# Patient Record
Sex: Female | Born: 1950
Health system: Southern US, Community
[De-identification: ages and names within clinical notes are randomized; demographics above are authoritative.]

## PROBLEM LIST (undated history)

## (undated) DIAGNOSIS — Z9221 Personal history of antineoplastic chemotherapy: Secondary | ICD-10-CM

## (undated) DIAGNOSIS — M1990 Unspecified osteoarthritis, unspecified site: Secondary | ICD-10-CM

## (undated) DIAGNOSIS — Z8619 Personal history of other infectious and parasitic diseases: Secondary | ICD-10-CM

## (undated) DIAGNOSIS — T7840XA Allergy, unspecified, initial encounter: Secondary | ICD-10-CM

## (undated) DIAGNOSIS — C50919 Malignant neoplasm of unspecified site of unspecified female breast: Secondary | ICD-10-CM

## (undated) DIAGNOSIS — Z923 Personal history of irradiation: Secondary | ICD-10-CM

## (undated) HISTORY — PX: EYE SURGERY: SHX253

## (undated) HISTORY — DX: Personal history of other infectious and parasitic diseases: Z86.19

## (undated) HISTORY — DX: Allergy, unspecified, initial encounter: T78.40XA

## (undated) HISTORY — PX: DILATION AND CURETTAGE OF UTERUS: SHX78

## (undated) HISTORY — DX: Unspecified osteoarthritis, unspecified site: M19.90

## (undated) HISTORY — PX: WISDOM TOOTH EXTRACTION: SHX21

---

## 1998-03-10 ENCOUNTER — Encounter: Admission: RE | Admit: 1998-03-10 | Discharge: 1998-03-10 | Payer: Self-pay | Admitting: Family Medicine

## 2001-02-27 ENCOUNTER — Other Ambulatory Visit: Admission: RE | Admit: 2001-02-27 | Discharge: 2001-02-27 | Payer: Self-pay | Admitting: *Deleted

## 2002-04-23 ENCOUNTER — Other Ambulatory Visit: Admission: RE | Admit: 2002-04-23 | Discharge: 2002-04-23 | Payer: Self-pay | Admitting: *Deleted

## 2003-06-03 ENCOUNTER — Other Ambulatory Visit: Admission: RE | Admit: 2003-06-03 | Discharge: 2003-06-03 | Payer: Self-pay | Admitting: *Deleted

## 2004-12-28 ENCOUNTER — Ambulatory Visit (HOSPITAL_COMMUNITY): Admission: RE | Admit: 2004-12-28 | Discharge: 2004-12-28 | Payer: Self-pay | Admitting: Gastroenterology

## 2011-09-03 ENCOUNTER — Encounter: Payer: Self-pay | Admitting: Internal Medicine

## 2011-09-03 ENCOUNTER — Ambulatory Visit (INDEPENDENT_AMBULATORY_CARE_PROVIDER_SITE_OTHER): Payer: BC Managed Care – PPO | Admitting: Internal Medicine

## 2011-09-03 VITALS — BP 118/78 | HR 107 | Temp 101.8°F | Ht 66.25 in | Wt 255.0 lb

## 2011-09-03 DIAGNOSIS — B9789 Other viral agents as the cause of diseases classified elsewhere: Secondary | ICD-10-CM

## 2011-09-03 DIAGNOSIS — B349 Viral infection, unspecified: Secondary | ICD-10-CM | POA: Insufficient documentation

## 2011-09-03 MED ORDER — AMOXICILLIN 500 MG PO CAPS: 1000.0000 mg | ORAL_CAPSULE | Freq: Two times a day (BID) | ORAL | Status: AC

## 2011-09-03 NOTE — Patient Instructions (Signed)
Rest, fluids , tylenol or motrin for fever or pain For cough, take Mucinex DM twice a day as needed  Take the antibiotic as prescribed...Marland KitchenMarland KitchenAmoxicillin Call if no better in few days Call anytime if the symptoms are severe

## 2011-09-03 NOTE — Assessment & Plan Note (Addendum)
Symptoms consistent with a viral syndrome, flu test negative. Recommend conservative care however patient strongly requests an antibiotic, states that historically she won't get better without them. See instructions

## 2011-09-03 NOTE — Progress Notes (Signed)
  Subjective:    Patient ID: Terri Mayer, female    DOB: [birth year], 60 y.o.   MRN: [id number]  HPI New patient Started to feel unwell 3 days ago, last night she got a low worse: Fever, chills, aches all over, global headache. Taking over-the-counter medicines with modest help, no appetite.  Past Medical History  Diagnosis Date  . Osteoarthritis   . History of chicken pox   . Allergy    Past Surgical History  Procedure Date  . Eye surgery at age 36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   Family History  Problem Relation Age of Onset  . Heart disease Father   . Diabetes      GM  . Colon cancer Neg Hx   . Breast cancer Neg Hx   . Dementia      M  . Transient ischemic attack      F   History   Social History  . Marital Status: Single    Spouse Name: N/A    Number of Children: 1  . Years of Education: N/A   Occupational History  . dentist    Social History Main Topics  . Smoking status: Never Smoker   . Smokeless tobacco: Never Used  . Alcohol Use: Yes     rarely   . Drug Use: Not on file  . Sexually Active: Not on file   Other Topics Concern  . Not on file   Social History Narrative   Single, adopted 1 chil     Review of Systems cough weight yellow sputum production. No chest pain or shortness of breath except for a burning feeling with cough located in the anterior chest (tracheal area) No vomiting or diarrhea No rash      Objective:   Physical Exam  Constitutional: She is oriented to person, place, and time. She appears well-developed.       Febrile, nontoxic  HENT:  Head: Normocephalic and atraumatic.  Right Ear: External ear normal.  Left Ear: External ear normal.  Mouth/Throat: Oropharyngeal exudate: no redness, no discharge, uvula midline, left tonsil slightly larger than right.       Face symmetric, no was quite congested, mild symmetric tenderness on all the sinus cavities  Cardiovascular: Normal rate, regular rhythm and normal heart sounds.     No murmur heard. Pulmonary/Chest: Effort normal and breath sounds normal. No respiratory distress. She has no wheezes. She has no rales.  Musculoskeletal: She exhibits no edema.  Neurological: She is alert and oriented to person, place, and time.          Assessment & Plan:

## 2015-10-25 ENCOUNTER — Other Ambulatory Visit (HOSPITAL_BASED_OUTPATIENT_CLINIC_OR_DEPARTMENT_OTHER): Payer: Self-pay | Admitting: Family Medicine

## 2015-10-25 DIAGNOSIS — Z1231 Encounter for screening mammogram for malignant neoplasm of breast: Secondary | ICD-10-CM

## 2015-11-02 ENCOUNTER — Ambulatory Visit (HOSPITAL_BASED_OUTPATIENT_CLINIC_OR_DEPARTMENT_OTHER): Payer: Self-pay

## 2015-11-02 ENCOUNTER — Ambulatory Visit (HOSPITAL_BASED_OUTPATIENT_CLINIC_OR_DEPARTMENT_OTHER)
Admission: RE | Admit: 2015-11-02 | Discharge: 2015-11-02 | Disposition: A | Discharge status: Home / Self Care | Payer: PPO | Source: Ambulatory Visit | Attending: Family Medicine | Admitting: Family Medicine

## 2015-11-02 DIAGNOSIS — R928 Other abnormal and inconclusive findings on diagnostic imaging of breast: Secondary | ICD-10-CM | POA: Diagnosis not present

## 2015-11-02 DIAGNOSIS — Z1231 Encounter for screening mammogram for malignant neoplasm of breast: Secondary | ICD-10-CM | POA: Insufficient documentation

## 2015-11-06 ENCOUNTER — Other Ambulatory Visit: Payer: Self-pay | Admitting: Family Medicine

## 2015-11-06 DIAGNOSIS — R928 Other abnormal and inconclusive findings on diagnostic imaging of breast: Secondary | ICD-10-CM

## 2015-11-14 ENCOUNTER — Other Ambulatory Visit: Payer: Self-pay | Admitting: Family Medicine

## 2015-11-14 ENCOUNTER — Ambulatory Visit
Admission: RE | Admit: 2015-11-14 | Discharge: 2015-11-14 | Disposition: A | Discharge status: Home / Self Care | Payer: PPO | Source: Ambulatory Visit | Attending: Family Medicine | Admitting: Family Medicine

## 2015-11-14 DIAGNOSIS — R928 Other abnormal and inconclusive findings on diagnostic imaging of breast: Secondary | ICD-10-CM

## 2015-11-14 DIAGNOSIS — N6310 Unspecified lump in the right breast, unspecified quadrant: Secondary | ICD-10-CM

## 2015-11-16 ENCOUNTER — Other Ambulatory Visit (HOSPITAL_COMMUNITY): Payer: Self-pay | Admitting: Radiology

## 2015-11-16 ENCOUNTER — Ambulatory Visit
Admission: RE | Admit: 2015-11-16 | Discharge: 2015-11-16 | Disposition: A | Discharge status: Home / Self Care | Payer: PPO | Source: Ambulatory Visit | Attending: Family Medicine | Admitting: Family Medicine

## 2015-11-16 DIAGNOSIS — N6310 Unspecified lump in the right breast, unspecified quadrant: Secondary | ICD-10-CM

## 2015-11-19 HISTORY — PX: BREAST LUMPECTOMY: SHX2

## 2015-11-20 ENCOUNTER — Other Ambulatory Visit: Payer: Self-pay | Admitting: Oncology

## 2015-11-21 ENCOUNTER — Telehealth: Payer: Self-pay | Admitting: Oncology

## 2015-11-21 NOTE — Telephone Encounter (Signed)
new patient appt-s/w patient and gave np appt for 01/12 @ 4 w/Dr. Doris Cheadle. Per md

## 2015-11-22 ENCOUNTER — Other Ambulatory Visit: Payer: Self-pay | Admitting: Oncology

## 2015-11-23 ENCOUNTER — Other Ambulatory Visit: Payer: Self-pay | Admitting: Oncology

## 2015-11-30 ENCOUNTER — Ambulatory Visit (HOSPITAL_BASED_OUTPATIENT_CLINIC_OR_DEPARTMENT_OTHER): Payer: PPO | Admitting: Oncology

## 2015-11-30 ENCOUNTER — Other Ambulatory Visit (HOSPITAL_BASED_OUTPATIENT_CLINIC_OR_DEPARTMENT_OTHER): Payer: PPO

## 2015-11-30 ENCOUNTER — Other Ambulatory Visit: Payer: Self-pay | Admitting: *Deleted

## 2015-11-30 VITALS — BP 144/66 | HR 76 | Temp 99.1°F | Resp 19 | Ht 66.0 in | Wt 253.1 lb

## 2015-11-30 DIAGNOSIS — C50919 Malignant neoplasm of unspecified site of unspecified female breast: Secondary | ICD-10-CM

## 2015-11-30 DIAGNOSIS — Z803 Family history of malignant neoplasm of breast: Secondary | ICD-10-CM

## 2015-11-30 DIAGNOSIS — C50211 Malignant neoplasm of upper-inner quadrant of right female breast: Secondary | ICD-10-CM | POA: Insufficient documentation

## 2015-11-30 DIAGNOSIS — E669 Obesity, unspecified: Secondary | ICD-10-CM

## 2015-11-30 DIAGNOSIS — Z171 Estrogen receptor negative status [ER-]: Secondary | ICD-10-CM

## 2015-11-30 DIAGNOSIS — R002 Palpitations: Secondary | ICD-10-CM

## 2015-11-30 DIAGNOSIS — Z801 Family history of malignant neoplasm of trachea, bronchus and lung: Secondary | ICD-10-CM | POA: Diagnosis not present

## 2015-11-30 DIAGNOSIS — C50511 Malignant neoplasm of lower-outer quadrant of right female breast: Secondary | ICD-10-CM | POA: Diagnosis not present

## 2015-11-30 DIAGNOSIS — C773 Secondary and unspecified malignant neoplasm of axilla and upper limb lymph nodes: Secondary | ICD-10-CM | POA: Diagnosis not present

## 2015-11-30 LAB — COMPREHENSIVE METABOLIC PANEL
ALBUMIN: 3.8 g/dL (ref 3.5–5.0)
ALT: 17 U/L (ref 0–55)
ANION GAP: 6 meq/L (ref 3–11)
AST: 19 U/L (ref 5–34)
Alkaline Phosphatase: 63 U/L (ref 40–150)
BILIRUBIN TOTAL: 0.96 mg/dL (ref 0.20–1.20)
BUN: 17.7 mg/dL (ref 7.0–26.0)
CALCIUM: 9.5 mg/dL (ref 8.4–10.4)
CHLORIDE: 107 meq/L (ref 98–109)
CO2: 29 mEq/L (ref 22–29)
CREATININE: 0.8 mg/dL (ref 0.6–1.1)
EGFR: 77 mL/min/{1.73_m2} — ABNORMAL LOW (ref >90)
Glucose: 84 mg/dl (ref 70–140)
Potassium: 4.7 mEq/L (ref 3.5–5.1)
Sodium: 142 mEq/L (ref 136–145)
TOTAL PROTEIN: 6.9 g/dL (ref 6.4–8.3)

## 2015-11-30 LAB — CBC WITH DIFFERENTIAL/PLATELET
BASO%: 0.9 % (ref 0.0–2.0)
Basophils Absolute: 0.1 10*3/uL (ref 0.0–0.1)
EOS%: 2.5 % (ref 0.0–7.0)
Eosinophils Absolute: 0.3 10*3/uL (ref 0.0–0.5)
HEMATOCRIT: 42.7 % (ref 34.8–46.6)
HEMOGLOBIN: 13.9 g/dL (ref 11.6–15.9)
LYMPH#: 2 10*3/uL (ref 0.9–3.3)
LYMPH%: 20.1 % (ref 14.0–49.7)
MCH: 26.2 pg (ref 25.1–34.0)
MCHC: 32.7 g/dL (ref 31.5–36.0)
MCV: 80.3 fL (ref 79.5–101.0)
MONO#: 1.2 10*3/uL — AB (ref 0.1–0.9)
MONO%: 11.5 % (ref 0.0–14.0)
NEUT#: 6.6 10*3/uL — ABNORMAL HIGH (ref 1.5–6.5)
NEUT%: 65 % (ref 38.4–76.8)
PLATELETS: 338 10*3/uL (ref 145–400)
RBC: 5.31 10*6/uL (ref 3.70–5.45)
RDW: 13.9 % (ref 11.2–14.5)
WBC: 10.2 10*3/uL (ref 3.9–10.3)

## 2015-11-30 NOTE — Progress Notes (Signed)
[place]  Telephone:([phone] Fax:([phone]) [phone]     ID: KRISTAIN FILO DOB: 03-18-1951  MR#: [id number]  GHW#:[id number]  Patient Care Team: Bernerd Limbo, MD as PCP - General (Family Medicine) Chauncey Cruel, MD as Consulting Physician (Oncology) Rolm Bookbinder, MD as Consulting Physician (General Surgery) Thea Silversmith, MD as Consulting Physician (Radiation Oncology) Princess Bruins, MD as Consulting Physician (Obstetrics and Gynecology) PCP: Phineas Inches, MD GYN: SU:  OTHER MD:  CHIEF COMPLAINT:   CURRENT TREATMENT:    BREAST CANCER HISTORY: "Terri Mayer" underwent bilateral screening mammography with tomosynthesis at the breast center 11/02/2015 showing a possible mass in the right breast and low right axillary area. She was called back for right diagnostic mammography with tomosynthesis and right ultrasonography 11/14/2015. The breast density was category B. There was a lobulated mass in the lateral right breast, with a smaller oval mass immediately adjacent to it. Also in the upper outer quadrant there was a third mass. This has been stable back to 2014 and is felt to be a fibroadenoma.  Ultrasound of the right breast confirmed a hypoechoic mass at the 3:00 position 6 cm from the nipple, measuring 1.4 cm. Adjacent to this there was a 0.5 cm internal mammary lymph node, with normal morphology and normal hilar fat. The mass at the 10:00 position measured 0.8 cm and again was consistent with a fibroadenoma. In the right axilla there was an abnormal lymph node with no internal fat measuring 1.8 cm. The rest of the exam was unremarkable.  Biopsy of the right breast 8:30 o'clock lesion and the right axillary lymph node on 11/16/2015 showed ([id number]) both to be positive for invasive ductal carcinoma, grade 3, the breast mass being estrogen receptor 10% positive with moderate staining intensity, but progesterone receptor negative, but the lymph node being  estrogen and progesterone both negative. Both the breast and lymph nodes had MIB-1 of 30%. Both were positive for HER-2 amplification, the signals ratio is being 6.06 and 7.61, and the number per cell 10.00 and 11.80.  The patient's subsequent history is as detailed below   INTERVAL HISTORY: K was evaluated in the breast clinic 11/30/2015 accompanied by her cousin "Smac" and her son [name]. Her case was also presented in the multidisciplinary breast cancer conference 11/29/2015. At that time a preliminary plan was proposed: Neoadjuvant chemotherapy/immunotherapy, to be followed by breast conserving surgery and sentinel lymph node sampling. It was felt that if the patient wanted breast conservation the internal mammary lymph node should be biopsied. An MRI also was suggested pretreatment  REVIEW OF SYSTEMS: There were no specific symptoms leading to the original mammogram, which was routinely scheduled though several months late because the patient had been busy with the process of retirement. Terri Mayer denies unusual headaches, visual changes, nausea, vomiting, stiff neck, dizziness, or gait imbalance. There has been no cough, phlegm production, or pleurisy, no chest pain or pressure, and no change in bowel or bladder habits. The patient denies fever, rash, bleeding, unexplained fatigue or unexplained weight loss. she admits to rare ankle swelling. She has a large toenail that will need repair at some point and she complains of problems with insomnia. Occasionally she will feel her heart races a little, but she admits to being a "coca-holic". A detailed review of systems was otherwise entirely negative.   PAST MEDICAL HISTORY: Past Medical History  Diagnosis Date  . Osteoarthritis   . History of chicken pox   . Allergy     PAST  SURGICAL HISTORY: Past Surgical History  Procedure Laterality Date  . Eye surgery  at age 65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    FAMILY HISTORY Family History  Problem  Relation Age of Onset  . Heart disease Father   . Diabetes      GM  . Colon cancer Neg Hx   . Breast cancer Neg Hx   . Dementia      M  . Transient ischemic attack      F   the patient's father died after multiple strokes at the age of 34. The patient's mother died at the age of 31 with Alzheimer's disease. The patient had one brother, one sister. The patient's paternal grandfather died from lung cancer in his 64s. On the mother's side there are 2 (second) cousins with breast cancer diagnosed in their early 31s, and a more remote cousin diagnosed with breast cancer at age 18. There is no history of ovarian cancer.   GYNECOLOGIC HISTORY:  No LMP recorded. Patient is postmenopausal.  menarche age 65, the patient is GX P0 and she understands that not carrying a child to term before the age of 37 essentially doubled the risk of breast cancer. She stopped having periods between age 15 and 58 and did not take hormone replacement. 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 She lives by herself, with her pet dog. Her son, adopted at birth, Yaiza Palazzola, currently 20, attends [employer] in the psychology Department.     ADVANCED DIRECTIVES: Not in place. At the 11/30/2015 clinic visit the patient was given the appropriate forms to complete and notarize at her discretion.    HEALTH MAINTENANCE: Social History  Substance Use Topics  . Smoking status: Never Smoker   . Smokeless tobacco: Never Used  . Alcohol Use: Yes     Comment: rarely      Colonoscopy:   PAP:  Bone density:  Lipid panel:  Allergies  Allergen Reactions  . Codeine Nausea And Vomiting    Current Outpatient Prescriptions  Medication Sig Dispense Refill  . ascorbic acid (VITAMIN C) 500 MG tablet Take 500 mg by mouth daily.    . cholecalciferol (VITAMIN D) 1000 units tablet Take 1,000 Units  by mouth daily.    . cyclobenzaprine (FLEXERIL) 5 MG tablet Take 5 mg by mouth 3 (three) times daily as needed for muscle spasms. Reported on 11/30/2015    . IBUPROFEN PO Take by mouth daily.      . Multiple Vitamin (MULTIVITAMIN) tablet Take 1 tablet by mouth daily.       No current facility-administered medications for this visit.    OBJECTIVE: Middle-aged white woman in no acute distress  Filed Vitals:   11/30/15 1613  BP: 144/66  Pulse: 76  Temp: 99.1 F (37.3 C)  Resp: 19     Body mass index is 40.87 kg/(m^2).    ECOG FS:0 - Asymptomatic  Ocular: Sclerae unicteric, pupils equal, round and reactive to light Ear-nose-throat: Oropharynx clear and moist Lymphatic: No cervical or supraclavicular adenopathy Lungs no rales or rhonchi, good excursion bilaterally Heart regular rate and rhythm, no murmur appreciated Abd soft, obese, nontender, positive bowel sounds MSK no focal spinal tenderness, no joint edema Neuro: non-focal, well-oriented, appropriate affect Breasts: The right breast is status post recent biopsy. I do not palpate a  mass. There are no skin or nipple changes of concern. The right axilla is benign. Left breast is unremarkable.   LAB RESULTS:  CMP     Component Value Date/Time   NA 142 11/30/2015 1557   K 4.7 11/30/2015 1557   CO2 29 11/30/2015 1557   GLUCOSE 84 11/30/2015 1557   BUN 17.7 11/30/2015 1557   CREATININE 0.8 11/30/2015 1557   CALCIUM 9.5 11/30/2015 1557   PROT 6.9 11/30/2015 1557   ALBUMIN 3.8 11/30/2015 1557   AST 19 11/30/2015 1557   ALT 17 11/30/2015 1557   ALKPHOS 63 11/30/2015 1557   BILITOT 0.96 11/30/2015 1557    INo results found for: SPEP, UPEP  Lab Results  Component Value Date   WBC 10.2 11/30/2015   NEUTROABS 6.6* 11/30/2015   HGB 13.9 11/30/2015   HCT 42.7 11/30/2015   MCV 80.3 11/30/2015   PLT 338 11/30/2015      Chemistry      Component Value Date/Time   NA 142 11/30/2015 1557   K 4.7 11/30/2015 1557   CO2 29  11/30/2015 1557   BUN 17.7 11/30/2015 1557   CREATININE 0.8 11/30/2015 1557      Component Value Date/Time   CALCIUM 9.5 11/30/2015 1557   ALKPHOS 63 11/30/2015 1557   AST 19 11/30/2015 1557   ALT 17 11/30/2015 1557   BILITOT 0.96 11/30/2015 1557       No results found for: LABCA2  No components found for: LABCA125  No results for input(s): INR in the last 168 hours.  Urinalysis No results found for: COLORURINE, APPEARANCEUR, LABSPEC, Bivalve, GLUCOSEU, HGBUR, BILIRUBINUR, KETONESUR, PROTEINUR, UROBILINOGEN, NITRITE, LEUKOCYTESUR  STUDIES: Mm Digital Diagnostic Unilat R  11/16/2015  CLINICAL DATA:  Post right breast and right axillary lymph node ultrasound-guided core biopsies. EXAM: DIAGNOSTIC RIGHT MAMMOGRAM POST ULTRASOUND BIOPSY COMPARISON:  Previous exam(s). FINDINGS: Mammographic images were obtained following ultrasound guided biopsy of the mass located within the right breast at the 8:30 o'clock position as well as the enlarged right axillary lymph node. The ribbon shaped clip is in appropriate position associated with the mass located within the right breast at the 8:30 o'clock position. The spiral shaped clip is in appropriate position associated with the right axillary lymph node as visualized in the MLO projection. IMPRESSION: Appropriate positioning of clips following right breast and right axillary lymph node ultrasound guided core biopsies. Final Assessment: Post Procedure Mammograms for Marker Placement Electronically Signed   By: Altamese Cabal M.D.   On: 11/16/2015 10:21   US Breast Ltd Uni Right Inc Axilla  11/14/2015  CLINICAL DATA:  Screening recall for a new right breast mass as well as an abnormal axillary lymph node. EXAM: DIGITAL DIAGNOSTIC RIGHT MAMMOGRAM WITH 3D TOMOSYNTHESIS WITH CAD ULTRASOUND RIGHT BREAST COMPARISON:  Previous exam(s). ACR Breast Density Category b: There are scattered areas of fibroglandular density. FINDINGS: On spot compression  imaging, the mass in the lateral right breast persists. It has lobulated partly indistinct margins. There is a smaller oval mass adjacent to this. Also, in the upper outer quadrant of the right breast, more anteriorly, there is an oval mass. This mass has been stable dating back to the oldest available study, which is 05/14/2013. The right axilla, there is an abnormal enlarged rounded lymph node. Mammographic images were processed with CAD. On physical exam, there is small mobile lobular mass in the lateral right breast in the 8:30 o'clock position. Targeted ultrasound is performed, showing a hypoechoic mass with lobulated  partly ill-defined margins in the 3 o'clock position, 6 cm the nipple, measuring 1.4 x 1.2 x 1.4 cm. Mass is vascular. Adjacent to this mass is in lymph node measuring 5 mm in short axis with a cortical thickness of between 2 and 3 mm. It has normal morphology normal hilar fat. In the 10 o'clock position of the right breast, 4 cm the nipple, there is a hypoechoic circumscribed mass, oriented parallel to the skin, measuring 7 x 4 x 8 mm, consistent with the stable mass seen mammographically. This is most likely a fibroadenoma. In the right axilla there is an abnormal lymph node, enlarged, with no internal fat. It measures 1.8 x 1.5 x 1.8 cm. No other enlarged or abnormal lymph nodes are seen in the right axilla. IMPRESSION: 1. Right breast mass measuring 1.4 cm in size in 8:30 o'clock position is highly suspicious for a primary breast malignancy. Directly adjacent mass is a lymph node, although normal in size and morphology, has increased in size mammographically and is therefore concerning for metastatic adenopathy. The lymph node is within 1 cm of primary mass. 2. Abnormal, enlarged right axillary lymph node likely a metastatic node. 3. 8 mm benign-appearing mass in the 10 o'clock position of the right breast, stable mammographically for over 2 years. This is felt to be a benign lesion most  likely a fibroadenoma. RECOMMENDATION: 1. Ultrasound-guided core needle biopsy of 830 o'clock right breast mass and of the right axillary abnormal lymph node. I have discussed the findings and recommendations with the patient. Results were also provided in writing at the conclusion of the visit. If applicable, a reminder letter will be sent to the patient regarding the next appointment. BI-RADS CATEGORY  5: Highly suggestive of malignancy. Electronically Signed   By: Lajean Manes M.D.   On: 11/14/2015 12:35   Mm Diag Breast Tomo Uni Right  11/14/2015  CLINICAL DATA:  Screening recall for a new right breast mass as well as an abnormal axillary lymph node. EXAM: DIGITAL DIAGNOSTIC RIGHT MAMMOGRAM WITH 3D TOMOSYNTHESIS WITH CAD ULTRASOUND RIGHT BREAST COMPARISON:  Previous exam(s). ACR Breast Density Category b: There are scattered areas of fibroglandular density. FINDINGS: On spot compression imaging, the mass in the lateral right breast persists. It has lobulated partly indistinct margins. There is a smaller oval mass adjacent to this. Also, in the upper outer quadrant of the right breast, more anteriorly, there is an oval mass. This mass has been stable dating back to the oldest available study, which is 05/14/2013. The right axilla, there is an abnormal enlarged rounded lymph node. Mammographic images were processed with CAD. On physical exam, there is small mobile lobular mass in the lateral right breast in the 8:30 o'clock position. Targeted ultrasound is performed, showing a hypoechoic mass with lobulated partly ill-defined margins in the 3 o'clock position, 6 cm the nipple, measuring 1.4 x 1.2 x 1.4 cm. Mass is vascular. Adjacent to this mass is in lymph node measuring 5 mm in short axis with a cortical thickness of between 2 and 3 mm. It has normal morphology normal hilar fat. In the 10 o'clock position of the right breast, 4 cm the nipple, there is a hypoechoic circumscribed mass, oriented parallel to  the skin, measuring 7 x 4 x 8 mm, consistent with the stable mass seen mammographically. This is most likely a fibroadenoma. In the right axilla there is an abnormal lymph node, enlarged, with no internal fat. It measures 1.8 x 1.5 x 1.8 cm. No  other enlarged or abnormal lymph nodes are seen in the right axilla. IMPRESSION: 1. Right breast mass measuring 1.4 cm in size in 8:30 o'clock position is highly suspicious for a primary breast malignancy. Directly adjacent mass is a lymph node, although normal in size and morphology, has increased in size mammographically and is therefore concerning for metastatic adenopathy. The lymph node is within 1 cm of primary mass. 2. Abnormal, enlarged right axillary lymph node likely a metastatic node. 3. 8 mm benign-appearing mass in the 10 o'clock position of the right breast, stable mammographically for over 2 years. This is felt to be a benign lesion most likely a fibroadenoma. RECOMMENDATION: 1. Ultrasound-guided core needle biopsy of 830 o'clock right breast mass and of the right axillary abnormal lymph node. I have discussed the findings and recommendations with the patient. Results were also provided in writing at the conclusion of the visit. If applicable, a reminder letter will be sent to the patient regarding the next appointment. BI-RADS CATEGORY  5: Highly suggestive of malignancy. Electronically Signed   By: Lajean Manes M.D.   On: 11/14/2015 12:35   Mm Screening Breast Tomo Bilateral  11/03/2015  CLINICAL DATA:  Screening. EXAM: DIGITAL SCREENING BILATERAL MAMMOGRAM WITH 3D TOMO WITH CAD COMPARISON:  Previous exam(s). ACR Breast Density Category b: There are scattered areas of fibroglandular density. FINDINGS: In the right breast, possible masses and low right axillary lymph node warrant further evaluation. In the left breast, no findings suspicious for malignancy. Images were processed with CAD. IMPRESSION: Further evaluation is suggested for possible masses and  right axillary lymph node in the right breast. RECOMMENDATION: Diagnostic mammogram and possibly ultrasound of the right breast. (Code:[id number]) The patient will be contacted regarding the findings, and additional imaging will be scheduled. BI-RADS CATEGORY  0: Incomplete. Need additional imaging evaluation and/or prior mammograms for comparison. Electronically Signed   By: Fidela Salisbury M.D.   On: 11/03/2015 10:18   [place] Rt Breast Bx W Loc Dev 1st Lesion Img Bx Spec US Guide  11/17/2015  ADDENDUM REPORT: 11/17/2015 12:10 ADDENDUM: Pathology reveals Grade III INVASIVE DUCTAL CARCINOMA of the Right breast at the 8:00 o'clock location. METASTATIC DUCTAL CARCINOMA of the Right Axillary Lymph node. This was found to be concordant by Dr. Luberta Robertson. Pathology results were discussed with the patient via telephone. She reported no problems with the biopsy site and is doing well. Post biopsy care and instructions were reviewed and her questions were answered. She was encouraged to contact [facility] with any additional questions and or concerns. A surgical referral was arranged with Dr. Rolm Bookbinder, (per patient request), of [facility] on December 08, 2015. A medical oncology referral is being arranged by Bary Castilla RN, Nurse [place], with Dr. Lurline Del of [facility] Pathology results reported by Terie Purser RN on November 17, 2015. Electronically Signed   By: Altamese Cabal M.D.   On: 11/17/2015 12:10  11/17/2015  CLINICAL DATA:  Right breast mass. EXAM: ULTRASOUND GUIDED RIGHT BREAST CORE NEEDLE BIOPSY COMPARISON:  Previous exam(s). FINDINGS: I met with the patient and we discussed the procedure of ultrasound-guided biopsy, including benefits and alternatives. We discussed the high likelihood of a successful procedure. We discussed the risks of the procedure, including infection, bleeding, tissue  injury, clip migration, and inadequate sampling. Informed written consent was given. The usual time-out protocol was performed immediately prior to the procedure. Using sterile technique and  2% Lidocaine as local anesthetic, under direct ultrasound visualization, a 12 gauge spring-loaded device was used to perform biopsy of the mass located within the right breast at the 8:30 o'clock position using a lateral/inferior approach. At the conclusion of the procedure a ribbon shaped tissue marker clip was deployed into the biopsy cavity. Follow up 2 view mammogram was performed and dictated separately. IMPRESSION: Ultrasound guided biopsy of the right breast mass located at the 8:30 o'clock position as discussed above. No apparent complications. Electronically Signed: By: Altamese Cabal M.D. On: 11/16/2015 10:16   [place] Rt Breast Bx W Loc Dev Ea Add Lesion Img Bx Spec US Guide  11/17/2015  ADDENDUM REPORT: 11/17/2015 12:11 ADDENDUM: Pathology reveals Grade III INVASIVE DUCTAL CARCINOMA of the Right breast at the 8:00 o'clock location. METASTATIC DUCTAL CARCINOMA of the Right Axillary Lymph node. This was found to be concordant by Dr. Luberta Robertson. Pathology results were discussed with the patient via telephone. She reported no problems with the biopsy site and is doing well. Post biopsy care and instructions were reviewed and her questions were answered. She was encouraged to contact [facility] with any additional questions and or concerns. A surgical referral was arranged with Dr. Rolm Bookbinder, (per patient request), of [facility] on December 08, 2015. A medical oncology referral is being arranged by Bary Castilla RN, Nurse [place], with Dr. Lurline Del of [facility] Pathology results reported by Terie Purser RN on November 17, 2015. Electronically Signed   By: Altamese Cabal M.D.   On: 11/17/2015  12:11  11/17/2015  CLINICAL DATA:  Right breast mass with enlarged right axillary lymph node. EXAM: ULTRASOUND GUIDED RIGHT AXILLARY LYMPH NODE CORE NEEDLE BIOPSY COMPARISON:  Previous exam(s). FINDINGS: I met with the patient and we discussed the procedure of ultrasound-guided biopsy, including benefits and alternatives. We discussed the high likelihood of a successful procedure. We discussed the risks of the procedure, including infection, bleeding, tissue injury, clip migration, and inadequate sampling. Informed written consent was given. The usual time-out protocol was performed immediately prior to the procedure. Using sterile technique and 2% Lidocaine as local anesthetic, under direct ultrasound visualization, a 14 gauge spring-loaded device was used to perform biopsy of the enlarged right axillary lymph node using a lateral approach. At the conclusion of the procedure a spiral shaped HydroMARK tissue marker clip was deployed into the biopsy cavity. Follow up 2 view mammogram was performed and dictated separately. IMPRESSION: Ultrasound guided biopsy of the enlarged right axillary lymph node as discussed above. No apparent complications. Electronically Signed: By: Altamese Cabal M.D. On: 11/16/2015 10:19    ASSESSMENT: 65 y.o.  woman status post right breast lower outer quadrant and right axillary lymph node biopsy 11/16/2015, also positive for a clinical  T1c pN1, stage IIA invasive ductal carcinoma grade 3, essentially estrogen and progesterone receptor negative but HER-2 amplified, with an MIB-1 of 30%  (a) the breast lesion had moderate positivity for the estrogen receptor at 10%; the axillary lymph node lesion was estrogen receptor negative. Both were progesterone receptor negative  (1) neoadjuvant chemotherapy with carboplatin, docetaxel, trastuzumab and pertuzumab for 6 cycles, target start date 12/18/2015   (2) trastuzumab to be continued to total one year  (3) breast  conserving surgery with consideration of the Alliance trial to follow  (4) adjuvant radiation as appropriate   PLAN: We spent the better part of today's hour-long appointment discussing the biology  of breast cancer in general, and the specifics of the patient's tumor in particular. Terri Mayer understands the difference between local and systemic treatments for breast cancer. In terms of local treatment, she understands there is no survival advantage to mastectomy as compared to lumpectomy plus radiation and also no survival advantage to doing chemotherapy before surgery or surgery before chemotherapy.  In her case we feel starting with chemotherapy will be helpful because it will make the surgery easier and it will also give her prognostic information: About 40% of patients like her will obtain a complete pathologic response and those patients tend to do very well long term.  As far as systemic therapy is concerned she understands that while the breast tumor prognostic panel showed moderate estrogen receptor positivity, the axillary lymph node was negative. Both tumors were negative for the progesterone receptor. Accordingly while she may drive some benefit from antiestrogen's at some point the main risk reduction in her case will come from anti-HER-2 immunotherapy and chemotherapy.  We use a combination of carboplatin and docetaxel given every 3 weeks apart for 6 cycles as the chemotherapy for patients in her situation. She will also receive trastuzumab and pertuzumab every 3 weeks, starting at the same time, and the trastuzumab will be continued to complete 1 year  We discussed the possible toxicities, side effects and complications of these agents at length today.  Terri Mayer will need an echocardiogram as well as an initial breast MRI. She will see Dr. Donne Hazel 12/01/2015 to discuss port placement and whether he feels proceeding to biopsy of the intramammary lymph node immediately adjacent to the breast tumor  mass is warranted. Terri Mayer will also see Dr. Haroldine Laws who will interpret her echocardiograms and Dr. Pablo Ledger who will guide her adjuvant radiation treatment. All these appointments are being operationalized and we are hoping to be able to start chemotherapy January 30.  Terri Mayer has a good understanding of the overall plan. She agrees with it. She knows the goal of treatment in her case is cure. She will call with any problems that may develop before her next visit here.  Chauncey Cruel, MD   11/30/2015 5:57 PM Medical Oncology and Hematology [facility] [address] [place], [place] [address] Tel. [phone]    Fax. [phone]

## 2015-12-01 ENCOUNTER — Other Ambulatory Visit: Payer: Self-pay | Admitting: Oncology

## 2015-12-01 DIAGNOSIS — Z139 Encounter for screening, unspecified: Secondary | ICD-10-CM

## 2015-12-03 ENCOUNTER — Telehealth: Payer: Self-pay | Admitting: Oncology

## 2015-12-03 NOTE — Telephone Encounter (Signed)
Spoke with patient and she is aware of her appointments and will get a new schedule as well 1/19

## 2015-12-04 ENCOUNTER — Ambulatory Visit
Admission: RE | Admit: 2015-12-04 | Discharge: 2015-12-04 | Disposition: A | Discharge status: Home / Self Care | Payer: PPO | Source: Ambulatory Visit | Attending: Oncology | Admitting: Oncology

## 2015-12-04 DIAGNOSIS — Z139 Encounter for screening, unspecified: Secondary | ICD-10-CM

## 2015-12-04 DIAGNOSIS — C50911 Malignant neoplasm of unspecified site of right female breast: Secondary | ICD-10-CM | POA: Diagnosis not present

## 2015-12-04 DIAGNOSIS — C50211 Malignant neoplasm of upper-inner quadrant of right female breast: Secondary | ICD-10-CM

## 2015-12-04 DIAGNOSIS — Z0389 Encounter for observation for other suspected diseases and conditions ruled out: Secondary | ICD-10-CM | POA: Diagnosis not present

## 2015-12-04 DIAGNOSIS — R002 Palpitations: Secondary | ICD-10-CM

## 2015-12-04 DIAGNOSIS — C50511 Malignant neoplasm of lower-outer quadrant of right female breast: Secondary | ICD-10-CM | POA: Diagnosis not present

## 2015-12-04 MED ORDER — GADOBENATE DIMEGLUMINE 529 MG/ML IV SOLN
20.0000 mL | Freq: Once | INTRAVENOUS | Status: AC | PRN
  Administered 2015-12-04: 20 mL via INTRAVENOUS

## 2015-12-04 NOTE — Progress Notes (Signed)
Location of Breast Cancer: Invasive Ductal Carcinoma of Right Breast  Histology per Pathology Report:   Receptor Status: ER(10%+), PR (0%-), Her2-neu (+)  Did patient present with symptoms (if so, please note symptoms) or was this found on screening mammography?: mammogram screening  Past/Anticipated interventions by surgeon, if any:  Past/Anticipated interventions by medical oncology, if any: Dr. Jana Hakim: 11/30/15: neoadjuvant chemotherapy with carboplatin, docetaxel, trastuzumab and pertuzumab for 6 cycles, target start date 12/18/2015    Lymphedema issues, if any:No  Pain issues, if any:  None to right breast,some to neck,back and shoulders level 2 taking Ibuprofen with relief.  SAFETY ISSUES:  Prior radiation? no  Pacemaker/ICD? no  Possible current pregnancy?no  Is the patient on methotrexate? 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 Her son, adopted at birth. On the mother's side there are 2 (second) cousins with breast cancer diagnosed in their early 34s, and a more remote cousin diagnosed with breast cancer at age 67  GYN/ObGYN:  No LMP recorded. Patient is postmenopausal. Menarche age 1, the patient is GX P0 and she understands that not carrying a child to term before the age of 47 essentially doubled the risk of breast cancer. She stopped having periods between age 54 and 25 and did not take hormone replacement. She tried oral contraceptives remotely but "felt strange" in those and took them at most for a few months   Had a MRI of breast 12-04-15    Terri Slicker, RN 12/04/2015,4:19 PM

## 2015-12-06 ENCOUNTER — Encounter: Payer: Self-pay | Admitting: Radiation Oncology

## 2015-12-06 DIAGNOSIS — Z01419 Encounter for gynecological examination (general) (routine) without abnormal findings: Secondary | ICD-10-CM | POA: Diagnosis not present

## 2015-12-07 ENCOUNTER — Ambulatory Visit
Admission: RE | Admit: 2015-12-07 | Discharge: 2015-12-07 | Disposition: A | Discharge status: Home / Self Care | Payer: PPO | Source: Ambulatory Visit | Attending: Radiation Oncology | Admitting: Radiation Oncology

## 2015-12-07 ENCOUNTER — Other Ambulatory Visit: Payer: Self-pay | Admitting: General Surgery

## 2015-12-07 ENCOUNTER — Encounter: Payer: Self-pay | Admitting: Radiation Oncology

## 2015-12-07 ENCOUNTER — Encounter: Payer: Self-pay | Admitting: *Deleted

## 2015-12-07 VITALS — BP 109/53 | HR 17 | Temp 99.4°F | Ht 66.0 in | Wt 259.2 lb

## 2015-12-07 DIAGNOSIS — Z170 Estrogen receptor positive status [ER+]: Secondary | ICD-10-CM | POA: Insufficient documentation

## 2015-12-07 DIAGNOSIS — C50211 Malignant neoplasm of upper-inner quadrant of right female breast: Secondary | ICD-10-CM | POA: Diagnosis not present

## 2015-12-07 HISTORY — DX: Malignant neoplasm of unspecified site of unspecified female breast: C50.919

## 2015-12-07 NOTE — Progress Notes (Signed)
Radiation Oncology         [phone]) [phone] ________________________________  Initial Outpatient Consultation - Date: 12/07/2015   Name: Terri Mayer MRN: [id number]   DOB: 1951-04-11  REFERRING PHYSICIAN: Magrinat, Virgie Dad, MD  DIAGNOSIS AND STAGE: Breast cancer of upper-inner quadrant of right female breast [facility])   Staging form: Breast, AJCC 7th Edition     Clinical: Stage IIA (T1c, N1, M0) - Signed by Chauncey Cruel, MD on 11/30/2015  HISTORY OF PRESENT ILLNESS::Terri Mayer is a 64 y.o. female that presents to the clinic with an abnormal mammogram on 11/02/2015. This showed a possible mass in the right breast and low right axillary area. She had an ultrasound on 11/14/2015 which showed the breast density was category B. There was a lobulated mass in the lateral right breast, with a small oval mass immediately adjacent to it. In the upper-outer quadrant there was a third mass. This mass has been stable back to 2014 and is felt to be a fibroadenoma. Ultrasound confirmed a mass at the 3:00 position 6 cm from the nipple, measuring 1.4 cm. Adjacent to this was a 0.5 cm intramammary lymph node. That mass at the 10:00 position measured 0.8 cm and was consistent with a fibroadenoma. In the right axilla there was an abnormal lymph node measuring 1.8 cm that was positive. The biopsy of the right breast at the 8:30 lesion and the right axillary lymph node on 11/16/2015 showed both to be positive for invasive ductal carcinoma, grade 3, ER positive 10%, PR negative, and Her2-neu positive. The lymph node was both ER and PR negative and Her2-neu positive.  PREVIOUS RADIATION THERAPY: No  Past medical, social and family history were reviewed in the electronic chart. Review of symptoms was reviewed in the electronic chart. Medications were reviewed in the electronic chart.   PHYSICAL EXAM:  Filed Vitals:   12/07/15 1332  BP: 109/53  Pulse: 17  Temp: 99.4 F (37.4 C)  .259 lb 3.2 oz (117.572  kg). Palpable area of thickening in upper outer quadrant of right breast. Palpable mobile right axillary lymph node. No palpable abnormalities of left breast or left axilla. Alert and oriented x3.  IMPRESSION: Terri Mayer is a 65 yo female with Stage IIA breast cancer of the upper-inner quadrant of the right breast.  PLAN: She will proceed on with neoadjuvant therapy.  We discussed the role of radation to the breast and the possibility of avoiding axillary surgery on a clinical trial. We discussed the retrospective data showing an increase in failure rates in patients who have a pathologic complete response and did not undergo radiation. For this reason I have recommended radiation to the whole breast followed by boost to the tumor bed. We discussed the process of simulation the placement tattoos. We discussed possible side effects during treatment including but not limited to skin irritation darkness and fatigue. We discussed long-term effects of treatment which are extremely unlikely but possible including damage to the lungs and ribs. We discussed the low likelihood of secondary malignancies.   She will complete chemotherapy, undergo surgery and then radiation as appropriate.  She has an appointment with Dr. Donne Hazel on 12/08/2015. The breast cancer navigators were able to meet with her today. She was given information on CT simulation.  I spent 40 minutes  face to face with the patient and more than 50% of that time was spent in counseling and/or coordination of care.   ------------------------------------------------  Terri Silversmith, MD  This document serves as a record of services personally performed by Terri Silversmith, MD. It was created on her behalf by  Lendon Collar, a trained medical scribe. The creation of this record is based on the scribe's personal observations and the provider's statements to them. This document has been checked and approved by the attending provider.

## 2015-12-08 ENCOUNTER — Encounter (HOSPITAL_BASED_OUTPATIENT_CLINIC_OR_DEPARTMENT_OTHER): Payer: Self-pay | Admitting: *Deleted

## 2015-12-08 DIAGNOSIS — C50911 Malignant neoplasm of unspecified site of right female breast: Secondary | ICD-10-CM | POA: Diagnosis not present

## 2015-12-11 ENCOUNTER — Encounter (HOSPITAL_BASED_OUTPATIENT_CLINIC_OR_DEPARTMENT_OTHER): Payer: Self-pay | Admitting: Certified Registered"

## 2015-12-11 ENCOUNTER — Other Ambulatory Visit: Payer: PPO

## 2015-12-11 ENCOUNTER — Encounter (HOSPITAL_BASED_OUTPATIENT_CLINIC_OR_DEPARTMENT_OTHER)
Admission: RE | Disposition: A | Discharge status: Home / Self Care | Payer: Self-pay | Source: Ambulatory Visit | Attending: General Surgery

## 2015-12-11 ENCOUNTER — Ambulatory Visit (HOSPITAL_COMMUNITY): Payer: PPO

## 2015-12-11 ENCOUNTER — Ambulatory Visit (HOSPITAL_BASED_OUTPATIENT_CLINIC_OR_DEPARTMENT_OTHER): Payer: PPO | Admitting: Certified Registered"

## 2015-12-11 ENCOUNTER — Ambulatory Visit (HOSPITAL_BASED_OUTPATIENT_CLINIC_OR_DEPARTMENT_OTHER)
Admission: RE | Admit: 2015-12-11 | Discharge: 2015-12-11 | Disposition: A | Discharge status: Home / Self Care | Payer: PPO | Source: Ambulatory Visit | Attending: General Surgery | Admitting: General Surgery

## 2015-12-11 DIAGNOSIS — Z803 Family history of malignant neoplasm of breast: Secondary | ICD-10-CM | POA: Insufficient documentation

## 2015-12-11 DIAGNOSIS — Z170 Estrogen receptor positive status [ER+]: Secondary | ICD-10-CM | POA: Insufficient documentation

## 2015-12-11 DIAGNOSIS — C50919 Malignant neoplasm of unspecified site of unspecified female breast: Secondary | ICD-10-CM | POA: Diagnosis not present

## 2015-12-11 DIAGNOSIS — Z6841 Body Mass Index (BMI) 40.0 and over, adult: Secondary | ICD-10-CM | POA: Diagnosis not present

## 2015-12-11 DIAGNOSIS — Z95828 Presence of other vascular implants and grafts: Secondary | ICD-10-CM

## 2015-12-11 DIAGNOSIS — M1990 Unspecified osteoarthritis, unspecified site: Secondary | ICD-10-CM | POA: Insufficient documentation

## 2015-12-11 DIAGNOSIS — C50211 Malignant neoplasm of upper-inner quadrant of right female breast: Secondary | ICD-10-CM

## 2015-12-11 DIAGNOSIS — Z9889 Other specified postprocedural states: Secondary | ICD-10-CM | POA: Diagnosis not present

## 2015-12-11 DIAGNOSIS — C50911 Malignant neoplasm of unspecified site of right female breast: Secondary | ICD-10-CM | POA: Insufficient documentation

## 2015-12-11 DIAGNOSIS — C773 Secondary and unspecified malignant neoplasm of axilla and upper limb lymph nodes: Secondary | ICD-10-CM | POA: Diagnosis not present

## 2015-12-11 HISTORY — PX: PORTACATH PLACEMENT: SHX2246

## 2015-12-11 SURGERY — INSERTION, TUNNELED CENTRAL VENOUS DEVICE, WITH PORT
Anesthesia: General | Site: Chest | Laterality: Right

## 2015-12-11 MED ORDER — BUPIVACAINE HCL (PF) 0.25 % IJ SOLN
INTRAMUSCULAR | Status: AC
  Filled 2015-12-11: qty 30

## 2015-12-11 MED ORDER — FENTANYL CITRATE (PF) 100 MCG/2ML IJ SOLN
INTRAMUSCULAR | Status: AC
  Filled 2015-12-11: qty 2

## 2015-12-11 MED ORDER — HEPARIN SOD (PORK) LOCK FLUSH 100 UNIT/ML IV SOLN
INTRAVENOUS | Status: AC
  Filled 2015-12-11: qty 5

## 2015-12-11 MED ORDER — PROPOFOL 500 MG/50ML IV EMUL
INTRAVENOUS | Status: AC
  Filled 2015-12-11: qty 50

## 2015-12-11 MED ORDER — HYDROMORPHONE HCL 1 MG/ML IJ SOLN: 0.2500 mg | INTRAMUSCULAR | Status: DC | PRN

## 2015-12-11 MED ORDER — PROPOFOL 10 MG/ML IV BOLUS
INTRAVENOUS | Status: DC | PRN
  Administered 2015-12-11: 150 mg via INTRAVENOUS

## 2015-12-11 MED ORDER — HEPARIN (PORCINE) IN NACL 2-0.9 UNIT/ML-% IJ SOLN
INTRAMUSCULAR | Status: DC | PRN
  Administered 2015-12-11: 1 via INTRAVENOUS

## 2015-12-11 MED ORDER — GLYCOPYRROLATE 0.2 MG/ML IJ SOLN: 0.2000 mg | Freq: Once | INTRAMUSCULAR | Status: DC | PRN

## 2015-12-11 MED ORDER — OXYCODONE HCL 5 MG PO TABS: 5.0000 mg | ORAL_TABLET | Freq: Once | ORAL | Status: DC | PRN

## 2015-12-11 MED ORDER — SCOPOLAMINE 1 MG/3DAYS TD PT72: 1.0000 | MEDICATED_PATCH | Freq: Once | TRANSDERMAL | Status: DC

## 2015-12-11 MED ORDER — HEPARIN (PORCINE) IN NACL 2-0.9 UNIT/ML-% IJ SOLN
INTRAMUSCULAR | Status: AC
  Filled 2015-12-11: qty 500

## 2015-12-11 MED ORDER — LACTATED RINGERS IV SOLN
INTRAVENOUS | Status: DC
  Administered 2015-12-11: 10:00:00 via INTRAVENOUS

## 2015-12-11 MED ORDER — LIDOCAINE HCL (CARDIAC) 20 MG/ML IV SOLN
INTRAVENOUS | Status: AC
  Filled 2015-12-11: qty 5

## 2015-12-11 MED ORDER — ONDANSETRON HCL 4 MG/2ML IJ SOLN
INTRAMUSCULAR | Status: DC | PRN
  Administered 2015-12-11: 4 mg via INTRAVENOUS

## 2015-12-11 MED ORDER — PHENYLEPHRINE HCL 10 MG/ML IJ SOLN
INTRAMUSCULAR | Status: DC | PRN
  Administered 2015-12-11 (×2): 40 ug via INTRAVENOUS

## 2015-12-11 MED ORDER — LIDOCAINE HCL (CARDIAC) 20 MG/ML IV SOLN
INTRAVENOUS | Status: DC | PRN
  Administered 2015-12-11: 30 mg via INTRAVENOUS

## 2015-12-11 MED ORDER — HEPARIN SOD (PORK) LOCK FLUSH 100 UNIT/ML IV SOLN
INTRAVENOUS | Status: DC | PRN
  Administered 2015-12-11: 400 [IU] via INTRAVENOUS

## 2015-12-11 MED ORDER — OXYCODONE HCL 5 MG/5ML PO SOLN: 5.0000 mg | Freq: Once | ORAL | Status: DC | PRN

## 2015-12-11 MED ORDER — ONDANSETRON HCL 4 MG/2ML IJ SOLN
INTRAMUSCULAR | Status: AC
  Filled 2015-12-11: qty 2

## 2015-12-11 MED ORDER — MEPERIDINE HCL 25 MG/ML IJ SOLN: 6.2500 mg | INTRAMUSCULAR | Status: DC | PRN

## 2015-12-11 MED ORDER — MIDAZOLAM HCL 2 MG/2ML IJ SOLN: 1.0000 mg | INTRAMUSCULAR | Status: DC | PRN

## 2015-12-11 MED ORDER — CEFAZOLIN SODIUM-DEXTROSE 2-3 GM-% IV SOLR
2.0000 g | INTRAVENOUS | Status: AC
  Administered 2015-12-11: 2 g via INTRAVENOUS

## 2015-12-11 MED ORDER — FENTANYL CITRATE (PF) 100 MCG/2ML IJ SOLN
50.0000 ug | INTRAMUSCULAR | Status: DC | PRN
  Administered 2015-12-11 (×2): 50 ug via INTRAVENOUS

## 2015-12-11 MED ORDER — CEFAZOLIN SODIUM-DEXTROSE 2-3 GM-% IV SOLR
INTRAVENOUS | Status: AC
  Filled 2015-12-11: qty 50

## 2015-12-11 MED ORDER — HYDROCODONE-ACETAMINOPHEN 10-325 MG PO TABS: 1.0000 | ORAL_TABLET | Freq: Four times a day (QID) | ORAL | Status: DC | PRN

## 2015-12-11 MED ORDER — DEXAMETHASONE SODIUM PHOSPHATE 4 MG/ML IJ SOLN
INTRAMUSCULAR | Status: DC | PRN
  Administered 2015-12-11: 10 mg via INTRAVENOUS

## 2015-12-11 MED ORDER — BUPIVACAINE HCL (PF) 0.25 % IJ SOLN
INTRAMUSCULAR | Status: DC | PRN
  Administered 2015-12-11: 10 mL

## 2015-12-11 MED ORDER — DEXAMETHASONE SODIUM PHOSPHATE 10 MG/ML IJ SOLN
INTRAMUSCULAR | Status: AC
  Filled 2015-12-11: qty 1

## 2015-12-11 SURGICAL SUPPLY — 50 items
APL SKNCLS STERI-STRIP NONHPOA (GAUZE/BANDAGES/DRESSINGS) ×1
BAG DECANTER FOR FLEXI CONT (MISCELLANEOUS) ×3 IMPLANT
BENZOIN TINCTURE PRP APPL 2/3 (GAUZE/BANDAGES/DRESSINGS) ×3 IMPLANT
BLADE SURG 11 STRL SS (BLADE) ×3 IMPLANT
BLADE SURG 15 STRL LF DISP TIS (BLADE) ×1 IMPLANT
BLADE SURG 15 STRL SS (BLADE) ×3
CANISTER SUCT 1200ML W/VALVE (MISCELLANEOUS) IMPLANT
CHLORAPREP W/TINT 26ML (MISCELLANEOUS) ×3 IMPLANT
CLOSURE WOUND 1/2 X4 (GAUZE/BANDAGES/DRESSINGS) ×1
COVER BACK TABLE 60X90IN (DRAPES) ×3 IMPLANT
COVER MAYO STAND STRL (DRAPES) ×3 IMPLANT
DECANTER SPIKE VIAL GLASS SM (MISCELLANEOUS) IMPLANT
DRAPE C-ARM 42X72 X-RAY (DRAPES) ×3 IMPLANT
DRAPE LAPAROSCOPIC ABDOMINAL (DRAPES) ×3 IMPLANT
DRSG TEGADERM 4X4.75 (GAUZE/BANDAGES/DRESSINGS) IMPLANT
ELECT COATED BLADE 2.86 ST (ELECTRODE) ×3 IMPLANT
ELECT REM PT RETURN 9FT ADLT (ELECTROSURGICAL) ×3
ELECTRODE REM PT RTRN 9FT ADLT (ELECTROSURGICAL) ×1 IMPLANT
GLOVE BIO SURGEON STRL SZ7 (GLOVE) ×5 IMPLANT
GLOVE BIOGEL PI IND STRL 7.5 (GLOVE) ×1 IMPLANT
GLOVE BIOGEL PI INDICATOR 7.5 (GLOVE) ×6
GOWN STRL REUS W/ TWL LRG LVL3 (GOWN DISPOSABLE) ×4 IMPLANT
GOWN STRL REUS W/TWL LRG LVL3 (GOWN DISPOSABLE) ×12
IV KIT MINILOC 20X1 SAFETY (NEEDLE) IMPLANT
KIT PORT POWER 8FR ISP CVUE (Catheter) ×2 IMPLANT
LIQUID BAND (GAUZE/BANDAGES/DRESSINGS) ×3 IMPLANT
MARKER SKIN DUAL TIP RULER LAB (MISCELLANEOUS) ×3 IMPLANT
NDL HYPO 25X1 1.5 SAFETY (NEEDLE) ×1 IMPLANT
NDL SAFETY ECLIPSE 18X1.5 (NEEDLE) IMPLANT
NEEDLE HYPO 18GX1.5 SHARP (NEEDLE)
NEEDLE HYPO 25X1 1.5 SAFETY (NEEDLE) ×3 IMPLANT
PACK BASIN DAY SURGERY FS (CUSTOM PROCEDURE TRAY) ×3 IMPLANT
PENCIL BUTTON HOLSTER BLD 10FT (ELECTRODE) ×3 IMPLANT
SLEEVE SCD COMPRESS KNEE MED (MISCELLANEOUS) ×3 IMPLANT
SPONGE GAUZE 4X4 12PLY STER LF (GAUZE/BANDAGES/DRESSINGS) ×3 IMPLANT
STAPLER VISISTAT 35W (STAPLE) ×3 IMPLANT
STRIP CLOSURE SKIN 1/2X4 (GAUZE/BANDAGES/DRESSINGS) ×2 IMPLANT
SUT MON AB 4-0 PC3 18 (SUTURE) ×3 IMPLANT
SUT PROLENE 2 0 SH DA (SUTURE) ×3 IMPLANT
SUT SILK 2 0 TIES 17X18 (SUTURE)
SUT SILK 2-0 18XBRD TIE BLK (SUTURE) IMPLANT
SUT VIC AB 3-0 SH 27 (SUTURE) ×3
SUT VIC AB 3-0 SH 27X BRD (SUTURE) ×1 IMPLANT
SYR 5ML LUER SLIP (SYRINGE) ×3 IMPLANT
SYR CONTROL 10ML LL (SYRINGE) ×3 IMPLANT
TOWEL OR 17X24 6PK STRL BLUE (TOWEL DISPOSABLE) ×3 IMPLANT
TOWEL OR NON WOVEN STRL DISP B (DISPOSABLE) ×3 IMPLANT
TUBE CONNECTING 20'X1/4 (TUBING)
TUBE CONNECTING 20X1/4 (TUBING) IMPLANT
YANKAUER SUCT BULB TIP NO VENT (SUCTIONS) IMPLANT

## 2015-12-11 NOTE — Op Note (Signed)
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 We discussed port placement prior to her beginning chemotherapy after meeting with medical oncology.   Procedure: After informed consent was obtained the patient was taken to the operating room. She was given antibiotics. Sequential compression devices were on her legs. She was then placed under general anesthesia with an LMA. Then she was then prepped and draped in the standard sterile surgical fashion. Surgical timeout was then performed.  I used the ultrasound to identify the right internal jugular vein. I then accessed the vein using the ultrasound. This aspirated blood. I then placed the wire.this was confirmed by fluoroscopy to be in the correct position. I then made a pocket below her clavicle on the right side. I tunneled the line between the 2 sites. I then dilated the tract and placed the dilator assembly with the sheath. This was done under fluoroscopy. I then removed the sheath and dilator. The wire was also removed. The line was then pulled back to be in the distal cava. I hooked this up to the port. I sutured this into place with 2-0 Prolene in 2 places. This aspirated blood and flushed easily.This was confirmed with a final fluoroscopy. I then closed this with 2-0 Vicryl and 4-0 Monocryl. Dermabond was placed on both the incisions. I then accessed the port for chemotherapy tomorrow. This aspirated blood and I flushed with heparin. A sterile dressing was placed. She tolerated this well and was transferred to the recovery room in stable condition.

## 2015-12-11 NOTE — Interval H&P Note (Signed)
History and Physical Interval Note:  12/11/2015 10:00 AM  Terri Mayer  has presented today for surgery, with the diagnosis of breast cancer  The various methods of treatment have been discussed with the patient and family. After consideration of risks, benefits and other options for treatment, the patient has consented to  Procedure(s): INSERTION PORT-A-CATH WITH ULTRASOUND (N/A) as a surgical intervention .  The patient's history has been reviewed, patient examined, no change in status, stable for surgery.  I have reviewed the patient's chart and labs.  Questions were answered to the patient's satisfaction.     Terri Mayer

## 2015-12-11 NOTE — Anesthesia Postprocedure Evaluation (Signed)
Anesthesia Post Note  Patient: Terri Mayer  Procedure(s) Performed: Procedure(s) (LRB): INSERTION PORT-A-CATH WITH ULTRASOUND (Right)  Patient location during evaluation: PACU Anesthesia Type: General Level of consciousness: awake and alert Pain management: pain level controlled Vital Signs Assessment: post-procedure vital signs reviewed and stable Respiratory status: spontaneous breathing, nonlabored ventilation and respiratory function stable Cardiovascular status: blood pressure returned to baseline and stable Postop Assessment: no signs of nausea or vomiting Anesthetic complications: no    Last Vitals:  Filed Vitals:   12/11/15 1145 12/11/15 1230  BP: 144/96 155/94  Pulse:  77  Temp:  36.6 C  Resp:  16    Last Pain:  Filed Vitals:   12/11/15 1234  PainSc: 1                  Dao Mearns A

## 2015-12-11 NOTE — Anesthesia Procedure Notes (Signed)
Procedure Name: LMA Insertion Date/Time: 12/11/2015 10:21 AM Performed by: Shylo Zamor D Pre-anesthesia Checklist: Patient identified, Emergency Drugs available, Suction available and Patient being monitored Patient Re-evaluated:Patient Re-evaluated prior to inductionOxygen Delivery Method: Circle System Utilized Preoxygenation: Pre-oxygenation with 100% oxygen Intubation Type: IV induction Ventilation: Mask ventilation without difficulty LMA: LMA inserted LMA Size: 4.0 Number of attempts: 1 Airway Equipment and Method: Bite block Placement Confirmation: positive ETCO2 Tube secured with: Tape Dental Injury: Teeth and Oropharynx as per pre-operative assessment

## 2015-12-11 NOTE — Anesthesia Preprocedure Evaluation (Addendum)
Anesthesia Evaluation  Patient identified by MRN, date of birth, ID band Patient awake    Reviewed: Allergy & Precautions, NPO status , Patient's Chart, lab work & pertinent test results  Airway Mallampati: I  TM Distance: >3 FB Neck ROM: Full    Dental  (+) Teeth Intact, Dental Advisory Given   Pulmonary    breath sounds clear to auscultation       Cardiovascular  Rhythm:Regular Rate:Normal     Neuro/Psych    GI/Hepatic   Endo/Other  Morbid obesity  Renal/GU      Musculoskeletal  (+) Arthritis ,   Abdominal   Peds  Hematology   Anesthesia Other Findings   Reproductive/Obstetrics                            Anesthesia Physical Anesthesia Plan  ASA: II  Anesthesia Plan: General   Post-op Pain Management:    Induction: Intravenous  Airway Management Planned: LMA  Additional Equipment:   Intra-op Plan:   Post-operative Plan: Extubation in OR  Informed Consent: I have reviewed the patients History and Physical, chart, labs and discussed the procedure including the risks, benefits and alternatives for the proposed anesthesia with the patient or authorized representative who has indicated his/her understanding and acceptance.   Dental advisory given  Plan Discussed with: CRNA, Anesthesiologist and Surgeon  Anesthesia Plan Comments:         Anesthesia Quick Evaluation

## 2015-12-11 NOTE — Transfer of Care (Signed)
Immediate Anesthesia Transfer of Care Note  Patient: Terri Mayer  Procedure(s) Performed: Procedure(s): INSERTION PORT-A-CATH WITH ULTRASOUND (Right)  Patient Location: PACU  Anesthesia Type:General  Level of Consciousness: awake and patient cooperative  Airway & Oxygen Therapy: Patient Spontanous Breathing and Patient connected to face mask oxygen  Post-op Assessment: Report given to RN and Post -op Vital signs reviewed and stable  Post vital signs: Reviewed and stable  Last Vitals:  Filed Vitals:   12/11/15 0934  BP: 133/79  Pulse: 96  Temp: 36.9 C  Resp: 20    Complications: No apparent anesthesia complications

## 2015-12-11 NOTE — H&P (Signed)
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 Right breast shows possible masses and low right axillary node. left breast is normal. she has no prior breast history. has two cousins with breast cancer. she underwent US that shows at 3 oclock 6 cm from the nipple that measures 1.4x1.2x1.4 cm. adjacent to this mass is a 5 mm node with 2-3 mm of cortical thickness. has normal morphology and normal hilar fat. in 10 oclock position of right breast there is a hypoechoic mass measuring 7x4x8 mm tht is a mm stable mass most likely FA. in right axilla there is an enlarged noee measuring 1.8x1.5x1.8 cm node. this is single enlarged node. biopsy was performed of the breast lesion and the node. there was a ribbon clip in breast and a spiral clip in the node. MRI shows right breast with loq mass measuring 1.5x1.4x1.5 cm. 5 mm directly anterior and lateral to this is a 10x9x9 mm intramammary node. In uoq there is a 6x3x7 mm mass corresponds to the mm stable nodule and is likely benign node or FA. the left breast is normal and there is single enlarged axillary node. biopsy of the node shows metastatic carcinoma. the breast tumor is an idc, grade III the is 10T er pos, 0% pr, her2 positive, Ki is 30%.   Other Problems Elbert Ewings, CMA; 12/08/2015 10:18 AM) Arthritis Back Pain Breast Cancer Hemorrhoids Migraine Headache  Past Surgical History Elbert Ewings, CMA; 12/08/2015 10:18 AM) Breast Biopsy Right. Oral Surgery  Diagnostic Studies History Elbert Ewings, [place]; 12/08/2015 10:18 AM) Colonoscopy >10 years ago  Allergies Elbert Ewings, CMA; 12/08/2015 10:19 AM) Codeine Sulfate *ANALGESICS - OPIOID* Vomiting, Nausea.  Medication History Elbert Ewings, CMA; 12/08/2015 10:20 AM) Vitamin C (500MG Tablet, Oral) Active. Vitamin D (Cholecalciferol) (1000UNIT Tablet, Oral) Active. Flexeril (5MG Tablet, Oral) Active. Ibuprofen  Active. Multiple Vitamin (Oral) Active. Medications Reconciled  Social History Elbert Ewings, [place]; 12/08/2015 10:18 AM) Alcohol use Occasional alcohol use. Caffeine use Carbonated beverages, Tea. No drug use Tobacco use Never smoker.  Family History Elbert Ewings, [place]; 12/08/2015 10:18 AM) Arthritis Mother. Breast Cancer Family Members In General. Cancer Family Members In General. Diabetes Mellitus Family Members In General. Heart Disease Father. Hypertension Brother. Migraine Headache Father.    Review of Systems Elbert Ewings CMA; 12/08/2015 10:18 AM) General Not Present- Appetite Loss, Chills, Fatigue, Fever, Night Sweats, Weight Gain and Weight Loss. Skin Not Present- Change in Wart/Mole, Dryness, Hives, Jaundice, New Lesions, Non-Healing Wounds, Rash and Ulcer. HEENT Present- Seasonal Allergies. Not Present- Earache, Hearing Loss, Hoarseness, Nose Bleed, Oral Ulcers, Ringing in the Ears, Sinus Pain, Sore Throat, Visual Disturbances, Wears glasses/contact lenses and Yellow Eyes. Respiratory Not Present- Bloody sputum, Chronic Cough, Difficulty Breathing, Snoring and Wheezing. Breast Present- Breast Mass. Not Present- Breast Pain, Nipple Discharge and Skin Changes. Cardiovascular Present- Leg Cramps, Rapid Heart Rate and Swelling of Extremities. Not Present- Chest Pain, Difficulty Breathing Lying Down, Palpitations and Shortness of Breath. Gastrointestinal Present- Rectal Pain. Not Present- Abdominal Pain, Bloating, Bloody Stool, Change in Bowel Habits, Chronic diarrhea, Constipation, Difficulty Swallowing, Excessive gas, Gets full quickly at meals, Hemorrhoids, Indigestion, Nausea and Vomiting. Musculoskeletal Present- Back Pain and Joint Pain. Not Present- Joint Stiffness, Muscle Pain, Muscle Weakness and Swelling of Extremities. Neurological Not Present- Decreased Memory, Fainting, Headaches, Numbness, Seizures, Tingling, Tremor, Trouble walking and Weakness. Psychiatric  Not Present- Anxiety, Bipolar, Change in Sleep Pattern, Depression, Fearful and Frequent crying. Endocrine Not Present- Cold Intolerance,  Excessive Hunger, Hair Changes, Heat Intolerance, Hot flashes and New Diabetes. Hematology Not Present- Easy Bruising, Excessive bleeding, Gland problems, HIV and Persistent Infections.  Vitals Elbert Ewings CMA; 12/08/2015 10:20 AM) 12/08/2015 10:20 AM Weight: 249.8 lb Height: 66in Body Surface Area: 2.2 m Body Mass Index: 40.32 kg/m  Temp.: 98.29F(Temporal)  Pulse: 85 (Regular)  BP: 136/72 (Sitting, Left Arm, Standard)       Physical Exam Rolm Bookbinder MD; 12/08/2015 2:28 PM) General Mental Status-Alert. Orientation-Oriented X3.  Chest and Lung Exam Chest and lung exam reveals -on auscultation, normal breath sounds, no adventitious sounds and normal vocal resonance.  Breast Nipples-No Discharge. Breast Lump-No Palpable Breast Mass.  Cardiovascular Cardiovascular examination reveals -normal heart sounds, regular rate and rhythm with no murmurs.  Lymphatic Head & Neck  General Head & Neck Lymphatics: Bilateral - Description - Normal. Axillary  General Axillary Region: Bilateral - Description - Normal. Note: no [place] adenopathy     Assessment & Plan Rolm Bookbinder MD; 12/08/2015 2:32 PM) STAGE II BREAST CANCER, RIGHT (C50.911) Story: Port placement for pimary chemotherapy we discussed staging and pathophysiology of breast cancer as well as all available treatments. We discussed her2 positive disease. she has stage II node positive disease and I think that primary chemotherapy with antiher2 therapy is best option. breast surgery now could be lumpectomy (I think this would encompass both the tumor and the im node that is adjacent and dont need to biopsy that). we discussed lumpectomy with negative margins and radiotherapy as being equivalent to mastectomy. advantage of primary chemo is potentially doing less  axilllary surgery down the road. I will plan on surgery after chemo. I discussed port placement and risks/benefits. will plan on doing this next week so she can start chemo soon.

## 2015-12-11 NOTE — Discharge Instructions (Signed)

## 2015-12-12 ENCOUNTER — Encounter (HOSPITAL_BASED_OUTPATIENT_CLINIC_OR_DEPARTMENT_OTHER): Payer: Self-pay | Admitting: General Surgery

## 2015-12-12 ENCOUNTER — Ambulatory Visit (HOSPITAL_COMMUNITY)
Admission: RE | Admit: 2015-12-12 | Discharge: 2015-12-12 | Disposition: A | Discharge status: Home / Self Care | Payer: PPO | Source: Ambulatory Visit | Attending: Internal Medicine | Admitting: Internal Medicine

## 2015-12-12 ENCOUNTER — Other Ambulatory Visit: Payer: Self-pay | Admitting: Oncology

## 2015-12-12 ENCOUNTER — Ambulatory Visit (HOSPITAL_BASED_OUTPATIENT_CLINIC_OR_DEPARTMENT_OTHER)
Admission: RE | Admit: 2015-12-12 | Discharge: 2015-12-12 | Disposition: A | Discharge status: Home / Self Care | Payer: PPO | Source: Ambulatory Visit | Attending: Internal Medicine | Admitting: Internal Medicine

## 2015-12-12 VITALS — BP 122/70 | HR 65 | Wt 252.0 lb

## 2015-12-12 DIAGNOSIS — R002 Palpitations: Secondary | ICD-10-CM | POA: Diagnosis not present

## 2015-12-12 DIAGNOSIS — I429 Cardiomyopathy, unspecified: Secondary | ICD-10-CM | POA: Diagnosis not present

## 2015-12-12 DIAGNOSIS — C50211 Malignant neoplasm of upper-inner quadrant of right female breast: Secondary | ICD-10-CM

## 2015-12-12 DIAGNOSIS — I071 Rheumatic tricuspid insufficiency: Secondary | ICD-10-CM | POA: Diagnosis not present

## 2015-12-12 DIAGNOSIS — I517 Cardiomegaly: Secondary | ICD-10-CM | POA: Diagnosis not present

## 2015-12-12 DIAGNOSIS — R600 Localized edema: Secondary | ICD-10-CM

## 2015-12-12 NOTE — Progress Notes (Signed)
*  PRELIMINARY RESULTS* Echocardiogram 2D Echocardiogram has been performed.  Terri Mayer 10/21/2016, 1:58 PM 

## 2015-12-12 NOTE — Progress Notes (Unsigned)
Note from Dr Donne Hazel re port tip  location  She is home from day surgery now. Her line on portable xray report reads tip is in upper right atrium. dont think she is taking a deep breath at all. On fluoro looked right at junction with a deep breath. She had ectopy when line was in to far and stopped where I had it there. I think line is in good position and doesn't need to be moved. Just wanted to tell you.  Matt

## 2015-12-12 NOTE — Addendum Note (Signed)
Encounter addended by: Harvie Junior, CMA on: 12/12/2015  1:13 PM<BR>     Documentation filed: Dx Association, Patient Instructions Section, Orders

## 2015-12-12 NOTE — Progress Notes (Signed)
Patient ID: Terri Mayer, female   DOB: 06-28-1951, 65 y.o.   MRN: [id number]  Referring Physician: Dr Jana Hakim Primary Care: Dr. Bernerd Limbo Primary Cardiologist: New (Dr. 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 ER/PR- and HER-2-neu positive. Referred by Dr. Jana Hakim for enrollment into cardio-oncology clinic.   Breast CA profile: Biopsy of the right breast 8:30 o'clock lesion and the right axillary lymph node on 11/16/2015 showed ([id number]) both to be positive for invasive ductal carcinoma, grade 3, the breast mass being estrogen receptor 10% positive with moderate staining intensity, but progesterone receptor negative, but the lymph node being estrogen and progesterone both negative. Both the breast and lymph nodes had MIB-1 of 30%. Both were positive for HER-2 amplification, the signals ratio is being 6.06 and 7.61, and the number per cell 10.00 and 11.80.   Had prot placed yesterday. Will start chemo 12/18/15. Will also see Dr. Donne Hazel for surgery and Dr. Pablo Ledger in XRT.    Echo 12/12/15 EF 65%, LS' 10.1, GLS -21.9%, Grade 2 DD  Review of Systems: [y] = yes, '[ ]'  = no   General: Weight gain '[ ]' ; Weight loss '[ ]' ; Anorexia '[ ]' ; Fatigue '[ ]' ; Fever '[ ]' ; Chills '[ ]' ; Weakness '[ ]'   Cardiac: Chest pain/pressure '[ ]' ; Resting SOB '[ ]' ; Exertional SOB '[ ]' ; Orthopnea '[ ]' ; Pedal Edema [name] ]; Palpitations '[ ]' ; Syncope '[ ]' ; Presyncope '[ ]' ; Paroxysmal nocturnal dyspnea'[ ]'   Pulmonary: Cough '[ ]' ; Wheezing'[ ]' ; Hemoptysis'[ ]' ; Sputum '[ ]' ; Snoring '[ ]'   GI: Vomiting'[ ]' ; Dysphagia'[ ]' ; Melena'[ ]' ; Hematochezia '[ ]' ; Heartburn'[ ]' ; Abdominal pain '[ ]' ; Constipation '[ ]' ; Diarrhea '[ ]' ; BRBPR '[ ]'   GU: Hematuria'[ ]' ; Dysuria '[ ]' ; Nocturia'[ ]'   Vascular: Pain in legs with walking '[ ]' ; Pain in feet with lying flat '[ ]' ; Non-healing sores '[ ]' ; Stroke '[ ]' ; TIA '[ ]' ; Slurred speech [  ];  Neuro: Headaches'[ ]' ; Vertigo'[ ]' ; Seizures'[ ]' ; Paresthesias'[ ]' ;Blurred vision '[ ]' ; Diplopia '[ ]' ; Vision changes '[ ]'   Ortho/Skin: Arthritis [name] ]; Joint pain '[ ]' ; Muscle pain '[ ]' ; Joint swelling '[ ]' ; Back Pain '[ ]' ; Rash '[ ]'   Psych: Depression'[ ]' ; Anxiety'[ ]'   Heme: Bleeding problems '[ ]' ; Clotting disorders '[ ]' ; Anemia '[ ]'   Endocrine: Diabetes '[ ]' ; Thyroid dysfunction'[ ]'    Past Medical History  Diagnosis Date  . History of chicken pox   . Allergy   . Breast cancer ([place])   . Osteoarthritis     Neck,Back,Knees,Hips,Ankles    Current Outpatient Prescriptions  Medication Sig Dispense Refill  . IBUPROFEN PO Take by mouth daily.       No current facility-administered medications for this encounter.    Allergies  Allergen Reactions  . Codeine Nausea And Vomiting      Social History   Social History  . Marital Status: Single    Spouse Name: N/A  . Number of Children: 1  . Years of Education: N/A   Occupational History  . dentist    Social History Main Topics  . Smoking status: Never Smoker   . Smokeless tobacco: Never Used  . Alcohol Use: Yes     Comment: rarely   . Drug Use: Not on file  . Sexual Activity: Not on file   Other Topics Concern  . Not on file  Social History Narrative   Single, adopted 1 chil      Family History  Problem Relation Age of Onset  . Heart disease Father   . Diabetes      GM  . Colon cancer Neg Hx   . Breast cancer Neg Hx   . Dementia      M  . Transient ischemic attack      F    Filed Vitals:   12/12/15 1208  BP: 122/70  Pulse: 65  Weight: 252 lb (114.306 kg)  SpO2: 99%    PHYSICAL EXAM: General:  Well appearing. No respiratory difficulty HEENT: normal Neck: supple. no JVD. Carotids 2+ bilat; no bruits. No thyromegaly or nodule noted. Cor: PMI nondisplaced. RRR, No M/G/R. Port R chest. Healing well. Lungs: CTAB, normal effort Abdomen: Obese, soft, NT, ND, no HSM. No bruits or masses. +BS  Extremities: no cyanosis,  clubbing, rash. Trace - 1+ ankle edema.  Neuro: alert & oriented x 3, cranial nerves grossly intact. moves all 4 extremities w/o difficulty. Affect pleasant.   ASSESSMENT & PLAN:   1. Female breast cancer of the right upper-inner quadrant - To get Carboplatin + Docetaxel x 3 weeks for 6 cycles + trastuzumab and pertuzamab over same time period, and then finish year course of Herceptin. - Breast conserving surgery to follow. (Consideration for [facility] trial per Dr Jana Hakim note) - Echo today with EF 65%, LS' 10.1, GLS -21.9%, Grade 2 DD - Follow up 3 months with Echo.  2. Peripheral edema - Encouraged to limit sodium and fluid intake - If worsens, will consider using 20 mg lasix as needed for worsening edema.   Terri Friar, PA-C 12/12/2015   Patient seen and examined with Terri Kilts, PA-C. We discussed all aspects of the encounter. I agree with the assessment and plan as stated above.   Explained incidence of Herceptin cardiotoxicity and role of Cardio-oncology clinic at length. Echo images reviewed personally. All parameters stable. Reviewed signs and symptoms of HF to look for. Continue Herceptin. Follow-up with echo in 3 months. Can use prn lasix.  Terri Laubscher,MD 12:54 PM

## 2015-12-12 NOTE — Patient Instructions (Signed)
Follow up and Echocardiogram in 3 months with Dr.Bensimhon

## 2015-12-13 ENCOUNTER — Other Ambulatory Visit: Payer: PPO

## 2015-12-14 ENCOUNTER — Other Ambulatory Visit: Payer: PPO

## 2015-12-14 ENCOUNTER — Encounter: Payer: Self-pay | Admitting: *Deleted

## 2015-12-14 NOTE — Progress Notes (Signed)
Spent time with patient and her girlfriends in chemo class today reviewing side effects of Taxotere, Carboplatin, Perjeta, Herceptin.  See education flowsheet

## 2015-12-15 ENCOUNTER — Ambulatory Visit (HOSPITAL_BASED_OUTPATIENT_CLINIC_OR_DEPARTMENT_OTHER): Payer: PPO | Admitting: Nurse Practitioner

## 2015-12-15 ENCOUNTER — Encounter: Payer: Self-pay | Admitting: Oncology

## 2015-12-15 ENCOUNTER — Telehealth: Payer: Self-pay | Admitting: *Deleted

## 2015-12-15 ENCOUNTER — Encounter: Payer: Self-pay | Admitting: Nurse Practitioner

## 2015-12-15 VITALS — BP 134/57 | HR 77 | Temp 98.1°F | Resp 17 | Ht 66.0 in | Wt 247.5 lb

## 2015-12-15 DIAGNOSIS — Z23 Encounter for immunization: Secondary | ICD-10-CM | POA: Diagnosis not present

## 2015-12-15 DIAGNOSIS — C773 Secondary and unspecified malignant neoplasm of axilla and upper limb lymph nodes: Secondary | ICD-10-CM

## 2015-12-15 DIAGNOSIS — C50211 Malignant neoplasm of upper-inner quadrant of right female breast: Secondary | ICD-10-CM

## 2015-12-15 DIAGNOSIS — G4700 Insomnia, unspecified: Secondary | ICD-10-CM

## 2015-12-15 DIAGNOSIS — C50511 Malignant neoplasm of lower-outer quadrant of right female breast: Secondary | ICD-10-CM

## 2015-12-15 MED ORDER — INFLUENZA VAC SPLIT QUAD 0.5 ML IM SUSY
0.5000 mL | PREFILLED_SYRINGE | Freq: Once | INTRAMUSCULAR | Status: AC
  Administered 2015-12-15: 0.5 mL via INTRAMUSCULAR
  Filled 2015-12-15: qty 0.5

## 2015-12-15 MED ORDER — LIDOCAINE-PRILOCAINE 2.5-2.5 % EX CREA: TOPICAL_CREAM | CUTANEOUS | Status: DC

## 2015-12-15 MED ORDER — DEXAMETHASONE 4 MG PO TABS: 8.0000 mg | ORAL_TABLET | Freq: Two times a day (BID) | ORAL | Status: DC

## 2015-12-15 MED ORDER — LORAZEPAM 0.5 MG PO TABS: 0.5000 mg | ORAL_TABLET | Freq: Every evening | ORAL | Status: DC | PRN

## 2015-12-15 MED ORDER — ONDANSETRON HCL 8 MG PO TABS: 8.0000 mg | ORAL_TABLET | Freq: Two times a day (BID) | ORAL | Status: DC

## 2015-12-15 MED ORDER — PROCHLORPERAZINE MALEATE 10 MG PO TABS: 10.0000 mg | ORAL_TABLET | Freq: Four times a day (QID) | ORAL | Status: DC | PRN

## 2015-12-15 MED FILL — LORazepam 0.5 MG TABS: 0.5 | 30 days supply | Qty: 30 | Fill #0

## 2015-12-15 MED FILL — DEXAMETHASONE 4 MG TABLET: 4 | 7 days supply | Qty: 30 | Fill #0

## 2015-12-15 MED FILL — PROCHLORPERAZINE 10 MG TAB: 10 | 7 days supply | Qty: 30 | Fill #0

## 2015-12-15 MED FILL — LIDOCAINE-PRILOCAINE CREAM: 2.5-2.5 | 10 days supply | Qty: 30 | Fill #0

## 2015-12-15 MED FILL — ONDANSETRON HCL 8 MG TABLET: 8 | 15 days supply | Qty: 30 | Fill #0

## 2015-12-15 NOTE — Progress Notes (Signed)
[place]  [phone] [phone]) [phone]   ID: YAIRA BERNARDI DOB: [birth year]  MR#: [id number]  JSE#:[id number]  Patient Care Team: No Pcp Per Patient as PCP - General (General Practice) Chauncey Cruel, MD as Consulting Physician (Oncology) Rolm Bookbinder, MD as Consulting Physician (General Surgery) Thea Silversmith, MD as Consulting Physician (Radiation Oncology) Princess Bruins, MD as Consulting Physician (Obstetrics and Gynecology) Jolaine Artist, MD as Consulting Physician (Cardiology) PCP: No PCP Per Patient GYN: SU:  OTHER MD:  CHIEF COMPLAINT: right breast cancer  CURRENT TREATMENT: neoadjuvant chemotherapy  BREAST CANCER HISTORY: "Terri Mayer" underwent bilateral screening mammography with tomosynthesis at the breast center 11/02/2015 showing a possible mass in the right breast and low right axillary area. She was called back for right diagnostic mammography with tomosynthesis and right ultrasonography 11/14/2015. The breast density was category B. There was a lobulated mass in the lateral right breast, with a smaller oval mass immediately adjacent to it. Also in the upper outer quadrant there was a third mass. This has been stable back to 2014 and is felt to be a fibroadenoma.  Ultrasound of the right breast confirmed a hypoechoic mass at the 3:00 position 6 cm from the nipple, measuring 1.4 cm. Adjacent to this there was a 0.5 cm internal mammary lymph node, with normal morphology and normal hilar fat. The mass at the 10:00 position measured 0.8 cm and again was consistent with a fibroadenoma. In the right axilla there was an abnormal lymph node with no internal fat measuring 1.8 cm. The rest of the exam was unremarkable.  Biopsy of the right breast 8:30 o'clock lesion and the right axillary lymph node on 11/16/2015 showed ([id number]) both to be positive for invasive ductal carcinoma, grade 3, the breast mass being estrogen receptor 10%  positive with moderate staining intensity, but progesterone receptor negative, but the lymph node being estrogen and progesterone both negative. Both the breast and lymph nodes had MIB-1 of 30%. Both were positive for HER-2 amplification, the signals ratio is being 6.06 and 7.61, and the number per cell 10.00 and 11.80.  The patient's subsequent history is as detailed below   INTERVAL HISTORY: Terri Mayer returns for follow up of her breast cancer. She went to chemo school yesterday and had a port placed last week. This healing well. She never had to use narcotics for pain. She is ready to discuss her antiemetic schedule for her neoadjuvant treatment. Cycle 1 of carboplatin, docetaxel, trastuzumab, and pertuzumab is set to begin on Monday.   REVIEW OF SYSTEMS: Terri Mayer denies unusual headaches, visual changes, nausea, vomiting, stiff neck, dizziness, or gait imbalance. There has been no cough, phlegm production, or pleurisy, no chest pain or pressure, and no change in bowel or bladder habits. The patient denies fever, rash, bleeding, unexplained fatigue or unexplained weight loss. she admits to rare ankle swelling. She has a large toenail that will need repair at some point and she complains of problems with insomnia. Occasionally she will feel her heart races a little, but she admits to being a "coca-holic". A detailed review of systems was otherwise entirely negative.   PAST MEDICAL HISTORY: Past Medical History  Diagnosis Date  . History of chicken pox   . Allergy   . Breast cancer ([facility])   . Osteoarthritis     Neck,Back,Knees,Hips,Ankles    PAST SURGICAL HISTORY: Past Surgical History  Procedure Laterality Date  . Eye surgery  at age 65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  .  Wisdom tooth extraction    . Dilation and curettage of uterus    . Portacath placement Right 12/11/2015    Procedure: INSERTION PORT-A-CATH WITH ULTRASOUND;  Surgeon: Rolm Bookbinder, MD;  Location: [facility];  Service:  General;  Laterality: Right;    FAMILY HISTORY Family History  Problem Relation Age of Onset  . Heart disease Father   . Diabetes      GM  . Colon cancer Neg Hx   . Breast cancer Neg Hx   . Dementia      M  . Transient ischemic attack      F   the patient's father died after multiple strokes at the age of 84. The patient's mother died at the age of 41 with Alzheimer's disease. The patient had one brother, one sister. The patient's paternal grandfather died from lung cancer in his 38s. On the mother's side there are 2 (second) cousins with breast cancer diagnosed in their early 87s, and a more remote cousin diagnosed with breast cancer at age 19. There is no history of ovarian cancer.   GYNECOLOGIC HISTORY:  No LMP recorded. Patient is postmenopausal.  menarche age 19, the patient is GX P0 and she understands that not carrying a child to term before the age of 41 essentially doubled the risk of breast cancer. She stopped having periods between age 84 and 28 and did not take hormone replacement. 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 She lives by herself, with her pet dog. Her son, adopted at birth, Joselyn Edling, currently 20, attends [employer] in the psychology Department.     ADVANCED DIRECTIVES: Not in place. At the 12/15/2015 clinic visit the patient was given the appropriate forms to complete and notarize at her discretion.    HEALTH MAINTENANCE: Social History  Substance Use Topics  . Smoking status: Never Smoker   . Smokeless tobacco: Never Used  . Alcohol Use: Yes     Comment: rarely      Colonoscopy: 12/28/2004  PAP:  Bone density:  Lipid panel:  Allergies  Allergen Reactions  . Codeine Nausea And Vomiting    Current Outpatient Prescriptions  Medication Sig Dispense Refill  . IBUPROFEN PO Take by mouth daily.      Marland Kitchen LORazepam  (ATIVAN) 0.5 MG tablet Take 1 tablet (0.5 mg total) by mouth at bedtime as needed (Nausea or vomiting). 30 tablet 0   No current facility-administered medications for this visit.    OBJECTIVE: Middle-aged white woman in no acute distress  Filed Vitals:   12/15/15 1104  BP: 134/57  Pulse: 77  Temp: 98.1 F (36.7 C)  Resp: 17     Body mass index is 39.97 kg/(m^2).    ECOG FS:0 - Asymptomatic  Skin: warm, dry  HEENT: sclerae anicteric, conjunctivae pink, oropharynx clear. No thrush or mucositis.  Lymph Nodes: No cervical or supraclavicular lymphadenopathy  Lungs: clear to auscultation bilaterally, no rales, wheezes, or rhonci  Heart: regular rate and rhythm  Abdomen: round, soft, non tender, positive bowel sounds  Musculoskeletal: No focal spinal tenderness, no peripheral edema  Neuro: non focal, well oriented, positive affect  Breasts: deferred. Newly placed right chest portacath healing well with no signs of infection  LAB RESULTS:  CMP     Component Value Date/Time   NA 142 11/30/2015 1557  K 4.7 11/30/2015 1557   CO2 29 11/30/2015 1557   GLUCOSE 84 11/30/2015 1557   BUN 17.7 11/30/2015 1557   CREATININE 0.8 11/30/2015 1557   CALCIUM 9.5 11/30/2015 1557   PROT 6.9 11/30/2015 1557   ALBUMIN 3.8 11/30/2015 1557   AST 19 11/30/2015 1557   ALT 17 11/30/2015 1557   ALKPHOS 63 11/30/2015 1557   BILITOT 0.96 11/30/2015 1557    INo results found for: SPEP, UPEP  Lab Results  Component Value Date   WBC 10.2 11/30/2015   NEUTROABS 6.6* 11/30/2015   HGB 13.9 11/30/2015   HCT 42.7 11/30/2015   MCV 80.3 11/30/2015   PLT 338 11/30/2015      Chemistry      Component Value Date/Time   NA 142 11/30/2015 1557   K 4.7 11/30/2015 1557   CO2 29 11/30/2015 1557   BUN 17.7 11/30/2015 1557   CREATININE 0.8 11/30/2015 1557      Component Value Date/Time   CALCIUM 9.5 11/30/2015 1557   ALKPHOS 63 11/30/2015 1557   AST 19 11/30/2015 1557   ALT 17 11/30/2015 1557   BILITOT  0.96 11/30/2015 1557       No results found for: LABCA2  No components found for: LABCA125  No results for input(s): INR in the last 168 hours.  Urinalysis No results found for: COLORURINE, APPEARANCEUR, LABSPEC, PHURINE, GLUCOSEU, HGBUR, BILIRUBINUR, KETONESUR, PROTEINUR, UROBILINOGEN, NITRITE, LEUKOCYTESUR  STUDIES: Dg Eye Foreign Body  12/04/2015  CLINICAL DATA:  Metal working/exposure; clearance prior to MRI EXAM: ORBITS FOR FOREIGN BODY - 2 VIEW COMPARISON:  None. FINDINGS: There is no evidence of metallic foreign body within the orbits. No significant bone abnormality identified. IMPRESSION: No evidence of metallic foreign body within the orbits. Electronically Signed   By: Lorriane Shire M.D.   On: 12/04/2015 07:59   Mr Breast Bilateral W Wo Contrast  12/04/2015  CLINICAL DATA:  65 year old patient with recent diagnosis of right breast cancer at 8:30 position (1.4 cm) and metastasis to a right axillary lymph node following ultrasound-guided biopsies. Additionally, an intramammary lymph node measuring 5 mm was noted immediately adjacent to the biopsy-proven carcinoma. A mammographically stable circumscribed mass in the 10 o'clock position of the right breast measures 8 mm greatest diameter and was felt to be a fibroadenoma given its appearances and stability. The patient presents for breast MRI prior to initiating neoadjuvant chemotherapy. LABS:  None obtained EXAM: BILATERAL BREAST MRI WITH AND WITHOUT CONTRAST TECHNIQUE: Multiplanar, multisequence MR images of both breasts were obtained prior to and following the intravenous administration of 22 ml of MultiHance. THREE-DIMENSIONAL MR IMAGE RENDERING ON INDEPENDENT WORKSTATION: Three-dimensional MR images were rendered by post-processing of the original MR data on an independent workstation. The three-dimensional MR images were interpreted, and findings are reported in the following complete MRI report for this study. Three dimensional  images were evaluated at the independent DynaCad workstation COMPARISON:  Previous exam(s). FINDINGS: Breast composition: b. Scattered fibroglandular tissue. Background parenchymal enhancement: Mild Right breast: In the lower outer quadrant of the right breast, middle third, is an irregular heterogeneously enhancing mass with central biopsy clip artifact. The mass measures 1.5 cm AP x 1.4 cm transverse x 1.5 cm craniocaudal and corresponds to the biopsy-proven malignancy. This mass demonstrates washout kinetics. 5 mm directly anterior and lateral to the biopsy-proven carcinoma is a 10 x 9 x 9 mm intramammary lymph node. The fatty hilum appears relatively small compared to the cortical thickness. A metastasis to the intramammary  lymph node cannot be excluded. In the upper-outer quadrant of the right breast, anterior third, is a circumscribed 6 x 3 x 7 mm enhancing oval mass. This corresponds to mammographically stable circumscribed nodule and may be a benign intramammary lymph node or fibroadenoma. Left breast: No mass or abnormal enhancement. Lymph nodes: In the inferior right axilla is an enlarged level 1 axillary lymph node with marked abnormal cortical thickening and central biopsy clip artifact. This lymph node measures 2.5 x 2.0 x 2.1 cm. No additional enlarged axillary lymph nodes are seen on the right. Left axillary lymph nodes are within normal limits. No internal mammary chain lymphadenopathy. Ancillary findings:  None. IMPRESSION: 1. Biopsy proven invasive mammary carcinoma in the lower outer quadrant of the right breast measures 1.5 cm greatest diameter. Contains central biopsy clip artifact. 2. Biopsy proven metastatic level 1 right axillary lymph node with central biopsy clip artifact measures 2.5 cm greatest diameter. 3. 10 x 9 x 9 mm intramammary lymph node in the right breast 5 mm directly anterior and lateral to the biopsy carcinoma. Involvement of this lymph node with carcinoma cannot be excluded.  4. No MRI evidence of malignancy in the contralateral left breast. RECOMMENDATION: Continue treatment planning. BI-RADS CATEGORY  6: Known biopsy-proven malignancy. Electronically Signed   By: Curlene Dolphin M.D.   On: 12/04/2015 10:34   Dg Chest Port 1 View  12/11/2015  CLINICAL DATA:  Port-A-Cath placement, postop.  Breast cancer. EXAM: PORTABLE CHEST 1 VIEW COMPARISON:  None. FINDINGS: Right internal jugular Port-A-Cath has been placed with the tip in the upper right atrium. No pneumothorax. There is cardiomegaly. Patchy airspace disease bilaterally, most pronounced in the left upper lobe/ lingula and in the right perihilar/ infrahilar region. Suspect small left pleural effusion. No right effusion. No acute bony abnormality. IMPRESSION: Right Port-A-Cath tip in the upper right atrium.  No pneumothorax. Cardiomegaly. Patchy bilateral airspace opacities, left greater than right. This could represent asymmetric edema or infection. Suspect small left effusion. Electronically Signed   By: Rolm Baptise M.D.   On: 12/11/2015 11:23   Mm Digital Diagnostic Unilat R  11/16/2015  CLINICAL DATA:  Post right breast and right axillary lymph node ultrasound-guided core biopsies. EXAM: DIAGNOSTIC RIGHT MAMMOGRAM POST ULTRASOUND BIOPSY COMPARISON:  Previous exam(s). FINDINGS: Mammographic images were obtained following ultrasound guided biopsy of the mass located within the right breast at the 8:30 o'clock position as well as the enlarged right axillary lymph node. The ribbon shaped clip is in appropriate position associated with the mass located within the right breast at the 8:30 o'clock position. The spiral shaped clip is in appropriate position associated with the right axillary lymph node as visualized in the MLO projection. IMPRESSION: Appropriate positioning of clips following right breast and right axillary lymph node ultrasound guided core biopsies. Final Assessment: Post Procedure Mammograms for Marker Placement  Electronically Signed   By: Altamese Cabal M.D.   On: 11/16/2015 10:21   Dg Fluoro Guide Cv Line-no Report  12/11/2015  CLINICAL DATA:  FLOURO GUIDE CV LINE Fluoroscopy was utilized by the requesting physician.  No radiographic interpretation.   [place] Rt Breast Bx W Loc Dev 1st Lesion Img Bx Spec US Guide  11/17/2015  ADDENDUM REPORT: 11/17/2015 12:10 ADDENDUM: Pathology reveals Grade III INVASIVE DUCTAL CARCINOMA of the Right breast at the 8:00 o'clock location. METASTATIC DUCTAL CARCINOMA of the Right Axillary Lymph node. This was found to be concordant by Dr. Luberta Robertson. Pathology results were discussed with the patient  via telephone. She reported no problems with the biopsy site and is doing well. Post biopsy care and instructions were reviewed and her questions were answered. She was encouraged to contact [facility] with any additional questions and or concerns. A surgical referral was arranged with Dr. Rolm Bookbinder, (per patient request), of [facility] on December 08, 2015. A medical oncology referral is being arranged by Bary Castilla RN, Nurse [place], with Dr. Lurline Del of [facility] Pathology results reported by Terie Purser RN on November 17, 2015. Electronically Signed   By: Altamese Cabal M.D.   On: 11/17/2015 12:10  11/17/2015  CLINICAL DATA:  Right breast mass. EXAM: ULTRASOUND GUIDED RIGHT BREAST CORE NEEDLE BIOPSY COMPARISON:  Previous exam(s). FINDINGS: I met with the patient and we discussed the procedure of ultrasound-guided biopsy, including benefits and alternatives. We discussed the high likelihood of a successful procedure. We discussed the risks of the procedure, including infection, bleeding, tissue injury, clip migration, and inadequate sampling. Informed written consent was given. The usual time-out protocol was performed immediately prior to the procedure. Using sterile  technique and 2% Lidocaine as local anesthetic, under direct ultrasound visualization, a 12 gauge spring-loaded device was used to perform biopsy of the mass located within the right breast at the 8:30 o'clock position using a lateral/inferior approach. At the conclusion of the procedure a ribbon shaped tissue marker clip was deployed into the biopsy cavity. Follow up 2 view mammogram was performed and dictated separately. IMPRESSION: Ultrasound guided biopsy of the right breast mass located at the 8:30 o'clock position as discussed above. No apparent complications. Electronically Signed: By: Altamese Cabal M.D. On: 11/16/2015 10:16   [place] Rt Breast Bx W Loc Dev Ea Add Lesion Img Bx Spec US Guide  11/17/2015  ADDENDUM REPORT: 11/17/2015 12:11 ADDENDUM: Pathology reveals Grade III INVASIVE DUCTAL CARCINOMA of the Right breast at the 8:00 o'clock location. METASTATIC DUCTAL CARCINOMA of the Right Axillary Lymph node. This was found to be concordant by Dr. Luberta Robertson. Pathology results were discussed with the patient via telephone. She reported no problems with the biopsy site and is doing well. Post biopsy care and instructions were reviewed and her questions were answered. She was encouraged to contact [facility] with any additional questions and or concerns. A surgical referral was arranged with Dr. Rolm Bookbinder, (per patient request), of [facility] on December 08, 2015. A medical oncology referral is being arranged by Bary Castilla RN, Nurse [place], with Dr. Lurline Del of [facility] Pathology results reported by Terie Purser RN on November 17, 2015. Electronically Signed   By: Altamese Cabal M.D.   On: 11/17/2015 12:11  11/17/2015  CLINICAL DATA:  Right breast mass with enlarged right axillary lymph node. EXAM: ULTRASOUND GUIDED RIGHT AXILLARY LYMPH NODE CORE NEEDLE BIOPSY COMPARISON:  Previous  exam(s). FINDINGS: I met with the patient and we discussed the procedure of ultrasound-guided biopsy, including benefits and alternatives. We discussed the high likelihood of a successful procedure. We discussed the risks of the procedure, including infection, bleeding, tissue injury, clip migration, and inadequate sampling. Informed written consent was given. The usual time-out protocol was performed immediately prior to the procedure. Using sterile technique and 2% Lidocaine as local anesthetic, under direct ultrasound visualization, a 14 gauge spring-loaded device was used to perform biopsy of the enlarged right axillary lymph node  using a lateral approach. At the conclusion of the procedure a spiral shaped HydroMARK tissue marker clip was deployed into the biopsy cavity. Follow up 2 view mammogram was performed and dictated separately. IMPRESSION: Ultrasound guided biopsy of the enlarged right axillary lymph node as discussed above. No apparent complications. Electronically Signed: By: Altamese Cabal M.D. On: 11/16/2015 10:19    ASSESSMENT: 65 y.o. [place] woman status post right breast lower outer quadrant and right axillary lymph node biopsy 11/16/2015, also positive for a clinical  T1c pN1, stage IIA invasive ductal carcinoma grade 3, essentially estrogen and progesterone receptor negative but HER-2 amplified, with an MIB-1 of 30%  (a) the breast lesion had moderate positivity for the estrogen receptor at 10%; the axillary lymph node lesion was estrogen receptor negative. Both were progesterone receptor negative  (1) neoadjuvant chemotherapy with carboplatin, docetaxel, trastuzumab and pertuzumab for 6 cycles, target start date 12/18/2015  (2) trastuzumab to be continued to total one year  (a) baseline echocardiogram on 12/12/15 showed an EF of 60-65%  (3) breast conserving surgery with consideration of the Alliance trial to follow  (4) adjuvant radiation as appropriate   PLAN: Terri Mayer and I  spent about 40 minutes discussing her upcoming chemotherapy plans. She was made aware of potential side effects and toxicities of carboplatin, docetaxel, trastuzumab and pertuzumab. She attended chemotherapy school yesterk so a lot of this information was a review for her. She was made aware of her neulasta injections, including the fact that these will be administered with an onbody injector that she will need to wear until the device is no longer active.   Her baseline echocardiogram was normal. We will repeat these every 3 months.  Finally, we reviewed her antiemetic schedule, and she feels comfortable with every medication listed as well as the indication and potential side effects. These prescriptions were sent to her pharmacy today.   Terri Mayer will return on Monday for her first cycle of treatment. She understands and agrees with this plan. She knows the goal of treatment in her case is cure. She has been encouraged to call with any issues that might arise before her next visit here.  Laurie Panda, NP   12/15/2015 11:09 AM

## 2015-12-15 NOTE — Progress Notes (Signed)
Pt is in need of new PCP. I have arranged for her to be seen in the upcoming months by [facility] with Dr. Pricilla Holm. Pt will p/u up new patient packet at their office today.

## 2015-12-15 NOTE — Telephone Encounter (Signed)
Per staff message and POF I have scheduled appts. Advised scheduler of appts. JMW  

## 2015-12-15 NOTE — Progress Notes (Signed)
Introduced myself as her FA.  Enrolled pt in the [employer] [place].  Pt is approved for drugs associated w/ her Dx for 12 months from 12/14/15 for $5,000.  $1,650 is her guaranteed award, the $3,350 is accessible on a [employer] basis as long as funding remains available.  I will send copy of approval letter and POE to [facility] in billing.  Pt is also approved for the $1000 [employer].  I went over what the grant will and will not cover and gave her the list.  She has my card for any questions or concerns she may have in the future.

## 2015-12-18 ENCOUNTER — Other Ambulatory Visit: Payer: Self-pay | Admitting: *Deleted

## 2015-12-18 ENCOUNTER — Other Ambulatory Visit (HOSPITAL_BASED_OUTPATIENT_CLINIC_OR_DEPARTMENT_OTHER): Payer: PPO

## 2015-12-18 ENCOUNTER — Encounter: Payer: Self-pay | Admitting: *Deleted

## 2015-12-18 ENCOUNTER — Ambulatory Visit (HOSPITAL_BASED_OUTPATIENT_CLINIC_OR_DEPARTMENT_OTHER): Payer: PPO

## 2015-12-18 ENCOUNTER — Other Ambulatory Visit: Payer: Self-pay | Admitting: Oncology

## 2015-12-18 VITALS — BP 131/62 | HR 73 | Temp 97.8°F | Resp 18

## 2015-12-18 DIAGNOSIS — C50211 Malignant neoplasm of upper-inner quadrant of right female breast: Secondary | ICD-10-CM

## 2015-12-18 DIAGNOSIS — Z5112 Encounter for antineoplastic immunotherapy: Secondary | ICD-10-CM

## 2015-12-18 DIAGNOSIS — Z5111 Encounter for antineoplastic chemotherapy: Secondary | ICD-10-CM

## 2015-12-18 LAB — CBC WITH DIFFERENTIAL/PLATELET
BASO%: 0 % (ref 0.0–2.0)
Basophils Absolute: 0 10*3/uL (ref 0.0–0.1)
EOS%: 0 % (ref 0.0–7.0)
Eosinophils Absolute: 0 10*3/uL (ref 0.0–0.5)
HCT: 44 % (ref 34.8–46.6)
HGB: 14.8 g/dL (ref 11.6–15.9)
LYMPH%: 9.3 % — AB (ref 14.0–49.7)
MCH: 27 pg (ref 25.1–34.0)
MCHC: 33.6 g/dL (ref 31.5–36.0)
MCV: 80.1 fL (ref 79.5–101.0)
MONO#: 0.2 10*3/uL (ref 0.1–0.9)
MONO%: 1.9 % (ref 0.0–14.0)
NEUT%: 88.8 % — ABNORMAL HIGH (ref 38.4–76.8)
NEUTROS ABS: 10 10*3/uL — AB (ref 1.5–6.5)
Platelets: 364 10*3/uL (ref 145–400)
RBC: 5.49 10*6/uL — AB (ref 3.70–5.45)
RDW: 13.5 % (ref 11.2–14.5)
WBC: 11.3 10*3/uL — AB (ref 3.9–10.3)
lymph#: 1.1 10*3/uL (ref 0.9–3.3)
nRBC: 0 % (ref 0–0)

## 2015-12-18 LAB — COMPREHENSIVE METABOLIC PANEL
ALT: 18 U/L (ref 0–55)
AST: 17 U/L (ref 5–34)
Albumin: 3.9 g/dL (ref 3.5–5.0)
Alkaline Phosphatase: 58 U/L (ref 40–150)
Anion Gap: 12 mEq/L — ABNORMAL HIGH (ref 3–11)
BUN: 30.3 mg/dL — AB (ref 7.0–26.0)
CHLORIDE: 107 meq/L (ref 98–109)
CO2: 20 meq/L — AB (ref 22–29)
CREATININE: 0.9 mg/dL (ref 0.6–1.1)
Calcium: 9.8 mg/dL (ref 8.4–10.4)
EGFR: 71 mL/min/{1.73_m2} — ABNORMAL LOW (ref >90)
Glucose: 161 mg/dl — ABNORMAL HIGH (ref 70–140)
Potassium: 4.2 mEq/L (ref 3.5–5.1)
Sodium: 139 mEq/L (ref 136–145)
Total Bilirubin: 1.05 mg/dL (ref 0.20–1.20)
Total Protein: 7.3 g/dL (ref 6.4–8.3)

## 2015-12-18 MED ORDER — ACETAMINOPHEN 325 MG PO TABS
ORAL_TABLET | ORAL | Status: AC
  Filled 2015-12-18: qty 2

## 2015-12-18 MED ORDER — ACETAMINOPHEN 325 MG PO TABS
650.0000 mg | ORAL_TABLET | Freq: Once | ORAL | Status: AC
  Administered 2015-12-18: 650 mg via ORAL

## 2015-12-18 MED ORDER — DIPHENHYDRAMINE HCL 25 MG PO CAPS
ORAL_CAPSULE | ORAL | Status: AC
Start: 2015-12-18 — End: 2015-12-18
  Filled 2015-12-18: qty 1

## 2015-12-18 MED ORDER — SODIUM CHLORIDE 0.9 % IV SOLN
8.0000 mg/kg | Freq: Once | INTRAVENOUS | Status: AC
  Administered 2015-12-18: 924 mg via INTRAVENOUS
  Filled 2015-12-18: qty 44

## 2015-12-18 MED ORDER — SODIUM CHLORIDE 0.9 % IV SOLN
Freq: Once | INTRAVENOUS | Status: AC
  Administered 2015-12-18: 10:00:00 via INTRAVENOUS

## 2015-12-18 MED ORDER — PEGFILGRASTIM 6 MG/0.6ML ~~LOC~~ PSKT
6.0000 mg | PREFILLED_SYRINGE | Freq: Once | SUBCUTANEOUS | Status: AC
  Administered 2015-12-18: 6 mg via SUBCUTANEOUS
  Filled 2015-12-18: qty 0.6

## 2015-12-18 MED ORDER — SODIUM CHLORIDE 0.9 % IV SOLN
840.0000 mg | Freq: Once | INTRAVENOUS | Status: AC
  Administered 2015-12-18: 840 mg via INTRAVENOUS
  Filled 2015-12-18: qty 28

## 2015-12-18 MED ORDER — SODIUM CHLORIDE 0.9 % IV SOLN
633.0000 mg | Freq: Once | INTRAVENOUS | Status: AC
  Administered 2015-12-18: 630 mg via INTRAVENOUS
  Filled 2015-12-18: qty 63

## 2015-12-18 MED ORDER — DIPHENHYDRAMINE HCL 25 MG PO CAPS
25.0000 mg | ORAL_CAPSULE | Freq: Once | ORAL | Status: AC
  Administered 2015-12-18: 25 mg via ORAL

## 2015-12-18 MED ORDER — DOCETAXEL CHEMO INJECTION 160 MG/16ML
75.0000 mg/m2 | Freq: Once | INTRAVENOUS | Status: AC
  Administered 2015-12-18: 170 mg via INTRAVENOUS
  Filled 2015-12-18: qty 17

## 2015-12-18 MED ORDER — SODIUM CHLORIDE 0.9 % IV SOLN
Freq: Once | INTRAVENOUS | Status: AC
  Administered 2015-12-18: 15:00:00 via INTRAVENOUS
  Filled 2015-12-18: qty 8

## 2015-12-18 MED ORDER — HEPARIN SOD (PORK) LOCK FLUSH 100 UNIT/ML IV SOLN
500.0000 [IU] | Freq: Once | INTRAVENOUS | Status: AC | PRN
  Administered 2015-12-18: 500 [IU]
  Filled 2015-12-18: qty 5

## 2015-12-18 MED ORDER — SODIUM CHLORIDE 0.9 % IJ SOLN
10.0000 mL | INTRAMUSCULAR | Status: DC | PRN
  Administered 2015-12-18: 10 mL
  Filled 2015-12-18: qty 10

## 2015-12-18 NOTE — Patient Instructions (Signed)
[facility] Discharge Instructions for Patients Receiving Chemotherapy  Today you received the following chemotherapy agents:  Carboplatin, Taxotere, Herceptin, Perjeta  To help prevent nausea and vomiting after your treatment, we encourage you to take your nausea medication as prescribed.   If you develop nausea and vomiting that is not controlled by your nausea medication, call the clinic.   BELOW ARE SYMPTOMS THAT SHOULD BE REPORTED IMMEDIATELY:  *FEVER GREATER THAN 100.5 F  *CHILLS WITH OR WITHOUT FEVER  NAUSEA AND VOMITING THAT IS NOT CONTROLLED WITH YOUR NAUSEA MEDICATION  *UNUSUAL SHORTNESS OF BREATH  *UNUSUAL BRUISING OR BLEEDING  TENDERNESS IN MOUTH AND THROAT WITH OR WITHOUT PRESENCE OF ULCERS  *URINARY PROBLEMS  *BOWEL PROBLEMS  UNUSUAL RASH Items with * indicate a potential emergency and should be followed up as soon as possible.  Feel free to call the clinic you have any questions or concerns. The clinic phone number is [phone].  Please show the [facility] at check-in to the Emergency Department and triage nurse.

## 2015-12-20 ENCOUNTER — Ambulatory Visit: Payer: PPO

## 2015-12-22 ENCOUNTER — Other Ambulatory Visit: Payer: Self-pay | Admitting: *Deleted

## 2015-12-22 DIAGNOSIS — C50211 Malignant neoplasm of upper-inner quadrant of right female breast: Secondary | ICD-10-CM

## 2015-12-25 ENCOUNTER — Ambulatory Visit (HOSPITAL_BASED_OUTPATIENT_CLINIC_OR_DEPARTMENT_OTHER): Payer: PPO | Admitting: Oncology

## 2015-12-25 ENCOUNTER — Other Ambulatory Visit (HOSPITAL_BASED_OUTPATIENT_CLINIC_OR_DEPARTMENT_OTHER): Payer: PPO

## 2015-12-25 VITALS — BP 135/74 | HR 102 | Temp 99.8°F | Resp 18 | Ht 66.0 in | Wt 236.7 lb

## 2015-12-25 DIAGNOSIS — C50511 Malignant neoplasm of lower-outer quadrant of right female breast: Secondary | ICD-10-CM

## 2015-12-25 DIAGNOSIS — R197 Diarrhea, unspecified: Secondary | ICD-10-CM

## 2015-12-25 DIAGNOSIS — R110 Nausea: Secondary | ICD-10-CM

## 2015-12-25 DIAGNOSIS — C773 Secondary and unspecified malignant neoplasm of axilla and upper limb lymph nodes: Secondary | ICD-10-CM

## 2015-12-25 DIAGNOSIS — C50211 Malignant neoplasm of upper-inner quadrant of right female breast: Secondary | ICD-10-CM

## 2015-12-25 DIAGNOSIS — R5383 Other fatigue: Secondary | ICD-10-CM

## 2015-12-25 LAB — COMPREHENSIVE METABOLIC PANEL
ALBUMIN: 3.7 g/dL (ref 3.5–5.0)
ALK PHOS: 90 U/L (ref 40–150)
ALT: 31 U/L (ref 0–55)
AST: 20 U/L (ref 5–34)
Anion Gap: 9 mEq/L (ref 3–11)
BILIRUBIN TOTAL: 1.26 mg/dL — AB (ref 0.20–1.20)
BUN: 15.8 mg/dL (ref 7.0–26.0)
CALCIUM: 9.4 mg/dL (ref 8.4–10.4)
CO2: 28 mEq/L (ref 22–29)
Chloride: 103 mEq/L (ref 98–109)
Creatinine: 1 mg/dL (ref 0.6–1.1)
EGFR: 62 mL/min/{1.73_m2} — AB (ref >90)
GLUCOSE: 120 mg/dL (ref 70–140)
Potassium: 4.6 mEq/L (ref 3.5–5.1)
SODIUM: 139 meq/L (ref 136–145)
TOTAL PROTEIN: 6.6 g/dL (ref 6.4–8.3)

## 2015-12-25 LAB — CBC WITH DIFFERENTIAL/PLATELET
BASO%: 0.5 % (ref 0.0–2.0)
BASOS ABS: 0.1 10*3/uL (ref 0.0–0.1)
EOS ABS: 0.1 10*3/uL (ref 0.0–0.5)
EOS%: 0.6 % (ref 0.0–7.0)
HEMATOCRIT: 46.7 % — AB (ref 34.8–46.6)
HEMOGLOBIN: 15.3 g/dL (ref 11.6–15.9)
LYMPH#: 2.6 10*3/uL (ref 0.9–3.3)
LYMPH%: 16.1 % (ref 14.0–49.7)
MCH: 26.3 pg (ref 25.1–34.0)
MCHC: 32.8 g/dL (ref 31.5–36.0)
MCV: 80 fL (ref 79.5–101.0)
MONO#: 2.2 10*3/uL — AB (ref 0.1–0.9)
MONO%: 13.5 % (ref 0.0–14.0)
NEUT%: 69.3 % (ref 38.4–76.8)
NEUTROS ABS: 11.2 10*3/uL — AB (ref 1.5–6.5)
PLATELETS: 230 10*3/uL (ref 145–400)
RBC: 5.83 10*6/uL — ABNORMAL HIGH (ref 3.70–5.45)
RDW: 13.7 % (ref 11.2–14.5)
WBC: 16.2 10*3/uL — AB (ref 3.9–10.3)

## 2015-12-25 MED ORDER — CHOLESTYRAMINE 4 G PO PACK: PACK | ORAL | Status: DC

## 2015-12-25 NOTE — Progress Notes (Signed)
[facility] [address] [facility] [address]  [phone] [phone]   ID: ASHEY TRAMONTANA DOB: 03/03/1951  MR#: [id number]  GFQ#:[id number]  Patient Care Team: No Pcp Per Patient as PCP - General (General Practice) Chauncey Cruel, MD as Consulting Physician (Oncology) Rolm Bookbinder, MD as Consulting Physician (General Surgery) Thea Silversmith, MD as Consulting Physician (Radiation Oncology) Princess Bruins, MD as Consulting Physician (Obstetrics and Gynecology) Jolaine Artist, MD as Consulting Physician (Cardiology) PCP: No PCP Per Patient GYN: SU:  OTHER MD:  CHIEF COMPLAINT: right breast cancer  CURRENT TREATMENT: neoadjuvant chemotherapy  BREAST CANCER HISTORY: From the original intake note:  "Terri Mayer" underwent bilateral screening mammography with tomosynthesis at the breast center 11/02/2015 showing a possible mass in the right breast and low right axillary area. She was called back for right diagnostic mammography with tomosynthesis and right ultrasonography 11/14/2015. The breast density was category B. There was a lobulated mass in the lateral right breast, with a smaller oval mass immediately adjacent to it. Also in the upper outer quadrant there was a third mass. This has been stable back to 2014 and is felt to be a fibroadenoma.  Ultrasound of the right breast confirmed a hypoechoic mass at the 3:00 position 6 cm from the nipple, measuring 1.4 cm. Adjacent to this there was a 0.5 cm internal mammary lymph node, with normal morphology and normal hilar fat. The mass at the 10:00 position measured 0.8 cm and again was consistent with a fibroadenoma. In the right axilla there was an abnormal lymph node with no internal fat measuring 1.8 cm. The rest of the exam was unremarkable.  Biopsy of the right breast 8:30 o'clock lesion and the right axillary lymph node on 11/16/2015 showed ([id number]) both to be positive for invasive ductal carcinoma, grade 3, the breast mass  being estrogen receptor 10% positive with moderate staining intensity, but progesterone receptor negative, but the lymph node being estrogen and progesterone both negative. Both the breast and lymph nodes had MIB-1 of 30%. Both were positive for HER-2 amplification, the signals ratio is being 6.06 and 7.61, and the number per cell 10.00 and 11.80.  The patient's subsequent history is as detailed below   INTERVAL HISTORY: Terri Mayer returns for follow up of her HER-2 positive cancer accompanied by a friend. Today is day 8 cycle 1 of 6 planned cycles of carboplatin, docetaxel, trastuzumab and pertuzumab. She took the dexamethasone on day 0 and slept well with no lorazepam. The next day, chemotherapy day, she did fine but then could not sleep that night. She felt very tired on Tuesday and did take the Ativan with some success. She had no significant problems until day 4. That is when she started having nausea fatigue and diarrhea. She did vomit once or twice. Things don't taste good. She was not using the prochlorperazine or density on at that point. She still a bit tired, but she is sleeping a little bit more continuously these last few days. Diarrhea started on Saturday, which is day 6, and she is using Imodium to control it. It is grade 1. It does "irritate her bottom". She is having a little trouble concentrating at times, doubtless from the medications. She feels achy. She has a little bit of a dry cough. She has stress urinary incontinence which is not a new problem. Otherwise a detailed review of systems today was stable  REVIEW OF SYSTEMS: Aside from the problems noted above, a detailed review of systems today was noncontributory  PAST MEDICAL HISTORY: Past Medical History  Diagnosis Date  . History of chicken pox   . Allergy   . Breast cancer ([facility])   . Osteoarthritis     Neck,Back,Knees,Hips,Ankles    PAST SURGICAL HISTORY: Past Surgical History  Procedure Laterality Date  . Eye surgery  at  age 58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  . Wisdom tooth extraction    . Dilation and curettage of uterus    . Portacath placement Right 12/11/2015    Procedure: INSERTION PORT-A-CATH WITH ULTRASOUND;  Surgeon: Rolm Bookbinder, MD;  Location: [facility];  Service: General;  Laterality: Right;    FAMILY HISTORY Family History  Problem Relation Age of Onset  . Heart disease Father   . Diabetes      GM  . Colon cancer Neg Hx   . Breast cancer Neg Hx   . Dementia      M  . Transient ischemic attack      F   the patient's father died after multiple strokes at the age of 32. The patient's mother died at the age of 29 with Alzheimer's disease. The patient had one brother, one sister. The patient's paternal grandfather died from lung cancer in his 70s. On the mother's side there are 2 (second) cousins with breast cancer diagnosed in their early 62s, and a more remote cousin diagnosed with breast cancer at age 88. There is no history of ovarian cancer.   GYNECOLOGIC HISTORY:  No LMP recorded. Patient is postmenopausal.  menarche age 72, the patient is GX P0 and she understands that not carrying a child to term before the age of 80 essentially doubled the risk of breast cancer. She stopped having periods between age 46 and 46 and did not take hormone replacement. 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 She lives by herself, with her pet dog. Her son, adopted at birth, Terri Mayer, currently 20, attends [employer] in the psychology Department.     ADVANCED DIRECTIVES: Not in place. At the 12/25/2015 clinic visit the patient was given the appropriate forms to complete and notarize at her discretion.    HEALTH MAINTENANCE: Social History  Substance Use Topics  . Smoking status: Never Smoker   . Smokeless tobacco: Never Used  . Alcohol Use: Yes      Comment: rarely      Colonoscopy: 12/28/2004  PAP:  Bone density:  Lipid panel:  Allergies  Allergen Reactions  . Codeine Nausea And Vomiting    Current Outpatient Prescriptions  Medication Sig Dispense Refill  . clobetasol cream (TEMOVATE) 1.76 % Apply 1 application topically 2 (two) times daily.   4  . dexamethasone (DECADRON) 4 MG tablet Take 2 tablets (8 mg total) by mouth 2 (two) times daily. Start the day before Taxotere. Then again the day after chemo for 3 days. 30 tablet 1  . IBUPROFEN PO Take by mouth as needed.     . lidocaine-prilocaine (EMLA) cream Apply to affected area once 30 g 3  . LORazepam (ATIVAN) 0.5 MG tablet Take 1 tablet (0.5 mg total) by mouth at bedtime as needed (Nausea or vomiting). 30 tablet 0  . ondansetron (ZOFRAN) 8 MG tablet Take 1 tablet (8 mg total) by mouth 2 (two) times daily. Start the day after chemo for 3 days. Then take  as needed for nausea or vomiting. 30 tablet 1  . prochlorperazine (COMPAZINE) 10 MG tablet Take 1 tablet (10 mg total) by mouth every 6 (six) hours as needed (Nausea or vomiting). 30 tablet 1   No current facility-administered medications for this visit.    OBJECTIVE: Middle-aged white woman who appears stated age  29 Vitals:   12/25/15 0955  BP: 135/74  Pulse: 102  Temp: 99.8 F (37.7 C)  Resp: 18     Body mass index is 38.22 kg/(m^2).    ECOG FS:1 - Symptomatic but completely ambulatory  Sclerae unicteric, pupils round and equal Oropharynx clear and moist-- no thrush or other lesions No cervical or supraclavicular adenopathy Lungs no rales or rhonchi Heart regular rate and rhythm Abd soft, nontender, positive bowel sounds MSK no focal spinal tenderness, no upper extremity lymphedema Neuro: nonfocal, well oriented, appropriate affect Breasts: Deferred   LAB RESULTS:  CMP     Component Value Date/Time   NA 139 12/25/2015 0932   K 4.6 12/25/2015 0932   CO2 28 12/25/2015 0932   GLUCOSE 120 12/25/2015 0932     BUN 15.8 12/25/2015 0932   CREATININE 1.0 12/25/2015 0932   CALCIUM 9.4 12/25/2015 0932   PROT 6.6 12/25/2015 0932   ALBUMIN 3.7 12/25/2015 0932   AST 20 12/25/2015 0932   ALT 31 12/25/2015 0932   ALKPHOS 90 12/25/2015 0932   BILITOT 1.26* 12/25/2015 0932    INo results found for: SPEP, UPEP  Lab Results  Component Value Date   WBC 16.2* 12/25/2015   NEUTROABS 11.2* 12/25/2015   HGB 15.3 12/25/2015   HCT 46.7* 12/25/2015   MCV 80.0 12/25/2015   PLT 230 12/25/2015      Chemistry      Component Value Date/Time   NA 139 12/25/2015 0932   K 4.6 12/25/2015 0932   CO2 28 12/25/2015 0932   BUN 15.8 12/25/2015 0932   CREATININE 1.0 12/25/2015 0932      Component Value Date/Time   CALCIUM 9.4 12/25/2015 0932   ALKPHOS 90 12/25/2015 0932   AST 20 12/25/2015 0932   ALT 31 12/25/2015 0932   BILITOT 1.26* 12/25/2015 0932       No results found for: LABCA2  No components found for: LABCA125  No results for input(s): INR in the last 168 hours.  Urinalysis No results found for: COLORURINE, APPEARANCEUR, LABSPEC, PHURINE, GLUCOSEU, HGBUR, BILIRUBINUR, KETONESUR, PROTEINUR, UROBILINOGEN, NITRITE, LEUKOCYTESUR  STUDIES: Dg Eye Foreign Body  12/04/2015  CLINICAL DATA:  Metal working/exposure; clearance prior to MRI EXAM: ORBITS FOR FOREIGN BODY - 2 VIEW COMPARISON:  None. FINDINGS: There is no evidence of metallic foreign body within the orbits. No significant bone abnormality identified. IMPRESSION: No evidence of metallic foreign body within the orbits. Electronically Signed   By: Lorriane Shire M.D.   On: 12/04/2015 07:59   Mr Breast Bilateral W Wo Contrast  12/04/2015  CLINICAL DATA:  65 year old patient with recent diagnosis of right breast cancer at 8:30 position (1.4 cm) and metastasis to a right axillary lymph node following ultrasound-guided biopsies. Additionally, an intramammary lymph node measuring 5 mm was noted immediately adjacent to the biopsy-proven carcinoma.  A mammographically stable circumscribed mass in the 10 o'clock position of the right breast measures 8 mm greatest diameter and was felt to be a fibroadenoma given its appearances and stability. The patient presents for breast MRI prior to initiating neoadjuvant chemotherapy. LABS:  None obtained EXAM: BILATERAL BREAST MRI WITH AND WITHOUT CONTRAST TECHNIQUE:  Multiplanar, multisequence MR images of both breasts were obtained prior to and following the intravenous administration of 22 ml of MultiHance. THREE-DIMENSIONAL MR IMAGE RENDERING ON INDEPENDENT WORKSTATION: Three-dimensional MR images were rendered by post-processing of the original MR data on an independent workstation. The three-dimensional MR images were interpreted, and findings are reported in the following complete MRI report for this study. Three dimensional images were evaluated at the independent Dyn[employer] workstation COMPARISON:  Previous exam(s). FINDINGS: Breast composition: b. Scattered fibroglandular tissue. Background parenchymal enhancement: Mild Right breast: In the lower outer quadrant of the right breast, middle third, is an irregular heterogeneously enhancing mass with central biopsy clip artifact. The mass measures 1.5 cm AP x 1.4 cm transverse x 1.5 cm craniocaudal and corresponds to the biopsy-proven malignancy. This mass demonstrates washout kinetics. 5 mm directly anterior and lateral to the biopsy-proven carcinoma is a 10 x 9 x 9 mm intramammary lymph node. The fatty hilum appears relatively small compared to the cortical thickness. A metastasis to the intramammary lymph node cannot be excluded. In the upper-outer quadrant of the right breast, anterior third, is a circumscribed 6 x 3 x 7 mm enhancing oval mass. This corresponds to mammographically stable circumscribed nodule and may be a benign intramammary lymph node or fibroadenoma. Left breast: No mass or abnormal enhancement. Lymph nodes: In the inferior right axilla is an  enlarged level 1 axillary lymph node with marked abnormal cortical thickening and central biopsy clip artifact. This lymph node measures 2.5 x 2.0 x 2.1 cm. No additional enlarged axillary lymph nodes are seen on the right. Left axillary lymph nodes are within normal limits. No internal mammary chain lymphadenopathy. Ancillary findings:  None. IMPRESSION: 1. Biopsy proven invasive mammary carcinoma in the lower outer quadrant of the right breast measures 1.5 cm greatest diameter. Contains central biopsy clip artifact. 2. Biopsy proven metastatic level 1 right axillary lymph node with central biopsy clip artifact measures 2.5 cm greatest diameter. 3. 10 x 9 x 9 mm intramammary lymph node in the right breast 5 mm directly anterior and lateral to the biopsy carcinoma. Involvement of this lymph node with carcinoma cannot be excluded. 4. No MRI evidence of malignancy in the contralateral left breast. RECOMMENDATION: Continue treatment planning. BI-RADS CATEGORY  6: Known biopsy-proven malignancy. Electronically Signed   By: Curlene Dolphin M.D.   On: 12/04/2015 10:34   Dg Chest Port 1 View  12/11/2015  CLINICAL DATA:  Port-A-Cath placement, postop.  Breast cancer. EXAM: PORTABLE CHEST 1 VIEW COMPARISON:  None. FINDINGS: Right internal jugular Port-A-Cath has been placed with the tip in the upper right atrium. No pneumothorax. There is cardiomegaly. Patchy airspace disease bilaterally, most pronounced in the left upper lobe/ lingula and in the right perihilar/ infrahilar region. Suspect small left pleural effusion. No right effusion. No acute bony abnormality. IMPRESSION: Right Port-A-Cath tip in the upper right atrium.  No pneumothorax. Cardiomegaly. Patchy bilateral airspace opacities, left greater than right. This could represent asymmetric edema or infection. Suspect small left effusion. Electronically Signed   By: Rolm Baptise M.D.   On: 12/11/2015 11:23   Dg Fluoro Guide Cv Line-no Report  12/11/2015  CLINICAL  DATA:  FLOURO GUIDE CV LINE Fluoroscopy was utilized by the requesting physician.  No radiographic interpretation.    ASSESSMENT: 65 y.o. [place] woman status post right breast lower outer quadrant and right axillary lymph node biopsy 11/16/2015, also positive for a clinical  T1c pN1, stage IIA invasive ductal carcinoma grade 3, essentially estrogen and  progesterone receptor negative but HER-2 amplified, with an MIB-1 of 30%  (a) the breast lesion had moderate positivity for the estrogen receptor at 10%; the axillary lymph node lesion was estrogen receptor negative. Both were progesterone receptor negative  (1) neoadjuvant chemotherapy with carboplatin, docetaxel, trastuzumab and pertuzumab for 6 cycles, started 12/18/2015  (2) trastuzumab to be continued to total one year  (a) baseline echocardiogram on 12/12/15 showed an EF of 60-65%  (3) breast conserving surgery with consideration of the Alliance trial to follow  (4) adjuvant radiation as appropriate   PLAN: Terri Mayer did pretty well with her first cycle of chemotherapy. She can do a little bit better though, and that is why we made the following changes today.  She is going to be taking her lorazepam on days 0 through 3, or whenever she takes the full dose of dexamethasone.  I reassured her that her diarrhea was not her fault, but due to the pertuzumab. She is appropriately using Imodium. I added Questran to her regimen and she may take this twice a day if she has any diarrhea. I find this is the most effective way to deal with this problem although patients disliked the medication. She will let me know how it works for her.  She will take the dexamethasone and ondansetron as prescribed the first 3 days but after 3 days of ondansetron she will switch to prochlorperazine. If that makes her too groggy she will take half a dose. She will continue thus until she has absolutely no nausea problems.  The other change or making is on days 45 and 6  she will take dexamethasone 2 mg with breakfast. I think this will help with the fatigue and nausea that she had those days. Obviously it will not help with the diarrhea problem  There is nothing much we can do about the taste perversion, which is going to last little bit beyond her completion of chemotherapy. From our discussion today and it sounds like she likes our better than salt or suite so I suggested she use a little bit of lemon juice on her food and see if that helps.   Otherwise she is already scheduled for cycle 22 weeks from now. She knows to call for any problems that may develop before that visit. Chauncey Cruel, MD   12/25/2015 10:30 AM

## 2015-12-29 ENCOUNTER — Encounter: Payer: Self-pay | Admitting: Internal Medicine

## 2015-12-29 ENCOUNTER — Ambulatory Visit (INDEPENDENT_AMBULATORY_CARE_PROVIDER_SITE_OTHER): Payer: PPO | Admitting: Internal Medicine

## 2015-12-29 VITALS — BP 126/84 | HR 90 | Temp 98.9°F | Resp 12 | Ht 66.0 in | Wt 234.0 lb

## 2015-12-29 DIAGNOSIS — R300 Dysuria: Secondary | ICD-10-CM

## 2015-12-29 DIAGNOSIS — E669 Obesity, unspecified: Secondary | ICD-10-CM | POA: Diagnosis not present

## 2015-12-29 DIAGNOSIS — L989 Disorder of the skin and subcutaneous tissue, unspecified: Secondary | ICD-10-CM

## 2015-12-29 DIAGNOSIS — R238 Other skin changes: Secondary | ICD-10-CM | POA: Insufficient documentation

## 2015-12-29 DIAGNOSIS — R197 Diarrhea, unspecified: Secondary | ICD-10-CM

## 2015-12-29 LAB — POCT URINALYSIS DIPSTICK
Bilirubin, UA: NEGATIVE
Blood, UA: POSITIVE
Glucose, UA: NEGATIVE
Ketones, UA: NEGATIVE
Leukocytes, UA: NEGATIVE
Nitrite, UA: NEGATIVE
PH UA: 6
PROTEIN UA: POSITIVE
UROBILINOGEN UA: NEGATIVE

## 2015-12-29 MED ORDER — FLUCONAZOLE 150 MG PO TABS: 150.0000 mg | ORAL_TABLET | ORAL | Status: DC

## 2015-12-29 MED ORDER — NYSTATIN-TRIAMCINOLONE 100000-0.1 UNIT/GM-% EX OINT: 1.0000 "application " | TOPICAL_OINTMENT | Freq: Two times a day (BID) | CUTANEOUS | Status: DC

## 2015-12-29 MED FILL — FLUCONAZOLE 150 MG TABLET: 150 | 15 days supply | Qty: 5 | Fill #0

## 2015-12-29 MED FILL — NYSTATIN-TRIAMCINOLONE OINT: 100000-0.1 | 10 days supply | Qty: 30 | Fill #0

## 2015-12-29 NOTE — Assessment & Plan Note (Signed)
She has changed diet and eating better now and undergoing chemo but her 20 pound weight loss in less than 1 month is still concerning. Talked to her about the need for proper nutrition during chemotherapy and will continue to monitor.

## 2015-12-29 NOTE — Progress Notes (Signed)
   Subjective:    Patient ID: Terri Mayer, female    DOB: Apr 08, 1951, 65 y.o.   MRN: [id number]  HPI The patient is a 65 YO female coming in new for several concerns. She is undergoing chemotherapy for HER-2 positive breast cancer. She just finished cycle 1 of 6. She is having diarrhea with eating. She denies any blood in the diarrhea. She has some hemorrhoids which are mildly elevated with the diarrhea and some raw skin. She is also having worsening yeast skin infection in her pelvic area and a lot of pain on urination. She has kept a mild yeast infection in that area for some time. She had not sought treatment for it. She has some triamcinolone cream that is 66 years old which she has been using for the last 1-2 days. Denies fevers or chills. Some mild abdomen discomfort with the diarrhea. She has tried imodium per her oncologist but cannot remember to take it and is worried that it will make her stomach symptoms worse. She has given up sugar and improved her diet. She has lost 20 pounds in the last month (partially intentionally).   PMH, [facility], social history reviewed and updated.   Review of Systems  Constitutional: Positive for activity change, appetite change and unexpected weight change. Negative for fever, chills and fatigue.  HENT: Negative.   Eyes: Negative.   Respiratory: Negative for cough, chest tightness, shortness of breath and wheezing.   Cardiovascular: Negative for chest pain, palpitations and leg swelling.  Gastrointestinal: Positive for nausea, abdominal pain and diarrhea. Negative for vomiting, constipation, blood in stool, abdominal distention and rectal pain.  Genitourinary: Positive for dysuria.  Musculoskeletal: Negative for myalgias, back pain, arthralgias and neck pain.  Skin: Negative.   Neurological: Negative.   Psychiatric/Behavioral: Positive for dysphoric mood. Negative for behavioral problems, confusion, sleep disturbance and decreased concentration. The patient  is not nervous/anxious.       Objective:   Physical Exam  Constitutional: She is oriented to person, place, and time. She appears well-developed and well-nourished.  Overweight  HENT:  Head: Normocephalic and atraumatic.  Eyes: EOM are normal.  Neck: Normal range of motion.  Cardiovascular: Normal rate and regular rhythm.   Pulmonary/Chest: Effort normal and breath sounds normal. No respiratory distress. She has no wheezes. She has no rales.  Abdominal: Soft. Bowel sounds are normal. She exhibits no distension. There is no tenderness. There is no rebound.  Musculoskeletal: She exhibits no edema.  Neurological: She is alert and oriented to person, place, and time. Coordination normal.  Skin: Skin is warm and dry.  Psychiatric: She has a normal mood and affect.   Filed Vitals:   12/29/15 0838  BP: 126/84  Pulse: 90  Temp: 98.9 F (37.2 C)  TempSrc: Oral  Resp: 12  Height: '5\' 6"'$  (1.676 m)  Weight: 234 lb (106.142 kg)  SpO2: 96%      Assessment & Plan:

## 2015-12-29 NOTE — Assessment & Plan Note (Signed)
Checking U/A today which is negative for signs of infection. Will treat the skin infection/irritation and check for resolution.

## 2015-12-29 NOTE — Assessment & Plan Note (Signed)
She has been advised on imodium and cholestyramine for her diarrhea by her oncologist. She is not taking either right now. Advised her that she can go back to taking the imodium then add cholestyramine if needed. Advised her that it is okay to not have the diarrhea and this will not impact her stomach.

## 2015-12-29 NOTE — Patient Instructions (Signed)
We will check the urine today for infection and call you back about the results.   We have sent in an ointment called mycolog (triamcinolone/nystatin) ointment that you can apply twice a day to help the raw area heal.   We have also sent in diflucan pills for the yeast. Take 1 pill today, then 1 pill Monday, then 1 pill Thursday, then 1 pill Sunday, then 1 pill Wednesday. This will help to accelerate the healing.

## 2015-12-29 NOTE — Assessment & Plan Note (Signed)
Rx for mycolog and diflucan to help with healing. It is unclear if this represents the sole cause of the dysuria and will work up for underlying infection (see separate problem).

## 2015-12-29 NOTE — Progress Notes (Signed)
Pre visit review using our clinic review tool, if applicable. No additional management support is needed unless otherwise documented below in the visit note. 

## 2016-01-01 ENCOUNTER — Telehealth: Payer: Self-pay | Admitting: Internal Medicine

## 2016-01-01 NOTE — Telephone Encounter (Signed)
Left message for patient to call back. She did not have any infection in her urine. Need to notify.

## 2016-01-01 NOTE — Telephone Encounter (Signed)
Patient is requesting call back ASAP as soon as possible with urine culture results.  States she is on cemo and needs to know as soon as possible.

## 2016-01-02 NOTE — Telephone Encounter (Signed)
Patient called back.  Told her she did not have infection. Patient does not need call back.

## 2016-01-05 ENCOUNTER — Other Ambulatory Visit: Payer: Self-pay

## 2016-01-05 DIAGNOSIS — C50211 Malignant neoplasm of upper-inner quadrant of right female breast: Secondary | ICD-10-CM

## 2016-01-08 ENCOUNTER — Ambulatory Visit (HOSPITAL_BASED_OUTPATIENT_CLINIC_OR_DEPARTMENT_OTHER): Payer: PPO | Admitting: Nurse Practitioner

## 2016-01-08 ENCOUNTER — Encounter: Payer: Self-pay | Admitting: Nurse Practitioner

## 2016-01-08 ENCOUNTER — Other Ambulatory Visit (HOSPITAL_BASED_OUTPATIENT_CLINIC_OR_DEPARTMENT_OTHER): Payer: PPO

## 2016-01-08 ENCOUNTER — Encounter: Payer: Self-pay | Admitting: *Deleted

## 2016-01-08 ENCOUNTER — Other Ambulatory Visit: Payer: Self-pay | Admitting: Oncology

## 2016-01-08 ENCOUNTER — Ambulatory Visit (HOSPITAL_BASED_OUTPATIENT_CLINIC_OR_DEPARTMENT_OTHER): Payer: PPO

## 2016-01-08 ENCOUNTER — Other Ambulatory Visit: Payer: Self-pay | Admitting: *Deleted

## 2016-01-08 VITALS — BP 122/60 | HR 90 | Temp 98.3°F | Resp 18 | Wt 241.9 lb

## 2016-01-08 DIAGNOSIS — C773 Secondary and unspecified malignant neoplasm of axilla and upper limb lymph nodes: Secondary | ICD-10-CM

## 2016-01-08 DIAGNOSIS — B373 Candidiasis of vulva and vagina: Secondary | ICD-10-CM | POA: Diagnosis not present

## 2016-01-08 DIAGNOSIS — C50211 Malignant neoplasm of upper-inner quadrant of right female breast: Secondary | ICD-10-CM

## 2016-01-08 DIAGNOSIS — Z5111 Encounter for antineoplastic chemotherapy: Secondary | ICD-10-CM | POA: Diagnosis not present

## 2016-01-08 DIAGNOSIS — B3731 Acute candidiasis of vulva and vagina: Secondary | ICD-10-CM

## 2016-01-08 DIAGNOSIS — Z5112 Encounter for antineoplastic immunotherapy: Secondary | ICD-10-CM

## 2016-01-08 LAB — CBC WITH DIFFERENTIAL/PLATELET
BASO%: 0.2 % (ref 0.0–2.0)
BASOS ABS: 0 10*3/uL (ref 0.0–0.1)
EOS ABS: 0 10*3/uL (ref 0.0–0.5)
EOS%: 0 % (ref 0.0–7.0)
HEMATOCRIT: 43.1 % (ref 34.8–46.6)
HEMOGLOBIN: 14 g/dL (ref 11.6–15.9)
LYMPH#: 1.1 10*3/uL (ref 0.9–3.3)
LYMPH%: 6.4 % — ABNORMAL LOW (ref 14.0–49.7)
MCH: 26.1 pg (ref 25.1–34.0)
MCHC: 32.4 g/dL (ref 31.5–36.0)
MCV: 80.4 fL (ref 79.5–101.0)
MONO#: 0.2 10*3/uL (ref 0.1–0.9)
MONO%: 1.3 % (ref 0.0–14.0)
NEUT%: 92.1 % — ABNORMAL HIGH (ref 38.4–76.8)
NEUTROS ABS: 16.3 10*3/uL — AB (ref 1.5–6.5)
Platelets: 316 10*3/uL (ref 145–400)
RBC: 5.36 10*6/uL (ref 3.70–5.45)
RDW: 14.1 % (ref 11.2–14.5)
WBC: 17.7 10*3/uL — AB (ref 3.9–10.3)

## 2016-01-08 LAB — COMPREHENSIVE METABOLIC PANEL
ALBUMIN: 3.6 g/dL (ref 3.5–5.0)
ALK PHOS: 80 U/L (ref 40–150)
ALT: 24 U/L (ref 0–55)
AST: 18 U/L (ref 5–34)
Anion Gap: 9 mEq/L (ref 3–11)
BILIRUBIN TOTAL: 0.67 mg/dL (ref 0.20–1.20)
BUN: 19.3 mg/dL (ref 7.0–26.0)
CALCIUM: 10 mg/dL (ref 8.4–10.4)
CO2: 24 mEq/L (ref 22–29)
Chloride: 108 mEq/L (ref 98–109)
Creatinine: 0.7 mg/dL (ref 0.6–1.1)
EGFR: 85 mL/min/{1.73_m2} — ABNORMAL LOW (ref >90)
Glucose: 129 mg/dl (ref 70–140)
POTASSIUM: 4.3 meq/L (ref 3.5–5.1)
SODIUM: 142 meq/L (ref 136–145)
TOTAL PROTEIN: 6.9 g/dL (ref 6.4–8.3)

## 2016-01-08 MED ORDER — SODIUM CHLORIDE 0.9 % IJ SOLN
10.0000 mL | INTRAMUSCULAR | Status: DC | PRN
  Administered 2016-01-08: 10 mL
  Filled 2016-01-08: qty 10

## 2016-01-08 MED ORDER — SODIUM CHLORIDE 0.9 % IV SOLN
Freq: Once | INTRAVENOUS | Status: AC
  Administered 2016-01-08: 10:00:00 via INTRAVENOUS

## 2016-01-08 MED ORDER — DIPHENHYDRAMINE HCL 25 MG PO CAPS
50.0000 mg | ORAL_CAPSULE | Freq: Once | ORAL | Status: AC
  Administered 2016-01-08: 50 mg via ORAL

## 2016-01-08 MED ORDER — NYSTATIN-TRIAMCINOLONE 100000-0.1 UNIT/GM-% EX OINT: 1.0000 "application " | TOPICAL_OINTMENT | Freq: Two times a day (BID) | CUTANEOUS | Status: DC

## 2016-01-08 MED ORDER — ACETAMINOPHEN 325 MG PO TABS
ORAL_TABLET | ORAL | Status: AC
  Filled 2016-01-08: qty 2

## 2016-01-08 MED ORDER — SODIUM CHLORIDE 0.9 % IV SOLN
Freq: Once | INTRAVENOUS | Status: AC
Start: 2016-01-08 — End: 2016-01-08
  Administered 2016-01-08: 10:00:00 via INTRAVENOUS
  Filled 2016-01-08: qty 8

## 2016-01-08 MED ORDER — FLUCONAZOLE 150 MG PO TABS
150.0000 mg | ORAL_TABLET | ORAL | Status: DC
Start: 2016-01-08 — End: 2016-01-29

## 2016-01-08 MED ORDER — TRASTUZUMAB CHEMO INJECTION 440 MG
6.0000 mg/kg | Freq: Once | INTRAVENOUS | Status: AC
  Administered 2016-01-08: 693 mg via INTRAVENOUS
  Filled 2016-01-08: qty 33

## 2016-01-08 MED ORDER — SODIUM CHLORIDE 0.9 % IV SOLN
633.0000 mg | Freq: Once | INTRAVENOUS | Status: AC
  Administered 2016-01-08: 630 mg via INTRAVENOUS
  Filled 2016-01-08: qty 63

## 2016-01-08 MED ORDER — ACETAMINOPHEN 325 MG PO TABS
650.0000 mg | ORAL_TABLET | Freq: Once | ORAL | Status: AC
  Administered 2016-01-08: 650 mg via ORAL

## 2016-01-08 MED ORDER — SODIUM CHLORIDE 0.9 % IV SOLN
420.0000 mg | Freq: Once | INTRAVENOUS | Status: AC
  Administered 2016-01-08: 420 mg via INTRAVENOUS
  Filled 2016-01-08: qty 14

## 2016-01-08 MED ORDER — HEPARIN SOD (PORK) LOCK FLUSH 100 UNIT/ML IV SOLN
500.0000 [IU] | Freq: Once | INTRAVENOUS | Status: AC | PRN
  Administered 2016-01-08: 500 [IU]
  Filled 2016-01-08: qty 5

## 2016-01-08 MED ORDER — DOCETAXEL CHEMO INJECTION 160 MG/16ML
75.0000 mg/m2 | Freq: Once | INTRAVENOUS | Status: AC
  Administered 2016-01-08: 170 mg via INTRAVENOUS
  Filled 2016-01-08: qty 17

## 2016-01-08 MED ORDER — DIPHENHYDRAMINE HCL 25 MG PO CAPS
ORAL_CAPSULE | ORAL | Status: AC
  Filled 2016-01-08: qty 2

## 2016-01-08 MED ORDER — PEGFILGRASTIM 6 MG/0.6ML ~~LOC~~ PSKT
6.0000 mg | PREFILLED_SYRINGE | Freq: Once | SUBCUTANEOUS | Status: AC
  Administered 2016-01-08: 6 mg via SUBCUTANEOUS
  Filled 2016-01-08: qty 0.6

## 2016-01-08 MED FILL — NYSTATIN-TRIAMCINOLONE OINT: 100000-0.1 | 6 days supply | Qty: 30 | Fill #0

## 2016-01-08 NOTE — Progress Notes (Signed)
Terri Mayer  [phone] [phone]   ID: Terri Mayer DOB: 09-15-1951  MR#: [id number]  YCX#:[id number]  Patient Care Team: [name], MD as PCP - General (Internal Medicine) Chauncey Cruel, MD as Consulting Physician (Oncology) Rolm Bookbinder, MD as Consulting Physician (General Surgery) Thea Silversmith, MD as Consulting Physician (Radiation Oncology) Princess Bruins, MD as Consulting Physician (Obstetrics and Gynecology) Jolaine Artist, MD as Consulting Physician (Cardiology) PCP: [name], MD GYN: SU:  OTHER MD:  CHIEF COMPLAINT: right breast cancer  CURRENT TREATMENT: neoadjuvant chemotherapy  BREAST CANCER HISTORY: From the original intake note:  "Terri Mayer" underwent bilateral screening mammography with tomosynthesis at the breast center 11/02/2015 showing a possible mass in the right breast and low right axillary area. She was called back for right diagnostic mammography with tomosynthesis and right ultrasonography 11/14/2015. The breast density was category B. There was a lobulated mass in the lateral right breast, with a smaller oval mass immediately adjacent to it. Also in the upper outer quadrant there was a third mass. This has been stable back to 2014 and is felt to be a fibroadenoma.  Ultrasound of the right breast confirmed a hypoechoic mass at the 3:00 position 6 cm from the nipple, measuring 1.4 cm. Adjacent to this there was a 0.5 cm internal mammary lymph node, with normal morphology and normal hilar fat. The mass at the 10:00 position measured 0.8 cm and again was consistent with a fibroadenoma. In the right axilla there was an abnormal lymph node with no internal fat measuring 1.8 cm. The rest of the exam was unremarkable.  Biopsy of the right breast 8:30 o'clock lesion and the right axillary lymph node on 11/16/2015 showed ([id number]) both to be positive for invasive ductal carcinoma, grade 3, the  breast mass being estrogen receptor 10% positive with moderate staining intensity, but progesterone receptor negative, but the lymph node being estrogen and progesterone both negative. Both the breast and lymph nodes had MIB-1 of 30%. Both were positive for HER-2 amplification, the signals ratio is being 6.06 and 7.61, and the number per cell 10.00 and 11.80.  The patient's subsequent history is as detailed below   INTERVAL HISTORY: Terri Mayer returns for follow up of her HER-2 positive cancer, alone. Today is day 1, cycle 2 of 6 planned cycles of carboplatin, docetaxel, trastuzumab and pertuzumab.  REVIEW OF SYSTEMS: She is having cold symptoms, managed with advil sinus and congestion tablets. She is afebrile with clear drainage from her nose. She denies nausea or vomiting. Her bowels are loose but not watery. She has questran powder but is not using it. She denies mouth sores or rashes. She is "more aware" of the baseline numbness to her great toes, but it is no worse. Her PCP prescribed her fluconazole and nystatin cream for a vaginal yeast infection that left her outer labia sore and irritated. Her appetite is only fair secondary to taste changes. Her hair is thinning. A detailed review of systems is otherwise stable.   PAST MEDICAL HISTORY: Past Medical History  Diagnosis Date  . History of chicken pox   . Allergy   . Breast cancer ([place])   . Osteoarthritis     Neck,Back,Knees,Hips,Ankles    PAST SURGICAL HISTORY: Past Surgical History  Procedure Laterality Date  . Eye surgery  at age 81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  . Wisdom tooth extraction    . Dilation and curettage of uterus    . Portacath  placement Right 12/11/2015    Procedure: INSERTION PORT-A-CATH WITH ULTRASOUND;  Surgeon: Rolm Bookbinder, MD;  Location: [facility];  Service: General;  Laterality: Right;    FAMILY HISTORY Family History  Problem Relation Age of Onset  . Heart disease Father   . Diabetes      GM   . Colon cancer Neg Hx   . Breast cancer Neg Hx   . Dementia      M  . Transient ischemic attack      F   the patient's father died after multiple strokes at the age of 51. The patient's mother died at the age of 76 with Alzheimer's disease. The patient had one brother, one sister. The patient's paternal grandfather died from lung cancer in his 9s. On the mother's side there are 2 (second) cousins with breast cancer diagnosed in their early 20s, and a more remote cousin diagnosed with breast cancer at age 48. There is no history of ovarian cancer.   GYNECOLOGIC HISTORY:  No LMP recorded. Patient is postmenopausal.  menarche age 68, the patient is GX P0 and she understands that not carrying a child to term before the age of 61 essentially doubled the risk of breast cancer. She stopped having periods between age 10 and 51 and did not take hormone replacement. 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 She lives by herself, with her pet dog. Her son, adopted at birth, Terri Mayer, currently 20, attends [employer] in the psychology Department.     ADVANCED DIRECTIVES: Not in place. At the 01/08/2016 clinic visit the patient was given the appropriate forms to complete and notarize at her discretion.    HEALTH MAINTENANCE: Social History  Substance Use Topics  . Smoking status: Never Smoker   . Smokeless tobacco: Never Used  . Alcohol Use: Yes     Comment: rarely      Colonoscopy: 12/28/2004  PAP:  Bone density:  Lipid panel:  Allergies  Allergen Reactions  . Codeine Nausea And Vomiting    Current Outpatient Prescriptions  Medication Sig Dispense Refill  . dexamethasone (DECADRON) 4 MG tablet Take 2 tablets (8 mg total) by mouth 2 (two) times daily. Start the day before Taxotere. Then again the day after chemo for 3 days. 30 tablet 1  . fluconazole  (DIFLUCAN) 150 MG tablet Take 1 tablet (150 mg total) by mouth every 3 (three) days. 5 tablet 0  . lidocaine-prilocaine (EMLA) cream Apply to affected area once 30 g 3  . LORazepam (ATIVAN) 0.5 MG tablet Take 1 tablet (0.5 mg total) by mouth at bedtime as needed (Nausea or vomiting). 30 tablet 0  . nystatin-triamcinolone ointment (MYCOLOG) Apply 1 application topically 2 (two) times daily. 90 g 6  . cholestyramine (QUESTRAN) 4 g packet Take 4 g in slurry twice daily as needed for diarrhea (Patient not taking: Reported on 01/08/2016) 60 each 12  . clobetasol cream (TEMOVATE) 8.92 % Apply 1 application topically 2 (two) times daily. Reported on 01/08/2016  4  . IBUPROFEN PO Take by mouth as needed. Reported on 01/08/2016    . ondansetron (ZOFRAN) 8 MG tablet Take 1 tablet (8 mg total) by mouth 2 (two) times daily. Start the day after chemo for 3 days. Then take as needed for nausea or vomiting. (Patient not taking: Reported on  01/08/2016) 30 tablet 1  . prochlorperazine (COMPAZINE) 10 MG tablet Take 1 tablet (10 mg total) by mouth every 6 (six) hours as needed (Nausea or vomiting). (Patient not taking: Reported on 01/08/2016) 30 tablet 1   No current facility-administered medications for this visit.    OBJECTIVE: Middle-aged white woman who appears stated age  65 Vitals:   01/08/16 0859  BP: 122/60  Pulse: 90  Temp: 98.3 F (36.8 C)  Resp: 18     Body mass index is 39.06 kg/(m^2).    ECOG FS:1 - Symptomatic but completely ambulatory  Skin: warm, dry  HEENT: sclerae anicteric, conjunctivae pink, oropharynx clear. No thrush or mucositis.  Lymph Nodes: No cervical or supraclavicular lymphadenopathy  Lungs: clear to auscultation bilaterally, no rales, wheezes, or rhonci  Heart: regular rate and rhythm  Abdomen: round, soft, non tender, positive bowel sounds  Musculoskeletal: No focal spinal tenderness, no peripheral edema  Neuro: non focal, well oriented, positive affect  Breasts:  deferred  LAB RESULTS:  CMP     Component Value Date/Time   NA 142 01/08/2016 0827   K 4.3 01/08/2016 0827   CO2 24 01/08/2016 0827   GLUCOSE 129 01/08/2016 0827   BUN 19.3 01/08/2016 0827   CREATININE 0.7 01/08/2016 0827   CALCIUM 10.0 01/08/2016 0827   PROT 6.9 01/08/2016 0827   ALBUMIN 3.6 01/08/2016 0827   AST 18 01/08/2016 0827   ALT 24 01/08/2016 0827   ALKPHOS 80 01/08/2016 0827   BILITOT 0.67 01/08/2016 0827    INo results found for: SPEP, UPEP  Lab Results  Component Value Date   WBC 17.7* 01/08/2016   NEUTROABS 16.3* 01/08/2016   HGB 14.0 01/08/2016   HCT 43.1 01/08/2016   MCV 80.4 01/08/2016   PLT 316 01/08/2016      Chemistry      Component Value Date/Time   NA 142 01/08/2016 0827   K 4.3 01/08/2016 0827   CO2 24 01/08/2016 0827   BUN 19.3 01/08/2016 0827   CREATININE 0.7 01/08/2016 0827      Component Value Date/Time   CALCIUM 10.0 01/08/2016 0827   ALKPHOS 80 01/08/2016 0827   AST 18 01/08/2016 0827   ALT 24 01/08/2016 0827   BILITOT 0.67 01/08/2016 0827       No results found for: LABCA2  No components found for: LABCA125  No results for input(s): INR in the last 168 hours.  Urinalysis    Component Value Date/Time   BILIRUBINUR neg 12/29/2015 1014   PROTEINUR pos 12/29/2015 1014   UROBILINOGEN negative 12/29/2015 1014   NITRITE neg 12/29/2015 1014   LEUKOCYTESUR Negative 12/29/2015 1014    STUDIES: Dg Chest Port 1 View  12/11/2015  CLINICAL DATA:  Port-A-Cath placement, postop.  Breast cancer. EXAM: PORTABLE CHEST 1 VIEW COMPARISON:  None. FINDINGS: Right internal jugular Port-A-Cath has been placed with the tip in the upper right atrium. No pneumothorax. There is cardiomegaly. Patchy airspace disease bilaterally, most pronounced in the left upper lobe/ lingula and in the right perihilar/ infrahilar region. Suspect small left pleural effusion. No right effusion. No acute bony abnormality. IMPRESSION: Right Port-A-Cath tip in the  upper right atrium.  No pneumothorax. Cardiomegaly. Patchy bilateral airspace opacities, left greater than right. This could represent asymmetric edema or infection. Suspect small left effusion. Electronically Signed   By: Rolm Baptise M.D.   On: 12/11/2015 11:23   Dg Fluoro Guide Cv Line-no Report  12/11/2015  CLINICAL DATA:  FLOURO GUIDE CV LINE  Fluoroscopy was utilized by the requesting physician.  No radiographic interpretation.   ASSESSMENT: 65 y.o. [place] woman status post right breast lower outer quadrant and right axillary lymph node biopsy 11/16/2015, also positive for a clinical  T1c pN1, stage IIA invasive ductal carcinoma grade 3, essentially estrogen and progesterone receptor negative but HER-2 amplified, with an MIB-1 of 30%  (a) the breast lesion had moderate positivity for the estrogen receptor at 10%; the axillary lymph node lesion was estrogen receptor negative. Both were progesterone receptor negative  (1) neoadjuvant chemotherapy with carboplatin, docetaxel, trastuzumab and pertuzumab for 6 cycles, started 12/18/2015  (2) trastuzumab to be continued to total one year  (a) baseline echocardiogram on 12/12/15 showed an EF of 60-65%  (3) breast conserving surgery with consideration of the Alliance trial to follow  (4) adjuvant radiation as appropriate  PLAN: Terri Mayer is doing well today. With the adjustments made from cycle 1 in the last progress note, she should have a better experience this week. The labs were reviewed in detail and were stable. She will proceed with cycle 2 of carboplatin, docetaxel, trastuzumab, and pertuzumab as planned today.   I have refilled her fluconazole and nystatin cream.   Terri Mayer will return in 1 week for labs and a nadir visit. She understands and agrees with this plan. She knows the goal of treatment in her case is cure. She has been encouraged to call with any issues that might arise before her next visit here.   Laurie Panda, NP   01/08/2016  9:19 AM

## 2016-01-08 NOTE — Patient Instructions (Signed)
[facility] Discharge Instructions for Patients Receiving Chemotherapy  Today you received the following chemotherapy agents: herceptin, Perjeta, Taxotere, Carboplatin   To help prevent nausea and vomiting after your treatment, we encourage you to take your nausea medication as directed.    If you develop nausea and vomiting that is not controlled by your nausea medication, call the clinic.   BELOW ARE SYMPTOMS THAT SHOULD BE REPORTED IMMEDIATELY:  *FEVER GREATER THAN 100.5 F  *CHILLS WITH OR WITHOUT FEVER  NAUSEA AND VOMITING THAT IS NOT CONTROLLED WITH YOUR NAUSEA MEDICATION  *UNUSUAL SHORTNESS OF BREATH  *UNUSUAL BRUISING OR BLEEDING  TENDERNESS IN MOUTH AND THROAT WITH OR WITHOUT PRESENCE OF ULCERS  *URINARY PROBLEMS  *BOWEL PROBLEMS  UNUSUAL RASH Items with * indicate a potential emergency and should be followed up as soon as possible.  Feel free to call the clinic you have any questions or concerns. The clinic phone number is [phone].  Please show the CHEMO ALERT CARD at check-in to the Emergency Department and triage nurse.   

## 2016-01-10 ENCOUNTER — Ambulatory Visit: Payer: PPO

## 2016-01-12 ENCOUNTER — Other Ambulatory Visit: Payer: Self-pay | Admitting: *Deleted

## 2016-01-12 DIAGNOSIS — C50211 Malignant neoplasm of upper-inner quadrant of right female breast: Secondary | ICD-10-CM

## 2016-01-15 ENCOUNTER — Other Ambulatory Visit (HOSPITAL_BASED_OUTPATIENT_CLINIC_OR_DEPARTMENT_OTHER): Payer: PPO

## 2016-01-15 ENCOUNTER — Encounter: Payer: Self-pay | Admitting: Nurse Practitioner

## 2016-01-15 ENCOUNTER — Telehealth: Payer: Self-pay | Admitting: Oncology

## 2016-01-15 ENCOUNTER — Ambulatory Visit (HOSPITAL_BASED_OUTPATIENT_CLINIC_OR_DEPARTMENT_OTHER): Payer: PPO | Admitting: Nurse Practitioner

## 2016-01-15 ENCOUNTER — Other Ambulatory Visit: Payer: Self-pay | Admitting: *Deleted

## 2016-01-15 VITALS — BP 122/67 | HR 105 | Temp 98.7°F | Resp 18 | Ht 66.0 in | Wt 230.4 lb

## 2016-01-15 DIAGNOSIS — G629 Polyneuropathy, unspecified: Secondary | ICD-10-CM

## 2016-01-15 DIAGNOSIS — C50211 Malignant neoplasm of upper-inner quadrant of right female breast: Secondary | ICD-10-CM | POA: Diagnosis not present

## 2016-01-15 LAB — COMPREHENSIVE METABOLIC PANEL
ALT: 72 U/L — ABNORMAL HIGH (ref 0–55)
ANION GAP: 10 meq/L (ref 3–11)
AST: 27 U/L (ref 5–34)
Albumin: 3.6 g/dL (ref 3.5–5.0)
Alkaline Phosphatase: 93 U/L (ref 40–150)
BUN: 14.1 mg/dL (ref 7.0–26.0)
CHLORIDE: 101 meq/L (ref 98–109)
CO2: 28 meq/L (ref 22–29)
Calcium: 9.3 mg/dL (ref 8.4–10.4)
Creatinine: 1 mg/dL (ref 0.6–1.1)
EGFR: 60 mL/min/{1.73_m2} — AB (ref >90)
GLUCOSE: 107 mg/dL (ref 70–140)
POTASSIUM: 4.3 meq/L (ref 3.5–5.1)
SODIUM: 139 meq/L (ref 136–145)
Total Bilirubin: 1.45 mg/dL — ABNORMAL HIGH (ref 0.20–1.20)
Total Protein: 6.6 g/dL (ref 6.4–8.3)

## 2016-01-15 LAB — CBC WITH DIFFERENTIAL/PLATELET
BASO%: 0.4 % (ref 0.0–2.0)
BASOS ABS: 0 10*3/uL (ref 0.0–0.1)
EOS ABS: 0.1 10*3/uL (ref 0.0–0.5)
EOS%: 0.5 % (ref 0.0–7.0)
HCT: 44.2 % (ref 34.8–46.6)
HGB: 14.3 g/dL (ref 11.6–15.9)
LYMPH%: 15.5 % (ref 14.0–49.7)
MCH: 26 pg (ref 25.1–34.0)
MCHC: 32.4 g/dL (ref 31.5–36.0)
MCV: 80.2 fL (ref 79.5–101.0)
MONO#: 2 10*3/uL — ABNORMAL HIGH (ref 0.1–0.9)
MONO%: 16.8 % — AB (ref 0.0–14.0)
NEUT%: 66.8 % (ref 38.4–76.8)
NEUTROS ABS: 8 10*3/uL — AB (ref 1.5–6.5)
Platelets: 203 10*3/uL (ref 145–400)
RBC: 5.51 10*6/uL — AB (ref 3.70–5.45)
RDW: 14.3 % (ref 11.2–14.5)
WBC: 12 10*3/uL — AB (ref 3.9–10.3)
lymph#: 1.9 10*3/uL (ref 0.9–3.3)

## 2016-01-15 MED ORDER — DEXAMETHASONE 4 MG PO TABS: 8.0000 mg | ORAL_TABLET | Freq: Two times a day (BID) | ORAL | Status: DC

## 2016-01-15 MED ORDER — LORAZEPAM 0.5 MG PO TABS: 0.5000 mg | ORAL_TABLET | Freq: Every evening | ORAL | Status: DC | PRN

## 2016-01-15 MED FILL — FLUCONAZOLE 150 MG TABLET: 150 | 15 days supply | Qty: 5 | Fill #0

## 2016-01-15 MED FILL — DEXAMETHASONE 4 MG TABLET: 4 | 15 days supply | Qty: 60 | Fill #0

## 2016-01-15 MED FILL — LORazepam 0.5 MG TABS: 0.5 | 30 days supply | Qty: 30 | Fill #0

## 2016-01-15 NOTE — Telephone Encounter (Signed)
Gave patient avs report and appointments for March thru May. °

## 2016-01-15 NOTE — Progress Notes (Signed)
[place]  Telephone:(336) [phone] Fax:(336) [phone]   ID: AIME CARRERAS DOB: [birth year]  MR#: [id number]  SNK#:[id number]  Patient Care Team: [name], MD as PCP - General (Internal Medicine) Chauncey Cruel, MD as Consulting Physician (Oncology) Rolm Bookbinder, MD as Consulting Physician (General Surgery) Thea Silversmith, MD as Consulting Physician (Radiation Oncology) Princess Bruins, MD as Consulting Physician (Obstetrics and Gynecology) Jolaine Artist, MD as Consulting Physician (Cardiology) PCP: [name], MD GYN: SU:  OTHER MD:  CHIEF COMPLAINT: right breast cancer  CURRENT TREATMENT: neoadjuvant chemotherapy  BREAST CANCER HISTORY: From the original intake note:  "Terri Mayer" underwent bilateral screening mammography with tomosynthesis at the breast center 11/02/2015 showing a possible mass in the right breast and low right axillary area. She was called back for right diagnostic mammography with tomosynthesis and right ultrasonography 11/14/2015. The breast density was category B. There was a lobulated mass in the lateral right breast, with a smaller oval mass immediately adjacent to it. Also in the upper outer quadrant there was a third mass. This has been stable back to 2014 and is felt to be a fibroadenoma.  Ultrasound of the right breast confirmed a hypoechoic mass at the 3:00 position 6 cm from the nipple, measuring 1.4 cm. Adjacent to this there was a 0.5 cm internal mammary lymph node, with normal morphology and normal hilar fat. The mass at the 10:00 position measured 0.8 cm and again was consistent with a fibroadenoma. In the right axilla there was an abnormal lymph node with no internal fat measuring 1.8 cm. The rest of the exam was unremarkable.  Biopsy of the right breast 8:30 o'clock lesion and the right axillary lymph node on 11/16/2015 showed ([id number]) both to be positive for invasive ductal carcinoma, grade 3, the  breast mass being estrogen receptor 10% positive with moderate staining intensity, but progesterone receptor negative, but the lymph node being estrogen and progesterone both negative. Both the breast and lymph nodes had MIB-1 of 30%. Both were positive for HER-2 amplification, the signals ratio is being 6.06 and 7.61, and the number per cell 10.00 and 11.80.  The patient's subsequent history is as detailed below   INTERVAL HISTORY: Terri Mayer returns for follow up of her HER-2 positive cancer, alone. Today is day 8, cycle 2 of 6 planned cycles of carboplatin, docetaxel, trastuzumab and pertuzumab.  REVIEW OF SYSTEMS: Terri Mayer denies fevers or chills. Her sinus symptoms have resolved Her nausea is managed well with PRN antiemetics. She developed constipation this round, and was forced to take colace PRN. She is not having any more vaginal yeast symptoms or irritation. Her appetite is fair secondary to taste changes. She denies mouth sores. She believes the baseline numbness to her great toes is worsening. They were painful but now are just numb. She has buzzer her hair off. She has greater fatigue this week. She is using ativan PRN for sleep. A detailed review of systems is otherwise stable.  PAST MEDICAL HISTORY: Past Medical History  Diagnosis Date  . History of chicken pox   . Allergy   . Breast cancer ([place])   . Osteoarthritis     Neck,Back,Knees,Hips,Ankles    PAST SURGICAL HISTORY: Past Surgical History  Procedure Laterality Date  . Eye surgery  at age 24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  . Wisdom tooth extraction    . Dilation and curettage of uterus    . Portacath placement Right 12/11/2015    Procedure: INSERTION PORT-A-CATH  WITH ULTRASOUND;  Surgeon: Rolm Bookbinder, MD;  Location: [place];  Service: General;  Laterality: Right;    FAMILY HISTORY Family History  Problem Relation Age of Onset  . Heart disease Father   . Diabetes      GM  . Colon cancer Neg Hx   . Breast  cancer Neg Hx   . Dementia      M  . Transient ischemic attack      F   the patient's father died after multiple strokes at the age of 44. The patient's mother died at the age of 76 with Alzheimer's disease. The patient had one brother, one sister. The patient's paternal grandfather died from lung cancer in his 65s. On the mother's side there are 2 (second) cousins with breast cancer diagnosed in their early 73s, and a more remote cousin diagnosed with breast cancer at age 37. There is no history of ovarian cancer.   GYNECOLOGIC HISTORY:  No LMP recorded. Patient is postmenopausal.  menarche age 65, the patient is GX P0 and she understands that not carrying a child to term before the age of 24 essentially doubled the risk of breast cancer. She stopped having periods between age 89 and 24 and did not take hormone replacement. 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 She lives by herself, with her pet dog. Her son, adopted at birth, Anuhea Gassner, currently 20, attends [employer] in the psychology Department.     ADVANCED DIRECTIVES: Not in place. At the 01/15/2016 clinic visit the patient was given the appropriate forms to complete and notarize at her discretion.    HEALTH MAINTENANCE: Social History  Substance Use Topics  . Smoking status: Never Smoker   . Smokeless tobacco: Never Used  . Alcohol Use: Yes     Comment: rarely      Colonoscopy: 12/28/2004  PAP:  Bone density:  Lipid panel:  Allergies  Allergen Reactions  . Codeine Nausea And Vomiting    Current Outpatient Prescriptions  Medication Sig Dispense Refill  . fluconazole (DIFLUCAN) 150 MG tablet Take 1 tablet (150 mg total) by mouth every 3 (three) days. 5 tablet 1  . lidocaine-prilocaine (EMLA) cream Apply to affected area once 30 g 3  . cholestyramine (QUESTRAN) 4 g packet Take 4 g in  slurry twice daily as needed for diarrhea (Patient not taking: Reported on 01/08/2016) 60 each 12  . clobetasol cream (TEMOVATE) 7.02 % Apply 1 application topically 2 (two) times daily. Reported on 01/15/2016  4  . dexamethasone (DECADRON) 4 MG tablet Take 2 tablets (8 mg total) by mouth 2 (two) times daily. Start the day before Taxotere. Then again the day after chemo for 3 days. 60 tablet 0  . IBUPROFEN PO Take by mouth as needed. Reported on 01/15/2016    . LORazepam (ATIVAN) 0.5 MG tablet Take 1 tablet (0.5 mg total) by mouth at bedtime as needed (Nausea or vomiting). 30 tablet 0  . nystatin-triamcinolone ointment (MYCOLOG) Apply 1 application topically 2 (two) times daily. (Patient not taking: Reported on 01/15/2016) 90 g 6  . ondansetron (ZOFRAN) 8 MG tablet Take 1 tablet (8 mg total) by mouth 2 (two) times daily. Start the day after chemo for 3 days. Then take as needed for nausea or vomiting. (Patient not taking: Reported on 01/08/2016) 30 tablet  1  . prochlorperazine (COMPAZINE) 10 MG tablet Take 1 tablet (10 mg total) by mouth every 6 (six) hours as needed (Nausea or vomiting). (Patient not taking: Reported on 01/08/2016) 30 tablet 1   No current facility-administered medications for this visit.    OBJECTIVE: Middle-aged white woman who appears stated age  65 Vitals:   01/15/16 1454  BP: 122/67  Pulse: 105  Temp: 98.7 F (37.1 C)  Resp: 18     Body mass index is 37.21 kg/(m^2).    ECOG FS:1 - Symptomatic but completely ambulatory  Sclerae unicteric, pupils round and equal Oropharynx clear and moist-- no thrush or other lesions No cervical or supraclavicular adenopathy Lungs no rales or rhonchi Heart regular rate and rhythm Abd soft, nontender, positive bowel sounds MSK no focal spinal tenderness, no upper extremity lymphedema Neuro: nonfocal, well oriented, appropriate affect Breasts: deferred  LAB RESULTS:  CMP     Component Value Date/Time   NA 139 01/15/2016 1415   K  4.3 01/15/2016 1415   CO2 28 01/15/2016 1415   GLUCOSE 107 01/15/2016 1415   BUN 14.1 01/15/2016 1415   CREATININE 1.0 01/15/2016 1415   CALCIUM 9.3 01/15/2016 1415   PROT 6.6 01/15/2016 1415   ALBUMIN 3.6 01/15/2016 1415   AST 27 01/15/2016 1415   ALT 72* 01/15/2016 1415   ALKPHOS 93 01/15/2016 1415   BILITOT 1.45* 01/15/2016 1415    INo results found for: SPEP, UPEP  Lab Results  Component Value Date   WBC 12.0* 01/15/2016   NEUTROABS 8.0* 01/15/2016   HGB 14.3 01/15/2016   HCT 44.2 01/15/2016   MCV 80.2 01/15/2016   PLT 203 01/15/2016      Chemistry      Component Value Date/Time   NA 139 01/15/2016 1415   K 4.3 01/15/2016 1415   CO2 28 01/15/2016 1415   BUN 14.1 01/15/2016 1415   CREATININE 1.0 01/15/2016 1415      Component Value Date/Time   CALCIUM 9.3 01/15/2016 1415   ALKPHOS 93 01/15/2016 1415   AST 27 01/15/2016 1415   ALT 72* 01/15/2016 1415   BILITOT 1.45* 01/15/2016 1415       No results found for: LABCA2  No components found for: LABCA125  No results for input(s): INR in the last 168 hours.  Urinalysis    Component Value Date/Time   BILIRUBINUR neg 12/29/2015 1014   PROTEINUR pos 12/29/2015 1014   UROBILINOGEN negative 12/29/2015 1014   NITRITE neg 12/29/2015 1014   LEUKOCYTESUR Negative 12/29/2015 1014   STUDIES: No results found.   ASSESSMENT: 65 y.o. [place] woman status post right breast lower outer quadrant and right axillary lymph node biopsy 11/16/2015, also positive for a clinical  T1c pN1, stage IIA invasive ductal carcinoma grade 3, essentially estrogen and progesterone receptor negative but HER-2 amplified, with an MIB-1 of 30%  (a) the breast lesion had moderate positivity for the estrogen receptor at 10%; the axillary lymph node lesion was estrogen receptor negative. Both were progesterone receptor negative  (1) neoadjuvant chemotherapy with carboplatin, docetaxel, trastuzumab and pertuzumab for 6 cycles, started  12/18/2015  (2) trastuzumab to be continued to total one year  (a) baseline echocardiogram on 12/12/15 showed an EF of 60-65%  (3) breast conserving surgery with consideration of the Alliance trial to follow  (4) adjuvant radiation as appropriate  PLAN: Terri Mayer and I discussed her neuropathy in detail. She has stated in different ways how her neuropathy is to some extent worse. In  this case we would generally have the docetaxel switched out for gemcitabine where this is not a side effect. She is adamant that we try another cycle of treatment as is written with no changes, despite my explanations that gemcitabine is not a "lessor" drug, simply the second option for people who are sensitive to taxanes. She understands that continuing to treat under persistent neuropathy can lead to permanent damage. This is hard to assess in her case with baseline symptoms.   Terri Mayer will return in 2 week for cycle 3 of treatment. She understands and agrees with this plan. She knows the goal of treatment in her case is cure. She has been encouraged to call with any issues that might arise before her next visit here.   Laurie Panda, NP   01/15/2016 4:05 PM

## 2016-01-16 NOTE — Addendum Note (Signed)
Addended by: Marcelino Duster on: 01/16/2016 09:04 AM   Modules accepted: Orders

## 2016-01-17 ENCOUNTER — Other Ambulatory Visit: Payer: Self-pay | Admitting: Nurse Practitioner

## 2016-01-17 ENCOUNTER — Telehealth: Payer: Self-pay

## 2016-01-17 NOTE — Telephone Encounter (Signed)
Patient called questioning what she read from [facility] clinic note from 01/15/16.  Writer called to clarify and LVM with answer.  Patient encouraged to call back if she continues to have questions.

## 2016-01-29 ENCOUNTER — Encounter: Payer: Self-pay | Admitting: *Deleted

## 2016-01-29 ENCOUNTER — Other Ambulatory Visit (HOSPITAL_BASED_OUTPATIENT_CLINIC_OR_DEPARTMENT_OTHER): Payer: PPO

## 2016-01-29 ENCOUNTER — Ambulatory Visit (HOSPITAL_BASED_OUTPATIENT_CLINIC_OR_DEPARTMENT_OTHER): Payer: PPO

## 2016-01-29 ENCOUNTER — Encounter: Payer: Self-pay | Admitting: Nurse Practitioner

## 2016-01-29 ENCOUNTER — Ambulatory Visit (HOSPITAL_BASED_OUTPATIENT_CLINIC_OR_DEPARTMENT_OTHER): Payer: PPO | Admitting: Nurse Practitioner

## 2016-01-29 VITALS — BP 101/84 | HR 73 | Temp 98.1°F | Resp 18 | Ht 66.0 in | Wt 235.7 lb

## 2016-01-29 DIAGNOSIS — C50211 Malignant neoplasm of upper-inner quadrant of right female breast: Secondary | ICD-10-CM

## 2016-01-29 DIAGNOSIS — G622 Polyneuropathy due to other toxic agents: Secondary | ICD-10-CM | POA: Diagnosis not present

## 2016-01-29 DIAGNOSIS — Z5111 Encounter for antineoplastic chemotherapy: Secondary | ICD-10-CM

## 2016-01-29 DIAGNOSIS — G620 Drug-induced polyneuropathy: Secondary | ICD-10-CM

## 2016-01-29 DIAGNOSIS — Z5112 Encounter for antineoplastic immunotherapy: Secondary | ICD-10-CM | POA: Diagnosis not present

## 2016-01-29 DIAGNOSIS — T451X5A Adverse effect of antineoplastic and immunosuppressive drugs, initial encounter: Secondary | ICD-10-CM

## 2016-01-29 LAB — CBC WITH DIFFERENTIAL/PLATELET
BASO%: 0.3 % (ref 0.0–2.0)
BASOS ABS: 0 10*3/uL (ref 0.0–0.1)
EOS ABS: 0 10*3/uL (ref 0.0–0.5)
EOS%: 0 % (ref 0.0–7.0)
HEMATOCRIT: 40.3 % (ref 34.8–46.6)
HEMOGLOBIN: 13.2 g/dL (ref 11.6–15.9)
LYMPH#: 0.7 10*3/uL — AB (ref 0.9–3.3)
LYMPH%: 7.6 % — ABNORMAL LOW (ref 14.0–49.7)
MCH: 26.8 pg (ref 25.1–34.0)
MCHC: 32.7 g/dL (ref 31.5–36.0)
MCV: 82.1 fL (ref 79.5–101.0)
MONO#: 0 10*3/uL — AB (ref 0.1–0.9)
MONO%: 0.5 % (ref 0.0–14.0)
NEUT#: 8.3 10*3/uL — ABNORMAL HIGH (ref 1.5–6.5)
NEUT%: 91.6 % — AB (ref 38.4–76.8)
PLATELETS: 298 10*3/uL (ref 145–400)
RBC: 4.91 10*6/uL (ref 3.70–5.45)
RDW: 15.7 % — AB (ref 11.2–14.5)
WBC: 9 10*3/uL (ref 3.9–10.3)

## 2016-01-29 LAB — COMPREHENSIVE METABOLIC PANEL
ALT: 29 U/L (ref 0–55)
ANION GAP: 8 meq/L (ref 3–11)
AST: 19 U/L (ref 5–34)
Albumin: 3.5 g/dL (ref 3.5–5.0)
Alkaline Phosphatase: 76 U/L (ref 40–150)
BUN: 20.3 mg/dL (ref 7.0–26.0)
CHLORIDE: 105 meq/L (ref 98–109)
CO2: 25 meq/L (ref 22–29)
Calcium: 9.4 mg/dL (ref 8.4–10.4)
Creatinine: 0.8 mg/dL (ref 0.6–1.1)
EGFR: 77 mL/min/{1.73_m2} — AB (ref >90)
GLUCOSE: 153 mg/dL — AB (ref 70–140)
Potassium: 4.2 mEq/L (ref 3.5–5.1)
SODIUM: 139 meq/L (ref 136–145)
TOTAL PROTEIN: 6.7 g/dL (ref 6.4–8.3)
Total Bilirubin: 0.79 mg/dL (ref 0.20–1.20)

## 2016-01-29 MED ORDER — HEPARIN SOD (PORK) LOCK FLUSH 100 UNIT/ML IV SOLN
500.0000 [IU] | Freq: Once | INTRAVENOUS | Status: AC | PRN
  Administered 2016-01-29: 500 [IU]
  Filled 2016-01-29: qty 5

## 2016-01-29 MED ORDER — SODIUM CHLORIDE 0.9 % IV SOLN
420.0000 mg | Freq: Once | INTRAVENOUS | Status: AC
  Administered 2016-01-29: 420 mg via INTRAVENOUS
  Filled 2016-01-29: qty 14

## 2016-01-29 MED ORDER — PEGFILGRASTIM 6 MG/0.6ML ~~LOC~~ PSKT
6.0000 mg | PREFILLED_SYRINGE | Freq: Once | SUBCUTANEOUS | Status: AC
  Administered 2016-01-29: 6 mg via SUBCUTANEOUS
  Filled 2016-01-29: qty 0.6

## 2016-01-29 MED ORDER — SODIUM CHLORIDE 0.9 % IV SOLN
Freq: Once | INTRAVENOUS | Status: AC
  Administered 2016-01-29: 10:00:00 via INTRAVENOUS

## 2016-01-29 MED ORDER — TRASTUZUMAB CHEMO INJECTION 440 MG
6.0000 mg/kg | Freq: Once | INTRAVENOUS | Status: AC
  Administered 2016-01-29: 693 mg via INTRAVENOUS
  Filled 2016-01-29: qty 33

## 2016-01-29 MED ORDER — ACETAMINOPHEN 325 MG PO TABS
ORAL_TABLET | ORAL | Status: AC
  Filled 2016-01-29: qty 2

## 2016-01-29 MED ORDER — ACETAMINOPHEN 325 MG PO TABS
650.0000 mg | ORAL_TABLET | Freq: Once | ORAL | Status: AC
  Administered 2016-01-29: 650 mg via ORAL

## 2016-01-29 MED ORDER — DIPHENHYDRAMINE HCL 25 MG PO CAPS
50.0000 mg | ORAL_CAPSULE | Freq: Once | ORAL | Status: AC
  Administered 2016-01-29: 50 mg via ORAL

## 2016-01-29 MED ORDER — ONDANSETRON HCL 8 MG PO TABS: 8.0000 mg | ORAL_TABLET | Freq: Two times a day (BID) | ORAL | Status: DC

## 2016-01-29 MED ORDER — PROCHLORPERAZINE MALEATE 10 MG PO TABS: 10.0000 mg | ORAL_TABLET | Freq: Four times a day (QID) | ORAL | Status: DC | PRN

## 2016-01-29 MED ORDER — SODIUM CHLORIDE 0.9 % IJ SOLN
10.0000 mL | INTRAMUSCULAR | Status: DC | PRN
Start: 2016-01-29 — End: 2016-01-29
  Administered 2016-01-29: 10 mL
  Filled 2016-01-29: qty 10

## 2016-01-29 MED ORDER — SODIUM CHLORIDE 0.9 % IV SOLN
Freq: Once | INTRAVENOUS | Status: AC
  Administered 2016-01-29: 10:00:00 via INTRAVENOUS
  Filled 2016-01-29: qty 8

## 2016-01-29 MED ORDER — SODIUM CHLORIDE 0.9 % IV SOLN
633.0000 mg | Freq: Once | INTRAVENOUS | Status: AC
  Administered 2016-01-29: 630 mg via INTRAVENOUS
  Filled 2016-01-29: qty 63

## 2016-01-29 MED ORDER — DOCETAXEL CHEMO INJECTION 160 MG/16ML
75.0000 mg/m2 | Freq: Once | INTRAVENOUS | Status: AC
  Administered 2016-01-29: 170 mg via INTRAVENOUS
  Filled 2016-01-29: qty 17

## 2016-01-29 MED ORDER — DIPHENHYDRAMINE HCL 25 MG PO CAPS
ORAL_CAPSULE | ORAL | Status: AC
  Filled 2016-01-29: qty 2

## 2016-01-29 MED FILL — ONDANSETRON HCL 8 MG TABLET: 8 | 15 days supply | Qty: 30 | Fill #0

## 2016-01-29 MED FILL — PROCHLORPERAZINE 10 MG TAB: 10 | 7 days supply | Qty: 30 | Fill #0

## 2016-01-29 NOTE — Patient Instructions (Signed)
[facility] Discharge Instructions for Patients Receiving Chemotherapy  Today you received the following chemotherapy agents :  Herceptin, Perjeta, Taxotere, Carboplatin.  To help prevent nausea and vomiting after your treatment, we encourage you to take your nausea medication as prescribed.   If you develop nausea and vomiting that is not controlled by your nausea medication, call the clinic.   BELOW ARE SYMPTOMS THAT SHOULD BE REPORTED IMMEDIATELY:  *FEVER GREATER THAN 100.5 F  *CHILLS WITH OR WITHOUT FEVER  NAUSEA AND VOMITING THAT IS NOT CONTROLLED WITH YOUR NAUSEA MEDICATION  *UNUSUAL SHORTNESS OF BREATH  *UNUSUAL BRUISING OR BLEEDING  TENDERNESS IN MOUTH AND THROAT WITH OR WITHOUT PRESENCE OF ULCERS  *URINARY PROBLEMS  *BOWEL PROBLEMS  UNUSUAL RASH Items with * indicate a potential emergency and should be followed up as soon as possible.  Feel free to call the clinic you have any questions or concerns. The clinic phone number is [phone].  Please show the CHEMO ALERT CARD at check-in to the Emergency Department and triage nurse.   

## 2016-01-29 NOTE — Progress Notes (Signed)
[place]  [phone] [phone]) [phone]   ID: Terri Mayer DOB: 05/17/1951  MR#: [id number]  SEG#:[id number]  Patient Care Team: [name], MD as PCP - General (Internal Medicine) Chauncey Cruel, MD as Consulting Physician (Oncology) Rolm Bookbinder, MD as Consulting Physician (General Surgery) Thea Silversmith, MD as Consulting Physician (Radiation Oncology) Princess Bruins, MD as Consulting Physician (Obstetrics and Gynecology) Jolaine Artist, MD as Consulting Physician (Cardiology) PCP: [name], MD GYN: SU:  OTHER MD:  CHIEF COMPLAINT: right breast cancer  CURRENT TREATMENT: neoadjuvant chemotherapy  BREAST CANCER HISTORY: From the original intake note:  "Terri Mayer" underwent bilateral screening mammography with tomosynthesis at the breast center 11/02/2015 showing a possible mass in the right breast and low right axillary area. She was called back for right diagnostic mammography with tomosynthesis and right ultrasonography 11/14/2015. The breast density was category B. There was a lobulated mass in the lateral right breast, with a smaller oval mass immediately adjacent to it. Also in the upper outer quadrant there was a third mass. This has been stable back to 2014 and is felt to be a fibroadenoma.  Ultrasound of the right breast confirmed a hypoechoic mass at the 3:00 position 6 cm from the nipple, measuring 1.4 cm. Adjacent to this there was a 0.5 cm internal mammary lymph node, with normal morphology and normal hilar fat. The mass at the 10:00 position measured 0.8 cm and again was consistent with a fibroadenoma. In the right axilla there was an abnormal lymph node with no internal fat measuring 1.8 cm. The rest of the exam was unremarkable.  Biopsy of the right breast 8:30 o'clock lesion and the right axillary lymph node on 11/16/2015 showed ([id number]) both to be positive for invasive ductal carcinoma, grade 3, the  breast mass being estrogen receptor 10% positive with moderate staining intensity, but progesterone receptor negative, but the lymph node being estrogen and progesterone both negative. Both the breast and lymph nodes had MIB-1 of 30%. Both were positive for HER-2 amplification, the signals ratio is being 6.06 and 7.61, and the number per cell 10.00 and 11.80.  The patient's subsequent history is as detailed below   INTERVAL HISTORY: Terri Mayer returns for follow up of her HER-2 positive cancer, alone. Today is day 1, cycle 3 of 6 planned cycles of carboplatin, docetaxel, trastuzumab and pertuzumab.  REVIEW OF SYSTEMS: Terri Mayer has had excellent energy for the past 10 days. She has been able to work out at [employer] and has good endurance. She denies fevers or chills. Her nausea is managed well with PRN antiemetics. She is having loose stools but they are not very frequent. She has been avoiding use of antidiarrhea agents for fear of constipation. Her appetite is fair. She denies mouth sores. She has an asymptomatic rash to her right cheek that appeared 2 days ago. She continue to have numbness and tingling to her fingertips and toes, but she states this is no worse than last week. She continues to use ativan PRN for sleep. A detailed review of systems is otherwise stable.  PAST MEDICAL HISTORY: Past Medical History  Diagnosis Date  . History of chicken pox   . Allergy   . Breast cancer ([place])   . Osteoarthritis     Neck,Back,Knees,Hips,Ankles    PAST SURGICAL HISTORY: Past Surgical History  Procedure Laterality Date  . Eye surgery  at age 69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  . Wisdom tooth extraction    .  Dilation and curettage of uterus    . Portacath placement Right 12/11/2015    Procedure: INSERTION PORT-A-CATH WITH ULTRASOUND;  Surgeon: Rolm Bookbinder, MD;  Location: [facility];  Service: General;  Laterality: Right;    FAMILY HISTORY Family History  Problem Relation Age of Onset  .  Heart disease Father   . Diabetes      GM  . Colon cancer Neg Hx   . Breast cancer Neg Hx   . Dementia      M  . Transient ischemic attack      F   the patient's father died after multiple strokes at the age of 5. The patient's mother died at the age of 89 with Alzheimer's disease. The patient had one brother, one sister. The patient's paternal grandfather died from lung cancer in his 42s. On the mother's side there are 2 (second) cousins with breast cancer diagnosed in their early 81s, and a more remote cousin diagnosed with breast cancer at age 14. There is no history of ovarian cancer.   GYNECOLOGIC HISTORY:  No LMP recorded. Patient is postmenopausal.  menarche age 28, the patient is GX P0 and she understands that not carrying a child to term before the age of 6 essentially doubled the risk of breast cancer. She stopped having periods between age 21 and 22 and did not take hormone replacement. 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 She lives by herself, with her pet dog. Her son, adopted at birth, Terri Mayer, currently 20, attends [employer] in the psychology Department.     ADVANCED DIRECTIVES: Not in place. At the 01/29/2016 clinic visit the patient was given the appropriate forms to complete and notarize at her discretion.    HEALTH MAINTENANCE: Social History  Substance Use Topics  . Smoking status: Never Smoker   . Smokeless tobacco: Never Used  . Alcohol Use: Yes     Comment: rarely      Colonoscopy: 12/28/2004  PAP:  Bone density:  Lipid panel:  Allergies  Allergen Reactions  . Codeine Nausea And Vomiting    Current Outpatient Prescriptions  Medication Sig Dispense Refill  . dexamethasone (DECADRON) 4 MG tablet Take 2 tablets (8 mg total) by mouth 2 (two) times daily. Start the day before Taxotere. Then again the day after  chemo for 3 days. 60 tablet 0  . lidocaine-prilocaine (EMLA) cream Apply to affected area once 30 g 3  . LORazepam (ATIVAN) 0.5 MG tablet Take 1 tablet (0.5 mg total) by mouth at bedtime as needed (Nausea or vomiting). 30 tablet 0  . cholestyramine (QUESTRAN) 4 g packet Take 4 g in slurry twice daily as needed for diarrhea (Patient not taking: Reported on 01/08/2016) 60 each 12  . clobetasol cream (TEMOVATE) 6.33 % Apply 1 application topically 2 (two) times daily. Reported on 01/29/2016  4  . IBUPROFEN PO Take by mouth as needed. Reported on 01/29/2016    . nystatin-triamcinolone ointment (MYCOLOG) Apply 1 application topically 2 (two) times daily. (Patient not taking: Reported on 01/15/2016) 90 g 6  . ondansetron (ZOFRAN) 8 MG tablet Take 1 tablet (8 mg total) by mouth 2 (two) times daily. Start the day after chemo for 3 days. Then take as needed for nausea or vomiting. (Patient not taking: Reported on 01/08/2016) 30 tablet 1  .  prochlorperazine (COMPAZINE) 10 MG tablet Take 1 tablet (10 mg total) by mouth every 6 (six) hours as needed (Nausea or vomiting). (Patient not taking: Reported on 01/08/2016) 30 tablet 1   No current facility-administered medications for this visit.    OBJECTIVE: Middle-aged white woman who appears stated age  65 Vitals:   01/29/16 0837  BP: 101/84  Pulse: 73  Temp: 98.1 F (36.7 C)  Resp: 18     Body mass index is 38.06 kg/(m^2).    ECOG FS:1 - Symptomatic but completely ambulatory  Skin: warm, dry, macular erythematous rash to right cheek HEENT: sclerae anicteric, conjunctivae pink, oropharynx clear. No thrush or mucositis.  Lymph Nodes: No cervical or supraclavicular lymphadenopathy  Lungs: clear to auscultation bilaterally, no rales, wheezes, or rhonci  Heart: regular rate and rhythm  Abdomen: round, soft, non tender, positive bowel sounds  Musculoskeletal: No focal spinal tenderness, no peripheral edema  Neuro: non focal, well oriented, positive affect    Breasts: deferred  LAB RESULTS:  CMP     Component Value Date/Time   NA 139 01/29/2016 0746   K 4.2 01/29/2016 0746   CO2 25 01/29/2016 0746   GLUCOSE 153* 01/29/2016 0746   BUN 20.3 01/29/2016 0746   CREATININE 0.8 01/29/2016 0746   CALCIUM 9.4 01/29/2016 0746   PROT 6.7 01/29/2016 0746   ALBUMIN 3.5 01/29/2016 0746   AST 19 01/29/2016 0746   ALT 29 01/29/2016 0746   ALKPHOS 76 01/29/2016 0746   BILITOT 0.79 01/29/2016 0746    INo results found for: SPEP, UPEP  Lab Results  Component Value Date   WBC 9.0 01/29/2016   NEUTROABS 8.3* 01/29/2016   HGB 13.2 01/29/2016   HCT 40.3 01/29/2016   MCV 82.1 01/29/2016   PLT 298 01/29/2016      Chemistry      Component Value Date/Time   NA 139 01/29/2016 0746   K 4.2 01/29/2016 0746   CO2 25 01/29/2016 0746   BUN 20.3 01/29/2016 0746   CREATININE 0.8 01/29/2016 0746      Component Value Date/Time   CALCIUM 9.4 01/29/2016 0746   ALKPHOS 76 01/29/2016 0746   AST 19 01/29/2016 0746   ALT 29 01/29/2016 0746   BILITOT 0.79 01/29/2016 0746       No results found for: LABCA2  No components found for: LABCA125  No results for input(s): INR in the last 168 hours.  Urinalysis    Component Value Date/Time   BILIRUBINUR neg 12/29/2015 1014   PROTEINUR pos 12/29/2015 1014   UROBILINOGEN negative 12/29/2015 1014   NITRITE neg 12/29/2015 1014   LEUKOCYTESUR Negative 12/29/2015 1014   STUDIES: No results found.   ASSESSMENT: 65 y.o. [place] woman status post right breast lower outer quadrant and right axillary lymph node biopsy 11/16/2015, also positive for a clinical  T1c pN1, stage IIA invasive ductal carcinoma grade 3, essentially estrogen and progesterone receptor negative but HER-2 amplified, with an MIB-1 of 30%  (a) the breast lesion had moderate positivity for the estrogen receptor at 10%; the axillary lymph node lesion was estrogen receptor negative. Both were progesterone receptor negative  (1)  neoadjuvant chemotherapy with carboplatin, docetaxel, trastuzumab and pertuzumab for 6 cycles, started 12/18/2015  (2) trastuzumab to be continued to total one year  (a) baseline echocardiogram on 12/12/15 showed an EF of 60-65%  (3) breast conserving surgery with consideration of the Alliance trial to follow  (4) adjuvant radiation as appropriate  PLAN: Terri Mayer is stable today.  Her neuropathy symptoms are no worse. As described in the last progress note, it is her decision to move forward with cycle 3 of treatment today as written, leaving the docetaxel as is today. We will continue to monitor her peripheral sensation changes and switch to gemcitabine in the future as needed.   Terri Mayer will return in 1 week for labs and a nadir visit. She understands and agrees with this plan. She knows the goal of treatment in her case is cure. She has been encouraged to call with any issues that might arise before her next visit here.   Laurie Panda, NP   01/29/2016 9:05 AM

## 2016-02-05 ENCOUNTER — Other Ambulatory Visit (HOSPITAL_BASED_OUTPATIENT_CLINIC_OR_DEPARTMENT_OTHER): Payer: PPO

## 2016-02-05 ENCOUNTER — Encounter: Payer: Self-pay | Admitting: Nurse Practitioner

## 2016-02-05 ENCOUNTER — Ambulatory Visit (HOSPITAL_COMMUNITY)
Admission: RE | Admit: 2016-02-05 | Discharge: 2016-02-05 | Disposition: A | Discharge status: Home / Self Care | Payer: PPO | Source: Ambulatory Visit | Attending: Internal Medicine | Admitting: Internal Medicine

## 2016-02-05 ENCOUNTER — Ambulatory Visit (HOSPITAL_BASED_OUTPATIENT_CLINIC_OR_DEPARTMENT_OTHER): Payer: PPO | Admitting: Nurse Practitioner

## 2016-02-05 ENCOUNTER — Other Ambulatory Visit: Payer: Self-pay | Admitting: *Deleted

## 2016-02-05 ENCOUNTER — Telehealth: Payer: Self-pay | Admitting: Oncology

## 2016-02-05 ENCOUNTER — Telehealth: Payer: Self-pay | Admitting: *Deleted

## 2016-02-05 VITALS — BP 108/60 | HR 116 | Temp 98.7°F | Resp 18 | Ht 66.0 in | Wt 220.4 lb

## 2016-02-05 DIAGNOSIS — R197 Diarrhea, unspecified: Secondary | ICD-10-CM

## 2016-02-05 DIAGNOSIS — C50211 Malignant neoplasm of upper-inner quadrant of right female breast: Secondary | ICD-10-CM

## 2016-02-05 DIAGNOSIS — R110 Nausea: Secondary | ICD-10-CM

## 2016-02-05 DIAGNOSIS — R112 Nausea with vomiting, unspecified: Secondary | ICD-10-CM

## 2016-02-05 DIAGNOSIS — G620 Drug-induced polyneuropathy: Secondary | ICD-10-CM

## 2016-02-05 DIAGNOSIS — T451X5A Adverse effect of antineoplastic and immunosuppressive drugs, initial encounter: Secondary | ICD-10-CM

## 2016-02-05 DIAGNOSIS — E860 Dehydration: Secondary | ICD-10-CM | POA: Diagnosis not present

## 2016-02-05 DIAGNOSIS — R634 Abnormal weight loss: Secondary | ICD-10-CM

## 2016-02-05 DIAGNOSIS — R630 Anorexia: Secondary | ICD-10-CM | POA: Diagnosis not present

## 2016-02-05 LAB — COMPREHENSIVE METABOLIC PANEL
ALT: 103 U/L — AB (ref 0–55)
ANION GAP: 10 meq/L (ref 3–11)
AST: 41 U/L — ABNORMAL HIGH (ref 5–34)
Albumin: 3.9 g/dL (ref 3.5–5.0)
Alkaline Phosphatase: 78 U/L (ref 40–150)
BILIRUBIN TOTAL: 1.77 mg/dL — AB (ref 0.20–1.20)
BUN: 23.2 mg/dL (ref 7.0–26.0)
CALCIUM: 9.8 mg/dL (ref 8.4–10.4)
CO2: 28 mEq/L (ref 22–29)
CREATININE: 1 mg/dL (ref 0.6–1.1)
Chloride: 105 mEq/L (ref 98–109)
EGFR: 57 mL/min/{1.73_m2} — ABNORMAL LOW (ref >90)
Glucose: 106 mg/dl (ref 70–140)
Potassium: 4.1 mEq/L (ref 3.5–5.1)
Sodium: 143 mEq/L (ref 136–145)
TOTAL PROTEIN: 6.8 g/dL (ref 6.4–8.3)

## 2016-02-05 LAB — CBC WITH DIFFERENTIAL/PLATELET
BASO%: 0.3 % (ref 0.0–2.0)
Basophils Absolute: 0 10*3/uL (ref 0.0–0.1)
EOS%: 0.5 % (ref 0.0–7.0)
Eosinophils Absolute: 0 10*3/uL (ref 0.0–0.5)
HCT: 43.6 % (ref 34.8–46.6)
HGB: 14.3 g/dL (ref 11.6–15.9)
LYMPH#: 1.6 10*3/uL (ref 0.9–3.3)
LYMPH%: 24.8 % (ref 14.0–49.7)
MCH: 27.2 pg (ref 25.1–34.0)
MCHC: 32.8 g/dL (ref 31.5–36.0)
MCV: 83 fL (ref 79.5–101.0)
MONO#: 1.7 10*3/uL — AB (ref 0.1–0.9)
MONO%: 26.5 % — ABNORMAL HIGH (ref 0.0–14.0)
NEUT#: 3 10*3/uL (ref 1.5–6.5)
NEUT%: 47.9 % (ref 38.4–76.8)
PLATELETS: 287 10*3/uL (ref 145–400)
RBC: 5.25 10*6/uL (ref 3.70–5.45)
RDW: 15.6 % — ABNORMAL HIGH (ref 11.2–14.5)
WBC: 6.3 10*3/uL (ref 3.9–10.3)

## 2016-02-05 MED ORDER — SODIUM CHLORIDE 0.9 % IV SOLN
Freq: Once | INTRAVENOUS | Status: AC
  Administered 2016-02-05: 13:00:00 via INTRAVENOUS
  Filled 2016-02-05: qty 4

## 2016-02-05 MED ORDER — SODIUM CHLORIDE 0.9% FLUSH
10.0000 mL | INTRAVENOUS | Status: DC | PRN
  Administered 2016-02-05: 10 mL via INTRAVENOUS
  Filled 2016-02-05: qty 10

## 2016-02-05 MED ORDER — SODIUM CHLORIDE 0.9 % IV SOLN
Freq: Once | INTRAVENOUS | Status: AC
  Administered 2016-02-05: 12:00:00 via INTRAVENOUS

## 2016-02-05 MED ORDER — PROMETHAZINE HCL 25 MG RE SUPP: 25.0000 mg | Freq: Four times a day (QID) | RECTAL | Status: DC | PRN

## 2016-02-05 MED ORDER — HEPARIN SOD (PORK) LOCK FLUSH 100 UNIT/ML IV SOLN
500.0000 [IU] | Freq: Once | INTRAVENOUS | Status: AC
  Administered 2016-02-05: 500 [IU] via INTRAVENOUS
  Filled 2016-02-05: qty 5

## 2016-02-05 NOTE — Progress Notes (Signed)
This nurse called Terri Mayer concerning a meeting to be scheduled with pt. Pt denies depression or harming herself, but did say she's been feeling a little "blue" lately because of the burden of receiving chemo. I suggested that she talk to our Chaplain and she was open and interested. I have called and left a message for [name] and will follow- up.

## 2016-02-05 NOTE — Telephone Encounter (Signed)
Sent in basket to [facility] regarding adding 2 infusions to her schedule

## 2016-02-05 NOTE — Telephone Encounter (Signed)
Called pt to inform her of the appt times in case she needs hydration on 3/24 and 4/7. Pt told me she felt so much better after receiving ivf today. She said although she has a little nausea she's able to drink and eat, so far she's had a protein drink, 16 ozs water and a banana. She thanked Water quality scientist for taking good care of her today. Sent [place] suppository script to the [facility] per [employer]. Message to be fwd to H. Boelter,NP.

## 2016-02-05 NOTE — Addendum Note (Signed)
Addended by: [employer] D on: 02/05/2016 05:04 PM   Modules accepted: Orders

## 2016-02-05 NOTE — Progress Notes (Signed)
[place]  [phone] [phone]   ID: Terri Mayer DOB: 10-26-1951  MR#: [id number]  YFV#:[id number]  Patient Care Team: [name], MD as PCP - General (Internal Medicine) Chauncey Cruel, MD as Consulting Physician (Oncology) Rolm Bookbinder, MD as Consulting Physician (General Surgery) Thea Silversmith, MD as Consulting Physician (Radiation Oncology) Princess Bruins, MD as Consulting Physician (Obstetrics and Gynecology) Jolaine Artist, MD as Consulting Physician (Cardiology) PCP: [name], MD GYN: SU:  OTHER MD:  CHIEF COMPLAINT: right breast cancer  CURRENT TREATMENT: neoadjuvant chemotherapy  BREAST CANCER HISTORY: From the original intake note:  "Terri Mayer" underwent bilateral screening mammography with tomosynthesis at the breast center 11/02/2015 showing a possible mass in the right breast and low right axillary area. She was called back for right diagnostic mammography with tomosynthesis and right ultrasonography 11/14/2015. The breast density was category B. There was a lobulated mass in the lateral right breast, with a smaller oval mass immediately adjacent to it. Also in the upper outer quadrant there was a third mass. This has been stable back to 2014 and is felt to be a fibroadenoma.  Ultrasound of the right breast confirmed a hypoechoic mass at the 3:00 position 6 cm from the nipple, measuring 1.4 cm. Adjacent to this there was a 0.5 cm internal mammary lymph node, with normal morphology and normal hilar fat. The mass at the 10:00 position measured 0.8 cm and again was consistent with a fibroadenoma. In the right axilla there was an abnormal lymph node with no internal fat measuring 1.8 cm. The rest of the exam was unremarkable.  Biopsy of the right breast 8:30 o'clock lesion and the right axillary lymph node on 11/16/2015 showed ([id number]) both to be positive for invasive ductal carcinoma, grade 3, the  breast mass being estrogen receptor 10% positive with moderate staining intensity, but progesterone receptor negative, but the lymph node being estrogen and progesterone both negative. Both the breast and lymph nodes had MIB-1 of 30%. Both were positive for HER-2 amplification, the signals ratio is being 6.06 and 7.61, and the number per cell 10.00 and 11.80.  The patient's subsequent history is as detailed below   INTERVAL HISTORY: Terri Mayer returns for follow up of her HER-2 positive cancer, accompanied by a friend. Today is day 8, cycle 3 of 6 planned cycles of carboplatin, docetaxel, trastuzumab and pertuzumab.  REVIEW OF SYSTEMS: Terri Mayer admits that she has not been entirely forthcoming about the severity of her symptoms. She has been trying to be tough. This came to a head with cycle 3 of treatment. She has been vomiting for 3 days starting Friday. She was unable to keep any of her meds down, and was not able to eat or drink. She has lost 15lb in the past week. She now just has dry heaves because there is nothing left to get up. Her diarrhea is out of control, with multiple loose stools daily. This is slowing down She feels weak. She is tearful and doesn't know how she will complete 3 more cycles of treatment. The numbness to her bilateral heels has progressed. She denies mouth sores or rashes. She continues to use ativan PRN for sleep. A detailed review of systems is otherwise stable.  PAST MEDICAL HISTORY: Past Medical History  Diagnosis Date  . History of chicken pox   . Allergy   . Breast cancer ([place])   . Osteoarthritis     Neck,Back,Knees,Hips,Ankles    PAST SURGICAL HISTORY: Past  Surgical History  Procedure Laterality Date  . Eye surgery  at age 80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  . Wisdom tooth extraction    . Dilation and curettage of uterus    . Portacath placement Right 12/11/2015    Procedure: INSERTION PORT-A-CATH WITH ULTRASOUND;  Surgeon: Rolm Bookbinder, MD;  Location: [facility];  Service: General;  Laterality: Right;    FAMILY HISTORY Family History  Problem Relation Age of Onset  . Heart disease Father   . Diabetes      GM  . Colon cancer Neg Hx   . Breast cancer Neg Hx   . Dementia      M  . Transient ischemic attack      F   the patient's father died after multiple strokes at the age of 73. The patient's mother died at the age of 51 with Alzheimer's disease. The patient had one brother, one sister. The patient's paternal grandfather died from lung cancer in his 12s. On the mother's side there are 2 (second) cousins with breast cancer diagnosed in their early 58s, and a more remote cousin diagnosed with breast cancer at age 15. There is no history of ovarian cancer.   GYNECOLOGIC HISTORY:  No LMP recorded. Patient is postmenopausal.  menarche age 87, the patient is GX P0 and she understands that not carrying a child to term before the age of 33 essentially doubled the risk of breast cancer. She stopped having periods between age 70 and 71 and did not take hormone replacement. 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 She lives by herself, with her pet dog. Her son, adopted at birth, Terri Mayer, currently 20, attends [employer] in the psychology Department.     ADVANCED DIRECTIVES: Not in place. At the 02/05/2016 clinic visit the patient was given the appropriate forms to complete and notarize at her discretion.    HEALTH MAINTENANCE: Social History  Substance Use Topics  . Smoking status: Never Smoker   . Smokeless tobacco: Never Used  . Alcohol Use: Yes     Comment: rarely      Colonoscopy: 12/28/2004  PAP:  Bone density:  Lipid panel:  Allergies  Allergen Reactions  . Codeine Nausea And Vomiting    Current Outpatient Prescriptions  Medication Sig Dispense Refill  . dexamethasone (DECADRON) 4  MG tablet Take 2 tablets (8 mg total) by mouth 2 (two) times daily. Start the day before Taxotere. Then again the day after chemo for 3 days. 60 tablet 0  . lidocaine-prilocaine (EMLA) cream Apply to affected area once 30 g 3  . ondansetron (ZOFRAN) 8 MG tablet Take 1 tablet (8 mg total) by mouth 2 (two) times daily. Start the day after chemo for 3 days. Then take as needed for nausea or vomiting. 30 tablet 1  . prochlorperazine (COMPAZINE) 10 MG tablet Take 1 tablet (10 mg total) by mouth every 6 (six) hours as needed (Nausea or vomiting). 30 tablet 1  . cholestyramine (QUESTRAN) 4 g packet Take 4 g in slurry twice daily as needed for diarrhea (Patient not taking: Reported on 01/08/2016) 60 each 12  . clobetasol cream (TEMOVATE) 4.09 % Apply 1 application topically 2 (two) times daily. Reported on 02/05/2016  4  . IBUPROFEN PO Take by mouth as needed. Reported on 02/05/2016    .  LORazepam (ATIVAN) 0.5 MG tablet Take 1 tablet (0.5 mg total) by mouth at bedtime as needed (Nausea or vomiting). (Patient not taking: Reported on 02/05/2016) 30 tablet 0  . nystatin-triamcinolone ointment (MYCOLOG) Apply 1 application topically 2 (two) times daily. (Patient not taking: Reported on 01/15/2016) 90 g 6   No current facility-administered medications for this visit.   Facility-Administered Medications Ordered in Other Visits  Medication Dose Route Frequency Provider Last Rate Last Dose  . sodium chloride flush (NS) 0.9 % injection 10 mL  10 mL Intravenous PRN Sheffield Slider, NP   10 mL at 02/05/16 1447    OBJECTIVE: Middle-aged white woman who appears stated age  65 Vitals:   02/05/16 1127  BP: 108/60  Pulse: 116  Temp: 98.7 F (37.1 C)  Resp: 18     Body mass index is 35.59 kg/(m^2).    ECOG FS:1 - Symptomatic but completely ambulatory  Sclerae unicteric, pupils round and equal Oropharynx clear and moist-- no thrush or other lesions No cervical or supraclavicular adenopathy Lungs no rales or  rhonchi Heart regular rate and rhythm Abd soft, nontender, positive bowel sounds MSK no focal spinal tenderness, no upper extremity lymphedema Neuro: nonfocal, well oriented, distressed affect Breasts: deferred  LAB RESULTS:  CMP     Component Value Date/Time   NA 143 02/05/2016 1058   K 4.1 02/05/2016 1058   CO2 28 02/05/2016 1058   GLUCOSE 106 02/05/2016 1058   BUN 23.2 02/05/2016 1058   CREATININE 1.0 02/05/2016 1058   CALCIUM 9.8 02/05/2016 1058   PROT 6.8 02/05/2016 1058   ALBUMIN 3.9 02/05/2016 1058   AST 41* 02/05/2016 1058   ALT 103* 02/05/2016 1058   ALKPHOS 78 02/05/2016 1058   BILITOT 1.77* 02/05/2016 1058    INo results found for: SPEP, UPEP  Lab Results  Component Value Date   WBC 6.3 02/05/2016   NEUTROABS 3.0 02/05/2016   HGB 14.3 02/05/2016   HCT 43.6 02/05/2016   MCV 83.0 02/05/2016   PLT 287 02/05/2016      Chemistry      Component Value Date/Time   NA 143 02/05/2016 1058   K 4.1 02/05/2016 1058   CO2 28 02/05/2016 1058   BUN 23.2 02/05/2016 1058   CREATININE 1.0 02/05/2016 1058      Component Value Date/Time   CALCIUM 9.8 02/05/2016 1058   ALKPHOS 78 02/05/2016 1058   AST 41* 02/05/2016 1058   ALT 103* 02/05/2016 1058   BILITOT 1.77* 02/05/2016 1058       No results found for: LABCA2  No components found for: LABCA125  No results for input(s): INR in the last 168 hours.  Urinalysis    Component Value Date/Time   BILIRUBINUR neg 12/29/2015 1014   PROTEINUR pos 12/29/2015 1014   UROBILINOGEN negative 12/29/2015 1014   NITRITE neg 12/29/2015 1014   LEUKOCYTESUR Negative 12/29/2015 1014   STUDIES: No results found.   ASSESSMENT: 65 y.o. [place] woman status post right breast lower outer quadrant and right axillary lymph node biopsy 11/16/2015, also positive for a clinical  T1c pN1, stage IIA invasive ductal carcinoma grade 3, essentially estrogen and progesterone receptor negative but HER-2 amplified, with an MIB-1 of  30%  (a) the breast lesion had moderate positivity for the estrogen receptor at 10%; the axillary lymph node lesion was estrogen receptor negative. Both were progesterone receptor negative  (1) neoadjuvant chemotherapy with carboplatin, docetaxel, trastuzumab and pertuzumab for 6 cycles, started 12/18/2015  (a) docetaxel  switched for gemcitabine for cycles 4-6 because of progressive neuropathy  (b) pertuzumab discontinued after 3 cycles due to excessive diarrhea  (2) trastuzumab to be continued to total one year  (a) baseline echocardiogram on 12/12/15 showed an EF of 60-65%  (3) breast conserving surgery with consideration of the Alliance trial to follow  (4) adjuvant radiation as appropriate  PLAN: Terri Mayer feels very poorly today. We are sending her IV fluids and zofran IV shortly. While she recovers over the next week, here are several changes that we are going to make with cycle 4 of treatment and beyond. First I am adding aloxi to her premed list. She understands that this is closely related to zofran, so she will not start this med at home until day 3 of treatment. Due to the advancement of per peripheral neuropathy we will switch docetaxel out for gemcitabine, which was discussed in the previous progress note. Her diarrhea was so severe this cycle, that I think it would be prudent to remove the pertuzumab from the lineup. She is very hesitant to do this, but was agreeable to moving without this drug for cycle 4 if we left the option open to reintroduce it with cycle 5 or 6 if she performs well.  When her nausea is under better control she will be able to take the imodium every 6 hours as directed and questran powder BID. I offered her a prescription for phenergan suppositories to use just in case she was unable to keep medicines down again, and she declined. I printed a new antiemetic "road map" and we reviewed her symptom management options. I revealed to her that the ativan can be used  sublingually for nausea as well.   She will use a protein shake to supplement her lack of oral intake. We are working on a nutritionist visit with her next treatment. We have also forwarded her information to our chaplain, [name], to reach out to her for support.   I have scheduled Terri Mayer for IV fluids again this Friday, as well as on day 5 of the next cycle which she may find helpful. If she wakes up feeling well on these days she is welcome to cancel the appointment. She understands and agrees with this plan. She knows the goal of treatment in her case is cure. She has been encouraged to call with any issues that might arise before her next visit here.   Laurie Panda, NP   02/05/2016 3:30 PM

## 2016-02-05 NOTE — Procedures (Addendum)
[facility]  Procedure Note  Terri Mayer [id number] DOB: September 14, 1951 DOA: 02/05/2016   PCP: [name], MD   Associated Diagnosis: Dehydration (276.51)  Procedure Note: IV infusion of Zofran and 1 liter NS   Condition During Procedure:  Pt tolerated well   Condition at Discharge:  Pt alert, oriented, no complications noted   Nigel Sloop, [facility]

## 2016-02-05 NOTE — Telephone Encounter (Signed)
Per staff message and POF I have scheduled appts. Advised scheduler of appts. JMW  

## 2016-02-07 ENCOUNTER — Encounter: Payer: Self-pay | Admitting: General Practice

## 2016-02-07 NOTE — Progress Notes (Signed)
Spiritual Care Note  Reached Ms Weeks by phone per referral for emotional support from [place], LPN.  Pt was cheerful and welcoming of call, speaking frankly about her struggles with nausea and morale related to chemo.  She is particularly concerned with how discontinuing the most difficult (nausea-inducing) chemo drug may affect her tx/disease progression.  She reports emotional support from friends and church [facility]), also identifying challenges of living alone and thus lacking immediate logistical support, esp related to vomiting/diarrhea and dehydration.  She uses prayer, humor, and perspective to cope.  Built rapport, normalized feelings, introduced Spiritual Care/chaplain availability as part of [facility] Support Team.  [employer]; pt declined, noting that she has attended Breast Cancer Support Group and found that people's individual personal experiences have varied widely from her own.  She has not found it helpful to connect with people whose physical reactions have been less dramatic (no nausea, hair loss, etc).  Plan to f/u by phone/in person for further support/encouragement, but please also page as needs arise.  Thank you.  [place], [place], [facility] Pager [phone] Voicemail  [phone]

## 2016-02-08 ENCOUNTER — Telehealth: Payer: Self-pay | Admitting: Oncology

## 2016-02-08 NOTE — Telephone Encounter (Signed)
Patient called today to cxl ivf appt 3/24

## 2016-02-09 ENCOUNTER — Ambulatory Visit: Payer: PPO

## 2016-02-12 ENCOUNTER — Other Ambulatory Visit: Payer: Self-pay | Admitting: Nurse Practitioner

## 2016-02-19 ENCOUNTER — Other Ambulatory Visit: Payer: Self-pay | Admitting: Oncology

## 2016-02-19 ENCOUNTER — Encounter: Payer: Self-pay | Admitting: Nurse Practitioner

## 2016-02-19 ENCOUNTER — Ambulatory Visit: Payer: PPO | Admitting: Nutrition

## 2016-02-19 ENCOUNTER — Encounter: Payer: Self-pay | Admitting: General Practice

## 2016-02-19 ENCOUNTER — Other Ambulatory Visit (HOSPITAL_BASED_OUTPATIENT_CLINIC_OR_DEPARTMENT_OTHER): Payer: PPO

## 2016-02-19 ENCOUNTER — Telehealth: Payer: Self-pay | Admitting: Nurse Practitioner

## 2016-02-19 ENCOUNTER — Ambulatory Visit (HOSPITAL_BASED_OUTPATIENT_CLINIC_OR_DEPARTMENT_OTHER): Payer: PPO

## 2016-02-19 ENCOUNTER — Ambulatory Visit (HOSPITAL_BASED_OUTPATIENT_CLINIC_OR_DEPARTMENT_OTHER): Payer: PPO | Admitting: Nurse Practitioner

## 2016-02-19 ENCOUNTER — Encounter: Payer: Self-pay | Admitting: *Deleted

## 2016-02-19 VITALS — BP 104/51 | HR 92 | Temp 98.6°F | Resp 18 | Ht 66.0 in | Wt 225.0 lb

## 2016-02-19 DIAGNOSIS — C50211 Malignant neoplasm of upper-inner quadrant of right female breast: Secondary | ICD-10-CM | POA: Diagnosis not present

## 2016-02-19 DIAGNOSIS — C773 Secondary and unspecified malignant neoplasm of axilla and upper limb lymph nodes: Secondary | ICD-10-CM | POA: Diagnosis not present

## 2016-02-19 DIAGNOSIS — Z5111 Encounter for antineoplastic chemotherapy: Secondary | ICD-10-CM

## 2016-02-19 DIAGNOSIS — Z5112 Encounter for antineoplastic immunotherapy: Secondary | ICD-10-CM

## 2016-02-19 DIAGNOSIS — C50511 Malignant neoplasm of lower-outer quadrant of right female breast: Secondary | ICD-10-CM | POA: Diagnosis not present

## 2016-02-19 DIAGNOSIS — E860 Dehydration: Secondary | ICD-10-CM | POA: Diagnosis not present

## 2016-02-19 LAB — COMPREHENSIVE METABOLIC PANEL
ALT: 27 U/L (ref 0–55)
ANION GAP: 9 meq/L (ref 3–11)
AST: 20 U/L (ref 5–34)
Albumin: 3.4 g/dL — ABNORMAL LOW (ref 3.5–5.0)
Alkaline Phosphatase: 54 U/L (ref 40–150)
BILIRUBIN TOTAL: 1.08 mg/dL (ref 0.20–1.20)
BUN: 18.8 mg/dL (ref 7.0–26.0)
CALCIUM: 9.9 mg/dL (ref 8.4–10.4)
CO2: 28 meq/L (ref 22–29)
CREATININE: 0.9 mg/dL (ref 0.6–1.1)
Chloride: 106 mEq/L (ref 98–109)
EGFR: 71 mL/min/{1.73_m2} — ABNORMAL LOW (ref >90)
Glucose: 127 mg/dl (ref 70–140)
Potassium: 4.9 mEq/L (ref 3.5–5.1)
Sodium: 143 mEq/L (ref 136–145)
TOTAL PROTEIN: 6.5 g/dL (ref 6.4–8.3)

## 2016-02-19 LAB — CBC WITH DIFFERENTIAL/PLATELET
BASO%: 0.2 % (ref 0.0–2.0)
Basophils Absolute: 0 10*3/uL (ref 0.0–0.1)
EOS%: 0 % (ref 0.0–7.0)
Eosinophils Absolute: 0 10*3/uL (ref 0.0–0.5)
HEMATOCRIT: 36.7 % (ref 34.8–46.6)
HGB: 12.2 g/dL (ref 11.6–15.9)
LYMPH#: 0.9 10*3/uL (ref 0.9–3.3)
LYMPH%: 11.3 % — ABNORMAL LOW (ref 14.0–49.7)
MCH: 27.3 pg (ref 25.1–34.0)
MCHC: 33.3 g/dL (ref 31.5–36.0)
MCV: 82 fL (ref 79.5–101.0)
MONO#: 0.4 10*3/uL (ref 0.1–0.9)
MONO%: 4.6 % (ref 0.0–14.0)
NEUT%: 83.9 % — ABNORMAL HIGH (ref 38.4–76.8)
NEUTROS ABS: 6.5 10*3/uL (ref 1.5–6.5)
PLATELETS: 223 10*3/uL (ref 145–400)
RBC: 4.47 10*6/uL (ref 3.70–5.45)
RDW: 16.9 % — AB (ref 11.2–14.5)
WBC: 7.7 10*3/uL (ref 3.9–10.3)

## 2016-02-19 MED ORDER — PALONOSETRON HCL INJECTION 0.25 MG/5ML
INTRAVENOUS | Status: AC
  Filled 2016-02-19: qty 5

## 2016-02-19 MED ORDER — SODIUM CHLORIDE 0.9 % IV SOLN
210.0000 mg | Freq: Once | INTRAVENOUS | Status: AC
  Administered 2016-02-19: 210 mg via INTRAVENOUS
  Filled 2016-02-19: qty 7

## 2016-02-19 MED ORDER — DIPHENHYDRAMINE HCL 25 MG PO CAPS
50.0000 mg | ORAL_CAPSULE | Freq: Once | ORAL | Status: AC
  Administered 2016-02-19: 50 mg via ORAL

## 2016-02-19 MED ORDER — ACETAMINOPHEN 325 MG PO TABS
ORAL_TABLET | ORAL | Status: AC
  Filled 2016-02-19: qty 2

## 2016-02-19 MED ORDER — ACETAMINOPHEN 325 MG PO TABS
650.0000 mg | ORAL_TABLET | Freq: Once | ORAL | Status: AC
  Administered 2016-02-19: 650 mg via ORAL

## 2016-02-19 MED ORDER — TRASTUZUMAB CHEMO INJECTION 440 MG
6.0000 mg/kg | Freq: Once | INTRAVENOUS | Status: AC
  Administered 2016-02-19: 609 mg via INTRAVENOUS
  Filled 2016-02-19: qty 29

## 2016-02-19 MED ORDER — SODIUM CHLORIDE 0.9 % IV SOLN
Freq: Once | INTRAVENOUS | Status: AC
  Administered 2016-02-19: 11:00:00 via INTRAVENOUS
  Filled 2016-02-19: qty 2

## 2016-02-19 MED ORDER — PEGFILGRASTIM 6 MG/0.6ML ~~LOC~~ PSKT
6.0000 mg | PREFILLED_SYRINGE | Freq: Once | SUBCUTANEOUS | Status: AC
  Administered 2016-02-19: 6 mg via SUBCUTANEOUS
  Filled 2016-02-19: qty 0.6

## 2016-02-19 MED ORDER — HEPARIN SOD (PORK) LOCK FLUSH 100 UNIT/ML IV SOLN
500.0000 [IU] | Freq: Once | INTRAVENOUS | Status: AC | PRN
  Administered 2016-02-19: 500 [IU]
  Filled 2016-02-19: qty 5

## 2016-02-19 MED ORDER — SODIUM CHLORIDE 0.9 % IV SOLN
633.0000 mg | Freq: Once | INTRAVENOUS | Status: AC
  Administered 2016-02-19: 630 mg via INTRAVENOUS
  Filled 2016-02-19: qty 63

## 2016-02-19 MED ORDER — SODIUM CHLORIDE 0.9 % IJ SOLN
10.0000 mL | INTRAMUSCULAR | Status: DC | PRN
  Administered 2016-02-19: 10 mL
  Filled 2016-02-19: qty 10

## 2016-02-19 MED ORDER — SODIUM CHLORIDE 0.9 % IV SOLN: Freq: Once | INTRAVENOUS | Status: DC

## 2016-02-19 MED ORDER — GEMCITABINE HCL CHEMO INJECTION 1 GM/26.3ML
800.0000 mg/m2 | Freq: Once | INTRAVENOUS | Status: AC
  Administered 2016-02-19: 1862 mg via INTRAVENOUS
  Filled 2016-02-19: qty 48.97

## 2016-02-19 MED ORDER — PALONOSETRON HCL INJECTION 0.25 MG/5ML
0.2500 mg | Freq: Once | INTRAVENOUS | Status: AC
  Administered 2016-02-19: 0.25 mg via INTRAVENOUS

## 2016-02-19 MED ORDER — DIPHENHYDRAMINE HCL 25 MG PO CAPS
ORAL_CAPSULE | ORAL | Status: AC
  Filled 2016-02-19: qty 2

## 2016-02-19 MED ORDER — SODIUM CHLORIDE 0.9 % IV SOLN
Freq: Once | INTRAVENOUS | Status: AC
  Administered 2016-02-19: 11:00:00 via INTRAVENOUS

## 2016-02-19 MED FILL — PROMETHAZINE 25 MG SUPP: 25 | 3 days supply | Qty: 12 | Fill #0

## 2016-02-19 NOTE — Progress Notes (Signed)
Pt tolerated infusion well. Pt stable at time of discharge.  

## 2016-02-19 NOTE — Telephone Encounter (Signed)
appt made and avs printed °

## 2016-02-19 NOTE — Patient Instructions (Signed)
[facility] Discharge Instructions for Patients Receiving Chemotherapy  Today you received the following chemotherapy agents: Herceptin, Perjeta, Gemzar, Carboplatin   To help prevent nausea and vomiting after your treatment, we encourage you to take your nausea medication as directed.    If you develop nausea and vomiting that is not controlled by your nausea medication, call the clinic.   BELOW ARE SYMPTOMS THAT SHOULD BE REPORTED IMMEDIATELY:  *FEVER GREATER THAN 100.5 F  *CHILLS WITH OR WITHOUT FEVER  NAUSEA AND VOMITING THAT IS NOT CONTROLLED WITH YOUR NAUSEA MEDICATION  *UNUSUAL SHORTNESS OF BREATH  *UNUSUAL BRUISING OR BLEEDING  TENDERNESS IN MOUTH AND THROAT WITH OR WITHOUT PRESENCE OF ULCERS  *URINARY PROBLEMS  *BOWEL PROBLEMS  UNUSUAL RASH Items with * indicate a potential emergency and should be followed up as soon as possible.  Feel free to call the clinic you have any questions or concerns. The clinic phone number is [phone].  Please show the [facility] at check-in to the Emergency Department and triage nurse.   Gemcitabine injection What is this medicine? GEMCITABINE (jem SIT a been) is a chemotherapy drug. This medicine is used to treat many types of cancer like breast cancer, lung cancer, pancreatic cancer, and ovarian cancer. This medicine may be used for other purposes; ask your health care provider or pharmacist if you have questions. What should I tell my health care provider before I take this medicine? They need to know if you have any of these conditions: -blood disorders -infection -kidney disease -liver disease -recent or ongoing radiation therapy -an unusual or allergic reaction to gemcitabine, other chemotherapy, other medicines, foods, dyes, or preservatives -pregnant or trying to get pregnant -breast-feeding How should I use this medicine? This drug is given as an infusion into a vein. It is administered in a  hospital or clinic by a specially trained health care professional. Talk to your pediatrician regarding the use of this medicine in children. Special care may be needed. Overdosage: If you think you have taken too much of this medicine contact a poison control center or emergency room at once. NOTE: This medicine is only for you. Do not share this medicine with others. What if I miss a dose? It is important not to miss your dose. Call your doctor or health care professional if you are unable to keep an appointment. What may interact with this medicine? -medicines to increase blood counts like filgrastim, pegfilgrastim, sargramostim -some other chemotherapy drugs like cisplatin -vaccines Talk to your doctor or health care professional before taking any of these medicines: -acetaminophen -aspirin -ibuprofen -ketoprofen -naproxen This list may not describe all possible interactions. Give your health care provider a list of all the medicines, herbs, non-prescription drugs, or dietary supplements you use. Also tell them if you smoke, drink alcohol, or use illegal drugs. Some items may interact with your medicine. What should I watch for while using this medicine? Visit your doctor for checks on your progress. This drug may make you feel generally unwell. This is not uncommon, as chemotherapy can affect healthy cells as well as cancer cells. Report any side effects. Continue your course of treatment even though you feel ill unless your doctor tells you to stop. In some cases, you may be given additional medicines to help with side effects. Follow all directions for their use. Call your doctor or health care professional for advice if you get a fever, chills or sore throat, or other symptoms of a cold  or flu. Do not treat yourself. This drug decreases your body's ability to fight infections. Try to avoid being around people who are sick. This medicine may increase your risk to bruise or bleed. Call  your doctor or health care professional if you notice any unusual bleeding. Be careful brushing and flossing your teeth or using a toothpick because you may get an infection or bleed more easily. If you have any dental work done, tell your dentist you are receiving this medicine. Avoid taking products that contain aspirin, acetaminophen, ibuprofen, naproxen, or ketoprofen unless instructed by your doctor. These medicines may hide a fever. Women should inform their doctor if they wish to become pregnant or think they might be pregnant. There is a potential for serious side effects to an unborn child. Talk to your health care professional or pharmacist for more information. Do not breast-feed an infant while taking this medicine. What side effects may I notice from receiving this medicine? Side effects that you should report to your doctor or health care professional as soon as possible: -allergic reactions like skin rash, itching or hives, swelling of the face, lips, or tongue -low blood counts - this medicine may decrease the number of white blood cells, red blood cells and platelets. You may be at increased risk for infections and bleeding. -signs of infection - fever or chills, cough, sore throat, pain or difficulty passing urine -signs of decreased platelets or bleeding - bruising, pinpoint red spots on the skin, black, tarry stools, blood in the urine -signs of decreased red blood cells - unusually weak or tired, fainting spells, lightheadedness -breathing problems -chest pain -mouth sores -nausea and vomiting -pain, swelling, redness at site where injected -pain, tingling, numbness in the hands or feet -stomach pain -swelling of ankles, feet, hands -unusual bleeding Side effects that usually do not require medical attention (report to your doctor or health care professional if they continue or are bothersome): -constipation -diarrhea -hair loss -loss of appetite -stomach upset This  list may not describe all possible side effects. Call your doctor for medical advice about side effects. You may report side effects to FDA at 1-800-FDA-1088. Where should I keep my medicine? This drug is given in a hospital or clinic and will not be stored at home. NOTE: This sheet is a summary. It may not cover all possible information. If you have questions about this medicine, talk to your doctor, pharmacist, or health care provider.    2016, Elsevier/Gold Standard. (2008-03-15 18:45:54)

## 2016-02-19 NOTE — Progress Notes (Signed)
65 year old female diagnosed with breast cancer.  She is a patient of Dr. Jana Hakim  Past medical history includes chickenpox and osteoarthritis.  Medications include Questran, Decadron, Ativan, Zofran, Compazine, and Phenergan.  Labs include albumin 3.9 on March 20.  Height: 66 inches. Weight: 225 pounds. Usual body weight: 259 pounds January 19. BMI: 36.33.  Patient reports she has had nausea and vomiting after chemotherapy for approximately 3 days. She continues to have dry heaves occasionally. She also has diarrhea after chemotherapy. Patient concerned with diet intolerances Patient reports she consumes a lot of grapefruit juice and other juices. She has consumed over 4 L of grapefruit juice since beginning of treatment.  Nutrition diagnosis:  Unintended weight loss related to breast cancer and associated treatments as evidenced by no prior need for nutrition related information.  Intervention: Educated patient to consume small frequent meals and snacks consisting of adequate calories and protein to promote maintenance of lean body mass. Provided fact sheet on increasing protein and calories. Reviewed strategies for eating with nausea and vomiting and diarrhea and provided fact sheets on each. Encouraged patient to take nausea medications as prescribed by physician. Contacted pharmacist to review contraindications with medications with patient's increase consumption of grapefruit juice. Questions were answered.  Teach back method used.  Contact information was provided.  Monitoring, evaluation, goals: Patient will tolerate adequate calories and protein to promote maintenance of lean body mass.  Next visit: Monday, April 24, during infusion.  **Disclaimer: This note was dictated with voice recognition software. Similar sounding words can inadvertently be transcribed and this note may contain transcription errors which may not have been corrected upon publication of note.**

## 2016-02-19 NOTE — Progress Notes (Signed)
Spiritual Care Note  Followed up with Terri Mayer in infusion.  She describes herself as "doing much better than we talked by phone," noting that she now has more proactive tools in her chemo coping toolkit: understanding when to call to report sx/request fluids, has fluid appts already set up, making dietary changes per [facility]/RD.  Per pt, feeling better physically, being more familiar with her chemo side effects, and being able to get to church the last two Sundays have all helped her morale.  She reports good emotional support from friends/community.  Per pt, she has chaplain's number right beside her computer and plans to reach out whenever needed/desired.  Please also page if needs arise/circumstances change.  Thank you.  [place], [place], [facility] Pager [phone] Voicemail  [phone]

## 2016-02-19 NOTE — Progress Notes (Signed)
[place]  [phone] [phone]) [phone]   ID: MARIANGEL RINGLEY DOB: 03/18/1951  MR#: [id number]  RXV#:[id number]  Patient Care Team: [name], MD as PCP - General (Internal Medicine) Chauncey Cruel, MD as Consulting Physician (Oncology) Rolm Bookbinder, MD as Consulting Physician (General Surgery) Thea Silversmith, MD as Consulting Physician (Radiation Oncology) Princess Bruins, MD as Consulting Physician (Obstetrics and Gynecology) Jolaine Artist, MD as Consulting Physician (Cardiology) PCP: [name], MD GYN: SU:  OTHER MD:  CHIEF COMPLAINT: right breast cancer  CURRENT TREATMENT: neoadjuvant chemotherapy  BREAST CANCER HISTORY: From the original intake note:  "Terri Mayer" underwent bilateral screening mammography with tomosynthesis at the breast center 11/02/2015 showing a possible mass in the right breast and low right axillary area. She was called back for right diagnostic mammography with tomosynthesis and right ultrasonography 11/14/2015. The breast density was category B. There was a lobulated mass in the lateral right breast, with a smaller oval mass immediately adjacent to it. Also in the upper outer quadrant there was a third mass. This has been stable back to 2014 and is felt to be a fibroadenoma.  Ultrasound of the right breast confirmed a hypoechoic mass at the 3:00 position 6 cm from the nipple, measuring 1.4 cm. Adjacent to this there was a 0.5 cm internal mammary lymph node, with normal morphology and normal hilar fat. The mass at the 10:00 position measured 0.8 cm and again was consistent with a fibroadenoma. In the right axilla there was an abnormal lymph node with no internal fat measuring 1.8 cm. The rest of the exam was unremarkable.  Biopsy of the right breast 8:30 o'clock lesion and the right axillary lymph node on 11/16/2015 showed ([id number]) both to be positive for invasive ductal carcinoma, grade 3, the  breast mass being estrogen receptor 10% positive with moderate staining intensity, but progesterone receptor negative, but the lymph node being estrogen and progesterone both negative. Both the breast and lymph nodes had MIB-1 of 30%. Both were positive for HER-2 amplification, the signals ratio is being 6.06 and 7.61, and the number per cell 10.00 and 11.80.  The patient's subsequent history is as detailed below   INTERVAL HISTORY: Terri Mayer returns for follow up of her HER-2 positive cancer, accompanied by a friend. Today is day 1, cycle 4 of 6 planned cycles of carboplatin, gemcitabine, trastuzumab and pertuzumab.  REVIEW OF SYSTEMS: Terri Mayer is much improved today. She denies fevers, chills, nausea, or vomiting. She is no longer having diarrhea. Her appetite has improved. She has an appointment with our nutritionist this afternoon. She has regained 5lbs of the 15 that she previously lost. She denies mouth sores. She still has the rash to her cheeks, but it is no worse. Her energy level is decent. She is sleeping well with ativan PRN. She has continued numbness to her bilateral heels. It does not impair her ability to walk. A detailed review of systems is otherwise stable.   PAST MEDICAL HISTORY: Past Medical History  Diagnosis Date  . History of chicken pox   . Allergy   . Breast cancer ([place])   . Osteoarthritis     Neck,Back,Knees,Hips,Ankles    PAST SURGICAL HISTORY: Past Surgical History  Procedure Laterality Date  . Eye surgery  at age 65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  . Wisdom tooth extraction    . Dilation and curettage of uterus    . Portacath placement Right 12/11/2015    Procedure: INSERTION PORT-A-CATH  WITH ULTRASOUND;  Surgeon: Rolm Bookbinder, MD;  Location: [facility];  Service: General;  Laterality: Right;    FAMILY HISTORY Family History  Problem Relation Age of Onset  . Heart disease Father   . Diabetes      GM  . Colon cancer Neg Hx   . Breast cancer Neg Hx     . Dementia      M  . Transient ischemic attack      F   the patient's father died after multiple strokes at the age of 2. The patient's mother died at the age of 25 with Alzheimer's disease. The patient had one brother, one sister. The patient's paternal grandfather died from lung cancer in his 79s. On the mother's side there are 2 (second) cousins with breast cancer diagnosed in their early 63s, and a more remote cousin diagnosed with breast cancer at age 11. There is no history of ovarian cancer.   GYNECOLOGIC HISTORY:  No LMP recorded. Patient is postmenopausal.  menarche age 10, the patient is GX P0 and she understands that not carrying a child to term before the age of 72 essentially doubled the risk of breast cancer. She stopped having periods between age 65 and 58 and did not take hormone replacement. 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 She lives by herself, with her pet dog. Her son, adopted at birth, Ailey Wessling, currently 20, attends [employer] in the psychology Department.     ADVANCED DIRECTIVES: Not in place. At the 02/19/2016 clinic visit the patient was given the appropriate forms to complete and notarize at her discretion.    HEALTH MAINTENANCE: Social History  Substance Use Topics  . Smoking status: Never Smoker   . Smokeless tobacco: Never Used  . Alcohol Use: Yes     Comment: rarely      Colonoscopy: 12/28/2004  PAP:  Bone density:  Lipid panel:  Allergies  Allergen Reactions  . Codeine Nausea And Vomiting    Current Outpatient Prescriptions  Medication Sig Dispense Refill  . dexamethasone (DECADRON) 4 MG tablet Take 2 tablets (8 mg total) by mouth 2 (two) times daily. Start the day before Taxotere. Then again the day after chemo for 3 days. 60 tablet 0  . lidocaine-prilocaine (EMLA) cream Apply to affected area once 30  g 3  . ondansetron (ZOFRAN) 8 MG tablet Take 1 tablet (8 mg total) by mouth 2 (two) times daily. Start the day after chemo for 3 days. Then take as needed for nausea or vomiting. 30 tablet 1  . cholestyramine (QUESTRAN) 4 g packet Take 4 g in slurry twice daily as needed for diarrhea (Patient not taking: Reported on 01/08/2016) 60 each 12  . clobetasol cream (TEMOVATE) 9.62 % Apply 1 application topically 2 (two) times daily. Reported on 02/19/2016  4  . IBUPROFEN PO Take by mouth as needed. Reported on 02/19/2016    . LORazepam (ATIVAN) 0.5 MG tablet Take 1 tablet (0.5 mg total) by mouth at bedtime as needed (Nausea or vomiting). (Patient not taking: Reported on 02/05/2016) 30 tablet 0  . nystatin-triamcinolone ointment (MYCOLOG) Apply 1 application topically 2 (two) times daily. (Patient not taking: Reported on 01/15/2016) 90 g 6  . prochlorperazine (COMPAZINE) 10 MG tablet Take 1 tablet (10 mg total) by mouth every 6 (six) hours as  needed (Nausea or vomiting). (Patient not taking: Reported on 02/19/2016) 30 tablet 1  . promethazine (PHENERGAN) 25 MG suppository Place 1 suppository (25 mg total) rectally every 6 (six) hours as needed for nausea or vomiting. (Patient not taking: Reported on 02/19/2016) 12 each 0   No current facility-administered medications for this visit.    OBJECTIVE: Middle-aged white woman who appears stated age  39 Vitals:   02/19/16 0908  BP: 104/51  Pulse: 92  Temp: 98.6 F (37 C)  Resp: 18     Body mass index is 36.33 kg/(m^2).    ECOG FS:1 - Symptomatic but completely ambulatory  Sclerae unicteric, pupils round and equal Oropharynx clear and moist-- no thrush or other lesions No cervical or supraclavicular adenopathy Lungs no rales or rhonchi Heart regular rate and rhythm Abd soft, nontender, positive bowel sounds MSK no focal spinal tenderness, no upper extremity lymphedema Neuro: nonfocal, well oriented, distressed affect Breasts: deferred  LAB  RESULTS:  CMP     Component Value Date/Time   NA 143 02/19/2016 0850   K 4.9 02/19/2016 0850   CO2 28 02/19/2016 0850   GLUCOSE 127 02/19/2016 0850   BUN 18.8 02/19/2016 0850   CREATININE 0.9 02/19/2016 0850   CALCIUM 9.9 02/19/2016 0850   PROT 6.5 02/19/2016 0850   ALBUMIN 3.4* 02/19/2016 0850   AST 20 02/19/2016 0850   ALT 27 02/19/2016 0850   ALKPHOS 54 02/19/2016 0850   BILITOT 1.08 02/19/2016 0850    INo results found for: SPEP, UPEP  Lab Results  Component Value Date   WBC 7.7 02/19/2016   NEUTROABS 6.5 02/19/2016   HGB 12.2 02/19/2016   HCT 36.7 02/19/2016   MCV 82.0 02/19/2016   PLT 223 02/19/2016      Chemistry      Component Value Date/Time   NA 143 02/19/2016 0850   K 4.9 02/19/2016 0850   CO2 28 02/19/2016 0850   BUN 18.8 02/19/2016 0850   CREATININE 0.9 02/19/2016 0850      Component Value Date/Time   CALCIUM 9.9 02/19/2016 0850   ALKPHOS 54 02/19/2016 0850   AST 20 02/19/2016 0850   ALT 27 02/19/2016 0850   BILITOT 1.08 02/19/2016 0850       No results found for: LABCA2  No components found for: LABCA125  No results for input(s): INR in the last 168 hours.  Urinalysis    Component Value Date/Time   BILIRUBINUR neg 12/29/2015 1014   PROTEINUR pos 12/29/2015 1014   UROBILINOGEN negative 12/29/2015 1014   NITRITE neg 12/29/2015 1014   LEUKOCYTESUR Negative 12/29/2015 1014   STUDIES: No results found.   ASSESSMENT: 65 y.o. [place] woman status post right breast lower outer quadrant and right axillary lymph node biopsy 11/16/2015, also positive for a clinical  T1c pN1, stage IIA invasive ductal carcinoma grade 3, essentially estrogen and progesterone receptor negative but HER-2 amplified, with an MIB-1 of 30%  (a) the breast lesion had moderate positivity for the estrogen receptor at 10%; the axillary lymph node lesion was estrogen receptor negative. Both were progesterone receptor negative  (1) neoadjuvant chemotherapy with  carboplatin, docetaxel, trastuzumab and pertuzumab for 6 cycles, started 12/18/2015  (a) docetaxel switched for gemcitabine for cycles 4-6 because of progressive neuropathy  (b) pertuzumab discontinued after 3 cycles due to excessive diarrhea  (2) trastuzumab to be continued to total one year  (a) baseline echocardiogram on 12/12/15 showed an EF of 60-65%  (3) breast conserving surgery with consideration of  the [facility] trial to follow  (4) adjuvant radiation as appropriate  PLAN: The labs were reviewed in detail and were stable. Her LFTs have fortunately normalized. Terri Mayer is committed to receiving all 4 drugs today, despite the excessive dehydration, nausea, and diarrhea from the previous cycle. Recall that the plan was to switch docetaxel for gemcitabine, and remove pertuzumab from the regimen altogether. Dr. Jana Hakim was able to negotiate a half dose of the pertuzumab today. She will take her imodium and questran powder as directed. She is scheduled for IV fluids on both Friday and Saturday, with the option of canceling on Saturday if she is doing well.   Terri Mayer will return in 1 week for labs and a nadir visit with Dr. Jana Hakim. She understands and agrees with this plan. She knows the goal of treatment in her case is cure. She has been encouraged to call with any issues that might arise before her next visit here.   Laurie Panda, NP   02/19/2016 9:49 AM

## 2016-02-20 ENCOUNTER — Telehealth: Payer: Self-pay

## 2016-02-20 NOTE — Telephone Encounter (Signed)
-----   Message from Egbert Garibaldi, RN sent at 02/19/2016  4:35 PM EDT ----- Regarding: Dr. Jana Hakim, chemo f/u call Pt of Dr. Jana Hakim, first time Gemzar (treatment changed from Taxotere to Gemzar). Pt tolerated well.

## 2016-02-20 NOTE — Telephone Encounter (Signed)
Pt is doing "wonderful" after chemo yesterday. [facility] and is drinking, and has no nausea. She denies fever as well. Told her to call if any problems occur. She explained her post chemo antinausea regimen well.

## 2016-02-23 ENCOUNTER — Other Ambulatory Visit: Payer: Self-pay | Admitting: Medical Oncology

## 2016-02-23 ENCOUNTER — Ambulatory Visit (HOSPITAL_BASED_OUTPATIENT_CLINIC_OR_DEPARTMENT_OTHER): Payer: PPO

## 2016-02-23 VITALS — BP 136/61 | HR 88 | Temp 98.2°F | Resp 17

## 2016-02-23 DIAGNOSIS — R197 Diarrhea, unspecified: Secondary | ICD-10-CM | POA: Diagnosis not present

## 2016-02-23 DIAGNOSIS — C50211 Malignant neoplasm of upper-inner quadrant of right female breast: Secondary | ICD-10-CM | POA: Diagnosis not present

## 2016-02-23 MED ORDER — SODIUM CHLORIDE 0.9 % IV SOLN
Freq: Once | INTRAVENOUS | Status: AC
  Administered 2016-02-23: 14:00:00 via INTRAVENOUS

## 2016-02-23 MED ORDER — SODIUM CHLORIDE 0.9% FLUSH
10.0000 mL | INTRAVENOUS | Status: DC | PRN
  Administered 2016-02-23: 10 mL via INTRAVENOUS
  Filled 2016-02-23: qty 10

## 2016-02-23 MED ORDER — SODIUM CHLORIDE 0.9 % IV SOLN: INTRAVENOUS | Status: AC

## 2016-02-23 MED ORDER — HEPARIN SOD (PORK) LOCK FLUSH 100 UNIT/ML IV SOLN
500.0000 [IU] | Freq: Once | INTRAVENOUS | Status: AC
  Administered 2016-02-23: 500 [IU] via INTRAVENOUS
  Filled 2016-02-23: qty 5

## 2016-02-23 NOTE — Patient Instructions (Signed)
[facility] Discharge Instructions for Patients Receiving Chemotherapy  Today you received IV hydration.  To help prevent nausea and vomiting after your treatment, we encourage you to take your nausea medication as directed.   If you develop nausea and vomiting that is not controlled by your nausea medication, call the clinic.   BELOW ARE SYMPTOMS THAT SHOULD BE REPORTED IMMEDIATELY:  *FEVER GREATER THAN 100.5 F  *CHILLS WITH OR WITHOUT FEVER  NAUSEA AND VOMITING THAT IS NOT CONTROLLED WITH YOUR NAUSEA MEDICATION  *UNUSUAL SHORTNESS OF BREATH  *UNUSUAL BRUISING OR BLEEDING  TENDERNESS IN MOUTH AND THROAT WITH OR WITHOUT PRESENCE OF ULCERS  *URINARY PROBLEMS  *BOWEL PROBLEMS  UNUSUAL RASH Items with * indicate a potential emergency and should be followed up as soon as possible.  Feel free to call the clinic you have any questions or concerns. The clinic phone number is [phone].  Please show the Point at check-in to the Emergency Department and triage nurse.

## 2016-02-24 ENCOUNTER — Ambulatory Visit (HOSPITAL_BASED_OUTPATIENT_CLINIC_OR_DEPARTMENT_OTHER): Payer: PPO

## 2016-02-24 VITALS — BP 120/66 | HR 80 | Temp 98.7°F | Resp 18

## 2016-02-24 DIAGNOSIS — C50211 Malignant neoplasm of upper-inner quadrant of right female breast: Secondary | ICD-10-CM

## 2016-02-24 MED ORDER — SODIUM CHLORIDE 0.9 % IV SOLN
Freq: Once | INTRAVENOUS | Status: AC
  Administered 2016-02-24: 09:00:00 via INTRAVENOUS

## 2016-02-24 MED ORDER — SODIUM CHLORIDE 0.9 % IV SOLN
INTRAVENOUS | Status: AC
  Administered 2016-02-24: 09:00:00 via INTRAVENOUS

## 2016-02-26 ENCOUNTER — Ambulatory Visit (HOSPITAL_BASED_OUTPATIENT_CLINIC_OR_DEPARTMENT_OTHER): Payer: PPO | Admitting: Oncology

## 2016-02-26 ENCOUNTER — Telehealth: Payer: Self-pay | Admitting: Oncology

## 2016-02-26 ENCOUNTER — Other Ambulatory Visit (HOSPITAL_BASED_OUTPATIENT_CLINIC_OR_DEPARTMENT_OTHER): Payer: PPO

## 2016-02-26 VITALS — BP 101/48 | HR 104 | Temp 98.1°F | Resp 18 | Ht 66.0 in | Wt 219.3 lb

## 2016-02-26 DIAGNOSIS — C50211 Malignant neoplasm of upper-inner quadrant of right female breast: Secondary | ICD-10-CM | POA: Diagnosis not present

## 2016-02-26 DIAGNOSIS — R110 Nausea: Secondary | ICD-10-CM

## 2016-02-26 DIAGNOSIS — C50511 Malignant neoplasm of lower-outer quadrant of right female breast: Secondary | ICD-10-CM | POA: Diagnosis not present

## 2016-02-26 DIAGNOSIS — C773 Secondary and unspecified malignant neoplasm of axilla and upper limb lymph nodes: Secondary | ICD-10-CM

## 2016-02-26 LAB — CBC WITH DIFFERENTIAL/PLATELET
BASO%: 0.2 % (ref 0.0–2.0)
BASOS ABS: 0 10*3/uL (ref 0.0–0.1)
EOS%: 0.5 % (ref 0.0–7.0)
Eosinophils Absolute: 0.1 10*3/uL (ref 0.0–0.5)
HEMATOCRIT: 37.4 % (ref 34.8–46.6)
HGB: 12.3 g/dL (ref 11.6–15.9)
LYMPH#: 2.4 10*3/uL (ref 0.9–3.3)
LYMPH%: 24.3 % (ref 14.0–49.7)
MCH: 27.4 pg (ref 25.1–34.0)
MCHC: 33 g/dL (ref 31.5–36.0)
MCV: 83.1 fL (ref 79.5–101.0)
MONO#: 0.2 10*3/uL (ref 0.1–0.9)
MONO%: 2.3 % (ref 0.0–14.0)
NEUT#: 7.1 10*3/uL — ABNORMAL HIGH (ref 1.5–6.5)
NEUT%: 72.7 % (ref 38.4–76.8)
Platelets: 161 10*3/uL (ref 145–400)
RBC: 4.5 10*6/uL (ref 3.70–5.45)
RDW: 17.3 % — ABNORMAL HIGH (ref 11.2–14.5)
WBC: 9.8 10*3/uL (ref 3.9–10.3)

## 2016-02-26 LAB — COMPREHENSIVE METABOLIC PANEL
ALK PHOS: 106 U/L (ref 40–150)
ALT: 147 U/L — ABNORMAL HIGH (ref 0–55)
ANION GAP: 10 meq/L (ref 3–11)
AST: 61 U/L — AB (ref 5–34)
Albumin: 3.6 g/dL (ref 3.5–5.0)
BILIRUBIN TOTAL: 1.85 mg/dL — AB (ref 0.20–1.20)
BUN: 19.6 mg/dL (ref 7.0–26.0)
CALCIUM: 9.5 mg/dL (ref 8.4–10.4)
CO2: 28 mEq/L (ref 22–29)
CREATININE: 0.9 mg/dL (ref 0.6–1.1)
Chloride: 103 mEq/L (ref 98–109)
EGFR: 72 mL/min/{1.73_m2} — ABNORMAL LOW (ref >90)
GLUCOSE: 107 mg/dL (ref 70–140)
POTASSIUM: 3.8 meq/L (ref 3.5–5.1)
Sodium: 141 mEq/L (ref 136–145)
Total Protein: 6.4 g/dL (ref 6.4–8.3)

## 2016-02-26 NOTE — Progress Notes (Signed)
[facility]  [phone] [phone]   ID: KRYSTALLE PILKINGTON DOB: 1951/06/16  MR#: [id number]  UPJ#:[id number]  Patient Care Team: [name], MD as PCP - General (Internal Medicine) Chauncey Cruel, MD as Consulting Physician (Oncology) Rolm Bookbinder, MD as Consulting Physician (General Surgery) Thea Silversmith, MD as Consulting Physician (Radiation Oncology) Princess Bruins, MD as Consulting Physician (Obstetrics and Gynecology) Jolaine Artist, MD as Consulting Physician (Cardiology) PCP: [name], MD GYN: SU:  OTHER MD:  CHIEF COMPLAINT: right breast cancer  CURRENT TREATMENT: neoadjuvant chemotherapy  BREAST CANCER HISTORY: From the original intake note:  "Terri Mayer" underwent bilateral screening mammography with tomosynthesis at the breast center 11/02/2015 showing a possible mass in the right breast and low right axillary area. She was called back for right diagnostic mammography with tomosynthesis and right ultrasonography 11/14/2015. The breast density was category B. There was a lobulated mass in the lateral right breast, with a smaller oval mass immediately adjacent to it. Also in the upper outer quadrant there was a third mass. This has been stable back to 2014 and is felt to be a fibroadenoma.  Ultrasound of the right breast confirmed a hypoechoic mass at the 3:00 position 6 cm from the nipple, measuring 1.4 cm. Adjacent to this there was a 0.5 cm internal mammary lymph node, with normal morphology and normal hilar fat. The mass at the 10:00 position measured 0.8 cm and again was consistent with a fibroadenoma. In the right axilla there was an abnormal lymph node with no internal fat measuring 1.8 cm. The rest of the exam was unremarkable.  Biopsy of the right breast 8:30 o'clock lesion and the right axillary lymph node on 11/16/2015 showed ([id number]) both to be positive for invasive ductal carcinoma, grade 3, the  breast mass being estrogen receptor 10% positive with moderate staining intensity, but progesterone receptor negative, but the lymph node being estrogen and progesterone both negative. Both the breast and lymph nodes had MIB-1 of 30%. Both were positive for HER-2 amplification, the signals ratio is being 6.06 and 7.61, and the number per cell 10.00 and 11.80.  The patient's subsequent history is as detailed below   INTERVAL HISTORY: Terri Mayer returns for follow up of her HER-2 positive breast cancer, accompanied by a friend. Today is day 8, cycle 4 of 6 planned cycles of carboplatin, gemcitabine, trastuzumab and pertuzumab. We cut the pertuzumab dose in half with this fourth cycle because of significant problems with diarrhea before  REVIEW OF SYSTEMS: Terri Mayer did much better with the reduced dose of pertuzumab. Basically she had no diarrhea. She had her usual normal bowel movements which tend to be if anything on the constipated side. She did have nausea and vomiting 1 when she fourth herself to eat some protein when she was not hungry. She is working closely with our nutritionist on what to eat and 1. [facility], one "standard" class, and when walking and additional day. She walks inside to avoid sun sensitivity problems she still has numbness in her feet, but this does not cause her pain and does not involve balance or other issues. The peripheral neuropathy is no better but also no worse. A detailed review of systems today was otherwise stable  PAST MEDICAL HISTORY: Past Medical History  Diagnosis Date  . History of chicken pox   . Allergy   . Breast cancer ([place])   . Osteoarthritis  Neck,Back,Knees,Hips,Ankles    PAST SURGICAL HISTORY: Past Surgical History  Procedure Laterality Date  . Eye surgery  at age 18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  . Wisdom tooth extraction    . Dilation and curettage of uterus    . Portacath placement Right 12/11/2015     Procedure: INSERTION PORT-A-CATH WITH ULTRASOUND;  Surgeon: Rolm Bookbinder, MD;  Location: [facility];  Service: General;  Laterality: Right;    FAMILY HISTORY Family History  Problem Relation Age of Onset  . Heart disease Father   . Diabetes      GM  . Colon cancer Neg Hx   . Breast cancer Neg Hx   . Dementia      M  . Transient ischemic attack      F   the patient's father died after multiple strokes at the age of 8. The patient's mother died at the age of 52 with Alzheimer's disease. The patient had one brother, one sister. The patient's paternal grandfather died from lung cancer in his 93s. On the mother's side there are 2 (second) cousins with breast cancer diagnosed in their early 99s, and a more remote cousin diagnosed with breast cancer at age 26. There is no history of ovarian cancer.   GYNECOLOGIC HISTORY:  No LMP recorded. Patient is postmenopausal.  menarche age 23, the patient is GX P0 and she understands that not carrying a child to term before the age of 69 essentially doubled the risk of breast cancer. She stopped having periods between age 82 and 19 and did not take hormone replacement. 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 She lives by herself, with her pet dog. Her son, adopted at birth, Jadelyn Elks, currently 20, attends [employer] in the psychology Department.     ADVANCED DIRECTIVES: Not in place. At the 02/26/2016 clinic visit the patient was given the appropriate forms to complete and notarize at her discretion.    HEALTH MAINTENANCE: Social History  Substance Use Topics  . Smoking status: Never Smoker   . Smokeless tobacco: Never Used  . Alcohol Use: Yes     Comment: rarely      Colonoscopy: 12/28/2004  PAP:  Bone density:  Lipid panel:  Allergies  Allergen Reactions  . Codeine Nausea And  Vomiting    Current Outpatient Prescriptions  Medication Sig Dispense Refill  . cholestyramine (QUESTRAN) 4 g packet Take 4 g in slurry twice daily as needed for diarrhea (Patient not taking: Reported on 01/08/2016) 60 each 12  . clobetasol cream (TEMOVATE) 5.40 % Apply 1 application topically 2 (two) times daily. Reported on 02/19/2016  4  . dexamethasone (DECADRON) 4 MG tablet Take 2 tablets (8 mg total) by mouth 2 (two) times daily. Start the day before Taxotere. Then again the day after chemo for 3 days. 60 tablet 0  . IBUPROFEN PO Take by mouth as needed. Reported on 02/19/2016    . lidocaine-prilocaine (EMLA) cream Apply to affected area once 30 g 3  . LORazepam (ATIVAN) 0.5 MG tablet Take 1 tablet (0.5 mg total) by mouth at bedtime as needed (Nausea or vomiting). (Patient not taking: Reported on 02/05/2016) 30 tablet 0  . nystatin-triamcinolone ointment (MYCOLOG) Apply 1 application topically 2 (two) times daily. (Patient not taking: Reported on 01/15/2016) 90 g 6  . ondansetron (  ZOFRAN) 8 MG tablet Take 1 tablet (8 mg total) by mouth 2 (two) times daily. Start the day after chemo for 3 days. Then take as needed for nausea or vomiting. 30 tablet 1  . prochlorperazine (COMPAZINE) 10 MG tablet Take 1 tablet (10 mg total) by mouth every 6 (six) hours as needed (Nausea or vomiting). (Patient not taking: Reported on 02/19/2016) 30 tablet 1  . promethazine (PHENERGAN) 25 MG suppository Place 1 suppository (25 mg total) rectally every 6 (six) hours as needed for nausea or vomiting. (Patient not taking: Reported on 02/19/2016) 12 each 0   No current facility-administered medications for this visit.    OBJECTIVE: Middle-aged white woman In no acute distress Filed Vitals:   02/26/16 0814  BP: 101/48  Pulse: 104  Temp: 98.1 F (36.7 C)  Resp: 18     Body mass index is 35.41 kg/(m^2).    ECOG FS:1 - Symptomatic but completely ambulatory  Sclerae unicteric, EOMs intact Oropharynx clear, dentition in  good repair No cervical or supraclavicular adenopathy Lungs no rales or rhonchi Heart regular rate and rhythm Abd soft, nontender, positive bowel sounds MSK no focal spinal tenderness, no upper extremity lymphedema Neuro: nonfocal, well oriented, appropriate affect Breasts: I do not palpate a mass in the right breast (I was not able to palpate a mass at baseline either). The right axilla is benign.  LAB RESULTS:  CMP     Component Value Date/Time   NA 143 02/19/2016 0850   K 4.9 02/19/2016 0850   CO2 28 02/19/2016 0850   GLUCOSE 127 02/19/2016 0850   BUN 18.8 02/19/2016 0850   CREATININE 0.9 02/19/2016 0850   CALCIUM 9.9 02/19/2016 0850   PROT 6.5 02/19/2016 0850   ALBUMIN 3.4* 02/19/2016 0850   AST 20 02/19/2016 0850   ALT 27 02/19/2016 0850   ALKPHOS 54 02/19/2016 0850   BILITOT 1.08 02/19/2016 0850    INo results found for: SPEP, UPEP  Lab Results  Component Value Date   WBC 9.8 02/26/2016   NEUTROABS 7.1* 02/26/2016   HGB 12.3 02/26/2016   HCT 37.4 02/26/2016   MCV 83.1 02/26/2016   PLT 161 02/26/2016      Chemistry      Component Value Date/Time   NA 143 02/19/2016 0850   K 4.9 02/19/2016 0850   CO2 28 02/19/2016 0850   BUN 18.8 02/19/2016 0850   CREATININE 0.9 02/19/2016 0850      Component Value Date/Time   CALCIUM 9.9 02/19/2016 0850   ALKPHOS 54 02/19/2016 0850   AST 20 02/19/2016 0850   ALT 27 02/19/2016 0850   BILITOT 1.08 02/19/2016 0850       No results found for: LABCA2  No components found for: LABCA125  No results for input(s): INR in the last 168 hours.  Urinalysis    Component Value Date/Time   BILIRUBINUR neg 12/29/2015 1014   PROTEINUR pos 12/29/2015 1014   UROBILINOGEN negative 12/29/2015 1014   NITRITE neg 12/29/2015 1014   LEUKOCYTESUR Negative 12/29/2015 1014   STUDIES: No results found.   ASSESSMENT: 65 y.o. [place] woman status post right breast lower outer quadrant and right axillary lymph node biopsy  11/16/2015, also positive for a clinical  T1c pN1, stage IIA invasive ductal carcinoma grade 3, essentially estrogen and progesterone receptor negative but HER-2 amplified, with an MIB-1 of 30%  (a) the breast lesion had moderate positivity for the estrogen receptor at 10%; the axillary lymph node lesion was estrogen receptor  negative. Both were progesterone receptor negative  (1) neoadjuvant chemotherapy with carboplatin, docetaxel, trastuzumab and pertuzumab for 6 cycles, started 12/18/2015  (a) docetaxel switched to gemcitabine for cycles 4-6 because of progressive neuropathy  (b) pertuzumab dose cut in half with cycle 4 due to excessive diarrhea, resumed full dose cycle 5  (2) trastuzumab to be continued to total one year  (a) baseline echocardiogram on 12/12/15 showed an EF of 60-65%  (3) breast conserving surgery with consideration of the Alliance trial to follow  (4) adjuvant radiation as appropriate  PLAN: Terri Mayer did much better after cycle 4, with the pertuzumab dose reduced by half. However she is very concerned that she really wants to receive full dose pertuzumab. She tells me she has changed her diet and with that and the fluids she receives on days 5 and 6 she assures me she is going to do just fine with the full dose.  After much discussion and after twisting my arm we are going to go back to full dose pertuzumab with cycle 5. We are also going to do IV fluids on days 5 and 6 as before.  Today I reviewed how she should take her Imodium and Questran when diarrhea develops as it is likely to. With the second diarrheal bowel movements she is going to start Imodium. She will repeat Imodium after each diarrheal bowel movements up to 6 times a day. With the second diarrheal bowel movements she will also start Questran and she will take that twice daily for the next 3 days. She will also call us to tell us of things are going. I do think if she does all that it will be safe for her to receive  the full dose.  As far as her nausea is concerned, she is welcome to use Zofran as directed. She tolerates it well.  Her next dose will be on 03/11/2016. Before that treatment, on 02/28/2016, she has an appointment with cardio oncology and will have a second echocardiogram  She knows to call for any problems that may develop before her next visit. Chauncey Cruel, MD   02/26/2016 8:23 AM

## 2016-02-26 NOTE — Telephone Encounter (Signed)
appt made and avs printed °

## 2016-02-28 ENCOUNTER — Ambulatory Visit (HOSPITAL_COMMUNITY)
Admission: RE | Admit: 2016-02-28 | Discharge: 2016-02-28 | Disposition: A | Discharge status: Home / Self Care | Payer: PPO | Source: Ambulatory Visit | Attending: Internal Medicine | Admitting: Internal Medicine

## 2016-02-28 ENCOUNTER — Ambulatory Visit (HOSPITAL_BASED_OUTPATIENT_CLINIC_OR_DEPARTMENT_OTHER)
Admission: RE | Admit: 2016-02-28 | Discharge: 2016-02-28 | Disposition: A | Discharge status: Home / Self Care | Payer: PPO | Source: Ambulatory Visit | Attending: Internal Medicine | Admitting: Internal Medicine

## 2016-02-28 VITALS — BP 122/88 | HR 85 | Wt 219.2 lb

## 2016-02-28 DIAGNOSIS — C50211 Malignant neoplasm of upper-inner quadrant of right female breast: Secondary | ICD-10-CM | POA: Insufficient documentation

## 2016-02-28 DIAGNOSIS — I5189 Other ill-defined heart diseases: Secondary | ICD-10-CM | POA: Insufficient documentation

## 2016-02-28 DIAGNOSIS — I071 Rheumatic tricuspid insufficiency: Secondary | ICD-10-CM | POA: Insufficient documentation

## 2016-02-28 DIAGNOSIS — I517 Cardiomegaly: Secondary | ICD-10-CM | POA: Insufficient documentation

## 2016-02-28 DIAGNOSIS — I340 Nonrheumatic mitral (valve) insufficiency: Secondary | ICD-10-CM | POA: Diagnosis not present

## 2016-02-28 MED ORDER — CARVEDILOL 3.125 MG PO TABS: 3.1250 mg | ORAL_TABLET | Freq: Two times a day (BID) | ORAL | Status: DC

## 2016-02-28 MED FILL — CARVEDILOL 3.125 MG TABLET: 3.125 | 30 days supply | Qty: 60 | Fill #0

## 2016-02-28 NOTE — Progress Notes (Signed)
Advanced Heart Failure Medication Review by a Pharmacist  Does the patient  feel that his/her medications are working for him/her?  yes  Has the patient been experiencing any side effects to the medications prescribed?  no  Does the patient measure his/her own blood pressure or blood glucose at home?  no   Does the patient have any problems obtaining medications due to transportation or finances?   no  Understanding of regimen: excellent Understanding of indications: excellent Potential of compliance: excellent Patient understands to avoid NSAIDs. Patient understands to avoid decongestants.  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 She reports excellent compliance and did not have any specific medication-related questions or concerns for me at this time.   Ruta Hinds. Velva Harman, PharmD, BCPS, CPP Clinical Pharmacist Pager: [phone] Phone: [phone] 02/28/2016 11:59 AM      Time with patient: 8 minutes Preparation and documentation time: 2 minutes Total time: 10 minutes

## 2016-02-28 NOTE — Patient Instructions (Signed)
Start Carvedilol 3.125 mg Twice daily   Your physician recommends that you schedule a follow-up appointment in: 6 weeks with an echocardiogram

## 2016-02-28 NOTE — Progress Notes (Signed)
Patient ID: Terri Mayer, female   DOB: 1950/11/24, 65 y.o.   MRN: [id number]    Advanced Heart Failure/Cardio0oncology Note   Referring Physician: Dr Jana Hakim Primary Care: Dr. Bernerd Limbo Primary Cardiologist: New (Dr. 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 ER/PR- and HER-2-neu positive. Referred by Dr. Jana Hakim for enrollment into cardio-oncology clinic.   Breast CA profile: Biopsy of the right breast 8:30 o'clock lesion and the right axillary lymph node on 11/16/2015 showed ([id number]) both to be positive for invasive ductal carcinoma, grade 3, the breast mass being estrogen receptor 10% positive with moderate staining intensity, but progesterone receptor negative, but the lymph node being estrogen and progesterone both negative. Both the breast and lymph nodes had MIB-1 of 30%. Both were positive for HER-2 amplification, the signals ratio is being 6.06 and 7.61, and the number per cell 10.00 and 11.80.   She presents today for cardio-oncology follow up. Is in cycle 4 of 6. She has been doing well, despite some side effects from round 3 of chemo. Otherwise she has felt fine. Has been exercising less.   Echo 12/12/15 EF 65%, LS' 10.1, GLS -21.9%, Grade 2 DD Echo 02/28/16 EF 55%, LS' 9.2,   GLS -15.9%, Grade 1 DD, Mildly calcified AV   Past Medical History  Diagnosis Date  . History of chicken pox   . Allergy   . Breast cancer ([place])   . Osteoarthritis     Neck,Back,Knees,Hips,Ankles    Current Outpatient Prescriptions  Medication Sig Dispense Refill  . dexamethasone (DECADRON) 4 MG tablet Take 2 tablets (8 mg total) by mouth 2 (two) times daily. Start the day before Taxotere. Then again the day after chemo for 3 days. 60 tablet 0  . ibuprofen (ADVIL,MOTRIN) 200 MG tablet Take 200 mg by mouth every 6 (six) hours as needed for fever or  mild pain.    Marland Kitchen lidocaine-prilocaine (EMLA) cream Apply to affected area once 30 g 3  . LORazepam (ATIVAN) 0.5 MG tablet Take 0.5 mg by mouth daily.    . ondansetron (ZOFRAN) 8 MG tablet Take 1 tablet (8 mg total) by mouth 2 (two) times daily. Start the day after chemo for 3 days. Then take as needed for nausea or vomiting. 30 tablet 1  . promethazine (PHENERGAN) 25 MG suppository Place 25 mg rectally daily as needed. Nausea/vomiting  0  . cholestyramine (QUESTRAN) 4 g packet Take 4 g in slurry twice daily as needed for diarrhea (Patient not taking: Reported on 02/28/2016) 60 each 12  . clobetasol cream (TEMOVATE) 4.09 % Apply 1 application topically 2 (two) times daily. Reported on 02/28/2016  4   No current facility-administered medications for this encounter.    Allergies  Allergen Reactions  . Codeine Nausea And Vomiting      Social History   Social History  . Marital Status: Single    Spouse Name: N/A  . Number of Children: 1  . Years of Education: N/A   Occupational History  . dentist    Social History Main Topics  . Smoking status: Never Smoker   . Smokeless tobacco: Never Used  . Alcohol Use: Yes     Comment: rarely   . Drug Use: Not on file  . Sexual Activity: Not on file   Other Topics Concern  . Not on file   Social History Narrative  Single, adopted 1 chil      Family History  Problem Relation Age of Onset  . Heart disease Father   . Diabetes      GM  . Colon cancer Neg Hx   . Breast cancer Neg Hx   . Dementia      M  . Transient ischemic attack      F    Filed Vitals:   02/28/16 1141  BP: 122/88  Pulse: 85  Weight: 219 lb 4 oz (99.451 kg)  SpO2: 98%    PHYSICAL EXAM: General:  Well appearing. No respiratory difficulty HEENT: normal Neck: supple. JVD not elevated. Carotids 2+ bilat; no bruits. No thyromegaly or lymphadenopathy appreciated. Cor: PMI nondisplaced. RRR, No M/G/R appreciated. Port R chest.  Lungs: Clear Abdomen: Obese,  soft, NT, ND, no HSM. No bruits or masses. +BS  Extremities: no cyanosis, clubbing, rash. Trace ankle edema.  Neuro: alert & oriented x 3, cranial nerves grossly intact. moves all 4 extremities w/o difficulty. Affect pleasant.   ASSESSMENT & PLAN:   1. Female breast cancer of the right upper-inner quadrant - Has completed 4 of 6 cycles of Carboplatin + Docetaxel + trastuzumab and pertuzamab over same time period, Will then finish year course of Herceptin. - Breast conserving surgery to follow. (Consideration for Alliance trial per Dr Jana Hakim note) - Echo today EF 55%, LS' 9.2,   GLS -15.9%, Grade 1 DD, Mildly calcified AV. Parameters slightly worse.  - Will start coreg 3.125 mg BID.  2. Peripheral edema - Encouraged to limit sodium and fluid intake - Continue 20 mg lasix as needed for worsening edema.   EF slightly down but remains in normal range.  Will have her finish 6/6 rounds and re-Echo. (Apr 25, May 15)   Will follow up in 6 weeks. (Last week of May, first week of June) for re-echo.   Shirley Friar, PA-C 02/28/2016   Patient seen and examined with Oda Kilts, PA-C. We discussed all aspects of the encounter. I agree with the assessment and plan as stated above.   Echo reviewed personally I am suspicious for very mild cardiotoxicity with Herceptin/perjeta. That said, EF still in normal range. Start carvedilol. Will see back after next 2 rounds of Herceptin/perjeta and repeat echo. May need scheduled interruption.   Jordi Kamm,MD 6:14 PM

## 2016-02-28 NOTE — Progress Notes (Signed)
  Echocardiogram 2D Echocardiogram has been performed.  Jennette Dubin 02/28/2016, 11:56 AM

## 2016-03-08 ENCOUNTER — Other Ambulatory Visit: Payer: Self-pay | Admitting: Oncology

## 2016-03-11 ENCOUNTER — Ambulatory Visit: Payer: PPO | Admitting: Nutrition

## 2016-03-11 ENCOUNTER — Encounter: Payer: Self-pay | Admitting: Nurse Practitioner

## 2016-03-11 ENCOUNTER — Encounter: Payer: PPO | Admitting: Nutrition

## 2016-03-11 ENCOUNTER — Telehealth: Payer: Self-pay | Admitting: Nurse Practitioner

## 2016-03-11 ENCOUNTER — Encounter: Payer: Self-pay | Admitting: *Deleted

## 2016-03-11 ENCOUNTER — Ambulatory Visit (HOSPITAL_BASED_OUTPATIENT_CLINIC_OR_DEPARTMENT_OTHER): Payer: PPO | Admitting: Nurse Practitioner

## 2016-03-11 ENCOUNTER — Other Ambulatory Visit: Payer: Self-pay | Admitting: Oncology

## 2016-03-11 ENCOUNTER — Other Ambulatory Visit (HOSPITAL_BASED_OUTPATIENT_CLINIC_OR_DEPARTMENT_OTHER): Payer: PPO

## 2016-03-11 ENCOUNTER — Ambulatory Visit (HOSPITAL_BASED_OUTPATIENT_CLINIC_OR_DEPARTMENT_OTHER): Payer: PPO

## 2016-03-11 VITALS — BP 122/62 | HR 78 | Temp 98.7°F | Resp 18 | Ht 66.0 in | Wt 221.6 lb

## 2016-03-11 DIAGNOSIS — C50211 Malignant neoplasm of upper-inner quadrant of right female breast: Secondary | ICD-10-CM

## 2016-03-11 DIAGNOSIS — Z5112 Encounter for antineoplastic immunotherapy: Secondary | ICD-10-CM | POA: Diagnosis not present

## 2016-03-11 DIAGNOSIS — C50511 Malignant neoplasm of lower-outer quadrant of right female breast: Secondary | ICD-10-CM | POA: Diagnosis not present

## 2016-03-11 DIAGNOSIS — Z5111 Encounter for antineoplastic chemotherapy: Secondary | ICD-10-CM

## 2016-03-11 DIAGNOSIS — C773 Secondary and unspecified malignant neoplasm of axilla and upper limb lymph nodes: Secondary | ICD-10-CM

## 2016-03-11 LAB — CBC WITH DIFFERENTIAL/PLATELET
BASO%: 0.3 % (ref 0.0–2.0)
BASOS ABS: 0 10*3/uL (ref 0.0–0.1)
EOS ABS: 0 10*3/uL (ref 0.0–0.5)
EOS%: 0.7 % (ref 0.0–7.0)
HEMATOCRIT: 34.2 % — AB (ref 34.8–46.6)
HEMOGLOBIN: 11.2 g/dL — AB (ref 11.6–15.9)
LYMPH#: 1.7 10*3/uL (ref 0.9–3.3)
LYMPH%: 28.5 % (ref 14.0–49.7)
MCH: 28.6 pg (ref 25.1–34.0)
MCHC: 32.7 g/dL (ref 31.5–36.0)
MCV: 87.2 fL (ref 79.5–101.0)
MONO#: 0.7 10*3/uL (ref 0.1–0.9)
MONO%: 11.4 % (ref 0.0–14.0)
NEUT#: 3.6 10*3/uL (ref 1.5–6.5)
NEUT%: 59.1 % (ref 38.4–76.8)
Platelets: 405 10*3/uL — ABNORMAL HIGH (ref 145–400)
RBC: 3.92 10*6/uL (ref 3.70–5.45)
RDW: 18.5 % — AB (ref 11.2–14.5)
WBC: 6 10*3/uL (ref 3.9–10.3)

## 2016-03-11 LAB — COMPREHENSIVE METABOLIC PANEL
ALBUMIN: 3.5 g/dL (ref 3.5–5.0)
ALT: 30 U/L (ref 0–55)
AST: 27 U/L (ref 5–34)
Alkaline Phosphatase: 65 U/L (ref 40–150)
Anion Gap: 9 mEq/L (ref 3–11)
BILIRUBIN TOTAL: 0.88 mg/dL (ref 0.20–1.20)
BUN: 15.6 mg/dL (ref 7.0–26.0)
CALCIUM: 9.3 mg/dL (ref 8.4–10.4)
CO2: 27 mEq/L (ref 22–29)
Chloride: 107 mEq/L (ref 98–109)
Creatinine: 0.8 mg/dL (ref 0.6–1.1)
EGFR: 78 mL/min/{1.73_m2} — AB (ref >90)
GLUCOSE: 97 mg/dL (ref 70–140)
POTASSIUM: 4.4 meq/L (ref 3.5–5.1)
Sodium: 143 mEq/L (ref 136–145)
Total Protein: 6.2 g/dL — ABNORMAL LOW (ref 6.4–8.3)

## 2016-03-11 MED ORDER — TRASTUZUMAB CHEMO INJECTION 440 MG
6.0000 mg/kg | Freq: Once | INTRAVENOUS | Status: AC
  Administered 2016-03-11: 609 mg via INTRAVENOUS
  Filled 2016-03-11: qty 29

## 2016-03-11 MED ORDER — ACETAMINOPHEN 325 MG PO TABS
ORAL_TABLET | ORAL | Status: AC
  Filled 2016-03-11: qty 2

## 2016-03-11 MED ORDER — SODIUM CHLORIDE 0.9 % IV SOLN
570.0000 mg | Freq: Once | INTRAVENOUS | Status: AC
  Administered 2016-03-11: 570 mg via INTRAVENOUS
  Filled 2016-03-11: qty 57

## 2016-03-11 MED ORDER — SODIUM CHLORIDE 0.9 % IV SOLN
Freq: Once | INTRAVENOUS | Status: AC
  Administered 2016-03-11: 10:00:00 via INTRAVENOUS
  Filled 2016-03-11: qty 2

## 2016-03-11 MED ORDER — PEGFILGRASTIM 6 MG/0.6ML ~~LOC~~ PSKT
6.0000 mg | PREFILLED_SYRINGE | Freq: Once | SUBCUTANEOUS | Status: AC
  Administered 2016-03-11: 6 mg via SUBCUTANEOUS
  Filled 2016-03-11: qty 0.6

## 2016-03-11 MED ORDER — SODIUM CHLORIDE 0.9 % IV SOLN
800.0000 mg/m2 | Freq: Once | INTRAVENOUS | Status: AC
  Administered 2016-03-11: 1862 mg via INTRAVENOUS
  Filled 2016-03-11: qty 48.97

## 2016-03-11 MED ORDER — ACETAMINOPHEN 325 MG PO TABS
650.0000 mg | ORAL_TABLET | Freq: Once | ORAL | Status: AC
  Administered 2016-03-11: 650 mg via ORAL

## 2016-03-11 MED ORDER — PALONOSETRON HCL INJECTION 0.25 MG/5ML
INTRAVENOUS | Status: AC
  Filled 2016-03-11: qty 5

## 2016-03-11 MED ORDER — HEPARIN SOD (PORK) LOCK FLUSH 100 UNIT/ML IV SOLN
500.0000 [IU] | Freq: Once | INTRAVENOUS | Status: AC | PRN
  Administered 2016-03-11: 500 [IU]
  Filled 2016-03-11: qty 5

## 2016-03-11 MED ORDER — SODIUM CHLORIDE 0.9 % IJ SOLN
10.0000 mL | INTRAMUSCULAR | Status: DC | PRN
  Administered 2016-03-11: 10 mL
  Filled 2016-03-11: qty 10

## 2016-03-11 MED ORDER — SODIUM CHLORIDE 0.9 % IV SOLN
Freq: Once | INTRAVENOUS | Status: AC
  Administered 2016-03-11: 10:00:00 via INTRAVENOUS

## 2016-03-11 MED ORDER — DIPHENHYDRAMINE HCL 25 MG PO CAPS
50.0000 mg | ORAL_CAPSULE | Freq: Once | ORAL | Status: AC
  Administered 2016-03-11: 50 mg via ORAL

## 2016-03-11 MED ORDER — SODIUM CHLORIDE 0.9 % IV SOLN
420.0000 mg | Freq: Once | INTRAVENOUS | Status: AC
  Administered 2016-03-11: 420 mg via INTRAVENOUS
  Filled 2016-03-11: qty 14

## 2016-03-11 MED ORDER — PALONOSETRON HCL INJECTION 0.25 MG/5ML
0.2500 mg | Freq: Once | INTRAVENOUS | Status: AC
  Administered 2016-03-11: 0.25 mg via INTRAVENOUS

## 2016-03-11 MED ORDER — DIPHENHYDRAMINE HCL 25 MG PO CAPS
ORAL_CAPSULE | ORAL | Status: AC
  Filled 2016-03-11: qty 2

## 2016-03-11 NOTE — Progress Notes (Signed)
Nutrition follow-up completed with patient diagnosed with breast cancer. Patient reports she has been successful in changing her diet. She is keeping a journal on the foods that she eats She continues to drink ensure and Carnation breakfast essentials. Weight decreased and documented as 221.6 pounds on April 24 decreased from 225 pounds. Patient admits to some diarrhea and nausea as result of chemotherapy.  Nutrition diagnosis: Unintended weight loss continues.  Intervention:  Educated patient to consume plant-based diet with adequate protein to promote maintenance of lean body mass. Educated patient on strategies for eating if she develops diarrhea and nausea. Reviewed food journal and provided feedback to patient on food choices. Questions were answered.  Teach back method was used.  Monitoring, evaluation, goals:  Patient will tolerate healthy plant-based diet with adequate calories and protein to promote maintenance of lean body mass.  Next visit: Monday, May 15, during infusion.  **Disclaimer: This note was dictated with voice recognition software. Similar sounding words can inadvertently be transcribed and this note may contain transcription errors which may not have been corrected upon publication of note.**

## 2016-03-11 NOTE — Telephone Encounter (Signed)
lvm to inform pt of [facility] appt with Dr Donne Hazel 5/23 at 330 pm

## 2016-03-11 NOTE — Progress Notes (Signed)
[facility]  [phone] Fax:(336) [phone]   ID: SHANIE MAUZY DOB: [birth year]  MR#: [id number]  QIH#:[id number]  Patient Care Team: [name], MD as PCP - General (Internal Medicine) Chauncey Cruel, MD as Consulting Physician (Oncology) Rolm Bookbinder, MD as Consulting Physician (General Surgery) Thea Silversmith, MD as Consulting Physician (Radiation Oncology) Princess Bruins, MD as Consulting Physician (Obstetrics and Gynecology) Jolaine Artist, MD as Consulting Physician (Cardiology) PCP: [name], MD GYN: SU:  OTHER MD:  CHIEF COMPLAINT: right breast cancer  CURRENT TREATMENT: neoadjuvant chemotherapy  BREAST CANCER HISTORY: From the original intake note:  "Terri Mayer" underwent bilateral screening mammography with tomosynthesis at the breast center 11/02/2015 showing a possible mass in the right breast and low right axillary area. She was called back for right diagnostic mammography with tomosynthesis and right ultrasonography 11/14/2015. The breast density was category B. There was a lobulated mass in the lateral right breast, with a smaller oval mass immediately adjacent to it. Also in the upper outer quadrant there was a third mass. This has been stable back to 2014 and is felt to be a fibroadenoma.  Ultrasound of the right breast confirmed a hypoechoic mass at the 3:00 position 6 cm from the nipple, measuring 1.4 cm. Adjacent to this there was a 0.5 cm internal mammary lymph node, with normal morphology and normal hilar fat. The mass at the 10:00 position measured 0.8 cm and again was consistent with a fibroadenoma. In the right axilla there was an abnormal lymph node with no internal fat measuring 1.8 cm. The rest of the exam was unremarkable.  Biopsy of the right breast 8:30 o'clock lesion and the right axillary lymph node on 11/16/2015 showed ([id number]) both to be positive for invasive ductal carcinoma, grade 3, the  breast mass being estrogen receptor 10% positive with moderate staining intensity, but progesterone receptor negative, but the lymph node being estrogen and progesterone both negative. Both the breast and lymph nodes had MIB-1 of 30%. Both were positive for HER-2 amplification, the signals ratio is being 6.06 and 7.61, and the number per cell 10.00 and 11.80.  The patient's subsequent history is as detailed below   INTERVAL HISTORY: Terri Mayer returns for follow up of her HER-2 positive breast cancer, accompanied by a friend. Today is day 1, cycle 5 of 6 planned cycles of carboplatin, gemcitabine, trastuzumab and pertuzumab. We cut the pertuzumab dose in half with this fourth cycle because of significant problems with diarrhea before  REVIEW OF SYSTEMS: Terri Mayer denies fevers or chills. Her nausea is managed with zofran alone. She thinks the compazine was causing a rash so she stopped that. She is mildly constipated moving her bowels every other day. Her appetite is fair. She is supplementing with Carnation Breakfast and Ensure shakes. She denies mouth sores. She has some mild tingling to her fingertips and intermittent numbness to her toes. She was recently started on coreg by Dr. Haroldine Laws and this makes her dizzy. A detailed review of systems is otherwise stable.  PAST MEDICAL HISTORY: Past Medical History  Diagnosis Date  . History of chicken pox   . Allergy   . Breast cancer ([facility])   . Osteoarthritis     Neck,Back,Knees,Hips,Ankles    PAST SURGICAL HISTORY: Past Surgical History  Procedure Laterality Date  . Eye surgery  at age 22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  . Wisdom tooth extraction    . Dilation and curettage of uterus    .  Portacath placement Right 12/11/2015    Procedure: INSERTION PORT-A-CATH WITH ULTRASOUND;  Surgeon: Rolm Bookbinder, MD;  Location: [facility];  Service: General;  Laterality: Right;    FAMILY HISTORY Family History  Problem Relation Age of Onset  . Heart  disease Father   . Diabetes      GM  . Colon cancer Neg Hx   . Breast cancer Neg Hx   . Dementia      M  . Transient ischemic attack      F   the patient's father died after multiple strokes at the age of 29. The patient's mother died at the age of 74 with Alzheimer's disease. The patient had one brother, one sister. The patient's paternal grandfather died from lung cancer in his 44s. On the mother's side there are 2 (second) cousins with breast cancer diagnosed in their early 70s, and a more remote cousin diagnosed with breast cancer at age 66. There is no history of ovarian cancer.   GYNECOLOGIC HISTORY:  No LMP recorded. Patient is postmenopausal.  menarche age 35, the patient is GX P0 and she understands that not carrying a child to term before the age of 50 essentially doubled the risk of breast cancer. She stopped having periods between age 37 and 89 and did not take hormone replacement. 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 She lives by herself, with her pet dog. Her son, adopted at birth, Sophi Calligan, currently 20, attends [employer] in the psychology Department.     ADVANCED DIRECTIVES: Not in place. At the 03/11/2016 clinic visit the patient was given the appropriate forms to complete and notarize at her discretion.    HEALTH MAINTENANCE: Social History  Substance Use Topics  . Smoking status: Never Smoker   . Smokeless tobacco: Never Used  . Alcohol Use: Yes     Comment: rarely      Colonoscopy: 12/28/2004  PAP:  Bone density:  Lipid panel:  Allergies  Allergen Reactions  . Codeine Nausea And Vomiting    Current Outpatient Prescriptions  Medication Sig Dispense Refill  . clobetasol cream (TEMOVATE) 6.21 % Apply 1 application topically 2 (two) times daily. Reported on 02/28/2016  4  . dexamethasone (DECADRON) 4 MG tablet Take 2  tablets (8 mg total) by mouth 2 (two) times daily. Start the day before Taxotere. Then again the day after chemo for 3 days. 60 tablet 0  . ibuprofen (ADVIL,MOTRIN) 200 MG tablet Take 200 mg by mouth every 6 (six) hours as needed for fever or mild pain.    Marland Kitchen lidocaine-prilocaine (EMLA) cream Apply to affected area once 30 g 3  . LORazepam (ATIVAN) 0.5 MG tablet Take 0.5 mg by mouth daily.    . ondansetron (ZOFRAN) 8 MG tablet Take 1 tablet (8 mg total) by mouth 2 (two) times daily. Start the day after chemo for 3 days. Then take as needed for nausea or vomiting. 30 tablet 1  . carvedilol (COREG) 3.125 MG tablet Take 1 tablet (3.125 mg total) by mouth 2 (two) times daily. (Patient not taking: Reported on 03/11/2016) 60 tablet 3  . cholestyramine (QUESTRAN) 4 g packet Take 4 g in slurry twice daily as needed for diarrhea (Patient not taking: Reported on 02/28/2016) 60 each 12  . promethazine (PHENERGAN) 25 MG suppository Place 25 mg  rectally daily as needed. Reported on 03/11/2016  0   No current facility-administered medications for this visit.    OBJECTIVE: Middle-aged white woman In no acute distress Filed Vitals:   03/11/16 0835  BP: 122/62  Pulse: 78  Temp: 98.7 F (37.1 C)  Resp: 18     Body mass index is 35.78 kg/(m^2).    ECOG FS:1 - Symptomatic but completely ambulatory   Skin: warm, dry  HEENT: sclerae anicteric, conjunctivae pink, oropharynx clear. No thrush or mucositis.  Lymph Nodes: No cervical or supraclavicular lymphadenopathy  Lungs: clear to auscultation bilaterally, no rales, wheezes, or rhonci  Heart: regular rate and rhythm  Abdomen: round, soft, non tender, positive bowel sounds  Musculoskeletal: No focal spinal tenderness, no peripheral edema  Neuro: non focal, well oriented, positive affect  Breasts: deferred   LAB RESULTS:  CMP     Component Value Date/Time   NA 143 03/11/2016 0819   K 4.4 03/11/2016 0819   CO2 27 03/11/2016 0819   GLUCOSE 97 03/11/2016  0819   BUN 15.6 03/11/2016 0819   CREATININE 0.8 03/11/2016 0819   CALCIUM 9.3 03/11/2016 0819   PROT 6.2* 03/11/2016 0819   ALBUMIN 3.5 03/11/2016 0819   AST 27 03/11/2016 0819   ALT 30 03/11/2016 0819   ALKPHOS 65 03/11/2016 0819   BILITOT 0.88 03/11/2016 0819    INo results found for: SPEP, UPEP  Lab Results  Component Value Date   WBC 6.0 03/11/2016   NEUTROABS 3.6 03/11/2016   HGB 11.2* 03/11/2016   HCT 34.2* 03/11/2016   MCV 87.2 03/11/2016   PLT 405* 03/11/2016      Chemistry      Component Value Date/Time   NA 143 03/11/2016 0819   K 4.4 03/11/2016 0819   CO2 27 03/11/2016 0819   BUN 15.6 03/11/2016 0819   CREATININE 0.8 03/11/2016 0819      Component Value Date/Time   CALCIUM 9.3 03/11/2016 0819   ALKPHOS 65 03/11/2016 0819   AST 27 03/11/2016 0819   ALT 30 03/11/2016 0819   BILITOT 0.88 03/11/2016 0819       No results found for: LABCA2  No components found for: [id number]  No results for input(s): INR in the last 168 hours.  Urinalysis    Component Value Date/Time   BILIRUBINUR neg 12/29/2015 1014   PROTEINUR pos 12/29/2015 1014   UROBILINOGEN negative 12/29/2015 1014   NITRITE neg 12/29/2015 1014   LEUKOCYTESUR Negative 12/29/2015 1014   STUDIES: No results found.   ASSESSMENT: 65 y.o.  woman status post right breast lower outer quadrant and right axillary lymph node biopsy 11/16/2015, also positive for a clinical  T1c pN1, stage IIA invasive ductal carcinoma grade 3, essentially estrogen and progesterone receptor negative but HER-2 amplified, with an MIB-1 of 30%  (a) the breast lesion had moderate positivity for the estrogen receptor at 10%; the axillary lymph node lesion was estrogen receptor negative. Both were progesterone receptor negative  (1) neoadjuvant chemotherapy with carboplatin, docetaxel, trastuzumab and pertuzumab for 6 cycles, started 12/18/2015  (a) docetaxel switched to gemcitabine for cycles 4-6 because of  progressive neuropathy  (b) pertuzumab dose cut in half with cycle 4 due to excessive diarrhea, resumed full dose cycle 5  (2) trastuzumab to be continued to total one year  (a) baseline echocardiogram on 12/12/15 showed an EF of 60-65%  (3) breast conserving surgery with consideration of the Alliance trial to follow  (4) adjuvant radiation as appropriate  PLAN: Terri Mayer is doing well today. She has all of her side effects under control at present. The labs were reviewed in detail and were entirely stable. She will proceed with cycle 5 of treatment today, with full dose pertuzumab per her request. The carboplatin dose is going to be adjusted for her weight loss. She will return on Friday and Saturday for IV fluids.  I have placed orders for a final breast MRI and consultation visit with Dr. Donne Hazel to be performed after cycle 6.   Terri Mayer will return in 1 week for labs and a nadir visit. She understands and agrees with this plan. She knows the goal of treatment in her case is cure. She has been encouraged to call with any issues that might arise before her next visit here.   Laurie Panda, NP   03/11/2016 9:25 AM

## 2016-03-11 NOTE — Patient Instructions (Signed)
[facility] Discharge Instructions for Patients Receiving Chemotherapy  Today you received the following chemotherapy agents:  Herceptin, Perjeta, Gemzar, Carboplatin  To help prevent nausea and vomiting after your treatment, we encourage you to take your nausea medication as prescribed.   If you develop nausea and vomiting that is not controlled by your nausea medication, call the clinic.   BELOW ARE SYMPTOMS THAT SHOULD BE REPORTED IMMEDIATELY:  *FEVER GREATER THAN 100.5 F  *CHILLS WITH OR WITHOUT FEVER  NAUSEA AND VOMITING THAT IS NOT CONTROLLED WITH YOUR NAUSEA MEDICATION  *UNUSUAL SHORTNESS OF BREATH  *UNUSUAL BRUISING OR BLEEDING  TENDERNESS IN MOUTH AND THROAT WITH OR WITHOUT PRESENCE OF ULCERS  *URINARY PROBLEMS  *BOWEL PROBLEMS  UNUSUAL RASH Items with * indicate a potential emergency and should be followed up as soon as possible.  Feel free to call the clinic you have any questions or concerns. The clinic phone number is [phone].  Please show the [facility] at check-in to the Emergency Department and triage nurse.

## 2016-03-13 ENCOUNTER — Other Ambulatory Visit: Payer: Self-pay | Admitting: *Deleted

## 2016-03-13 ENCOUNTER — Other Ambulatory Visit: Payer: Self-pay | Admitting: Nurse Practitioner

## 2016-03-13 DIAGNOSIS — C50211 Malignant neoplasm of upper-inner quadrant of right female breast: Secondary | ICD-10-CM

## 2016-03-15 ENCOUNTER — Ambulatory Visit (HOSPITAL_BASED_OUTPATIENT_CLINIC_OR_DEPARTMENT_OTHER): Payer: PPO

## 2016-03-15 VITALS — BP 95/83 | HR 80 | Temp 99.0°F | Resp 18

## 2016-03-15 DIAGNOSIS — E860 Dehydration: Secondary | ICD-10-CM

## 2016-03-15 DIAGNOSIS — C50211 Malignant neoplasm of upper-inner quadrant of right female breast: Secondary | ICD-10-CM

## 2016-03-15 MED ORDER — SODIUM CHLORIDE 0.9 % IV SOLN
Freq: Once | INTRAVENOUS | Status: AC
  Administered 2016-03-15: 10:00:00 via INTRAVENOUS

## 2016-03-15 MED ORDER — ONDANSETRON HCL 40 MG/20ML IJ SOLN
8.0000 mg | Freq: Once | INTRAMUSCULAR | Status: AC
  Administered 2016-03-15: 8 mg via INTRAVENOUS
  Filled 2016-03-15: qty 4

## 2016-03-15 MED ORDER — SODIUM CHLORIDE 0.9% FLUSH
10.0000 mL | Freq: Once | INTRAVENOUS | Status: AC
Start: 2016-03-15 — End: 2016-03-15
  Administered 2016-03-15: 10 mL via INTRAVENOUS
  Filled 2016-03-15: qty 10

## 2016-03-15 MED ORDER — HEPARIN SOD (PORK) LOCK FLUSH 100 UNIT/ML IV SOLN
500.0000 [IU] | Freq: Once | INTRAVENOUS | Status: AC
  Administered 2016-03-15: 500 [IU] via INTRAVENOUS
  Filled 2016-03-15: qty 5

## 2016-03-15 NOTE — Patient Instructions (Signed)

## 2016-03-16 ENCOUNTER — Ambulatory Visit (HOSPITAL_BASED_OUTPATIENT_CLINIC_OR_DEPARTMENT_OTHER): Payer: PPO

## 2016-03-16 VITALS — BP 108/64 | HR 83 | Temp 99.3°F | Resp 16

## 2016-03-16 DIAGNOSIS — C50211 Malignant neoplasm of upper-inner quadrant of right female breast: Secondary | ICD-10-CM

## 2016-03-16 MED ORDER — SODIUM CHLORIDE 0.9 % IV SOLN
Freq: Once | INTRAVENOUS | Status: AC
  Administered 2016-03-16: 09:00:00 via INTRAVENOUS

## 2016-03-16 MED ORDER — SODIUM CHLORIDE 0.9% FLUSH
10.0000 mL | Freq: Once | INTRAVENOUS | Status: AC
  Administered 2016-03-16: 10 mL via INTRAVENOUS
  Filled 2016-03-16: qty 10

## 2016-03-16 MED ORDER — SODIUM CHLORIDE 0.9 % IV SOLN
8.0000 mg | Freq: Once | INTRAVENOUS | Status: AC
  Administered 2016-03-16: 8 mg via INTRAVENOUS

## 2016-03-16 MED ORDER — HEPARIN SOD (PORK) LOCK FLUSH 100 UNIT/ML IV SOLN
500.0000 [IU] | Freq: Once | INTRAVENOUS | Status: AC
Start: 2016-03-16 — End: 2016-03-16
  Administered 2016-03-16: 500 [IU] via INTRAVENOUS
  Filled 2016-03-16: qty 5

## 2016-03-16 MED ORDER — SODIUM CHLORIDE 0.9% FLUSH
10.0000 mL | INTRAVENOUS | Status: DC | PRN
  Administered 2016-03-16: 10 mL via INTRAVENOUS
  Filled 2016-03-16: qty 10

## 2016-03-16 NOTE — Patient Instructions (Signed)

## 2016-03-17 NOTE — Progress Notes (Signed)
[facility]  Telephone:(336) [phone] Fax:(336) [phone]   ID: OZELL FERRERA DOB: 1951/06/13  MR#: [id number]  YOV#:[id number]  Patient Care Team: [name], MD as PCP - General (Internal Medicine) Chauncey Cruel, MD as Consulting Physician (Oncology) Rolm Bookbinder, MD as Consulting Physician (General Surgery) Thea Silversmith, MD as Consulting Physician (Radiation Oncology) Princess Bruins, MD as Consulting Physician (Obstetrics and Gynecology) Jolaine Artist, MD as Consulting Physician (Cardiology) PCP: [name], MD GYN: SU:  OTHER MD:  CHIEF COMPLAINT: right breast cancer  CURRENT TREATMENT: neoadjuvant chemotherapy  BREAST CANCER HISTORY: From the original intake note:  "Terri Mayer" underwent bilateral screening mammography with tomosynthesis at the breast center 11/02/2015 showing a possible mass in the right breast and low right axillary area. She was called back for right diagnostic mammography with tomosynthesis and right ultrasonography 11/14/2015. The breast density was category B. There was a lobulated mass in the lateral right breast, with a smaller oval mass immediately adjacent to it. Also in the upper outer quadrant there was a third mass. This has been stable back to 2014 and is felt to be a fibroadenoma.  Ultrasound of the right breast confirmed a hypoechoic mass at the 3:00 position 6 cm from the nipple, measuring 1.4 cm. Adjacent to this there was a 0.5 cm internal mammary lymph node, with normal morphology and normal hilar fat. The mass at the 10:00 position measured 0.8 cm and again was consistent with a fibroadenoma. In the right axilla there was an abnormal lymph node with no internal fat measuring 1.8 cm. The rest of the exam was unremarkable.  Biopsy of the right breast 8:30 o'clock lesion and the right axillary lymph node on 11/16/2015 showed ([id number]) both to be positive for invasive ductal carcinoma, grade 3, the  breast mass being estrogen receptor 10% positive with moderate staining intensity, but progesterone receptor negative, but the lymph node being estrogen and progesterone both negative. Both the breast and lymph nodes had MIB-1 of 30%. Both were positive for HER-2 amplification, the signals ratio is being 6.06 and 7.61, and the number per cell 10.00 and 11.80.  The patient's subsequent history is as detailed below   INTERVAL HISTORY: Terri Mayer returns for follow up of her breast cancer accompanied by a friend. Today is day 8, cycle 5 of 6 planned cycles of carboplatin, gemcitabine, trastuzumab and pertuzumab. Because of neuropathy after 3 cycles including docetaxel she was switched from that drug to gemcitabine. Because of diarrhea and the dose of pertuzumab was decreased cycle 4, but she has been very assiduoud at prophylaxing  for that problem and accordingly received the full dose of pertuzumab with cycle 5.  REVIEW OF SYSTEMS: Terri Mayer was not aware that she had a fever until that was checked here. However she has been feeling bad for at least 2 days. Her friend tells me after the last cycle she had 4 days when she felt nauseated and didn't eat or drink very much at all. She became dehydrated and then needed IV fluids. The same thing she says has happened in the last 2 days. The patient denies unusual headaches, visual changes, nausea, vomiting, dizziness, gait imbalance, mouth sores, pain on swallowing or sore throat, cough, phlegm production, pleurisy, shortness of breath, or change in bowel or bladder habits.  She has a little bit of irritation around her right labium.There has been no rash that she is aware of. A detailed review of systems today was otherwise stable  PAST  MEDICAL HISTORY: Past Medical History  Diagnosis Date  . History of chicken pox   . Allergy   . Breast cancer ([facility])   . Osteoarthritis     Neck,Back,Knees,Hips,Ankles    PAST SURGICAL HISTORY: Past Surgical History  Procedure  Laterality Date  . Eye surgery  at age 36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  . Wisdom tooth extraction    . Dilation and curettage of uterus    . Portacath placement Right 12/11/2015    Procedure: INSERTION PORT-A-CATH WITH ULTRASOUND;  Surgeon: Rolm Bookbinder, MD;  Location: [facility];  Service: General;  Laterality: Right;    FAMILY HISTORY Family History  Problem Relation Age of Onset  . Heart disease Father   . Diabetes      GM  . Colon cancer Neg Hx   . Breast cancer Neg Hx   . Dementia      M  . Transient ischemic attack      F   the patient's father died after multiple strokes at the age of 64. The patient's mother died at the age of 72 with Alzheimer's disease. The patient had one brother, one sister. The patient's paternal grandfather died from lung cancer in his 51s. On the mother's side there are 2 (second) cousins with breast cancer diagnosed in their early 55s, and a more remote cousin diagnosed with breast cancer at age 65. There is no history of ovarian cancer.   GYNECOLOGIC HISTORY:  No LMP recorded. Patient is postmenopausal.  menarche age 1, the patient is GX P0 and she understands that not carrying a child to term before the age of 69 essentially doubled the risk of breast cancer. She stopped having periods between age 17 and 69 and did not take hormone replacement. 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 She lives by herself, with her pet dog. Her son, adopted at birth, Terri Mayer, currently 20, attends [employer] in the psychology Department.     ADVANCED DIRECTIVES: Not in place. At the initial clinic visit the patient was given the appropriate forms to complete and notarize at her discretion.    HEALTH MAINTENANCE: Social History  Substance Use Topics  . Smoking status: Never Smoker   . Smokeless tobacco: Never  Used  . Alcohol Use: Yes     Comment: rarely      Colonoscopy: 12/28/2004  PAP:  Bone density:  Lipid panel:  Allergies  Allergen Reactions  . Codeine Nausea And Vomiting    Current Outpatient Prescriptions  Medication Sig Dispense Refill  . carvedilol (COREG) 3.125 MG tablet Take 1 tablet (3.125 mg total) by mouth 2 (two) times daily. (Patient not taking: Reported on 03/11/2016) 60 tablet 3  . cholestyramine (QUESTRAN) 4 g packet Take 4 g in slurry twice daily as needed for diarrhea (Patient not taking: Reported on 02/28/2016) 60 each 12  . ciprofloxacin (CIPRO) 500 MG tablet Take 1 tablet (500 mg total) by mouth 2 (two) times daily. 14 tablet 0  . clobetasol cream (TEMOVATE) 0.80 % Apply 1 application topically 2 (two) times daily. Reported on 02/28/2016  4  . dexamethasone (DECADRON) 4 MG tablet Take 2 tablets (8 mg total) by mouth 2 (two) times daily. Start the day before Taxotere. Then again the day after chemo for 3 days. 60 tablet 0  .  ibuprofen (ADVIL,MOTRIN) 200 MG tablet Take 200 mg by mouth every 6 (six) hours as needed for fever or mild pain.    Marland Kitchen lidocaine-prilocaine (EMLA) cream Apply to affected area once 30 g 3  . LORazepam (ATIVAN) 0.5 MG tablet Take 0.5 mg by mouth daily.    . ondansetron (ZOFRAN) 8 MG tablet Take 1 tablet (8 mg total) by mouth 2 (two) times daily. Start the day after chemo for 3 days. Then take as needed for nausea or vomiting. 30 tablet 1  . promethazine (PHENERGAN) 25 MG suppository Place 25 mg rectally daily as needed. Reported on 03/11/2016  0   Current Facility-Administered Medications  Medication Dose Route Frequency Provider Last Rate Last Dose  . 0.9 %  sodium chloride infusion   Intravenous Once Chauncey Cruel, MD      . ondansetron [facility]) 8 mg in sodium chloride 0.9 % 50 mL IVPB   Intravenous Once Chauncey Cruel, MD        OBJECTIVE: Middle-aged white woman who appears stated age 65 Vitals:   03/18/16 1214  BP: 113/83  Pulse:  114  Temp: 101.1 F (38.4 C)  Resp: 18     Body mass index is 35.03 kg/(m^2).    ECOG FS:1 - Symptomatic but completely ambulatory   Sclerae unicteric, pupils round and equal Oropharynx clear and moist-- no thrush or other lesions No cervical or supraclavicular adenopathy Lungs no rales or rhonchi Heart regular rate and rhythm Abd soft, nontender, positive bowel sounds MSK no focal spinal tenderness, no upper extremity lymphedema Neuro: nonfocal, well oriented, appropriate affect Breasts: I do not palpate a mass in either breast. Both axillae are benign    LAB RESULTS:  CMP     Component Value Date/Time   NA 140 03/18/2016 1134   K 4.0 03/18/2016 1134   CO2 30* 03/18/2016 1134   GLUCOSE 108 03/18/2016 1134   BUN 15.3 03/18/2016 1134   CREATININE 1.0 03/18/2016 1134   CALCIUM 9.2 03/18/2016 1134   PROT 6.6 03/18/2016 1134   ALBUMIN 3.8 03/18/2016 1134   AST 25 03/18/2016 1134   ALT 59* 03/18/2016 1134   ALKPHOS 189* 03/18/2016 1134   BILITOT 1.38* 03/18/2016 1134    INo results found for: SPEP, UPEP  Lab Results  Component Value Date   WBC 29.2* 03/18/2016   NEUTROABS 25.7* 03/18/2016   HGB 11.8 03/18/2016   HCT 35.8 03/18/2016   MCV 85.1 03/18/2016   PLT 383 03/18/2016      Chemistry      Component Value Date/Time   NA 140 03/18/2016 1134   K 4.0 03/18/2016 1134   CO2 30* 03/18/2016 1134   BUN 15.3 03/18/2016 1134   CREATININE 1.0 03/18/2016 1134      Component Value Date/Time   CALCIUM 9.2 03/18/2016 1134   ALKPHOS 189* 03/18/2016 1134   AST 25 03/18/2016 1134   ALT 59* 03/18/2016 1134   BILITOT 1.38* 03/18/2016 1134       No results found for: LABCA2  No components found for: LABCA125  No results for input(s): INR in the last 168 hours.  Urinalysis    Component Value Date/Time   BILIRUBINUR neg 12/29/2015 1014   PROTEINUR pos 12/29/2015 1014   UROBILINOGEN negative 12/29/2015 1014   NITRITE neg 12/29/2015 1014   LEUKOCYTESUR  Negative 12/29/2015 1014   STUDIES: No results found.   ASSESSMENT: 65 y.o. [place] woman status post right breast lower outer quadrant and right axillary lymph  node biopsy 11/16/2015, also positive for a clinical  T1c pN1, stage IIA invasive ductal carcinoma grade 3, essentially estrogen and progesterone receptor negative but HER-2 amplified, with an MIB-1 of 30%  (a) the breast lesion had moderate positivity for the estrogen receptor at 10%; the axillary lymph node lesion was estrogen receptor negative. Both were progesterone receptor negative  (1) neoadjuvant chemotherapy with carboplatin, docetaxel, trastuzumab and pertuzumab for 6 cycles, started 12/18/2015  (a) docetaxel switched to gemcitabine for cycles 4-6 because of progressive neuropathy  (b) pertuzumab dose cut in half with cycle 4 due to excessive diarrhea, resumed full dose cycle 5  (2) trastuzumab to be continued to total one year  (a) baseline echocardiogram on 12/12/15 showed an EF of 60-65%  (3) breast conserving surgery with consideration of the Alliance trial to follow  (4) adjuvant radiation as appropriate  PLAN: Terri Mayer is doing well with her neoadjuvant chemotherapy and only has one cycle to go. She is febrile today but this is non focal and she is not neutropenic. She is very reliable and vitals are otherwise stable. I think it is safe to treat her as outpatient.  We willl go ahead and obtain urine and blood cultures and a chest X ray. She will then start cipro BID. I will set her up for IVF tomorrow as well, and then a return visit later this week to make sure she remains stable.  I expect the slight change in LFTs is transient and secondary to her chemotherapy. We will repeat that next treatment.  She is already set up for her final, sixth cycle of neoadjuvant chemotherapy, as well as for a follow-up repeat breast MRI and repeat echocardiogram. She is going to return to see me June 9 to review those results. She will  be ready to proceed to surgery at that time.  Chauncey Cruel, MD   03/18/2016 2:18 PM

## 2016-03-18 ENCOUNTER — Ambulatory Visit: Payer: PPO

## 2016-03-18 ENCOUNTER — Ambulatory Visit (HOSPITAL_BASED_OUTPATIENT_CLINIC_OR_DEPARTMENT_OTHER): Payer: PPO | Admitting: Oncology

## 2016-03-18 ENCOUNTER — Other Ambulatory Visit (HOSPITAL_BASED_OUTPATIENT_CLINIC_OR_DEPARTMENT_OTHER): Payer: PPO

## 2016-03-18 VITALS — BP 113/83 | HR 114 | Temp 101.1°F | Resp 18 | Ht 66.0 in | Wt 216.9 lb

## 2016-03-18 DIAGNOSIS — C50211 Malignant neoplasm of upper-inner quadrant of right female breast: Secondary | ICD-10-CM

## 2016-03-18 DIAGNOSIS — R509 Fever, unspecified: Secondary | ICD-10-CM

## 2016-03-18 LAB — COMPREHENSIVE METABOLIC PANEL
ALBUMIN: 3.8 g/dL (ref 3.5–5.0)
ALK PHOS: 189 U/L — AB (ref 40–150)
ALT: 59 U/L — ABNORMAL HIGH (ref 0–55)
AST: 25 U/L (ref 5–34)
Anion Gap: 9 mEq/L (ref 3–11)
BUN: 15.3 mg/dL (ref 7.0–26.0)
CALCIUM: 9.2 mg/dL (ref 8.4–10.4)
CO2: 30 mEq/L — ABNORMAL HIGH (ref 22–29)
Chloride: 101 mEq/L (ref 98–109)
Creatinine: 1 mg/dL (ref 0.6–1.1)
EGFR: 62 mL/min/{1.73_m2} — ABNORMAL LOW (ref >90)
Glucose: 108 mg/dl (ref 70–140)
POTASSIUM: 4 meq/L (ref 3.5–5.1)
Sodium: 140 mEq/L (ref 136–145)
Total Bilirubin: 1.38 mg/dL — ABNORMAL HIGH (ref 0.20–1.20)
Total Protein: 6.6 g/dL (ref 6.4–8.3)

## 2016-03-18 LAB — CBC WITH DIFFERENTIAL/PLATELET
BASO%: 0.4 % (ref 0.0–2.0)
BASOS ABS: 0.1 10*3/uL (ref 0.0–0.1)
EOS ABS: 0 10*3/uL (ref 0.0–0.5)
EOS%: 0.1 % (ref 0.0–7.0)
HEMATOCRIT: 35.8 % (ref 34.8–46.6)
HEMOGLOBIN: 11.8 g/dL (ref 11.6–15.9)
LYMPH#: 1.8 10*3/uL (ref 0.9–3.3)
LYMPH%: 6.2 % — ABNORMAL LOW (ref 14.0–49.7)
MCH: 28.1 pg (ref 25.1–34.0)
MCHC: 33 g/dL (ref 31.5–36.0)
MCV: 85.1 fL (ref 79.5–101.0)
MONO#: 1.5 10*3/uL — ABNORMAL HIGH (ref 0.1–0.9)
MONO%: 5.3 % (ref 0.0–14.0)
NEUT#: 25.7 10*3/uL — ABNORMAL HIGH (ref 1.5–6.5)
NEUT%: 88 % — ABNORMAL HIGH (ref 38.4–76.8)
PLATELETS: 383 10*3/uL (ref 145–400)
RBC: 4.2 10*6/uL (ref 3.70–5.45)
RDW: 19.3 % — AB (ref 11.2–14.5)
WBC: 29.2 10*3/uL — AB (ref 3.9–10.3)

## 2016-03-18 MED ORDER — SODIUM CHLORIDE 0.9 % IV SOLN
Freq: Once | INTRAVENOUS | Status: DC
  Administered 2016-03-18: 13:00:00 via INTRAVENOUS

## 2016-03-18 MED ORDER — CIPROFLOXACIN HCL 500 MG PO TABS: 500.0000 mg | ORAL_TABLET | Freq: Two times a day (BID) | ORAL | Status: DC

## 2016-03-18 MED ORDER — SODIUM CHLORIDE 0.9 % IV SOLN
Freq: Once | INTRAVENOUS | Status: DC
  Administered 2016-03-18: 13:00:00 via INTRAVENOUS
  Filled 2016-03-18: qty 4

## 2016-03-18 MED FILL — LIDOCAINE-PRILOCAINE CREAM: 2.5-2.5 | 10 days supply | Qty: 30 | Fill #1

## 2016-03-18 MED FILL — CIPROFLOXACIN HCL 500 MG TA: 500 | 7 days supply | Qty: 14 | Fill #0

## 2016-03-19 ENCOUNTER — Ambulatory Visit (HOSPITAL_COMMUNITY)
Admission: RE | Admit: 2016-03-19 | Discharge: 2016-03-19 | Disposition: A | Discharge status: Home / Self Care | Payer: PPO | Source: Ambulatory Visit | Attending: Oncology | Admitting: Oncology

## 2016-03-19 ENCOUNTER — Ambulatory Visit (HOSPITAL_BASED_OUTPATIENT_CLINIC_OR_DEPARTMENT_OTHER): Payer: PPO

## 2016-03-19 VITALS — BP 117/51 | HR 77 | Temp 98.9°F | Resp 20

## 2016-03-19 DIAGNOSIS — C50211 Malignant neoplasm of upper-inner quadrant of right female breast: Secondary | ICD-10-CM | POA: Insufficient documentation

## 2016-03-19 DIAGNOSIS — M8580 Other specified disorders of bone density and structure, unspecified site: Secondary | ICD-10-CM | POA: Diagnosis not present

## 2016-03-19 DIAGNOSIS — C50911 Malignant neoplasm of unspecified site of right female breast: Secondary | ICD-10-CM | POA: Diagnosis not present

## 2016-03-19 DIAGNOSIS — E860 Dehydration: Secondary | ICD-10-CM | POA: Diagnosis not present

## 2016-03-19 LAB — URINE CULTURE

## 2016-03-19 MED ORDER — ONDANSETRON HCL 40 MG/20ML IJ SOLN
Freq: Once | INTRAMUSCULAR | Status: AC
  Administered 2016-03-19: 09:00:00 via INTRAVENOUS
  Filled 2016-03-19: qty 4

## 2016-03-19 MED ORDER — SODIUM CHLORIDE 0.9 % IJ SOLN
10.0000 mL | INTRAMUSCULAR | Status: DC | PRN
  Administered 2016-03-19: 10 mL
  Filled 2016-03-19: qty 10

## 2016-03-19 MED ORDER — SODIUM CHLORIDE 0.45 % IV SOLN
Freq: Once | INTRAVENOUS | Status: AC
  Administered 2016-03-19: 09:00:00 via INTRAVENOUS
  Filled 2016-03-19: qty 1000

## 2016-03-19 MED ORDER — HEPARIN SOD (PORK) LOCK FLUSH 100 UNIT/ML IV SOLN
500.0000 [IU] | Freq: Once | INTRAVENOUS | Status: AC | PRN
  Administered 2016-03-19: 500 [IU]
  Filled 2016-03-19: qty 5

## 2016-03-19 NOTE — Patient Instructions (Signed)

## 2016-03-21 ENCOUNTER — Other Ambulatory Visit: Payer: Self-pay | Admitting: Nurse Practitioner

## 2016-03-22 ENCOUNTER — Ambulatory Visit (HOSPITAL_BASED_OUTPATIENT_CLINIC_OR_DEPARTMENT_OTHER): Payer: PPO | Admitting: Nurse Practitioner

## 2016-03-22 ENCOUNTER — Encounter: Payer: Self-pay | Admitting: Nurse Practitioner

## 2016-03-22 VITALS — BP 97/59 | HR 96 | Temp 98.9°F | Resp 20 | Ht 66.0 in | Wt 215.9 lb

## 2016-03-22 DIAGNOSIS — C50211 Malignant neoplasm of upper-inner quadrant of right female breast: Secondary | ICD-10-CM | POA: Diagnosis not present

## 2016-03-22 NOTE — Progress Notes (Signed)
[place]  [phone] [phone]   ID: [name] DOB: [birth year]  MR#: [id number]  PRF#:[id number]  Patient Care Team: [name], MD as PCP - General (Internal Medicine) Chauncey Cruel, MD as Consulting Physician (Oncology) Rolm Bookbinder, MD as Consulting Physician (General Surgery) Thea Silversmith, MD as Consulting Physician (Radiation Oncology) Princess Bruins, MD as Consulting Physician (Obstetrics and Gynecology) Jolaine Artist, MD as Consulting Physician (Cardiology) PCP: [name], MD GYN: SU:  OTHER MD:  CHIEF COMPLAINT: right breast cancer  CURRENT TREATMENT: neoadjuvant chemotherapy  BREAST CANCER HISTORY: From the original intake note:  "Terri Mayer" underwent bilateral screening mammography with tomosynthesis at the breast center 11/02/2015 showing a possible mass in the right breast and low right axillary area. She was called back for right diagnostic mammography with tomosynthesis and right ultrasonography 11/14/2015. The breast density was category B. There was a lobulated mass in the lateral right breast, with a smaller oval mass immediately adjacent to it. Also in the upper outer quadrant there was a third mass. This has been stable back to 2014 and is felt to be a fibroadenoma.  Ultrasound of the right breast confirmed a hypoechoic mass at the 3:00 position 6 cm from the nipple, measuring 1.4 cm. Adjacent to this there was a 0.5 cm internal mammary lymph node, with normal morphology and normal hilar fat. The mass at the 10:00 position measured 0.8 cm and again was consistent with a fibroadenoma. In the right axilla there was an abnormal lymph node with no internal fat measuring 1.8 cm. The rest of the exam was unremarkable.  Biopsy of the right breast 8:30 o'clock lesion and the right axillary lymph node on 11/16/2015 showed ([id number]) both to be positive for invasive ductal carcinoma, grade 3, the  breast mass being estrogen receptor 10% positive with moderate staining intensity, but progesterone receptor negative, but the lymph node being estrogen and progesterone both negative. Both the breast and lymph nodes had MIB-1 of 30%. Both were positive for HER-2 amplification, the signals ratio is being 6.06 and 7.61, and the number per cell 10.00 and 11.80.  The patient's subsequent history is as detailed below   INTERVAL HISTORY: Terri Mayer returns for follow up of her breast cancer accompanied by a friend. Today is day 12, cycle 5 of 6 planned cycles of carboplatin, gemcitabine, trastuzumab and pertuzumab. On Monday she was febrile and Dr. Jana Hakim started her on cipro BID x 7 days. She complains this is making her stomach upset and causing diarrhea. She stopped taking the coreg because she feels like she is on too many pills. The diarrhea is better managed with imodium. She has not been taking anything for her nausea.   REVIEW OF SYSTEMS: Terri Mayer denies fevers or chills. Her appetite is better and she is staying better hydrated. She denies pain. She has no mouth sores or rashes. Her neuropathy symptoms are slowly fading. A detailed review of systems is otherwise stable.   PAST MEDICAL HISTORY: Past Medical History  Diagnosis Date  . History of chicken pox   . Allergy   . Breast cancer ([place])   . Osteoarthritis     Neck,Back,Knees,Hips,Ankles    PAST SURGICAL HISTORY: Past Surgical History  Procedure Laterality Date  . Eye surgery  at age 3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  . Wisdom tooth extraction    . Dilation and curettage of uterus    . Portacath placement Right 12/11/2015    Procedure:  INSERTION PORT-A-CATH WITH ULTRASOUND;  Surgeon: Rolm Bookbinder, MD;  Location: [facility];  Service: General;  Laterality: Right;    FAMILY HISTORY Family History  Problem Relation Age of Onset  . Heart disease Father   . Diabetes      GM  . Colon cancer Neg Hx   . Breast cancer Neg Hx     . Dementia      M  . Transient ischemic attack      F   the patient's father died after multiple strokes at the age of 55. The patient's mother died at the age of 71 with Alzheimer's disease. The patient had one brother, one sister. The patient's paternal grandfather died from lung cancer in his 35s. On the mother's side there are 2 (second) cousins with breast cancer diagnosed in their early 11s, and a more remote cousin diagnosed with breast cancer at age 64. There is no history of ovarian cancer.   GYNECOLOGIC HISTORY:  No LMP recorded. Patient is postmenopausal.  menarche age 64, the patient is GX P0 and she understands that not carrying a child to term before the age of 65 essentially doubled the risk of breast cancer. She stopped having periods between age 38 and 68 and did not take hormone replacement. 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 She lives by herself, with her pet dog. Her son, adopted at birth, Terri Mayer, currently 20, attends [employer] in the psychology Department.     ADVANCED DIRECTIVES: Not in place. At the initial clinic visit the patient was given the appropriate forms to complete and notarize at her discretion.    HEALTH MAINTENANCE: Social History  Substance Use Topics  . Smoking status: Never Smoker   . Smokeless tobacco: Never Used  . Alcohol Use: Yes     Comment: rarely      Colonoscopy: 12/28/2004  PAP:  Bone density:  Lipid panel:  Allergies  Allergen Reactions  . Codeine Nausea And Vomiting    Current Outpatient Prescriptions  Medication Sig Dispense Refill  . ciprofloxacin (CIPRO) 500 MG tablet Take 1 tablet (500 mg total) by mouth 2 (two) times daily. 14 tablet 0  . dexamethasone (DECADRON) 4 MG tablet Take 2 tablets (8 mg total) by mouth 2 (two) times daily. Start the day before Taxotere. Then again the  day after chemo for 3 days. 60 tablet 0  . lidocaine-prilocaine (EMLA) cream Apply to affected area once 30 g 3  . carvedilol (COREG) 3.125 MG tablet Take 1 tablet (3.125 mg total) by mouth 2 (two) times daily. (Patient not taking: Reported on 03/11/2016) 60 tablet 3  . cholestyramine (QUESTRAN) 4 g packet Take 4 g in slurry twice daily as needed for diarrhea (Patient not taking: Reported on 02/28/2016) 60 each 12  . clobetasol cream (TEMOVATE) 8.14 % Apply 1 application topically 2 (two) times daily. Reported on 03/22/2016  4  . ibuprofen (ADVIL,MOTRIN) 200 MG tablet Take 200 mg by mouth every 6 (six) hours as needed for fever or mild pain. Reported on 03/22/2016    . LORazepam (ATIVAN) 0.5 MG tablet Take 0.5 mg by mouth daily. Reported on 03/22/2016    . ondansetron (ZOFRAN) 8 MG tablet Take 1 tablet (8 mg total) by mouth 2 (two) times daily. Start the day after chemo for 3 days.  Then take as needed for nausea or vomiting. (Patient not taking: Reported on 03/22/2016) 30 tablet 1  . promethazine (PHENERGAN) 25 MG suppository Place 25 mg rectally daily as needed. Reported on 03/22/2016  0   No current facility-administered medications for this visit.    OBJECTIVE: Middle-aged white woman who appears stated age 26 Vitals:   03/22/16 1045  BP: 97/59  Pulse: 96  Temp: 98.9 F (37.2 C)  Resp: 20     Body mass index is 34.86 kg/(m^2).    ECOG FS:1 - Symptomatic but completely ambulatory  Skin: warm, dry  HEENT: sclerae anicteric, conjunctivae pink, oropharynx clear. No thrush or mucositis.  Lymph Nodes: No cervical or supraclavicular lymphadenopathy  Lungs: clear to auscultation bilaterally, no rales, wheezes, or rhonci  Heart: regular rate and rhythm  Abdomen: round, soft, non tender, positive bowel sounds  Musculoskeletal: No focal spinal tenderness, no peripheral edema  Neuro: non focal, well oriented, positive affect  Breasts: deferred  LAB RESULTS:  CMP     Component Value Date/Time   NA  140 03/18/2016 1134   K 4.0 03/18/2016 1134   CO2 30* 03/18/2016 1134   GLUCOSE 108 03/18/2016 1134   BUN 15.3 03/18/2016 1134   CREATININE 1.0 03/18/2016 1134   CALCIUM 9.2 03/18/2016 1134   PROT 6.6 03/18/2016 1134   ALBUMIN 3.8 03/18/2016 1134   AST 25 03/18/2016 1134   ALT 59* 03/18/2016 1134   ALKPHOS 189* 03/18/2016 1134   BILITOT 1.38* 03/18/2016 1134    INo results found for: SPEP, UPEP  Lab Results  Component Value Date   WBC 29.2* 03/18/2016   NEUTROABS 25.7* 03/18/2016   HGB 11.8 03/18/2016   HCT 35.8 03/18/2016   MCV 85.1 03/18/2016   PLT 383 03/18/2016      Chemistry      Component Value Date/Time   NA 140 03/18/2016 1134   K 4.0 03/18/2016 1134   CO2 30* 03/18/2016 1134   BUN 15.3 03/18/2016 1134   CREATININE 1.0 03/18/2016 1134      Component Value Date/Time   CALCIUM 9.2 03/18/2016 1134   ALKPHOS 189* 03/18/2016 1134   AST 25 03/18/2016 1134   ALT 59* 03/18/2016 1134   BILITOT 1.38* 03/18/2016 1134       No results found for: LABCA2  No components found for: LABCA125  No results for input(s): INR in the last 168 hours.  Urinalysis    Component Value Date/Time   BILIRUBINUR neg 12/29/2015 1014   PROTEINUR pos 12/29/2015 1014   UROBILINOGEN negative 12/29/2015 1014   NITRITE neg 12/29/2015 1014   LEUKOCYTESUR Negative 12/29/2015 1014   STUDIES: Dg Chest 2 View  03/19/2016  CLINICAL DATA:  Right breast cancer, fever EXAM: CHEST  2 VIEW COMPARISON:  12/11/2015 FINDINGS: Cardiomediastinal silhouette is unremarkable. No acute infiltrate or pleural effusion. No pulmonary edema. There is right IJ Port-A-Cath with tip in distal SVC. Osteopenia and mild degenerative changes thoracic spine. IMPRESSION: Right IJ Port-A-Cath in place. No pneumothorax. No active disease. Osteopenia and mild degenerative changes thoracic spine. Electronically Signed   By: Lahoma Crocker M.D.   On: 03/19/2016 08:44     ASSESSMENT: 65 y.o. [place] woman status post right  breast lower outer quadrant and right axillary lymph node biopsy 11/16/2015, also positive for a clinical  T1c pN1, stage IIA invasive ductal carcinoma grade 3, essentially estrogen and progesterone receptor negative but HER-2 amplified, with an MIB-1 of 30%  (a) the breast lesion had moderate  positivity for the estrogen receptor at 10%; the axillary lymph node lesion was estrogen receptor negative. Both were progesterone receptor negative  (1) neoadjuvant chemotherapy with carboplatin, docetaxel, trastuzumab and pertuzumab for 6 cycles, started 12/18/2015  (a) docetaxel switched to gemcitabine for cycles 4-6 because of progressive neuropathy  (b) pertuzumab dose cut in half with cycle 4 due to excessive diarrhea, resumed full dose cycle 5  (2) trastuzumab to be continued to total one year  (a) baseline echocardiogram on 12/12/15 showed an EF of 60-65%  (3) breast conserving surgery with consideration of the Alliance trial to follow  (4) adjuvant radiation as appropriate  PLAN: I am having Terri Mayer stop the cipro after 5 days use because it is really just making her ill at this point. Her vitals are stable. She is afebrile and there is no evidence of a UTI or blood infection. I have asked her to restart her coreg as well.   Terri Mayer will return in 10 days for her 6th and final treatment. She understands and agree with this plan. She knows the goal of treatment in her case is cure. She has been encouraged to call with any issues that might arise before her next visit here.   Laurie Panda, NP   03/22/2016 11:20 AM

## 2016-03-24 LAB — CULTURE, BLOOD (SINGLE)

## 2016-03-27 ENCOUNTER — Other Ambulatory Visit: Payer: PPO

## 2016-03-27 ENCOUNTER — Ambulatory Visit: Payer: PPO | Admitting: Oncology

## 2016-04-01 ENCOUNTER — Ambulatory Visit (HOSPITAL_BASED_OUTPATIENT_CLINIC_OR_DEPARTMENT_OTHER): Payer: PPO

## 2016-04-01 ENCOUNTER — Other Ambulatory Visit (HOSPITAL_BASED_OUTPATIENT_CLINIC_OR_DEPARTMENT_OTHER): Payer: PPO

## 2016-04-01 ENCOUNTER — Encounter: Payer: PPO | Admitting: Nutrition

## 2016-04-01 ENCOUNTER — Other Ambulatory Visit: Payer: Self-pay | Admitting: Hematology and Oncology

## 2016-04-01 ENCOUNTER — Ambulatory Visit: Payer: PPO

## 2016-04-01 ENCOUNTER — Ambulatory Visit (HOSPITAL_BASED_OUTPATIENT_CLINIC_OR_DEPARTMENT_OTHER): Payer: PPO | Admitting: Nurse Practitioner

## 2016-04-01 ENCOUNTER — Encounter: Payer: Self-pay | Admitting: *Deleted

## 2016-04-01 ENCOUNTER — Encounter: Payer: Self-pay | Admitting: Nurse Practitioner

## 2016-04-01 ENCOUNTER — Other Ambulatory Visit: Payer: Self-pay | Admitting: Nurse Practitioner

## 2016-04-01 VITALS — BP 109/51 | HR 77 | Temp 99.0°F | Resp 18 | Ht 66.0 in | Wt 212.9 lb

## 2016-04-01 DIAGNOSIS — Z5111 Encounter for antineoplastic chemotherapy: Secondary | ICD-10-CM | POA: Diagnosis not present

## 2016-04-01 DIAGNOSIS — C50211 Malignant neoplasm of upper-inner quadrant of right female breast: Secondary | ICD-10-CM

## 2016-04-01 DIAGNOSIS — Z5112 Encounter for antineoplastic immunotherapy: Secondary | ICD-10-CM | POA: Diagnosis not present

## 2016-04-01 DIAGNOSIS — R931 Abnormal findings on diagnostic imaging of heart and coronary circulation: Secondary | ICD-10-CM

## 2016-04-01 LAB — CBC WITH DIFFERENTIAL/PLATELET
BASO%: 0.1 % (ref 0.0–2.0)
Basophils Absolute: 0 10*3/uL (ref 0.0–0.1)
EOS ABS: 0 10*3/uL (ref 0.0–0.5)
EOS%: 0 % (ref 0.0–7.0)
HCT: 33.7 % — ABNORMAL LOW (ref 34.8–46.6)
HGB: 11.3 g/dL — ABNORMAL LOW (ref 11.6–15.9)
LYMPH%: 7.2 % — ABNORMAL LOW (ref 14.0–49.7)
MCH: 29.2 pg (ref 25.1–34.0)
MCHC: 33.5 g/dL (ref 31.5–36.0)
MCV: 87.3 fL (ref 79.5–101.0)
MONO#: 0.1 10*3/uL (ref 0.1–0.9)
MONO%: 0.8 % (ref 0.0–14.0)
NEUT%: 91.9 % — AB (ref 38.4–76.8)
NEUTROS ABS: 8 10*3/uL — AB (ref 1.5–6.5)
Platelets: 357 10*3/uL (ref 145–400)
RBC: 3.85 10*6/uL (ref 3.70–5.45)
RDW: 20 % — ABNORMAL HIGH (ref 11.2–14.5)
WBC: 8.7 10*3/uL (ref 3.9–10.3)
lymph#: 0.6 10*3/uL — ABNORMAL LOW (ref 0.9–3.3)

## 2016-04-01 LAB — COMPREHENSIVE METABOLIC PANEL
ALT: 26 U/L (ref 0–55)
ANION GAP: 11 meq/L (ref 3–11)
AST: 20 U/L (ref 5–34)
Albumin: 3.9 g/dL (ref 3.5–5.0)
Alkaline Phosphatase: 63 U/L (ref 40–150)
BUN: 22.2 mg/dL (ref 7.0–26.0)
CHLORIDE: 104 meq/L (ref 98–109)
CO2: 26 meq/L (ref 22–29)
Calcium: 9.9 mg/dL (ref 8.4–10.4)
Creatinine: 1 mg/dL (ref 0.6–1.1)
EGFR: 63 mL/min/{1.73_m2} — AB (ref >90)
GLUCOSE: 155 mg/dL — AB (ref 70–140)
POTASSIUM: 3.6 meq/L (ref 3.5–5.1)
SODIUM: 142 meq/L (ref 136–145)
TOTAL PROTEIN: 7 g/dL (ref 6.4–8.3)
Total Bilirubin: 1.53 mg/dL — ABNORMAL HIGH (ref 0.20–1.20)

## 2016-04-01 MED ORDER — SODIUM CHLORIDE 0.9 % IV SOLN
Freq: Once | INTRAVENOUS | Status: AC
  Administered 2016-04-01: 11:00:00 via INTRAVENOUS
  Filled 2016-04-01: qty 2

## 2016-04-01 MED ORDER — ACETAMINOPHEN 325 MG PO TABS
650.0000 mg | ORAL_TABLET | Freq: Once | ORAL | Status: AC
  Administered 2016-04-01: 650 mg via ORAL

## 2016-04-01 MED ORDER — TRASTUZUMAB CHEMO INJECTION 440 MG
6.0000 mg/kg | Freq: Once | INTRAVENOUS | Status: AC
  Administered 2016-04-01: 609 mg via INTRAVENOUS
  Filled 2016-04-01: qty 29

## 2016-04-01 MED ORDER — PALONOSETRON HCL INJECTION 0.25 MG/5ML
INTRAVENOUS | Status: AC
  Filled 2016-04-01: qty 5

## 2016-04-01 MED ORDER — SODIUM CHLORIDE 0.9 % IJ SOLN
10.0000 mL | INTRAMUSCULAR | Status: DC | PRN
Start: 2016-04-01 — End: 2016-04-01
  Administered 2016-04-01: 10 mL
  Filled 2016-04-01: qty 10

## 2016-04-01 MED ORDER — SODIUM CHLORIDE 0.9 % IV SOLN
800.0000 mg/m2 | Freq: Once | INTRAVENOUS | Status: AC
  Administered 2016-04-01: 1862 mg via INTRAVENOUS
  Filled 2016-04-01: qty 48.97

## 2016-04-01 MED ORDER — ACETAMINOPHEN 325 MG PO TABS
ORAL_TABLET | ORAL | Status: AC
  Filled 2016-04-01: qty 2

## 2016-04-01 MED ORDER — DIPHENHYDRAMINE HCL 25 MG PO CAPS
ORAL_CAPSULE | ORAL | Status: AC
  Filled 2016-04-01: qty 2

## 2016-04-01 MED ORDER — SODIUM CHLORIDE 0.9 % IV SOLN
Freq: Once | INTRAVENOUS | Status: AC
  Administered 2016-04-01: 10:00:00 via INTRAVENOUS

## 2016-04-01 MED ORDER — SODIUM CHLORIDE 0.9 % IV SOLN
570.0000 mg | Freq: Once | INTRAVENOUS | Status: AC
  Administered 2016-04-01: 570 mg via INTRAVENOUS
  Filled 2016-04-01: qty 57

## 2016-04-01 MED ORDER — HEPARIN SOD (PORK) LOCK FLUSH 100 UNIT/ML IV SOLN
500.0000 [IU] | Freq: Once | INTRAVENOUS | Status: AC | PRN
  Administered 2016-04-01: 500 [IU]
  Filled 2016-04-01: qty 5

## 2016-04-01 MED ORDER — PEGFILGRASTIM 6 MG/0.6ML ~~LOC~~ PSKT
6.0000 mg | PREFILLED_SYRINGE | Freq: Once | SUBCUTANEOUS | Status: AC
  Administered 2016-04-01: 6 mg via SUBCUTANEOUS
  Filled 2016-04-01: qty 0.6

## 2016-04-01 MED ORDER — DIPHENHYDRAMINE HCL 25 MG PO CAPS
50.0000 mg | ORAL_CAPSULE | Freq: Once | ORAL | Status: AC
  Administered 2016-04-01: 50 mg via ORAL

## 2016-04-01 MED ORDER — SODIUM CHLORIDE 0.9 % IV SOLN
420.0000 mg | Freq: Once | INTRAVENOUS | Status: AC
  Administered 2016-04-01: 420 mg via INTRAVENOUS
  Filled 2016-04-01: qty 14

## 2016-04-01 MED ORDER — PALONOSETRON HCL INJECTION 0.25 MG/5ML
0.2500 mg | Freq: Once | INTRAVENOUS | Status: AC
  Administered 2016-04-01: 0.25 mg via INTRAVENOUS

## 2016-04-01 NOTE — Progress Notes (Signed)
[place]  Telephone:(336) [phone] Fax:(336) [phone]   ID: SONORA CATLIN DOB: 12/21/1950  MR#: [id number]  JYN#:[id number]  Patient Care Team: [name], MD as PCP - General (Internal Medicine) Chauncey Cruel, MD as Consulting Physician (Oncology) Rolm Bookbinder, MD as Consulting Physician (General Surgery) Thea Silversmith, MD as Consulting Physician (Radiation Oncology) Princess Bruins, MD as Consulting Physician (Obstetrics and Gynecology) Jolaine Artist, MD as Consulting Physician (Cardiology) PCP: [name], MD GYN: SU:  OTHER MD:  CHIEF COMPLAINT: right breast cancer  CURRENT TREATMENT: neoadjuvant chemotherapy  BREAST CANCER HISTORY: From the original intake note:  "Terri Mayer" underwent bilateral screening mammography with tomosynthesis at the breast center 11/02/2015 showing a possible mass in the right breast and low right axillary area. She was called back for right diagnostic mammography with tomosynthesis and right ultrasonography 11/14/2015. The breast density was category B. There was a lobulated mass in the lateral right breast, with a smaller oval mass immediately adjacent to it. Also in the upper outer quadrant there was a third mass. This has been stable back to 2014 and is felt to be a fibroadenoma.  Ultrasound of the right breast confirmed a hypoechoic mass at the 3:00 position 6 cm from the nipple, measuring 1.4 cm. Adjacent to this there was a 0.5 cm internal mammary lymph node, with normal morphology and normal hilar fat. The mass at the 10:00 position measured 0.8 cm and again was consistent with a fibroadenoma. In the right axilla there was an abnormal lymph node with no internal fat measuring 1.8 cm. The rest of the exam was unremarkable.  Biopsy of the right breast 8:30 o'clock lesion and the right axillary lymph node on 11/16/2015 showed ([id number]) both to be positive for invasive ductal carcinoma, grade 3, the  breast mass being estrogen receptor 10% positive with moderate staining intensity, but progesterone receptor negative, but the lymph node being estrogen and progesterone both negative. Both the breast and lymph nodes had MIB-1 of 30%. Both were positive for HER-2 amplification, the signals ratio is being 6.06 and 7.61, and the number per cell 10.00 and 11.80.  The patient's subsequent history is as detailed below   INTERVAL HISTORY: Terri Mayer returns for follow up of her breast cancer accompanied by a friend. Today is day 1, cycle 6 of 6 planned cycles of carboplatin, gemcitabine, trastuzumab and pertuzumab.  REVIEW OF SYSTEMS: Terri Mayer denies fevers or chills. She vomited twice this past week after accidentally triggering her gag reflex while brushing her teeth. Her bowels are returning to normal since stopping the cipro. She has has less abdominal pain. Her appetite is fair and she is staying well hydrated. She denies mouth sores or rashes. The mild neuropathy symptoms to her fingers and toes are stable. A detailed review of systems is otherwise stable.  PAST MEDICAL HISTORY: Past Medical History  Diagnosis Date  . History of chicken pox   . Allergy   . Breast cancer ([place])   . Osteoarthritis     Neck,Back,Knees,Hips,Ankles    PAST SURGICAL HISTORY: Past Surgical History  Procedure Laterality Date  . Eye surgery  at age 17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  . Wisdom tooth extraction    . Dilation and curettage of uterus    . Portacath placement Right 12/11/2015    Procedure: INSERTION PORT-A-CATH WITH ULTRASOUND;  Surgeon: Rolm Bookbinder, MD;  Location: [facility];  Service: General;  Laterality: Right;    FAMILY HISTORY Family  History  Problem Relation Age of Onset  . Heart disease Father   . Diabetes      GM  . Colon cancer Neg Hx   . Breast cancer Neg Hx   . Dementia      M  . Transient ischemic attack      F   the patient's father died after multiple strokes at the age of  66. The patient's mother died at the age of 80 with Alzheimer's disease. The patient had one brother, one sister. The patient's paternal grandfather died from lung cancer in his 42s. On the mother's side there are 2 (second) cousins with breast cancer diagnosed in their early 58s, and a more remote cousin diagnosed with breast cancer at age 44. There is no history of ovarian cancer.   GYNECOLOGIC HISTORY:  No LMP recorded. Patient is postmenopausal.  menarche age 49, the patient is GX P0 and she understands that not carrying a child to term before the age of 62 essentially doubled the risk of breast cancer. She stopped having periods between age 60 and 49 and did not take hormone replacement. 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 She lives by herself, with her pet dog. Her son, adopted at birth, Berda Shelvin, currently 20, attends [employer] in the psychology Department.     ADVANCED DIRECTIVES: Not in place. At the initial clinic visit the patient was given the appropriate forms to complete and notarize at her discretion.    HEALTH MAINTENANCE: Social History  Substance Use Topics  . Smoking status: Never Smoker   . Smokeless tobacco: Never Used  . Alcohol Use: Yes     Comment: rarely      Colonoscopy: 12/28/2004  PAP:  Bone density:  Lipid panel:  Allergies  Allergen Reactions  . Codeine Nausea And Vomiting    Current Outpatient Prescriptions  Medication Sig Dispense Refill  . dexamethasone (DECADRON) 4 MG tablet Take 2 tablets (8 mg total) by mouth 2 (two) times daily. Start the day before Taxotere. Then again the day after chemo for 3 days. 60 tablet 0  . ibuprofen (ADVIL,MOTRIN) 200 MG tablet Take 200 mg by mouth every 6 (six) hours as needed for fever or mild pain. Reported on 03/22/2016    . lidocaine-prilocaine (EMLA) cream Apply to  affected area once 30 g 3  . LORazepam (ATIVAN) 0.5 MG tablet Take 0.5 mg by mouth daily. Reported on 03/22/2016    . carvedilol (COREG) 3.125 MG tablet Take 1 tablet (3.125 mg total) by mouth 2 (two) times daily. (Patient not taking: Reported on 03/11/2016) 60 tablet 3  . cholestyramine (QUESTRAN) 4 g packet Take 4 g in slurry twice daily as needed for diarrhea (Patient not taking: Reported on 02/28/2016) 60 each 12  . clobetasol cream (TEMOVATE) 3.22 % Apply 1 application topically 2 (two) times daily. Reported on 03/22/2016  4  . ondansetron (ZOFRAN) 8 MG tablet Take 1 tablet (8 mg total) by mouth 2 (two) times daily. Start the day after chemo for 3 days. Then take as needed for nausea or vomiting. (Patient not taking: Reported on 03/22/2016) 30 tablet 1  . promethazine (PHENERGAN) 25 MG suppository Place 25 mg rectally daily as needed. Reported on 04/01/2016  0   No current facility-administered medications for this visit.    OBJECTIVE: Middle-aged  white woman who appears stated age 65 Vitals:   04/01/16 0857  BP: 109/51  Pulse: 77  Temp: 99 F (37.2 C)  Resp: 18     Body mass index is 34.38 kg/(m^2).    ECOG FS:1 - Symptomatic but completely ambulatory  Sclerae unicteric, pupils round and equal Oropharynx clear and moist-- no thrush or other lesions No cervical or supraclavicular adenopathy Lungs no rales or rhonchi Heart regular rate and rhythm Abd soft, nontender, positive bowel sounds MSK no focal spinal tenderness, no upper extremity lymphedema Neuro: nonfocal, well oriented, appropriate affect Breasts: deferred  LAB RESULTS:  CMP     Component Value Date/Time   NA 142 04/01/2016 0832   K 3.6 04/01/2016 0832   CO2 26 04/01/2016 0832   GLUCOSE 155* 04/01/2016 0832   BUN 22.2 04/01/2016 0832   CREATININE 1.0 04/01/2016 0832   CALCIUM 9.9 04/01/2016 0832   PROT 7.0 04/01/2016 0832   ALBUMIN 3.9 04/01/2016 0832   AST 20 04/01/2016 0832   ALT 26 04/01/2016 0832   ALKPHOS  63 04/01/2016 0832   BILITOT 1.53* 04/01/2016 0832    INo results found for: SPEP, UPEP  Lab Results  Component Value Date   WBC 8.7 04/01/2016   NEUTROABS 8.0* 04/01/2016   HGB 11.3* 04/01/2016   HCT 33.7* 04/01/2016   MCV 87.3 04/01/2016   PLT 357 04/01/2016      Chemistry      Component Value Date/Time   NA 142 04/01/2016 0832   K 3.6 04/01/2016 0832   CO2 26 04/01/2016 0832   BUN 22.2 04/01/2016 0832   CREATININE 1.0 04/01/2016 0832      Component Value Date/Time   CALCIUM 9.9 04/01/2016 0832   ALKPHOS 63 04/01/2016 0832   AST 20 04/01/2016 0832   ALT 26 04/01/2016 0832   BILITOT 1.53* 04/01/2016 0832       No results found for: LABCA2  No components found for: LABCA125  No results for input(s): INR in the last 168 hours.  Urinalysis    Component Value Date/Time   BILIRUBINUR neg 12/29/2015 1014   PROTEINUR pos 12/29/2015 1014   UROBILINOGEN negative 12/29/2015 1014   NITRITE neg 12/29/2015 1014   LEUKOCYTESUR Negative 12/29/2015 1014   STUDIES: Dg Chest 2 View  03/19/2016  CLINICAL DATA:  Right breast cancer, fever EXAM: CHEST  2 VIEW COMPARISON:  12/11/2015 FINDINGS: Cardiomediastinal silhouette is unremarkable. No acute infiltrate or pleural effusion. No pulmonary edema. There is right IJ Port-A-Cath with tip in distal SVC. Osteopenia and mild degenerative changes thoracic spine. IMPRESSION: Right IJ Port-A-Cath in place. No pneumothorax. No active disease. Osteopenia and mild degenerative changes thoracic spine. Electronically Signed   By: Lahoma Crocker M.D.   On: 03/19/2016 08:44     ASSESSMENT: 65 y.o. [place] woman status post right breast lower outer quadrant and right axillary lymph node biopsy 11/16/2015, also positive for a clinical  T1c pN1, stage IIA invasive ductal carcinoma grade 3, essentially estrogen and progesterone receptor negative but HER-2 amplified, with an MIB-1 of 30%  (a) the breast lesion had moderate positivity for the estrogen  receptor at 10%; the axillary lymph node lesion was estrogen receptor negative. Both were progesterone receptor negative  (1) neoadjuvant chemotherapy with carboplatin, docetaxel, trastuzumab and pertuzumab for 6 cycles, started 12/18/2015  (a) docetaxel switched to gemcitabine for cycles 4-6 because of progressive neuropathy  (b) pertuzumab dose cut in half with cycle 4 due to excessive diarrhea, resumed full dose  cycle 5  (2) trastuzumab to be continued to total one year  (a) baseline echocardiogram on 12/12/15 showed an EF of 60-65%  (3) breast conserving surgery with consideration of the Alliance trial to follow  (4) adjuvant radiation as appropriate  PLAN: Terri Mayer is excited to be completing the chemotherapy portion of her treatment today. The labs were reviewed in detail and her bilirubin is elevated to 1.58 today. I consulted with Dr. Lindi Adie in Dr. Virgie Dad absence today. She suggested with proceeding with cycle 6 of carboplatin, gemcitabine, trastuzumab, and pertuzumab as planned today. She will return 2 days later this week for IV fluids.   She has informed me that she is choosing to stay off the coreg until she sees Dr. Haroldine Laws next week, despite understanding that she was put on this medicine due to her decreased ejection fraction.  Terri Mayer is scheduled for labs and a nadir visit next Monday. She understands and agree with this plan. She knows the goal of treatment in her case is cure. She has been encouraged to call with any issues that might arise before her next visit here.   Laurie Panda, NP   04/01/2016 9:49 AM

## 2016-04-01 NOTE — Patient Instructions (Signed)
[facility] Discharge Instructions for Patients Receiving Chemotherapy  Today you received the following chemotherapy agents; Herceptin, Perjeta, Gemzar and Carboplatin.   To help prevent nausea and vomiting after your treatment, we encourage you to take your nausea medication as directed.    If you develop nausea and vomiting that is not controlled by your nausea medication, call the clinic.   BELOW ARE SYMPTOMS THAT SHOULD BE REPORTED IMMEDIATELY:  *FEVER GREATER THAN 100.5 F  *CHILLS WITH OR WITHOUT FEVER  NAUSEA AND VOMITING THAT IS NOT CONTROLLED WITH YOUR NAUSEA MEDICATION  *UNUSUAL SHORTNESS OF BREATH  *UNUSUAL BRUISING OR BLEEDING  TENDERNESS IN MOUTH AND THROAT WITH OR WITHOUT PRESENCE OF ULCERS  *URINARY PROBLEMS  *BOWEL PROBLEMS  UNUSUAL RASH Items with * indicate a potential emergency and should be followed up as soon as possible.  Feel free to call the clinic you have any questions or concerns. The clinic phone number is [phone].  Please show the [facility] at check-in to the Emergency Department and triage nurse.

## 2016-04-05 ENCOUNTER — Ambulatory Visit (HOSPITAL_BASED_OUTPATIENT_CLINIC_OR_DEPARTMENT_OTHER): Payer: PPO

## 2016-04-05 VITALS — BP 122/71 | HR 68 | Temp 98.6°F | Resp 16

## 2016-04-05 DIAGNOSIS — E860 Dehydration: Secondary | ICD-10-CM

## 2016-04-05 DIAGNOSIS — C50211 Malignant neoplasm of upper-inner quadrant of right female breast: Secondary | ICD-10-CM | POA: Diagnosis not present

## 2016-04-05 MED ORDER — SODIUM CHLORIDE 0.9 % IV SOLN
INTRAVENOUS | Status: DC
  Administered 2016-04-05: 09:00:00 via INTRAVENOUS

## 2016-04-05 MED ORDER — HEPARIN SOD (PORK) LOCK FLUSH 100 UNIT/ML IV SOLN
500.0000 [IU] | Freq: Once | INTRAVENOUS | Status: AC | PRN
  Administered 2016-04-05: 500 [IU]
  Filled 2016-04-05: qty 5

## 2016-04-05 MED ORDER — SODIUM CHLORIDE 0.9 % IV SOLN: Freq: Once | INTRAVENOUS | Status: DC

## 2016-04-05 MED ORDER — SODIUM CHLORIDE 0.9 % IJ SOLN
10.0000 mL | INTRAMUSCULAR | Status: DC | PRN
  Administered 2016-04-05: 10 mL
  Filled 2016-04-05: qty 10

## 2016-04-05 NOTE — Patient Instructions (Signed)

## 2016-04-06 ENCOUNTER — Ambulatory Visit (HOSPITAL_BASED_OUTPATIENT_CLINIC_OR_DEPARTMENT_OTHER): Payer: PPO

## 2016-04-06 VITALS — BP 122/67 | HR 73 | Temp 98.3°F | Resp 18

## 2016-04-06 DIAGNOSIS — C50211 Malignant neoplasm of upper-inner quadrant of right female breast: Secondary | ICD-10-CM

## 2016-04-06 MED ORDER — HEPARIN SOD (PORK) LOCK FLUSH 100 UNIT/ML IV SOLN
500.0000 [IU] | Freq: Once | INTRAVENOUS | Status: AC | PRN
  Administered 2016-04-06: 500 [IU]
  Filled 2016-04-06: qty 5

## 2016-04-06 MED ORDER — SODIUM CHLORIDE 0.9 % IV SOLN
INTRAVENOUS | Status: DC
  Administered 2016-04-06: 09:00:00 via INTRAVENOUS

## 2016-04-06 MED ORDER — SODIUM CHLORIDE 0.9 % IJ SOLN
10.0000 mL | INTRAMUSCULAR | Status: DC | PRN
  Administered 2016-04-06: 10 mL
  Filled 2016-04-06: qty 10

## 2016-04-06 NOTE — Patient Instructions (Signed)

## 2016-04-08 ENCOUNTER — Encounter: Payer: Self-pay | Admitting: Nurse Practitioner

## 2016-04-08 ENCOUNTER — Ambulatory Visit (HOSPITAL_BASED_OUTPATIENT_CLINIC_OR_DEPARTMENT_OTHER): Payer: PPO | Admitting: Nurse Practitioner

## 2016-04-08 ENCOUNTER — Other Ambulatory Visit (HOSPITAL_BASED_OUTPATIENT_CLINIC_OR_DEPARTMENT_OTHER): Payer: PPO

## 2016-04-08 VITALS — BP 106/76 | HR 118 | Temp 98.7°F | Resp 18 | Wt 207.6 lb

## 2016-04-08 DIAGNOSIS — G629 Polyneuropathy, unspecified: Secondary | ICD-10-CM

## 2016-04-08 DIAGNOSIS — M1990 Unspecified osteoarthritis, unspecified site: Secondary | ICD-10-CM

## 2016-04-08 DIAGNOSIS — R17 Unspecified jaundice: Secondary | ICD-10-CM

## 2016-04-08 DIAGNOSIS — M545 Low back pain: Secondary | ICD-10-CM | POA: Diagnosis not present

## 2016-04-08 DIAGNOSIS — C50211 Malignant neoplasm of upper-inner quadrant of right female breast: Secondary | ICD-10-CM

## 2016-04-08 LAB — COMPREHENSIVE METABOLIC PANEL
ALBUMIN: 3.9 g/dL (ref 3.5–5.0)
ALK PHOS: 99 U/L (ref 40–150)
ALT: 51 U/L (ref 0–55)
ANION GAP: 12 meq/L — AB (ref 3–11)
AST: 27 U/L (ref 5–34)
BILIRUBIN TOTAL: 2.75 mg/dL — AB (ref 0.20–1.20)
BUN: 19.7 mg/dL (ref 7.0–26.0)
CALCIUM: 8.9 mg/dL (ref 8.4–10.4)
CHLORIDE: 99 meq/L (ref 98–109)
CO2: 28 mEq/L (ref 22–29)
CREATININE: 0.9 mg/dL (ref 0.6–1.1)
EGFR: 66 mL/min/{1.73_m2} — ABNORMAL LOW (ref >90)
Glucose: 118 mg/dl (ref 70–140)
Potassium: 3.3 mEq/L — ABNORMAL LOW (ref 3.5–5.1)
Sodium: 139 mEq/L (ref 136–145)
Total Protein: 6.7 g/dL (ref 6.4–8.3)

## 2016-04-08 LAB — CBC WITH DIFFERENTIAL/PLATELET
BASO%: 0 % (ref 0.0–2.0)
BASOS ABS: 0 10*3/uL (ref 0.0–0.1)
EOS%: 0.7 % (ref 0.0–7.0)
Eosinophils Absolute: 0.1 10*3/uL (ref 0.0–0.5)
HEMATOCRIT: 32.4 % — AB (ref 34.8–46.6)
HEMOGLOBIN: 10.9 g/dL — AB (ref 11.6–15.9)
LYMPH#: 1.2 10*3/uL (ref 0.9–3.3)
LYMPH%: 15.3 % (ref 14.0–49.7)
MCH: 29.8 pg (ref 25.1–34.0)
MCHC: 33.6 g/dL (ref 31.5–36.0)
MCV: 88.5 fL (ref 79.5–101.0)
MONO#: 0.2 10*3/uL (ref 0.1–0.9)
MONO%: 2.6 % (ref 0.0–14.0)
NEUT#: 6.2 10*3/uL (ref 1.5–6.5)
NEUT%: 81.4 % — AB (ref 38.4–76.8)
PLATELETS: 92 10*3/uL — AB (ref 145–400)
RBC: 3.66 10*6/uL — ABNORMAL LOW (ref 3.70–5.45)
RDW: 16.7 % — AB (ref 11.2–14.5)
WBC: 7.7 10*3/uL (ref 3.9–10.3)

## 2016-04-08 NOTE — Progress Notes (Signed)
[place]  [phone] [phone]   ID: Terri Mayer DOB: 1951-04-20  MR#: [id number]  BWL#:[id number]  Patient Care Team: [name], MD as PCP - General (Internal Medicine) Chauncey Cruel, MD as Consulting Physician (Oncology) Rolm Bookbinder, MD as Consulting Physician (General Surgery) Thea Silversmith, MD as Consulting Physician (Radiation Oncology) Princess Bruins, MD as Consulting Physician (Obstetrics and Gynecology) Jolaine Artist, MD as Consulting Physician (Cardiology) PCP: [name], MD GYN: SU:  OTHER MD:  CHIEF COMPLAINT: right breast cancer  CURRENT TREATMENT: neoadjuvant chemotherapy  BREAST CANCER HISTORY: From the original intake note:  "Terri Mayer" underwent bilateral screening mammography with tomosynthesis at the breast center 11/02/2015 showing a possible mass in the right breast and low right axillary area. She was called back for right diagnostic mammography with tomosynthesis and right ultrasonography 11/14/2015. The breast density was category B. There was a lobulated mass in the lateral right breast, with a smaller oval mass immediately adjacent to it. Also in the upper outer quadrant there was a third mass. This has been stable back to 2014 and is felt to be a fibroadenoma.  Ultrasound of the right breast confirmed a hypoechoic mass at the 3:00 position 6 cm from the nipple, measuring 1.4 cm. Adjacent to this there was a 0.5 cm internal mammary lymph node, with normal morphology and normal hilar fat. The mass at the 10:00 position measured 0.8 cm and again was consistent with a fibroadenoma. In the right axilla there was an abnormal lymph node with no internal fat measuring 1.8 cm. The rest of the exam was unremarkable.  Biopsy of the right breast 8:30 o'clock lesion and the right axillary lymph node on 11/16/2015 showed ([id number]) both to be positive for invasive ductal carcinoma, grade 3, the  breast mass being estrogen receptor 10% positive with moderate staining intensity, but progesterone receptor negative, but the lymph node being estrogen and progesterone both negative. Both the breast and lymph nodes had MIB-1 of 30%. Both were positive for HER-2 amplification, the signals ratio is being 6.06 and 7.61, and the number per cell 10.00 and 11.80.  The patient's subsequent history is as detailed below   INTERVAL HISTORY: Terri Mayer returns for follow up of her breast cancer accompanied by a friend. Today is day 8, cycle 6 of 6 planned cycles of carboplatin, gemcitabine, trastuzumab and pertuzumab.  REVIEW OF SYSTEMS: Terri Mayer is stable today. She denies fevers or chills. Her nausea is under better control since she has restarted the compazine, which she abandoned a few cycles back because she thought it caused a rash. She is having diarrhea, but is taking imodium appropriately. She is staying well hydrated and is eating well. She has no mouth sores. She feels like the neuropathy to her feet is a little worse. She is having back pain, secondary to arthritis, and has to lay still in bed for the past few days. Her energy is down. She feels a little weak. A detailed review of systems is otherwise stable.  PAST MEDICAL HISTORY: Past Medical History  Diagnosis Date  . History of chicken pox   . Allergy   . Breast cancer ([place])   . Osteoarthritis     Neck,Back,Knees,Hips,Ankles    PAST SURGICAL HISTORY: Past Surgical History  Procedure Laterality Date  . Eye surgery  at age 70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  . Wisdom tooth extraction    . Dilation and curettage of uterus    .  Portacath placement Right 12/11/2015    Procedure: INSERTION PORT-A-CATH WITH ULTRASOUND;  Surgeon: Rolm Bookbinder, MD;  Location: [facility];  Service: General;  Laterality: Right;    FAMILY HISTORY Family History  Problem Relation Age of Onset  . Heart disease Father   . Diabetes      GM  . Colon cancer  Neg Hx   . Breast cancer Neg Hx   . Dementia      M  . Transient ischemic attack      F   the patient's father died after multiple strokes at the age of 24. The patient's mother died at the age of 54 with Alzheimer's disease. The patient had one brother, one sister. The patient's paternal grandfather died from lung cancer in his 26s. On the mother's side there are 2 (second) cousins with breast cancer diagnosed in their early 11s, and a more remote cousin diagnosed with breast cancer at age 74. There is no history of ovarian cancer.   GYNECOLOGIC HISTORY:  No LMP recorded. Patient is postmenopausal.  menarche age 63, the patient is GX P0 and she understands that not carrying a child to term before the age of 6 essentially doubled the risk of breast cancer. She stopped having periods between age 19 and 5 and did not take hormone replacement. 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 She lives by herself, with her pet dog. Her son, adopted at birth, Terri Mayer, currently 20, attends [employer] in the psychology Department.     ADVANCED DIRECTIVES: Not in place. At the initial clinic visit the patient was given the appropriate forms to complete and notarize at her discretion.    HEALTH MAINTENANCE: Social History  Substance Use Topics  . Smoking status: Never Smoker   . Smokeless tobacco: Never Used  . Alcohol Use: Yes     Comment: rarely      Colonoscopy: 12/28/2004  PAP:  Bone density:  Lipid panel:  Allergies  Allergen Reactions  . Codeine Nausea And Vomiting    Current Outpatient Prescriptions  Medication Sig Dispense Refill  . carvedilol (COREG) 3.125 MG tablet Take 1 tablet (3.125 mg total) by mouth 2 (two) times daily. (Patient not taking: Reported on 03/11/2016) 60 tablet 3  . cholestyramine (QUESTRAN) 4 g packet Take 4 g in slurry twice  daily as needed for diarrhea (Patient not taking: Reported on 02/28/2016) 60 each 12  . clobetasol cream (TEMOVATE) 4.91 % Apply 1 application topically 2 (two) times daily. Reported on 03/22/2016  4  . dexamethasone (DECADRON) 4 MG tablet Take 2 tablets (8 mg total) by mouth 2 (two) times daily. Start the day before Taxotere. Then again the day after chemo for 3 days. 60 tablet 0  . ibuprofen (ADVIL,MOTRIN) 200 MG tablet Take 200 mg by mouth every 6 (six) hours as needed for fever or mild pain. Reported on 03/22/2016    . LORazepam (ATIVAN) 0.5 MG tablet Take 0.5 mg by mouth daily. Reported on 03/22/2016    . ondansetron (ZOFRAN) 8 MG tablet Take 1 tablet (8 mg total) by mouth 2 (two) times daily. Start the day after chemo for 3 days. Then take as needed for nausea or vomiting. (Patient not taking: Reported on 03/22/2016) 30 tablet 1  . promethazine (PHENERGAN) 25 MG suppository Place 25 mg rectally  daily as needed. Reported on 04/01/2016  0   No current facility-administered medications for this visit.    OBJECTIVE: Middle-aged white woman who appears stated age 32 Vitals:   04/08/16 0909  BP: 106/76  Pulse: 118  Temp: 98.7 F (37.1 C)  Resp: 18     Body mass index is 33.52 kg/(m^2).    ECOG FS:1 - Symptomatic but completely ambulatory  Skin: warm, dry  HEENT: sclerae anicteric, conjunctivae pink, oropharynx clear. No thrush or mucositis.  Lymph Nodes: No cervical or supraclavicular lymphadenopathy  Lungs: clear to auscultation bilaterally, no rales, wheezes, or rhonci  Heart: regular rate and rhythm  Abdomen: round, soft, non tender, positive bowel sounds  Musculoskeletal: No focal spinal tenderness, no peripheral edema  Neuro: non focal, well oriented, positive affect  Breasts: deferred  LAB RESULTS:  CMP     Component Value Date/Time   NA 142 04/01/2016 0832   K 3.6 04/01/2016 0832   CO2 26 04/01/2016 0832   GLUCOSE 155* 04/01/2016 0832   BUN 22.2 04/01/2016 0832   CREATININE  1.0 04/01/2016 0832   CALCIUM 9.9 04/01/2016 0832   PROT 7.0 04/01/2016 0832   ALBUMIN 3.9 04/01/2016 0832   AST 20 04/01/2016 0832   ALT 26 04/01/2016 0832   ALKPHOS 63 04/01/2016 0832   BILITOT 1.53* 04/01/2016 0832    INo results found for: SPEP, UPEP  Lab Results  Component Value Date   WBC 7.7 04/08/2016   NEUTROABS 6.2 04/08/2016   HGB 10.9* 04/08/2016   HCT 32.4* 04/08/2016   MCV 88.5 04/08/2016   PLT 92* 04/08/2016      Chemistry      Component Value Date/Time   NA 142 04/01/2016 0832   K 3.6 04/01/2016 0832   CO2 26 04/01/2016 0832   BUN 22.2 04/01/2016 0832   CREATININE 1.0 04/01/2016 0832      Component Value Date/Time   CALCIUM 9.9 04/01/2016 0832   ALKPHOS 63 04/01/2016 0832   AST 20 04/01/2016 0832   ALT 26 04/01/2016 0832   BILITOT 1.53* 04/01/2016 0832       No results found for: LABCA2  No components found for: LABCA125  No results for input(s): INR in the last 168 hours.  Urinalysis    Component Value Date/Time   BILIRUBINUR neg 12/29/2015 1014   PROTEINUR pos 12/29/2015 1014   UROBILINOGEN negative 12/29/2015 1014   NITRITE neg 12/29/2015 1014   LEUKOCYTESUR Negative 12/29/2015 1014   STUDIES: Dg Chest 2 View  03/19/2016  CLINICAL DATA:  Right breast cancer, fever EXAM: CHEST  2 VIEW COMPARISON:  12/11/2015 FINDINGS: Cardiomediastinal silhouette is unremarkable. No acute infiltrate or pleural effusion. No pulmonary edema. There is right IJ Port-A-Cath with tip in distal SVC. Osteopenia and mild degenerative changes thoracic spine. IMPRESSION: Right IJ Port-A-Cath in place. No pneumothorax. No active disease. Osteopenia and mild degenerative changes thoracic spine. Electronically Signed   By: Lahoma Crocker M.D.   On: 03/19/2016 08:44     ASSESSMENT: 65 y.o. [place] woman status post right breast lower outer quadrant and right axillary lymph node biopsy 11/16/2015, also positive for a clinical  T1c pN1, stage IIA invasive ductal carcinoma  grade 3, essentially estrogen and progesterone receptor negative but HER-2 amplified, with an MIB-1 of 30%  (a) the breast lesion had moderate positivity for the estrogen receptor at 10%; the axillary lymph node lesion was estrogen receptor negative. Both were progesterone receptor negative  (1) neoadjuvant chemotherapy with carboplatin, docetaxel,  trastuzumab and pertuzumab for 6 cycles, started 12/18/2015  (a) docetaxel switched to gemcitabine for cycles 4-6 because of progressive neuropathy  (b) pertuzumab dose cut in half with cycle 4 due to excessive diarrhea, resumed full dose cycle 5  (2) trastuzumab to be continued to total one year  (a) baseline echocardiogram on 12/12/15 showed an EF of 60-65%  (3) breast conserving surgery with consideration of the Alliance trial to follow  (4) adjuvant radiation as appropriate  PLAN: Terri Mayer performed well with this last cycle of treatment. The labs were reviewed in detail. Her bilirubin has elevated significantly to 2.75. I consulted with Dr. Jana Hakim, and as we have completed chemotherapy at this point, he is not concerned. This level should trend back down with time.   Terri Mayer is scheduled for a mammogram next Tuesday, and will follow up with Dr. Donne Hazel shortly after that. She will return in late June for follow up with Dr. Jana Hakim after surgery. She understands and agrees with this plan. She know the goal of treatment in her case is cure. She has been encouraged to call with any issues that might arise before her next visit here.   Laurie Panda, NP   04/08/2016 9:34 AM

## 2016-04-09 ENCOUNTER — Other Ambulatory Visit: Payer: Self-pay | Admitting: Nurse Practitioner

## 2016-04-16 ENCOUNTER — Ambulatory Visit
Admission: RE | Admit: 2016-04-16 | Discharge: 2016-04-16 | Disposition: A | Discharge status: Home / Self Care | Payer: PPO | Source: Ambulatory Visit | Attending: Nurse Practitioner | Admitting: Nurse Practitioner

## 2016-04-16 ENCOUNTER — Other Ambulatory Visit: Payer: Self-pay | Admitting: Oncology

## 2016-04-16 DIAGNOSIS — C50211 Malignant neoplasm of upper-inner quadrant of right female breast: Secondary | ICD-10-CM

## 2016-04-16 DIAGNOSIS — Z853 Personal history of malignant neoplasm of breast: Secondary | ICD-10-CM | POA: Diagnosis not present

## 2016-04-16 MED ORDER — GADOBENATE DIMEGLUMINE 529 MG/ML IV SOLN
19.0000 mL | Freq: Once | INTRAVENOUS | Status: AC | PRN
  Administered 2016-04-16: 19 mL via INTRAVENOUS

## 2016-04-18 ENCOUNTER — Other Ambulatory Visit: Payer: Self-pay | Admitting: General Surgery

## 2016-04-18 ENCOUNTER — Encounter (HOSPITAL_BASED_OUTPATIENT_CLINIC_OR_DEPARTMENT_OTHER): Payer: Self-pay | Admitting: *Deleted

## 2016-04-18 ENCOUNTER — Encounter (HOSPITAL_BASED_OUTPATIENT_CLINIC_OR_DEPARTMENT_OTHER)
Admission: RE | Admit: 2016-04-18 | Discharge: 2016-04-18 | Disposition: A | Discharge status: Home / Self Care | Payer: PPO | Source: Ambulatory Visit | Attending: General Surgery | Admitting: General Surgery

## 2016-04-18 ENCOUNTER — Other Ambulatory Visit: Payer: Self-pay

## 2016-04-18 DIAGNOSIS — Z9221 Personal history of antineoplastic chemotherapy: Secondary | ICD-10-CM | POA: Diagnosis not present

## 2016-04-18 DIAGNOSIS — R600 Localized edema: Secondary | ICD-10-CM | POA: Diagnosis not present

## 2016-04-18 DIAGNOSIS — N6031 Fibrosclerosis of right breast: Secondary | ICD-10-CM | POA: Diagnosis not present

## 2016-04-18 DIAGNOSIS — Z79899 Other long term (current) drug therapy: Secondary | ICD-10-CM | POA: Diagnosis not present

## 2016-04-18 DIAGNOSIS — Z171 Estrogen receptor negative status [ER-]: Secondary | ICD-10-CM | POA: Diagnosis not present

## 2016-04-18 DIAGNOSIS — Z6833 Body mass index (BMI) 33.0-33.9, adult: Secondary | ICD-10-CM | POA: Diagnosis not present

## 2016-04-18 DIAGNOSIS — C50911 Malignant neoplasm of unspecified site of right female breast: Secondary | ICD-10-CM | POA: Diagnosis not present

## 2016-04-18 DIAGNOSIS — E669 Obesity, unspecified: Secondary | ICD-10-CM | POA: Diagnosis not present

## 2016-04-18 DIAGNOSIS — C50211 Malignant neoplasm of upper-inner quadrant of right female breast: Secondary | ICD-10-CM

## 2016-04-18 DIAGNOSIS — Z853 Personal history of malignant neoplasm of breast: Secondary | ICD-10-CM | POA: Diagnosis not present

## 2016-04-18 DIAGNOSIS — M1990 Unspecified osteoarthritis, unspecified site: Secondary | ICD-10-CM | POA: Diagnosis not present

## 2016-04-18 DIAGNOSIS — Z885 Allergy status to narcotic agent status: Secondary | ICD-10-CM | POA: Diagnosis not present

## 2016-04-18 DIAGNOSIS — Z8249 Family history of ischemic heart disease and other diseases of the circulatory system: Secondary | ICD-10-CM | POA: Diagnosis not present

## 2016-04-19 ENCOUNTER — Ambulatory Visit (HOSPITAL_COMMUNITY)
Admission: RE | Admit: 2016-04-19 | Discharge: 2016-04-19 | Disposition: A | Discharge status: Home / Self Care | Payer: PPO | Source: Ambulatory Visit | Attending: Internal Medicine | Admitting: Internal Medicine

## 2016-04-19 ENCOUNTER — Ambulatory Visit (HOSPITAL_BASED_OUTPATIENT_CLINIC_OR_DEPARTMENT_OTHER)
Admission: RE | Admit: 2016-04-19 | Discharge: 2016-04-19 | Disposition: A | Discharge status: Home / Self Care | Payer: PPO | Source: Ambulatory Visit | Attending: Internal Medicine | Admitting: Internal Medicine

## 2016-04-19 ENCOUNTER — Other Ambulatory Visit (HOSPITAL_COMMUNITY): Payer: Self-pay | Admitting: Internal Medicine

## 2016-04-19 VITALS — BP 122/82 | HR 90 | Resp 18 | Wt 202.0 lb

## 2016-04-19 DIAGNOSIS — Z171 Estrogen receptor negative status [ER-]: Secondary | ICD-10-CM | POA: Insufficient documentation

## 2016-04-19 DIAGNOSIS — M1990 Unspecified osteoarthritis, unspecified site: Secondary | ICD-10-CM | POA: Insufficient documentation

## 2016-04-19 DIAGNOSIS — C50211 Malignant neoplasm of upper-inner quadrant of right female breast: Secondary | ICD-10-CM

## 2016-04-19 DIAGNOSIS — Z6833 Body mass index (BMI) 33.0-33.9, adult: Secondary | ICD-10-CM | POA: Insufficient documentation

## 2016-04-19 DIAGNOSIS — E669 Obesity, unspecified: Secondary | ICD-10-CM | POA: Insufficient documentation

## 2016-04-19 DIAGNOSIS — Z885 Allergy status to narcotic agent status: Secondary | ICD-10-CM | POA: Insufficient documentation

## 2016-04-19 DIAGNOSIS — Z8249 Family history of ischemic heart disease and other diseases of the circulatory system: Secondary | ICD-10-CM | POA: Insufficient documentation

## 2016-04-19 DIAGNOSIS — Z9221 Personal history of antineoplastic chemotherapy: Secondary | ICD-10-CM | POA: Insufficient documentation

## 2016-04-19 DIAGNOSIS — Z79899 Other long term (current) drug therapy: Secondary | ICD-10-CM | POA: Insufficient documentation

## 2016-04-19 DIAGNOSIS — R600 Localized edema: Secondary | ICD-10-CM | POA: Insufficient documentation

## 2016-04-19 MED ORDER — CARVEDILOL 6.25 MG PO TABS: 6.2500 mg | ORAL_TABLET | Freq: Two times a day (BID) | ORAL | Status: DC

## 2016-04-19 MED FILL — CARVEDILOL 6.25 MG TABLET: 6.25 | 30 days supply | Qty: 60 | Fill #0

## 2016-04-19 NOTE — Progress Notes (Signed)
Patient ID: Terri Mayer, female   DOB: 1951/05/07, 65 y.o.   MRN: [id number]    Advanced Heart Failure/Cardio0oncology Note   Referring Physician: Dr Jana Hakim Primary Care: Dr. Bernerd Limbo Primary Cardiologist: New (Dr. 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 ER/PR- and HER-2-neu positive. Referred by Dr. Jana Hakim for enrollment into cardio-oncology clinic.   Breast CA profile: Biopsy of the right breast 8:30 o'clock lesion and the right axillary lymph node on 11/16/2015 showed ([id number]) both to be positive for invasive ductal carcinoma, grade 3, the breast mass being estrogen receptor 10% positive with moderate staining intensity, but progesterone receptor negative, but the lymph node being estrogen and progesterone both negative. Both the breast and lymph nodes had MIB-1 of 30%. Both were positive for HER-2 amplification, the signals ratio is being 6.06 and 7.61, and the number per cell 10.00 and 11.80.   She presents today for cardio-oncology follow up.   At last visit she had completed cycle 4 of 6. Her EF was mildly reduced but still within normal limits.  We decided to have her finish cycles 5 and 6 and return for Echo. We added Coreg, but she only took for a couple of weeks and then stopped. She has had some complications from chemo, including nausea and anemia. She is due to start Herceptin on Monday, June 5th.    Echo 12/12/15 EF 65%, LS' 10.1   GLS -21.9%, Grade 2 DD Echo 02/28/16 EF 55%, LS' 9.2     GLS -15.9%, Grade 1 DD, Mildly calcified AV Echo 04/19/16 EF 50-55%, LS' 9.1  GLS - 16.9%,    Past Medical History  Diagnosis Date  . History of chicken pox   . Allergy   . Breast cancer ([place])   . Osteoarthritis     Neck,Back,Knees,Hips,Ankles    Current Outpatient Prescriptions  Medication Sig Dispense Refill  . cholestyramine  (QUESTRAN) 4 g packet Take 4 g in slurry twice daily as needed for diarrhea 60 each 12  . clobetasol cream (TEMOVATE) 8.36 % Apply 1 application topically 2 (two) times daily. Reported on 03/22/2016  4  . dexamethasone (DECADRON) 4 MG tablet Take 2 tablets (8 mg total) by mouth 2 (two) times daily. Start the day before Taxotere. Then again the day after chemo for 3 days. 60 tablet 0  . ibuprofen (ADVIL,MOTRIN) 200 MG tablet Take 200 mg by mouth every 6 (six) hours as needed for fever or mild pain. Reported on 03/22/2016    . LORazepam (ATIVAN) 0.5 MG tablet Take 0.5 mg by mouth daily. Reported on 03/22/2016    . ondansetron (ZOFRAN) 8 MG tablet Take 1 tablet (8 mg total) by mouth 2 (two) times daily. Start the day after chemo for 3 days. Then take as needed for nausea or vomiting. 30 tablet 1  . promethazine (PHENERGAN) 25 MG suppository Place 25 mg rectally daily as needed. Reported on 04/01/2016  0  . carvedilol (COREG) 3.125 MG tablet Take 1 tablet (3.125 mg total) by mouth 2 (two) times daily. (Patient not taking: Reported on 04/19/2016) 60 tablet 3   No current facility-administered medications for this encounter.    Allergies  Allergen Reactions  . Codeine Nausea And Vomiting      Social History   Social History  . Marital Status: Single    Spouse Name: N/A  . Number of  Children: 1  . Years of Education: N/A   Occupational History  . dentist    Social History Main Topics  . Smoking status: Never Smoker   . Smokeless tobacco: Never Used  . Alcohol Use: Yes     Comment: rarely   . Drug Use: Not on file  . Sexual Activity: Not on file   Other Topics Concern  . Not on file   Social History Narrative   Single, adopted 1 chil      Family History  Problem Relation Age of Onset  . Heart disease Father   . Diabetes      GM  . Colon cancer Neg Hx   . Breast cancer Neg Hx   . Dementia      M  . Transient ischemic attack      F    Filed Vitals:   04/19/16 1009  BP: 122/82   Pulse: 90  Resp: 18  Weight: 202 lb (91.627 kg)  SpO2: 100%    PHYSICAL EXAM: General:  WDWN. NAD.  HEENT: normal Neck: supple. JVD not elevated. Carotids 2+ bilat; no bruits. No thyromegaly or nodule noted appreciated. Cor: PMI nondisplaced. RRR, No M/G/R appreciated. Port R chest.  Lungs: CTAB, normal effort Abdomen: Obese, soft, NT, ND, no HSM. No bruits or masses. +BS  Extremities: no cyanosis, clubbing, rash. Trace ankle edema.  Neuro: alert & oriented x 3, cranial nerves grossly intact. moves all 4 extremities w/o difficulty. Affect pleasant.   ASSESSMENT & PLAN:   1. Female breast cancer of the right upper-inner quadrant - Has completed 6 of 6 cycles of Carboplatin + Docetaxel + trastuzumab and pertuzamab over same time period. Scheduled to start herceptin q 3 weeks next week.  - Breast conserving surgery to follow. (Consideration for Alliance trial per Dr Jana Hakim note) - Echo 04/19/16 EF 50-55%, LS' 9.1  GLS - 16.9%,  Parameters remain low but stable.   - Start coreg 3.125 mg BID.  2. Peripheral edema - Encouraged to limit sodium and fluid intake - Continue 20 mg lasix as needed for worsening edema.   EF remains slightly down.  Will have her complete 3 rounds of herceptin and follow up in 2 months with Echo.   Shirley Friar, PA-C 04/19/2016   I reviewed echos personally. EF and Doppler parameters on low end of normal but stable. No HF on exam. Will continue Herceptin cautiously. Continue close surveillance. Resume carvedilol as tolerated.   Toddrick Sanna,MD 11:16 PM

## 2016-04-19 NOTE — Progress Notes (Signed)
  Echocardiogram 2D Echocardiogram has been performed.  Jennette Dubin 04/19/2016, 9:49 AM

## 2016-04-19 NOTE — Patient Instructions (Signed)
INCREASE Coreg 6.25 mg, one tab twice a day  Your physician recommends that you schedule a follow-up appointment in: 2 months with echo  Your physician has requested that you have an echocardiogram. Echocardiography is a painless test that uses sound waves to create images of your heart. It provides your doctor with information about the size and shape of your heart and how well your heart's chambers and valves are working. This procedure takes approximately one hour. There are no restrictions for this procedure.  Do the following things EVERYDAY: 1) Weigh yourself in the morning before breakfast. Write it down and keep it in a log. 2) Take your medicines as prescribed 3) Eat low salt foods-Limit salt (sodium) to 2000 mg per day.  4) Stay as active as you can everyday 5) Limit all fluids for the day to less than 2 liters 6)

## 2016-04-22 ENCOUNTER — Ambulatory Visit (HOSPITAL_BASED_OUTPATIENT_CLINIC_OR_DEPARTMENT_OTHER): Payer: PPO

## 2016-04-22 ENCOUNTER — Other Ambulatory Visit (HOSPITAL_BASED_OUTPATIENT_CLINIC_OR_DEPARTMENT_OTHER): Payer: PPO

## 2016-04-22 ENCOUNTER — Other Ambulatory Visit: Payer: Self-pay | Admitting: Oncology

## 2016-04-22 DIAGNOSIS — C50211 Malignant neoplasm of upper-inner quadrant of right female breast: Secondary | ICD-10-CM

## 2016-04-22 DIAGNOSIS — Z5112 Encounter for antineoplastic immunotherapy: Secondary | ICD-10-CM

## 2016-04-22 LAB — COMPREHENSIVE METABOLIC PANEL
ALBUMIN: 3.6 g/dL (ref 3.5–5.0)
ALK PHOS: 76 U/L (ref 40–150)
ALT: 45 U/L (ref 0–55)
AST: 33 U/L (ref 5–34)
Anion Gap: 9 mEq/L (ref 3–11)
BILIRUBIN TOTAL: 1.5 mg/dL — AB (ref 0.20–1.20)
BUN: 14 mg/dL (ref 7.0–26.0)
CO2: 31 meq/L — AB (ref 22–29)
CREATININE: 1.1 mg/dL (ref 0.6–1.1)
Calcium: 9.6 mg/dL (ref 8.4–10.4)
Chloride: 101 mEq/L (ref 98–109)
EGFR: 55 mL/min/{1.73_m2} — AB (ref >90)
GLUCOSE: 107 mg/dL (ref 70–140)
Potassium: 3.7 mEq/L (ref 3.5–5.1)
SODIUM: 141 meq/L (ref 136–145)
TOTAL PROTEIN: 7 g/dL (ref 6.4–8.3)

## 2016-04-22 LAB — CBC WITH DIFFERENTIAL/PLATELET
BASO%: 0.2 % (ref 0.0–2.0)
Basophils Absolute: 0 10*3/uL (ref 0.0–0.1)
EOS ABS: 0.1 10*3/uL (ref 0.0–0.5)
EOS%: 0.6 % (ref 0.0–7.0)
HCT: 32.5 % — ABNORMAL LOW (ref 34.8–46.6)
HEMOGLOBIN: 10.9 g/dL — AB (ref 11.6–15.9)
LYMPH%: 11.8 % — AB (ref 14.0–49.7)
MCH: 30.2 pg (ref 25.1–34.0)
MCHC: 33.7 g/dL (ref 31.5–36.0)
MCV: 89.7 fL (ref 79.5–101.0)
MONO#: 1.3 10*3/uL — AB (ref 0.1–0.9)
MONO%: 12.5 % (ref 0.0–14.0)
NEUT%: 74.9 % (ref 38.4–76.8)
NEUTROS ABS: 7.9 10*3/uL — AB (ref 1.5–6.5)
Platelets: 585 10*3/uL — ABNORMAL HIGH (ref 145–400)
RBC: 3.62 10*6/uL — AB (ref 3.70–5.45)
RDW: 17.5 % — AB (ref 11.2–14.5)
WBC: 10.6 10*3/uL — AB (ref 3.9–10.3)
lymph#: 1.3 10*3/uL (ref 0.9–3.3)

## 2016-04-22 MED ORDER — SODIUM CHLORIDE 0.9% FLUSH
10.0000 mL | INTRAVENOUS | Status: DC | PRN
  Administered 2016-04-22: 10 mL
  Filled 2016-04-22: qty 10

## 2016-04-22 MED ORDER — HEPARIN SOD (PORK) LOCK FLUSH 100 UNIT/ML IV SOLN
500.0000 [IU] | Freq: Once | INTRAVENOUS | Status: AC | PRN
  Administered 2016-04-22: 500 [IU]
  Filled 2016-04-22: qty 5

## 2016-04-22 MED ORDER — SODIUM CHLORIDE 0.9 % IV SOLN
Freq: Once | INTRAVENOUS | Status: AC
  Administered 2016-04-22: 13:00:00 via INTRAVENOUS

## 2016-04-22 MED ORDER — DIPHENHYDRAMINE HCL 25 MG PO CAPS
ORAL_CAPSULE | ORAL | Status: AC
  Filled 2016-04-22: qty 1

## 2016-04-22 MED ORDER — DIPHENHYDRAMINE HCL 25 MG PO CAPS
25.0000 mg | ORAL_CAPSULE | Freq: Once | ORAL | Status: AC
  Administered 2016-04-22: 25 mg via ORAL

## 2016-04-22 MED ORDER — SODIUM CHLORIDE 0.9 % IV SOLN
6.0000 mg/kg | Freq: Once | INTRAVENOUS | Status: AC
  Administered 2016-04-22: 567 mg via INTRAVENOUS
  Filled 2016-04-22: qty 27

## 2016-04-22 MED ORDER — ACETAMINOPHEN 325 MG PO TABS
650.0000 mg | ORAL_TABLET | Freq: Once | ORAL | Status: AC
  Administered 2016-04-22: 650 mg via ORAL

## 2016-04-22 MED ORDER — ACETAMINOPHEN 325 MG PO TABS
ORAL_TABLET | ORAL | Status: AC
  Filled 2016-04-22: qty 2

## 2016-04-26 ENCOUNTER — Other Ambulatory Visit: Payer: PPO

## 2016-04-26 ENCOUNTER — Ambulatory Visit: Payer: PPO | Admitting: Oncology

## 2016-04-28 ENCOUNTER — Telehealth: Payer: Self-pay | Admitting: Hematology

## 2016-04-28 NOTE — Telephone Encounter (Signed)
Pt called for nausea, vomiting since yesterday, and epigastric pain, intermittent, moderate to severe. Last BM 2 days ago, no history of constipation, no fever or chills. She took compazine last night, I recommend her to go to [facility] ED, she is very reluctant, wants to try nausea medication and colace first, but agree to go to ED if no improvement later today.  I reviewed her meds, and suggested her to take zofran, and compazine alternatively every 3-4 hours, and try miralax if no BM. She wants a call from Dr. Jana Hakim tomorrow, she is concerned if she can have the surgery next week.   Truitt Merle  04/28/2016

## 2016-04-30 ENCOUNTER — Ambulatory Visit
Admission: RE | Admit: 2016-04-30 | Discharge: 2016-04-30 | Disposition: A | Discharge status: Home / Self Care | Payer: PPO | Source: Ambulatory Visit | Attending: General Surgery | Admitting: General Surgery

## 2016-04-30 DIAGNOSIS — R590 Localized enlarged lymph nodes: Secondary | ICD-10-CM | POA: Diagnosis not present

## 2016-04-30 DIAGNOSIS — C50911 Malignant neoplasm of unspecified site of right female breast: Secondary | ICD-10-CM | POA: Diagnosis not present

## 2016-04-30 DIAGNOSIS — C50211 Malignant neoplasm of upper-inner quadrant of right female breast: Secondary | ICD-10-CM

## 2016-05-02 ENCOUNTER — Encounter (HOSPITAL_BASED_OUTPATIENT_CLINIC_OR_DEPARTMENT_OTHER): Payer: Self-pay

## 2016-05-02 ENCOUNTER — Ambulatory Visit (HOSPITAL_BASED_OUTPATIENT_CLINIC_OR_DEPARTMENT_OTHER)
Admission: RE | Admit: 2016-05-02 | Discharge: 2016-05-02 | Disposition: A | Discharge status: Home / Self Care | Payer: PPO | Source: Ambulatory Visit | Attending: General Surgery | Admitting: General Surgery

## 2016-05-02 ENCOUNTER — Ambulatory Visit (HOSPITAL_BASED_OUTPATIENT_CLINIC_OR_DEPARTMENT_OTHER): Payer: PPO | Admitting: Anesthesiology

## 2016-05-02 ENCOUNTER — Ambulatory Visit
Admission: RE | Admit: 2016-05-02 | Discharge: 2016-05-02 | Disposition: A | Discharge status: Home / Self Care | Payer: PPO | Source: Ambulatory Visit | Attending: General Surgery | Admitting: General Surgery

## 2016-05-02 ENCOUNTER — Encounter (HOSPITAL_BASED_OUTPATIENT_CLINIC_OR_DEPARTMENT_OTHER)
Admission: RE | Disposition: A | Discharge status: Home / Self Care | Payer: Self-pay | Source: Ambulatory Visit | Attending: General Surgery

## 2016-05-02 ENCOUNTER — Ambulatory Visit (HOSPITAL_COMMUNITY)
Admission: RE | Admit: 2016-05-02 | Discharge: 2016-05-02 | Disposition: A | Discharge status: Home / Self Care | Payer: PPO | Source: Ambulatory Visit | Attending: General Surgery | Admitting: General Surgery

## 2016-05-02 DIAGNOSIS — N6011 Diffuse cystic mastopathy of right breast: Secondary | ICD-10-CM | POA: Diagnosis not present

## 2016-05-02 DIAGNOSIS — R590 Localized enlarged lymph nodes: Secondary | ICD-10-CM | POA: Diagnosis not present

## 2016-05-02 DIAGNOSIS — I888 Other nonspecific lymphadenitis: Secondary | ICD-10-CM | POA: Diagnosis not present

## 2016-05-02 DIAGNOSIS — Z853 Personal history of malignant neoplasm of breast: Secondary | ICD-10-CM | POA: Insufficient documentation

## 2016-05-02 DIAGNOSIS — E669 Obesity, unspecified: Secondary | ICD-10-CM | POA: Diagnosis not present

## 2016-05-02 DIAGNOSIS — Z9221 Personal history of antineoplastic chemotherapy: Secondary | ICD-10-CM | POA: Diagnosis not present

## 2016-05-02 DIAGNOSIS — N6031 Fibrosclerosis of right breast: Secondary | ICD-10-CM | POA: Diagnosis not present

## 2016-05-02 DIAGNOSIS — G8918 Other acute postprocedural pain: Secondary | ICD-10-CM | POA: Diagnosis not present

## 2016-05-02 DIAGNOSIS — C50211 Malignant neoplasm of upper-inner quadrant of right female breast: Secondary | ICD-10-CM

## 2016-05-02 DIAGNOSIS — R079 Chest pain, unspecified: Secondary | ICD-10-CM | POA: Diagnosis not present

## 2016-05-02 DIAGNOSIS — N63 Unspecified lump in breast: Secondary | ICD-10-CM | POA: Diagnosis not present

## 2016-05-02 DIAGNOSIS — C50911 Malignant neoplasm of unspecified site of right female breast: Secondary | ICD-10-CM | POA: Diagnosis not present

## 2016-05-02 DIAGNOSIS — M1990 Unspecified osteoarthritis, unspecified site: Secondary | ICD-10-CM | POA: Insufficient documentation

## 2016-05-02 HISTORY — PX: RADIOACTIVE SEED GUIDED PARTIAL MASTECTOMY/AXILLARY SENTINEL NODE BIOPSY/AXILLARY NODE DISSECTION: SHX6491

## 2016-05-02 SURGERY — RADIOACTIVE SEED GUIDED PARTIAL MASTECTOMY WITH AXILLARY SENTINEL LYMPH NODE BIOPSY AND AXILLARY LYMPH NODE DISSECTION
Anesthesia: General | Site: Breast | Laterality: Right

## 2016-05-02 MED ORDER — GLYCOPYRROLATE 0.2 MG/ML IJ SOLN: 0.2000 mg | Freq: Once | INTRAMUSCULAR | Status: DC | PRN

## 2016-05-02 MED ORDER — MIDAZOLAM HCL 2 MG/2ML IJ SOLN
INTRAMUSCULAR | Status: AC
  Filled 2016-05-02: qty 2

## 2016-05-02 MED ORDER — TECHNETIUM TC 99M SULFUR COLLOID FILTERED
1.0000 | Freq: Once | INTRAVENOUS | Status: AC | PRN
  Administered 2016-05-02: 1 via INTRADERMAL

## 2016-05-02 MED ORDER — SUCCINYLCHOLINE CHLORIDE 20 MG/ML IJ SOLN
INTRAMUSCULAR | Status: DC | PRN
  Administered 2016-05-02 (×2): 100 mg via INTRAVENOUS

## 2016-05-02 MED ORDER — LIDOCAINE 2% (20 MG/ML) 5 ML SYRINGE
INTRAMUSCULAR | Status: AC
  Filled 2016-05-02: qty 5

## 2016-05-02 MED ORDER — ONDANSETRON HCL 4 MG/2ML IJ SOLN
INTRAMUSCULAR | Status: AC
  Filled 2016-05-02: qty 2

## 2016-05-02 MED ORDER — BUPIVACAINE HCL (PF) 0.25 % IJ SOLN
INTRAMUSCULAR | Status: DC | PRN
  Administered 2016-05-02: 7 mL

## 2016-05-02 MED ORDER — ESMOLOL HCL 100 MG/10ML IV SOLN
INTRAVENOUS | Status: DC | PRN
  Administered 2016-05-02: 20 mg via INTRAVENOUS

## 2016-05-02 MED ORDER — FENTANYL CITRATE (PF) 100 MCG/2ML IJ SOLN
25.0000 ug | INTRAMUSCULAR | Status: DC | PRN
  Administered 2016-05-02: 25 ug via INTRAVENOUS

## 2016-05-02 MED ORDER — PHENYLEPHRINE HCL 10 MG/ML IJ SOLN
INTRAMUSCULAR | Status: DC | PRN
  Administered 2016-05-02 (×2): 80 ug via INTRAVENOUS

## 2016-05-02 MED ORDER — PHENYLEPHRINE HCL 10 MG/ML IJ SOLN
10.0000 mg | INTRAVENOUS | Status: DC | PRN
  Administered 2016-05-02: 40 ug/min via INTRAVENOUS

## 2016-05-02 MED ORDER — SUCCINYLCHOLINE CHLORIDE 200 MG/10ML IV SOSY
PREFILLED_SYRINGE | INTRAVENOUS | Status: AC
  Filled 2016-05-02: qty 10

## 2016-05-02 MED ORDER — PHENYLEPHRINE 40 MCG/ML (10ML) SYRINGE FOR IV PUSH (FOR BLOOD PRESSURE SUPPORT)
PREFILLED_SYRINGE | INTRAVENOUS | Status: AC
  Filled 2016-05-02: qty 10

## 2016-05-02 MED ORDER — LIDOCAINE HCL (CARDIAC) 20 MG/ML IV SOLN
INTRAVENOUS | Status: DC | PRN
  Administered 2016-05-02: 50 mg via INTRAVENOUS
  Administered 2016-05-02: 100 mg via INTRAVENOUS

## 2016-05-02 MED ORDER — BUPIVACAINE HCL (PF) 0.25 % IJ SOLN
INTRAMUSCULAR | Status: AC
  Filled 2016-05-02: qty 120

## 2016-05-02 MED ORDER — DEXAMETHASONE SODIUM PHOSPHATE 4 MG/ML IJ SOLN
INTRAMUSCULAR | Status: DC | PRN
  Administered 2016-05-02: 10 mg via INTRAVENOUS

## 2016-05-02 MED ORDER — FENTANYL CITRATE (PF) 100 MCG/2ML IJ SOLN
50.0000 ug | INTRAMUSCULAR | Status: AC | PRN
  Administered 2016-05-02 (×4): 50 ug via INTRAVENOUS

## 2016-05-02 MED ORDER — FENTANYL CITRATE (PF) 100 MCG/2ML IJ SOLN
INTRAMUSCULAR | Status: AC
  Filled 2016-05-02: qty 2

## 2016-05-02 MED ORDER — MIDAZOLAM HCL 2 MG/2ML IJ SOLN
1.0000 mg | INTRAMUSCULAR | Status: DC | PRN
  Administered 2016-05-02: 2 mg via INTRAVENOUS
  Administered 2016-05-02 (×2): 1 mg via INTRAVENOUS

## 2016-05-02 MED ORDER — BUPIVACAINE-EPINEPHRINE (PF) 0.5% -1:200000 IJ SOLN
INTRAMUSCULAR | Status: DC | PRN
  Administered 2016-05-02: 30 mL via PERINEURAL

## 2016-05-02 MED ORDER — EPHEDRINE 5 MG/ML INJ
INTRAVENOUS | Status: AC
  Filled 2016-05-02: qty 10

## 2016-05-02 MED ORDER — ATROPINE SULFATE 1 MG/ML IJ SOLN
INTRAMUSCULAR | Status: AC
  Filled 2016-05-02: qty 1

## 2016-05-02 MED ORDER — SCOPOLAMINE 1 MG/3DAYS TD PT72: 1.0000 | MEDICATED_PATCH | Freq: Once | TRANSDERMAL | Status: DC | PRN

## 2016-05-02 MED ORDER — METHYLENE BLUE 0.5 % INJ SOLN
INTRAVENOUS | Status: AC
  Filled 2016-05-02: qty 40

## 2016-05-02 MED ORDER — HYDROCODONE-ACETAMINOPHEN 10-325 MG PO TABS: 1.0000 | ORAL_TABLET | Freq: Four times a day (QID) | ORAL | Status: DC | PRN

## 2016-05-02 MED ORDER — CEFAZOLIN SODIUM-DEXTROSE 2-4 GM/100ML-% IV SOLN
2.0000 g | INTRAVENOUS | Status: AC
  Administered 2016-05-02: 2 g via INTRAVENOUS

## 2016-05-02 MED ORDER — ONDANSETRON HCL 4 MG/2ML IJ SOLN
INTRAMUSCULAR | Status: DC | PRN
  Administered 2016-05-02: 4 mg via INTRAVENOUS

## 2016-05-02 MED ORDER — PROPOFOL 10 MG/ML IV BOLUS
INTRAVENOUS | Status: DC | PRN
  Administered 2016-05-02: 150 mg via INTRAVENOUS

## 2016-05-02 MED ORDER — DEXAMETHASONE SODIUM PHOSPHATE 10 MG/ML IJ SOLN
INTRAMUSCULAR | Status: AC
  Filled 2016-05-02: qty 1

## 2016-05-02 MED ORDER — CEFAZOLIN SODIUM-DEXTROSE 2-4 GM/100ML-% IV SOLN
INTRAVENOUS | Status: AC
  Filled 2016-05-02: qty 100

## 2016-05-02 MED ORDER — ONDANSETRON HCL 4 MG/2ML IJ SOLN: 4.0000 mg | Freq: Once | INTRAMUSCULAR | Status: DC | PRN

## 2016-05-02 MED ORDER — LACTATED RINGERS IV SOLN
INTRAVENOUS | Status: DC
  Administered 2016-05-02 (×2): via INTRAVENOUS

## 2016-05-02 SURGICAL SUPPLY — 60 items
APPLIER CLIP 9.375 MED OPEN (MISCELLANEOUS) ×3
APR CLP MED 9.3 20 MLT OPN (MISCELLANEOUS) ×1
BINDER BREAST LRG (GAUZE/BANDAGES/DRESSINGS) IMPLANT
BINDER BREAST MEDIUM (GAUZE/BANDAGES/DRESSINGS) IMPLANT
BINDER BREAST XLRG (GAUZE/BANDAGES/DRESSINGS) ×2 IMPLANT
BINDER BREAST XXLRG (GAUZE/BANDAGES/DRESSINGS) IMPLANT
BLADE SURG 15 STRL LF DISP TIS (BLADE) ×1 IMPLANT
BLADE SURG 15 STRL SS (BLADE) ×3
CANISTER SUC SOCK COL 7IN (MISCELLANEOUS) IMPLANT
CANISTER SUCT 1200ML W/VALVE (MISCELLANEOUS) IMPLANT
CHLORAPREP W/TINT 26ML (MISCELLANEOUS) ×3 IMPLANT
CLIP APPLIE 9.375 MED OPEN (MISCELLANEOUS) IMPLANT
CLOSURE WOUND 1/2 X4 (GAUZE/BANDAGES/DRESSINGS) ×1
COVER BACK TABLE 60X90IN (DRAPES) ×3 IMPLANT
COVER MAYO STAND STRL (DRAPES) ×3 IMPLANT
COVER PROBE W GEL 5X96 (DRAPES) ×3 IMPLANT
DECANTER SPIKE VIAL GLASS SM (MISCELLANEOUS) IMPLANT
DEVICE DUBIN W/COMP PLATE 8390 (MISCELLANEOUS) ×3 IMPLANT
DRAPE LAPAROSCOPIC ABDOMINAL (DRAPES) ×3 IMPLANT
DRSG TEGADERM 4X4.75 (GAUZE/BANDAGES/DRESSINGS) ×3 IMPLANT
ELECT COATED BLADE 2.86 ST (ELECTRODE) ×3 IMPLANT
ELECT REM PT RETURN 9FT ADLT (ELECTROSURGICAL) ×3
ELECTRODE REM PT RTRN 9FT ADLT (ELECTROSURGICAL) ×1 IMPLANT
GLOVE BIO SURGEON STRL SZ7 (GLOVE) ×6 IMPLANT
GLOVE BIOGEL PI IND STRL 7.5 (GLOVE) ×1 IMPLANT
GLOVE BIOGEL PI INDICATOR 7.5 (GLOVE) ×2
GOWN STRL REUS W/ TWL LRG LVL3 (GOWN DISPOSABLE) ×2 IMPLANT
GOWN STRL REUS W/TWL LRG LVL3 (GOWN DISPOSABLE) ×9
HEMOSTAT ARISTA ABSORB 3G PWDR (MISCELLANEOUS) ×2 IMPLANT
ILLUMINATOR WAVEGUIDE N/F (MISCELLANEOUS) IMPLANT
KIT MARKER MARGIN INK (KITS) IMPLANT
LIGHT WAVEGUIDE WIDE FLAT (MISCELLANEOUS) IMPLANT
LIQUID BAND (GAUZE/BANDAGES/DRESSINGS) ×5 IMPLANT
NDL HYPO 25X1 1.5 SAFETY (NEEDLE) ×1 IMPLANT
NEEDLE HYPO 25X1 1.5 SAFETY (NEEDLE) ×3 IMPLANT
NS IRRIG 1000ML POUR BTL (IV SOLUTION) IMPLANT
PACK BASIN DAY SURGERY FS (CUSTOM PROCEDURE TRAY) ×3 IMPLANT
PENCIL BUTTON HOLSTER BLD 10FT (ELECTRODE) ×3 IMPLANT
SHEET MEDIUM DRAPE 40X70 STRL (DRAPES) IMPLANT
SLEEVE SCD COMPRESS KNEE MED (MISCELLANEOUS) ×3 IMPLANT
SPONGE GAUZE 4X4 12PLY STER LF (GAUZE/BANDAGES/DRESSINGS) ×3 IMPLANT
SPONGE LAP 4X18 X RAY DECT (DISPOSABLE) ×3 IMPLANT
STOCKINETTE IMPERVIOUS LG (DRAPES) IMPLANT
STRIP CLOSURE SKIN 1/2X4 (GAUZE/BANDAGES/DRESSINGS) ×2 IMPLANT
SUT ETHILON 2 0 FS 18 (SUTURE) IMPLANT
SUT MNCRL AB 4-0 PS2 18 (SUTURE) ×5 IMPLANT
SUT MON AB 5-0 PS2 18 (SUTURE) IMPLANT
SUT SILK 2 0 SH (SUTURE) IMPLANT
SUT VIC AB 2-0 SH 27 (SUTURE) ×9
SUT VIC AB 2-0 SH 27XBRD (SUTURE) ×1 IMPLANT
SUT VIC AB 3-0 SH 27 (SUTURE) ×9
SUT VIC AB 3-0 SH 27X BRD (SUTURE) ×1 IMPLANT
SUT VIC AB 5-0 PS2 18 (SUTURE) IMPLANT
SYR CONTROL 10ML LL (SYRINGE) ×3 IMPLANT
TAPE STRIPS DRAPE STRL (GAUZE/BANDAGES/DRESSINGS) ×4 IMPLANT
TOWEL OR 17X24 6PK STRL BLUE (TOWEL DISPOSABLE) ×3 IMPLANT
TOWEL OR NON WOVEN STRL DISP B (DISPOSABLE) ×3 IMPLANT
TUBE CONNECTING 20'X1/4 (TUBING) ×1
TUBE CONNECTING 20X1/4 (TUBING) ×2 IMPLANT
YANKAUER SUCT BULB TIP NO VENT (SUCTIONS) IMPLANT

## 2016-05-02 NOTE — H&P (Signed)
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 Right breast shows possible masses and low right axillary node. left breast is normal. she has no prior breast history. has two cousins with breast cancer. she underwent US that shows at 3 oclock 6 cm from the nipple that measures 1.4x1.2x1.4 cm. adjacent to this mass is a 5 mm node with 2-3 mm of cortical thickness. has normal morphology and normal hilar fat. in 10 oclock position of right breast there is a hypoechoic mass measuring 7x4x8 mm tht is a mm stable mass most likely FA. in right axilla there is an enlarged node measuring 1.8x1.5x1.8 cm node. this is single enlarged node. biopsy was performed of the breast lesion and the node. there was a ribbon clip in breast and a spiral clip in the node. MRI shows right breast with loq mass measuring 1.5x1.4x1.5 cm. 5 mm directly anterior and lateral to this is a 10x9x9 mm intramammary node. In uoq there is a 6x3x7 mm mass corresponds to the mm stable nodule and is likely benign node or FA. the left breast is normal and there is single enlarged axillary node. biopsy of the node shows metastatic carcinoma. the breast tumor is an idc, grade III the is 10T er pos, 0% pr, her2 positive, Ki is 30%. she had port placement and has undergone primary chemotherapy which she has tolerated well. repeat mri shows no significant residual enhancement at the biopsy site. there is unchanged 6x3x7 mm mass, left breast is normal, nodes are all normal now as well.   Problem List/Past Medical Rolm Bookbinder, MD; 04/19/2016 4:36 PM) STAGE II BREAST CANCER, RIGHT (C50.911)  Past Surgical History Rolm Bookbinder, MD; 04/19/2016 4:36 PM) Oral Surgery Breast Biopsy Right.  Allergies (Sonya Bynum, CMA; 04/18/2016 10:13 AM) Codeine Sulfate *ANALGESICS - OPIOID* Vomiting, Nausea.  Medication History Marjean Donna, CMA; 04/18/2016 10:14 AM) Vitamin C  (500MG Tablet, Oral) Active. Vitamin D (Cholecalciferol) (1000UNIT Tablet, Oral) Active. Flexeril (5MG Tablet, Oral) Active. Ibuprofen Active. Multiple Vitamin (Oral) Active. Medications Reconciled Ondansetron HCl (8MG Tablet, Oral as needed) Active.  Social History Rolm Bookbinder, MD; 04/19/2016 4:36 PM) No drug use Tobacco use Never smoker. Alcohol use Occasional alcohol use. Caffeine use Carbonated beverages, Tea.  Vitals (Sonya Bynum CMA; 04/18/2016 10:13 AM) 04/18/2016 10:13 AM Weight: 208 lb Height: 66in Body Surface Area: 2.03 m Body Mass Index: 33.57 kg/m  Temp.: 29F(Temporal)  Pulse: 76 (Regular)  BP: 118/80 (Sitting, Left Arm, Standard)   Physical Exam Rolm Bookbinder MD; 04/19/2016 4:37 PM) General Mental Status-Alert. Orientation-Oriented X3.  Chest and Lung Exam Chest and lung exam reveals -on auscultation, normal breath sounds, no adventitious sounds and normal vocal resonance.  Breast Nipples-No Discharge. Breast Lump-No Palpable Breast Mass.  Cardiovascular Cardiovascular examination reveals -normal heart sounds, regular rate and rhythm with no murmurs.  Lymphatic Head & Neck  General Head & Neck Lymphatics: Bilateral - Description - Normal. Axillary  General Axillary Region: Bilateral - Description - Normal. Note: no [place] adenopathy   Assessment & Plan Rolm Bookbinder MD; 04/19/2016 4:40 PM) STAGE II BREAST CANCER, RIGHT (C50.911) Story: Right breast seed guided lumpectomy, right axillary targeted node dissection we discussed again today lumpectomy vs mastectomy. I think she is good candidate for lumpectomy plus radiation. no indication for mastectomy and we discussed similar survival and local recurrence. we will proceed with seed guided lumpectomy. she has had complete radiologic response. she has also had radiologic and clinical  response in axillary nodes. we discussed options including alnd, alliance trial or  tad. after long conversation we will proceed with tad (sn plus excision of prior biopsied node that will need a seed in it) and enroll in alliance postop if indicated. will plan on doing this in couple weeks as she is about two weeks out from chemo. risks of surgery and recovery discussed

## 2016-05-02 NOTE — Anesthesia Procedure Notes (Addendum)
Anesthesia Regional Block:  Pectoralis block  Pre-Anesthetic Checklist: ,, timeout performed, Correct Patient, Correct Site, Correct Laterality, Correct Procedure, Correct Position, site marked, Risks and benefits discussed,  Surgical consent,  Pre-op evaluation,  At surgeon's request and post-op pain management  Laterality: Right  Prep: chloraprep       Needles:  Injection technique: Single-shot  Needle Type: Echogenic Needle     Needle Length: 9cm 9 cm Needle Gauge: 21 and 21 G    Additional Needles:  Procedures: ultrasound guided (picture in chart) Pectoralis block Narrative:  Injection made incrementally with aspirations every 5 mL.  Performed by: Personally  Anesthesiologist: Catalina Gravel  Additional Notes: No pain on injection. No increased resistance to injection. Injection made in 5cc increments.  Good needle visualization.  Patient tolerated procedure well.   Procedure Name: Intubation Date/Time: 05/02/2016 7:36 AM Performed by: Melynda Ripple D Pre-anesthesia Checklist: Patient identified, Emergency Drugs available, Suction available and Patient being monitored Patient Re-evaluated:Patient Re-evaluated prior to inductionOxygen Delivery Method: Circle system utilized Preoxygenation: Pre-oxygenation with 100% oxygen Intubation Type: IV induction Ventilation: Mask ventilation without difficulty Laryngoscope Size: Mac and 3 Grade View: Grade I Tube type: Oral Tube size: 7.0 mm Number of attempts: 1 Airway Equipment and Method: Stylet and Oral airway Placement Confirmation: ETT inserted through vocal cords under direct vision,  positive ETCO2 and breath sounds checked- equal and bilateral Secured at: 22 cm Tube secured with: Tape Dental Injury: Teeth and Oropharynx as per pre-operative assessment

## 2016-05-02 NOTE — Progress Notes (Signed)
Assisted Dr. Gifford Shave with right, ultrasound guided, pectoralis block. Side rails up, monitors on throughout procedure. See vital signs in flow sheet. Tolerated Procedure well.

## 2016-05-02 NOTE — Anesthesia Postprocedure Evaluation (Signed)
Anesthesia Post Note  Patient: Terri Mayer  Procedure(s) Performed: Procedure(s) (LRB): RIGHT BREAST SEED GUIDED LUMPECTOMY AND RIGHT SEED TARGETED AXILLARY DISSECTION,AND SENTINEL NODE BIOPSY (Right)  Patient location during evaluation: PACU Anesthesia Type: General and Regional Level of consciousness: awake and alert Pain management: pain level controlled Vital Signs Assessment: post-procedure vital signs reviewed and stable Respiratory status: spontaneous breathing, nonlabored ventilation, respiratory function stable and patient connected to nasal cannula oxygen Cardiovascular status: blood pressure returned to baseline and stable Postop Assessment: no signs of nausea or vomiting Anesthetic complications: no    Last Vitals:  Filed Vitals:   05/02/16 1030 05/02/16 1050  BP: 125/85   Pulse: 76   Temp: 35.7 C 36.3 C  Resp: 16     Last Pain:  Filed Vitals:   05/02/16 1051  PainSc: 1                  Catalina Gravel

## 2016-05-02 NOTE — Op Note (Signed)
Preoperative diagnosis: Right breast cancer, clinical stage II, s/p primary chemotherapy Postoperative diagnosis: same as above Procedure: 1.Right breast seed guided lumpectomy 2. Right axillary sn biopsy 3. Right axillary seed guided excision of prior positive node (targeted axillary dissection) Surgeon Dr Serita Grammes Anes general with pectoral block EBL: 50cc Comps none Specimen:  1. Right breast marked with paint 2. Sentinel nodes with highest count of 15 3. Seed containing right axillary node 4. 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 She has received primary chemotherapy and her nodes are clinically negative now.  Her tumor on mri has also disappeared.  We discussed lumpectomy as well as targeted axillary dissection.    Procedure: After informed consent was obtained the patient was taken to the OR. She was injected with technetium in the standard periareolar fashion. She underwent a pectoral block. She was given ancef. SCDs were in place. She was prepped and draped in the standard sterile surgical fashion. A timeout was performed.  I then made an inframammary incision in the right breast. I then used the lighted retractors to tunnel to the seed.I then removed the seed with an attempt to get a clear margin.  I did confirm removal of the clip and seed with mammography.I thought I might be close after viewing mm to a couple margins and I removed additional superior and medial margins which are marked as above.  I placed clips in the cavity. I closed the deep tissue with 2-0 vicryl. The superficial tissue was closedwith 3-0 vicryl and the skin was then closed with 4-0 monocryl Glue and steristrips were applied. I made an axillary incision. I went through the axillary  fascia.I identified the seed containing node first and excised this. This was not the sentinel node.  I confirmed removal of seed with mammography.  I was able to locate what appeared to be a sentinel node with highest count listed above.  There felt like there were several small nodes in this area.  I did place a vicryl suture in a bleeding vessel. There were no more palpableor radioactive nodes present. I obtained hemostasis. I then closed the fascia with 2-0 vicryl. The skin was closed with 3-0 vicryl and 4-0 monocryl. Glue and steristrips were placed A breast binder was placed. She was extubated and transferred to recovery stable

## 2016-05-02 NOTE — Interval H&P Note (Signed)
History and Physical Interval Note:  05/02/2016 6:55 AM  Terri Mayer  has presented today for surgery, with the diagnosis of right breast cancer  The various methods of treatment have been discussed with the patient and family. After consideration of risks, benefits and other options for treatment, the patient has consented to  Procedure(s): RIGHT BREAST SEED GUIDED LUMPECTOMY AND RIGHT SEED TARGETED AXILLARY [place] (Right) as a surgical intervention .  The patient's history has been reviewed, patient examined, no change in status, stable for surgery.  I have reviewed the patient's chart and labs.  Questions were answered to the patient's satisfaction.     Julius Matus

## 2016-05-02 NOTE — Transfer of Care (Signed)
Immediate Anesthesia Transfer of Care Note  Patient: Terri Mayer  Procedure(s) Performed: Procedure(s) with comments: RIGHT BREAST SEED GUIDED LUMPECTOMY AND RIGHT SEED TARGETED AXILLARY DISSECTION,AND SENTINEL NODE BIOPSY (Right) - RIGHT BREAST SEED GUIDED LUMPECTOMY AND RIGHT SEED TARGETED AXILLARY DISSECTION,AND SENTINEL NODE BIOPSY  Patient Location: PACU  Anesthesia Type:GA combined with regional for post-op pain  Level of Consciousness: awake, alert  and oriented  Airway & Oxygen Therapy: Patient Spontanous Breathing and Patient connected to face mask oxygen  Post-op Assessment: Report given to RN and Post -op Vital signs reviewed and stable  Post vital signs: Reviewed and stable  Last Vitals:  Filed Vitals:   05/02/16 0720 05/02/16 0725  BP:    Pulse: 87 85  Temp:    Resp: 14 14    Last Pain: There were no vitals filed for this visit.       Complications: No apparent anesthesia complications

## 2016-05-02 NOTE — Anesthesia Preprocedure Evaluation (Addendum)
Anesthesia Evaluation  Patient identified by MRN, date of birth, ID band Patient awake    Reviewed: Allergy & Precautions, NPO status , Patient's Chart, lab work & pertinent test results, reviewed documented beta blocker date and time   Airway Mallampati: II  TM Distance: >3 FB Neck ROM: Full    Dental  (+) Teeth Intact, Dental Advisory Given   Pulmonary neg pulmonary ROS,    Pulmonary exam normal breath sounds clear to auscultation       Cardiovascular negative cardio ROS Normal cardiovascular exam Rhythm:Regular Rate:Normal  Intermittent palpitations--takes carvedilol   Neuro/Psych negative neurological ROS  negative psych ROS   GI/Hepatic negative GI ROS, Neg liver ROS,   Endo/Other  Obesity   Renal/GU negative Renal ROS     Musculoskeletal  (+) Arthritis , Osteoarthritis,    Abdominal   Peds  Hematology  (+) Blood dyscrasia, anemia ,   Anesthesia Other Findings Day of surgery medications reviewed with the patient.  Right breast cancer  Reproductive/Obstetrics                             Anesthesia Physical Anesthesia Plan  ASA: II  Anesthesia Plan: General   Post-op Pain Management:  Regional for Post-op pain   Induction: Intravenous  Airway Management Planned: Oral ETT  Additional Equipment:   Intra-op Plan:   Post-operative Plan: Extubation in OR  Informed Consent: I have reviewed the patients History and Physical, chart, labs and discussed the procedure including the risks, benefits and alternatives for the proposed anesthesia with the patient or authorized representative who has indicated his/her understanding and acceptance.   Dental advisory given  Plan Discussed with: CRNA  Anesthesia Plan Comments: (Risks/benefits of general anesthesia discussed with patient including risk of damage to teeth, lips, gum, and tongue, nausea/vomiting, allergic reactions to  medications, and the possibility of heart attack, stroke and death.  All patient questions answered.  Patient wishes to proceed.  ETT for +Nausea/vomiting this AM Right PECS block)       Anesthesia Quick Evaluation

## 2016-05-02 NOTE — Discharge Instructions (Signed)
[facility] °Office Phone Number [phone] ° °POST OP INSTRUCTIONS ° °Always review your discharge instruction sheet given to you by the facility where your surgery was performed. ° °IF YOU HAVE DISABILITY OR FAMILY LEAVE FORMS, YOU MUST BRING THEM TO THE OFFICE FOR PROCESSING.  DO NOT GIVE THEM TO YOUR DOCTOR. ° °1. A prescription for pain medication may be given to you upon discharge.  Take your pain medication as prescribed, if needed.  If narcotic pain medicine is not needed, then you may take acetaminophen (Tylenol), naprosyn (Alleve) or ibuprofen (Advil) as needed. °2. Take your usually prescribed medications unless otherwise directed °3. If you need a refill on your pain medication, please contact your pharmacy.  They will contact our office to request authorization.  Prescriptions will not be filled after 5pm or on week-ends. °4. You should eat very light the first 24 hours after surgery, such as soup, crackers, pudding, etc.  Resume your normal diet the day after surgery. °5. Most patients will experience some swelling and bruising in the breast.  Ice packs and a good support bra will help.  Wear the breast binder provided or a sports bra for 72 hours day and night.  After that wear a sports bra during the day until you return to the office. Swelling and bruising can take several days to resolve.  °6. It is common to experience some constipation if taking pain medication after surgery.  Increasing fluid intake and taking a stool softener will usually help or prevent this problem from occurring.  A mild laxative (Milk of Magnesia or Miralax) should be taken according to package directions if there are no bowel movements after 48 hours. °7. Unless discharge instructions indicate otherwise, you may remove your bandages 48 hours after surgery and you may shower at that time.  You may have steri-strips (small skin tapes) in place directly over the incision.  These strips should be left on the  skin for 7-10 days and will come off on their own.  If your surgeon used skin glue on the incision, you may shower in 24 hours.  The glue will flake off over the next 2-3 weeks.  Any sutures or staples will be removed at the office during your follow-up visit. °8. ACTIVITIES:  You may resume regular daily activities (gradually increasing) beginning the next day.  Wearing a good support bra or sports bra minimizes pain and swelling.  You may have sexual intercourse when it is comfortable. °a. You may drive when you no longer are taking prescription pain medication, you can comfortably wear a seatbelt, and you can safely maneuver your car and apply brakes. °b. RETURN TO WORK:  ______________________________________________________________________________________ °9. You should see your doctor in the office for a follow-up appointment approximately two weeks after your surgery.  Your doctor’s nurse will typically make your follow-up appointment when she calls you with your pathology report.  Expect your pathology report 3-4 business days after your surgery.  You may call to check if you do not hear from us after three days. °10. OTHER INSTRUCTIONS: _______________________________________________________________________________________________ _____________________________________________________________________________________________________________________________________ °_____________________________________________________________________________________________________________________________________ °_____________________________________________________________________________________________________________________________________ ° °WHEN TO CALL DR WAKEFIELD: °1. Fever over 101.0 °2. Nausea and/or vomiting. °3. Extreme swelling or bruising. °4. Continued bleeding from incision. °5. Increased pain, redness, or drainage from the incision. ° °The clinic staff is available to answer your questions during regular  business hours.  Please don’t hesitate to call and ask to speak to one of the nurses for clinical concerns.  If   you have a medical emergency, go to the nearest emergency room or call 911.  A surgeon from [facility] is always on call at the hospital. ° °For further questions, please visit [email or web address] ° ° ° °Post Anesthesia Home Care Instructions ° °Activity: °Get plenty of rest for the remainder of the day. A responsible adult should stay with you for 24 hours following the procedure.  °For the next 24 hours, DO NOT: °-Drive a car °-Operate machinery °-Drink alcoholic beverages °-Take any medication unless instructed by your physician °-Make any legal decisions or sign important papers. ° °Meals: °Start with liquid foods such as gelatin or soup. Progress to regular foods as tolerated. Avoid greasy, spicy, heavy foods. If nausea and/or vomiting occur, drink only clear liquids until the nausea and/or vomiting subsides. Call your physician if vomiting continues. ° °Special Instructions/Symptoms: °Your throat may feel dry or sore from the anesthesia or the breathing tube placed in your throat during surgery. If this causes discomfort, gargle with warm salt water. The discomfort should disappear within 24 hours. ° °If you had a scopolamine patch placed behind your ear for the management of post- operative nausea and/or vomiting: ° °1. The medication in the patch is effective for 72 hours, after which it should be removed.  Wrap patch in a tissue and discard in the trash. Wash hands thoroughly with soap and water. °2. You may remove the patch earlier than 72 hours if you experience unpleasant side effects which may include dry mouth, dizziness or visual disturbances. °3. Avoid touching the patch. Wash your hands with soap and water after contact with the patch. °  ° °

## 2016-05-03 ENCOUNTER — Encounter (HOSPITAL_BASED_OUTPATIENT_CLINIC_OR_DEPARTMENT_OTHER): Payer: Self-pay | Admitting: General Surgery

## 2016-05-13 ENCOUNTER — Other Ambulatory Visit: Payer: PPO

## 2016-05-13 ENCOUNTER — Ambulatory Visit: Payer: PPO

## 2016-05-14 ENCOUNTER — Ambulatory Visit (HOSPITAL_BASED_OUTPATIENT_CLINIC_OR_DEPARTMENT_OTHER): Payer: PPO

## 2016-05-14 ENCOUNTER — Other Ambulatory Visit (HOSPITAL_BASED_OUTPATIENT_CLINIC_OR_DEPARTMENT_OTHER): Payer: PPO

## 2016-05-14 ENCOUNTER — Telehealth: Payer: Self-pay | Admitting: Oncology

## 2016-05-14 ENCOUNTER — Ambulatory Visit (HOSPITAL_BASED_OUTPATIENT_CLINIC_OR_DEPARTMENT_OTHER): Payer: PPO | Admitting: Oncology

## 2016-05-14 VITALS — BP 114/79 | HR 94 | Temp 97.6°F | Resp 17 | Ht 66.0 in | Wt 192.5 lb

## 2016-05-14 DIAGNOSIS — Z5112 Encounter for antineoplastic immunotherapy: Secondary | ICD-10-CM

## 2016-05-14 DIAGNOSIS — C50211 Malignant neoplasm of upper-inner quadrant of right female breast: Secondary | ICD-10-CM | POA: Diagnosis not present

## 2016-05-14 DIAGNOSIS — R12 Heartburn: Secondary | ICD-10-CM

## 2016-05-14 DIAGNOSIS — G622 Polyneuropathy due to other toxic agents: Secondary | ICD-10-CM | POA: Diagnosis not present

## 2016-05-14 LAB — COMPREHENSIVE METABOLIC PANEL
ALBUMIN: 3.3 g/dL — AB (ref 3.5–5.0)
ALK PHOS: 111 U/L (ref 40–150)
ALT: 44 U/L (ref 0–55)
ANION GAP: 11 meq/L (ref 3–11)
AST: 27 U/L (ref 5–34)
BUN: 12.6 mg/dL (ref 7.0–26.0)
CALCIUM: 9.8 mg/dL (ref 8.4–10.4)
CHLORIDE: 101 meq/L (ref 98–109)
CO2: 28 mEq/L (ref 22–29)
Creatinine: 1 mg/dL (ref 0.6–1.1)
EGFR: 62 mL/min/{1.73_m2} — ABNORMAL LOW (ref >90)
Glucose: 96 mg/dl (ref 70–140)
POTASSIUM: 3.7 meq/L (ref 3.5–5.1)
Sodium: 141 mEq/L (ref 136–145)
Total Bilirubin: 0.88 mg/dL (ref 0.20–1.20)
Total Protein: 7 g/dL (ref 6.4–8.3)

## 2016-05-14 LAB — CBC WITH DIFFERENTIAL/PLATELET
BASO%: 1.2 % (ref 0.0–2.0)
BASOS ABS: 0.1 10*3/uL (ref 0.0–0.1)
EOS%: 3.8 % (ref 0.0–7.0)
Eosinophils Absolute: 0.3 10*3/uL (ref 0.0–0.5)
HEMATOCRIT: 32.9 % — AB (ref 34.8–46.6)
HGB: 10.9 g/dL — ABNORMAL LOW (ref 11.6–15.9)
LYMPH%: 24.9 % (ref 14.0–49.7)
MCH: 28.9 pg (ref 25.1–34.0)
MCHC: 33.1 g/dL (ref 31.5–36.0)
MCV: 87.2 fL (ref 79.5–101.0)
MONO#: 0.7 10*3/uL (ref 0.1–0.9)
MONO%: 10.8 % (ref 0.0–14.0)
NEUT#: 4.1 10*3/uL (ref 1.5–6.5)
NEUT%: 59.3 % (ref 38.4–76.8)
PLATELETS: 387 10*3/uL (ref 145–400)
RBC: 3.77 10*6/uL (ref 3.70–5.45)
RDW: 16.5 % — ABNORMAL HIGH (ref 11.2–14.5)
WBC: 6.9 10*3/uL (ref 3.9–10.3)
lymph#: 1.7 10*3/uL (ref 0.9–3.3)

## 2016-05-14 MED ORDER — SODIUM CHLORIDE 0.9% FLUSH
10.0000 mL | INTRAVENOUS | Status: DC | PRN
  Administered 2016-05-14: 10 mL
  Filled 2016-05-14: qty 10

## 2016-05-14 MED ORDER — HEPARIN SOD (PORK) LOCK FLUSH 100 UNIT/ML IV SOLN
500.0000 [IU] | Freq: Once | INTRAVENOUS | Status: AC | PRN
  Administered 2016-05-14: 500 [IU]
  Filled 2016-05-14: qty 5

## 2016-05-14 MED ORDER — TRASTUZUMAB CHEMO INJECTION 440 MG
6.0000 mg/kg | Freq: Once | INTRAVENOUS | Status: AC
  Administered 2016-05-14: 567 mg via INTRAVENOUS
  Filled 2016-05-14: qty 27

## 2016-05-14 MED ORDER — OMEPRAZOLE 40 MG PO CPDR: 40.0000 mg | DELAYED_RELEASE_CAPSULE | Freq: Every day | ORAL | Status: DC

## 2016-05-14 MED ORDER — ACETAMINOPHEN 325 MG PO TABS
ORAL_TABLET | ORAL | Status: AC
  Filled 2016-05-14: qty 2

## 2016-05-14 MED ORDER — DIPHENHYDRAMINE HCL 25 MG PO CAPS
ORAL_CAPSULE | ORAL | Status: AC
  Filled 2016-05-14: qty 1

## 2016-05-14 MED ORDER — DIPHENHYDRAMINE HCL 25 MG PO CAPS
25.0000 mg | ORAL_CAPSULE | Freq: Once | ORAL | Status: AC
  Administered 2016-05-14: 25 mg via ORAL

## 2016-05-14 MED ORDER — ACETAMINOPHEN 325 MG PO TABS
650.0000 mg | ORAL_TABLET | Freq: Once | ORAL | Status: AC
  Administered 2016-05-14: 650 mg via ORAL

## 2016-05-14 MED ORDER — SODIUM CHLORIDE 0.9 % IV SOLN
Freq: Once | INTRAVENOUS | Status: AC
  Administered 2016-05-14: 12:00:00 via INTRAVENOUS

## 2016-05-14 MED FILL — OMEPRAZOLE DR 40 MG CAPSULE: 40 | 90 days supply | Qty: 90 | Fill #0

## 2016-05-14 NOTE — Progress Notes (Signed)
[place]  Telephone:([phone]) [phone] Fax:(336) [phone]   ID: Terri Mayer DOB: [birth year]  MR#: [id number]  YTW#:[id number]  Patient Care Team: Terri Koch, MD as PCP - General (Internal Medicine) Terri Cruel, MD as Consulting Physician (Oncology) Terri Bookbinder, MD as Consulting Physician (General Surgery) Terri Silversmith, MD as Consulting Physician (Radiation Oncology) Terri Bruins, MD as Consulting Physician (Obstetrics and Gynecology) Terri Artist, MD as Consulting Physician (Cardiology) PCP: Terri Koch, MD GYN: SU:  OTHER MD:  CHIEF COMPLAINT: right breast cancer  CURRENT TREATMENT: Trastuzumab; awaiting adjuvant radiation  BREAST CANCER HISTORY: From the original intake note:  "Terri Mayer" underwent bilateral screening mammography with tomosynthesis at the breast center 11/02/2015 showing a possible mass in the right breast and low right axillary area. She was called back for right diagnostic mammography with tomosynthesis and right ultrasonography 11/14/2015. The breast density was category B. There was a lobulated mass in the lateral right breast, with a smaller oval mass immediately adjacent to it. Also in the upper outer quadrant there was a third mass. This has been stable back to 2014 and is felt to be a fibroadenoma.  Ultrasound of the right breast confirmed a hypoechoic mass at the 3:00 position 6 cm from the nipple, measuring 1.4 cm. Adjacent to this there was a 0.5 cm internal mammary lymph node, with normal morphology and normal hilar fat. The mass at the 10:00 position measured 0.8 cm and again was consistent with a fibroadenoma. In the right axilla there was an abnormal lymph node with no internal fat measuring 1.8 cm. The rest of the exam was unremarkable.  Biopsy of the right breast 8:30 o'clock lesion and the right axillary lymph node on 11/16/2015 showed ([id number]) both to be positive for invasive ductal  carcinoma, grade 3, the breast mass being estrogen receptor 10% positive with moderate staining intensity, but progesterone receptor negative, but the lymph node being estrogen and progesterone both negative. Both the breast and lymph nodes had MIB-1 of 30%. Both were positive for HER-2 amplification, the signals ratio is being 6.06 and 7.61, and the number per cell 10.00 and 11.80.  The patient's subsequent history is as detailed below   INTERVAL HISTORY: Terri Mayer returns for follow up of her HER-2/neu positive breast cancer accompanied by a friend. Today is day 1, cycle 1 trastuzumab alone, to be repeated every 21 days through January 2018  Since her last visit here Terri Mayer had her right lumpectomy and sentinel lymph node sampling, on 05/02/2016. The final pathology ([id number]) showed a complete pathologic response, with no residual invasive or noninvasive disease in the breast and all 5 sentinel lymph nodes as well as 1 non-sentinel lymph node clear.  REVIEW OF SYSTEMS: Terri Mayer did well with her surgery, although she did have some pain. She has been very active, carrying some heavy materials, and slightly injured her right shoulder doing that. She has some numbness in the right axilla as well. She has shooting pains around the nipple where she had the injection. Of course she has some slight heartburn problems which are fairly chronic with her. She has had 3 episodes of fairly intense upper abdominal pain, like a band across the front of her upper abdomen. This happened a few hours after eating watermelon, for some reason, most recently. She also remains nauseated. She is a little bit more constipated than she used to be. Also she still has some peripheral neuropathy particularly involving the feet. Aside from this a  detailed review of systems today was noncontributory  PAST MEDICAL HISTORY: Past Medical History  Diagnosis Date  . History of chicken pox   . Allergy   . Breast cancer ([place])   .  Osteoarthritis     Neck,Back,Knees,Hips,Ankles    PAST SURGICAL HISTORY: Past Surgical History  Procedure Laterality Date  . Eye surgery  at age 51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  . Wisdom tooth extraction    . Dilation and curettage of uterus    . Portacath placement Right 12/11/2015    Procedure: INSERTION PORT-A-CATH WITH ULTRASOUND;  Surgeon: Terri Bookbinder, MD;  Location: [facility];  Service: General;  Laterality: Right;  . Radioactive seed guided partial mastectomy/axillary sentinel node biopsy/axillary node dissection Right 05/02/2016    Procedure: RIGHT BREAST SEED GUIDED LUMPECTOMY AND RIGHT SEED TARGETED AXILLARY DISSECTION,AND SENTINEL NODE BIOPSY;  Surgeon: Terri Bookbinder, MD;  Location: [facility];  Service: General;  Laterality: Right;  RIGHT BREAST SEED GUIDED LUMPECTOMY AND RIGHT SEED TARGETED AXILLARY DISSECTION,AND SENTINEL NODE BIOPSY    FAMILY HISTORY Family History  Problem Relation Age of Onset  . Heart disease Father   . Diabetes      GM  . Colon cancer Neg Hx   . Breast cancer Neg Hx   . Dementia      M  . Transient ischemic attack      F   the patient's father died after multiple strokes at the age of 51. The patient's mother died at the age of 51 with Alzheimer's disease. The patient had one brother, one sister. The patient's paternal grandfather died from lung cancer in his 64s. On the mother's side there are 2 (second) cousins with breast cancer diagnosed in their early 78s, and a more remote cousin diagnosed with breast cancer at age 29. There is no history of ovarian cancer.   GYNECOLOGIC HISTORY:  No LMP recorded. Patient is postmenopausal.  menarche age 75, the patient is GX P0 and she understands that not carrying a child to term before the age of 8 essentially doubled the risk of breast cancer. She stopped having periods between age 81 and 67 and did not take hormone replacement. 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 She lives by herself, with her pet dog. Her son, adopted at birth, Terri Mayer, currently 20, attends [employer] in the psychology Department.     ADVANCED DIRECTIVES: Not in place. At the initial clinic visit the patient was given the appropriate forms to complete and notarize at her discretion.    HEALTH MAINTENANCE: Social History  Substance Use Topics  . Smoking status: Never Smoker   . Smokeless tobacco: Never Used  . Alcohol Use: Yes     Comment: rarely      Colonoscopy: 12/28/2004  PAP:  Bone density:  Lipid panel:  Allergies  Allergen Reactions  . Codeine Nausea And Vomiting    Current Outpatient Prescriptions  Medication Sig Dispense Refill  . carvedilol (COREG) 6.25 MG tablet Take 1 tablet (6.25 mg total) by mouth 2 (two) times daily with a meal. 60 tablet 3  . cholestyramine (QUESTRAN) 4 g packet Take 4 g in slurry twice daily as needed for diarrhea 60 each 12  . clobetasol cream (TEMOVATE) 5.17 % Apply 1 application topically 2 (two) times daily.  Reported on 03/22/2016  4  . dexamethasone (DECADRON) 4 MG tablet Take 2 tablets (8 mg total) by mouth 2 (two) times daily. Start the day before Taxotere. Then again the day after chemo for 3 days. 60 tablet 0  . HYDROcodone-acetaminophen (NORCO) 10-325 MG tablet Take 1 tablet by mouth every 6 (six) hours as needed. 20 tablet 0  . ibuprofen (ADVIL,MOTRIN) 200 MG tablet Take 200 mg by mouth every 6 (six) hours as needed for fever or mild pain. Reported on 03/22/2016    . LORazepam (ATIVAN) 0.5 MG tablet Take 0.5 mg by mouth daily. Reported on 03/22/2016    . ondansetron (ZOFRAN) 8 MG tablet Take 1 tablet (8 mg total) by mouth 2 (two) times daily. Start the day after chemo for 3 days. Then take as needed for nausea or vomiting. 30 tablet 1  . promethazine (PHENERGAN) 25 MG suppository Place 25 mg  rectally daily as needed. Reported on 04/01/2016  0   No current facility-administered medications for this visit.    OBJECTIVE: Middle-aged white woman In no acute distress Wall  Filed Vitals:   05/14/16 1007  BP: 114/79  Pulse: 94  Temp: 97.6 F (36.4 C)  Resp: 17     Body mass index is 31.08 kg/(m^2).    ECOG FS:1 - Symptomatic but completely ambulatory  Sclerae unicteric, pupils round and equal Oropharynx clear and moist-- no thrush or other lesions No cervical or supraclavicular adenopathy Lungs no rales or rhonchi Heart regular rate and rhythm Abd soft, nontender including the palpation in the right upper quadrant, positive bowel sounds, no masses palpated MSK no focal spinal tenderness, no upper extremity lymphedema Neuro: nonfocal, well oriented, appropriate affect Breasts: The right breast is status post recent lumpectomy. The cosmetic result is excellent. There is minimal swelling but no erythema in the right axilla. The right breast incision is healing nicely, with Steri-Strips still in place the left breast is unremarkable.    LAB RESULTS:  CMP     Component Value Date/Time   NA 141 05/14/2016 0947   K 3.7 05/14/2016 0947   CO2 28 05/14/2016 0947   GLUCOSE 96 05/14/2016 0947   BUN 12.6 05/14/2016 0947   CREATININE 1.0 05/14/2016 0947   CALCIUM 9.8 05/14/2016 0947   PROT 7.0 05/14/2016 0947   ALBUMIN 3.3* 05/14/2016 0947   AST 27 05/14/2016 0947   ALT 44 05/14/2016 0947   ALKPHOS 111 05/14/2016 0947   BILITOT 0.88 05/14/2016 0947    INo results found for: SPEP, UPEP  Lab Results  Component Value Date   WBC 6.9 05/14/2016   NEUTROABS 4.1 05/14/2016   HGB 10.9* 05/14/2016   HCT 32.9* 05/14/2016   MCV 87.2 05/14/2016   PLT 387 05/14/2016      Chemistry      Component Value Date/Time   NA 141 05/14/2016 0947   K 3.7 05/14/2016 0947   CO2 28 05/14/2016 0947   BUN 12.6 05/14/2016 0947   CREATININE 1.0 05/14/2016 0947      Component Value  Date/Time   CALCIUM 9.8 05/14/2016 0947   ALKPHOS 111 05/14/2016 0947   AST 27 05/14/2016 0947   ALT 44 05/14/2016 0947   BILITOT 0.88 05/14/2016 0947       No results found for: LABCA2  No components found for: [id number]  No results for input(s): INR in the last 168 hours.  Urinalysis    Component Value Date/Time   BILIRUBINUR neg 12/29/2015 1014   PROTEINUR  pos 12/29/2015 1014   UROBILINOGEN negative 12/29/2015 1014   NITRITE neg 12/29/2015 1014   LEUKOCYTESUR Negative 12/29/2015 1014   STUDIES: Mr Breast Bilateral W Wo Contrast  04/16/2016  CLINICAL DATA:  Patient with history of right breast cancer and metastatic right axillary adenopathy undergoing neoadjuvant chemotherapy. EXAM: BILATERAL BREAST MRI WITH AND WITHOUT CONTRAST TECHNIQUE: Multiplanar, multisequence MR images of both breasts were obtained prior to and following the intravenous administration of 19 ml of MultiHance. THREE-DIMENSIONAL MR IMAGE RENDERING ON INDEPENDENT WORKSTATION: Three-dimensional MR images were rendered by post-processing of the original MR data on an independent workstation. The three-dimensional MR images were interpreted, and findings are reported in the following complete MRI report for this study. Three dimensional images were evaluated at the independent Dyn[employer] workstation COMPARISON:  Previous exam(s). FINDINGS: Breast composition: b.  Scattered fibroglandular tissue. Background parenchymal enhancement: Mild Right breast: Susceptibility artifact from biopsy marking clip is demonstrated within the lateral aspect of the right breast middle depth at the site of previously biopsied right breast carcinoma. No significant residual enhancement is identified at the biopsy site. Slight interval decrease in cortical thickening of 6 mm intramammary lymph node slightly anterior and lateral to the biopsy marking clip. Unchanged 6 x 3 x 7 mm oval circumscribed enhancing mass within the upper-outer quadrant of  the right breast anterior third, corresponding with the mammographically stable mass, most compatible with benign intramammary lymph node. Left breast: No mass or abnormal enhancement. Lymph nodes: Interval resolution of previously described cortically thickened right axillary lymph node which now demonstrates no residual cortical thickening (image 67; series 7). No right axillary adenopathy is identified. Ancillary findings:  None. IMPRESSION: No significant residual enhancement is identified at the site of previously biopsied carcinoma within the lateral right breast. Slight interval decrease in cortical thickening of adjacent intramammary lymph node. Interval resolution of previously described right axillary adenopathy. RECOMMENDATION: Treatment plan. BI-RADS CATEGORY  6: Known biopsy-proven malignancy. Electronically Signed   By: Lovey Newcomer M.D.   On: 04/16/2016 16:39   Nm Sentinel Node Inj-no Rpt (breast)  05/02/2016  CLINICAL DATA: right axillary sn biopsy Sulfur colloid was injected intradermally by the nuclear medicine technologist for breast cancer sentinel node localization.   Mm Breast Surgical Specimen  05/02/2016  CLINICAL DATA:  Patient status post surgical excision right breast mass and right axillary lymph node. EXAM: SPECIMEN RADIOGRAPH OF THE RIGHT BREAST COMPARISON:  Previous exam(s). FINDINGS: Status post excision of the right breast. The radioactive seed and biopsy marker clip are present, completely intact, and were marked for pathology. Status post excision of right axillary lymph node. Radioactive seed biopsy marking clip were present, completely intact and marked for pathology. IMPRESSION: Specimen radiograph of the right breast and right axillary lymph node. Electronically Signed   By: Lovey Newcomer M.D.   On: 05/02/2016 08:25   Mm Rt Radioactive Seed Loc Mammo Guide  04/30/2016  CLINICAL DATA:  Known right breast malignancy. Preoperative localization. EXAM: MAMMOGRAPHIC GUIDED  RADIOACTIVE SEED LOCALIZATION OF THE RIGHT BREAST COMPARISON:  Previous exam(s). FINDINGS: Patient presents for radioactive seed localization prior to surgical excision of right breast malignancy. I met with the patient and we discussed the procedure of seed localization including benefits and alternatives. We discussed the high likelihood of a successful procedure. We discussed the risks of the procedure including infection, bleeding, tissue injury and further surgery. We discussed the low dose of radioactivity involved in the procedure. Informed, written consent was given. The usual time-out protocol was  performed immediately prior to the procedure. Using mammographic guidance, sterile technique, 1% lidocaine and an I-125 radioactive seed, the ribbon shaped clip marking the right breast mass was localized using a lateral approach. The mass is no longer visible and has resolved mammographically following neoadjuvant therapy. The follow-up mammogram images confirm the seed in the expected location and were marked for Dr. Donne Hazel. Follow-up survey of the patient confirms presence of the radioactive seed. Order number of I-125 seed:  [id number]. Total activity:  5.400 millicuries  Reference Date: 04/22/2016 The patient tolerated the procedure well and was released from the [facility]. She was given instructions regarding seed removal. IMPRESSION: Radioactive seed localization right breast. No apparent complications. Electronically Signed   By: Altamese Cabal M.D.   On: 04/30/2016 14:06   [place] Rt Radioactive Seed Ea Add Lesion  04/30/2016  CLINICAL DATA:  Known right breast malignancy. Preoperative localization of the right breast mass and abnormal right axillary lymph node. EXAM: ULTRASOUND GUIDED RADIOACTIVE SEED LOCALIZATION OF THE ABNORMAL RIGHT AXILLARY LYMPH NODE COMPARISON:  Previous exam(s). FINDINGS: Patient presents for radioactive seed localization prior to surgery. I met with the patient and we  discussed the procedure of seed localization including benefits and alternatives. We discussed the high likelihood of a successful procedure. We discussed the risks of the procedure including infection, bleeding, tissue injury and further surgery. We discussed the low dose of radioactivity involved in the procedure. Informed, written consent was given. The usual time-out protocol was performed immediately prior to the procedure. Using ultrasound guidance, sterile technique, 1% lidocaine and an I-125 radioactive seed, the HydroMARK spiral clip was localized using a lateral/inferior approach. The follow-up mammogram images confirm the seed in the expected location and were marked for Dr. Donne Hazel. Follow-up survey of the patient confirms presence of the radioactive seed. Order number of I-125 seed:  [id number]. Total activity: 3.267 millicuries Reference Date: 04/18/2016 The patient tolerated the procedure well and was released from the [place]. She was given instructions regarding seed removal. IMPRESSION: Radioactive seed localization abnormal right axillary lymph node. No apparent complications. Electronically Signed   By: Altamese Cabal M.D.   On: 04/30/2016 14:02     ASSESSMENT: 65 y.o.  woman status post right breast lower outer quadrant and right axillary lymph node biopsy 11/16/2015, also positive for a clinical  T1c pN1, stage IIA invasive ductal carcinoma grade 3, essentially estrogen and progesterone receptor negative but HER-2 amplified, with an MIB-1 of 30%  (a) the breast lesion had moderate positivity for the estrogen receptor at 10%; the axillary lymph node lesion was estrogen receptor negative. Both were progesterone receptor negative  (1) neoadjuvant chemotherapy with carboplatin, docetaxel, trastuzumab and pertuzumab for 6 cycles, started 12/18/2015  (a) docetaxel switched to gemcitabine for cycles 4-6 because of progressive neuropathy  (b) pertuzumab dose cut in half with  cycle 4 due to excessive diarrhea, resumed full dose cycle 5  (2) trastuzumab to be continued to total one year, through January 2018  (a) baseline echocardiogram on 12/12/15 showed an EF of 60-65%  (b) echocardiogram 04/19/2016 showed an ejection fraction in the 50-55% range  (3) right lumpectomy and sentinel lymph node sampling 05/02/2016 showed a complete pathologic response (ypT0, ypN0)  (4) adjuvant radiation to follow   PLAN: Terri Mayer has had the best possible response to her neoadjuvant chemotherapy. A complete pathologic response in an estrogen receptor negative patient predicts a risk of distant recurrence of less than 10% within the next 10 years.  She is  now ready to start radiation. I have placed a referral for her to be seen sometime within the next 2 weeks.  As for the pains that she has been experiencing, from her description the best I can make out is that this is going to be reflux. I don't think is going to be her gallbladder or pancreas. I am starting her on omeprazole and I have urged her to take it every day for the next month if the problem disappears that will be a good trial of therapy.  We are continuing the trastuzumab through January of next year. I'm going to see her in about 6 weeks to make sure everything is working well for her. She knows to call for any other problems that may develop before her next visit.    Terri Cruel, MD   05/14/2016 10:25 AM

## 2016-05-14 NOTE — Addendum Note (Signed)
Addended by: Laureen Abrahams on: 05/14/2016 11:40 AM   Modules accepted: Orders, Medications

## 2016-05-14 NOTE — Patient Instructions (Signed)
[facility] Discharge Instructions for Patients Receiving Chemotherapy  Today you received the following chemotherapy agents: Herceptin   To help prevent nausea and vomiting after your treatment, we encourage you to take your nausea medication as directed.    If you develop nausea and vomiting that is not controlled by your nausea medication, call the clinic.   BELOW ARE SYMPTOMS THAT SHOULD BE REPORTED IMMEDIATELY:  *FEVER GREATER THAN 100.5 F  *CHILLS WITH OR WITHOUT FEVER  NAUSEA AND VOMITING THAT IS NOT CONTROLLED WITH YOUR NAUSEA MEDICATION  *UNUSUAL SHORTNESS OF BREATH  *UNUSUAL BRUISING OR BLEEDING  TENDERNESS IN MOUTH AND THROAT WITH OR WITHOUT PRESENCE OF ULCERS  *URINARY PROBLEMS  *BOWEL PROBLEMS  UNUSUAL RASH Items with * indicate a potential emergency and should be followed up as soon as possible.  Feel free to call the clinic you have any questions or concerns. The clinic phone number is [phone].  Please show the CHEMO ALERT CARD at check-in to the Emergency Department and triage nurse.   

## 2016-05-14 NOTE — Telephone Encounter (Signed)
appt made and avs printed °

## 2016-05-24 ENCOUNTER — Ambulatory Visit: Payer: PPO | Admitting: Oncology

## 2016-05-24 ENCOUNTER — Other Ambulatory Visit: Payer: PPO

## 2016-05-29 ENCOUNTER — Ambulatory Visit: Payer: PPO | Admitting: Radiation Oncology

## 2016-05-29 ENCOUNTER — Ambulatory Visit: Payer: PPO

## 2016-06-03 ENCOUNTER — Ambulatory Visit (HOSPITAL_BASED_OUTPATIENT_CLINIC_OR_DEPARTMENT_OTHER): Payer: PPO

## 2016-06-03 ENCOUNTER — Ambulatory Visit: Payer: PPO | Admitting: Radiation Oncology

## 2016-06-03 ENCOUNTER — Other Ambulatory Visit (HOSPITAL_BASED_OUTPATIENT_CLINIC_OR_DEPARTMENT_OTHER): Payer: PPO

## 2016-06-03 ENCOUNTER — Encounter: Payer: Self-pay | Admitting: *Deleted

## 2016-06-03 VITALS — BP 116/72 | HR 92 | Temp 99.1°F | Resp 17

## 2016-06-03 DIAGNOSIS — Z5112 Encounter for antineoplastic immunotherapy: Secondary | ICD-10-CM | POA: Diagnosis not present

## 2016-06-03 DIAGNOSIS — C50211 Malignant neoplasm of upper-inner quadrant of right female breast: Secondary | ICD-10-CM

## 2016-06-03 LAB — CBC WITH DIFFERENTIAL/PLATELET
BASO%: 0.4 % (ref 0.0–2.0)
Basophils Absolute: 0 10*3/uL (ref 0.0–0.1)
EOS ABS: 0.3 10*3/uL (ref 0.0–0.5)
EOS%: 6.2 % (ref 0.0–7.0)
HCT: 34.5 % — ABNORMAL LOW (ref 34.8–46.6)
HGB: 11.2 g/dL — ABNORMAL LOW (ref 11.6–15.9)
LYMPH%: 28.6 % (ref 14.0–49.7)
MCH: 28.2 pg (ref 25.1–34.0)
MCHC: 32.6 g/dL (ref 31.5–36.0)
MCV: 86.5 fL (ref 79.5–101.0)
MONO#: 0.5 10*3/uL (ref 0.1–0.9)
MONO%: 10.5 % (ref 0.0–14.0)
NEUT#: 2.8 10*3/uL (ref 1.5–6.5)
NEUT%: 54.3 % (ref 38.4–76.8)
PLATELETS: 244 10*3/uL (ref 145–400)
RBC: 3.98 10*6/uL (ref 3.70–5.45)
RDW: 16.3 % — ABNORMAL HIGH (ref 11.2–14.5)
WBC: 5.1 10*3/uL (ref 3.9–10.3)
lymph#: 1.5 10*3/uL (ref 0.9–3.3)

## 2016-06-03 LAB — COMPREHENSIVE METABOLIC PANEL
ALT: 23 U/L (ref 0–55)
ANION GAP: 10 meq/L (ref 3–11)
AST: 28 U/L (ref 5–34)
Albumin: 3.5 g/dL (ref 3.5–5.0)
Alkaline Phosphatase: 63 U/L (ref 40–150)
BUN: 13.5 mg/dL (ref 7.0–26.0)
CHLORIDE: 106 meq/L (ref 98–109)
CO2: 28 meq/L (ref 22–29)
Calcium: 9.6 mg/dL (ref 8.4–10.4)
Creatinine: 0.8 mg/dL (ref 0.6–1.1)
EGFR: 75 mL/min/{1.73_m2} — AB (ref >90)
Glucose: 93 mg/dl (ref 70–140)
Potassium: 4.2 mEq/L (ref 3.5–5.1)
Sodium: 144 mEq/L (ref 136–145)
Total Bilirubin: 1.02 mg/dL (ref 0.20–1.20)
Total Protein: 6.8 g/dL (ref 6.4–8.3)

## 2016-06-03 MED ORDER — SODIUM CHLORIDE 0.9% FLUSH
10.0000 mL | INTRAVENOUS | Status: DC | PRN
  Administered 2016-06-03: 10 mL
  Filled 2016-06-03: qty 10

## 2016-06-03 MED ORDER — HEPARIN SOD (PORK) LOCK FLUSH 100 UNIT/ML IV SOLN
500.0000 [IU] | Freq: Once | INTRAVENOUS | Status: AC | PRN
  Administered 2016-06-03: 500 [IU]
  Filled 2016-06-03: qty 5

## 2016-06-03 MED ORDER — DIPHENHYDRAMINE HCL 25 MG PO CAPS
25.0000 mg | ORAL_CAPSULE | Freq: Once | ORAL | Status: AC
  Administered 2016-06-03: 25 mg via ORAL

## 2016-06-03 MED ORDER — ACETAMINOPHEN 325 MG PO TABS
650.0000 mg | ORAL_TABLET | Freq: Once | ORAL | Status: AC
  Administered 2016-06-03: 650 mg via ORAL

## 2016-06-03 MED ORDER — TRASTUZUMAB CHEMO 150 MG IV SOLR
6.0000 mg/kg | Freq: Once | INTRAVENOUS | Status: AC
  Administered 2016-06-03: 567 mg via INTRAVENOUS
  Filled 2016-06-03: qty 27

## 2016-06-03 MED ORDER — SODIUM CHLORIDE 0.9 % IV SOLN
Freq: Once | INTRAVENOUS | Status: AC
  Administered 2016-06-03: 13:00:00 via INTRAVENOUS

## 2016-06-03 MED ORDER — DIPHENHYDRAMINE HCL 25 MG PO CAPS
ORAL_CAPSULE | ORAL | Status: AC
  Filled 2016-06-03: qty 1

## 2016-06-03 MED ORDER — ACETAMINOPHEN 325 MG PO TABS
ORAL_TABLET | ORAL | Status: AC
  Filled 2016-06-03: qty 2

## 2016-06-05 ENCOUNTER — Inpatient Hospital Stay: Admission: RE | Admit: 2016-06-05 | Payer: PPO | Source: Ambulatory Visit | Admitting: Radiation Oncology

## 2016-06-05 ENCOUNTER — Ambulatory Visit
Admission: RE | Admit: 2016-06-05 | Discharge: 2016-06-05 | Disposition: A | Discharge status: Home / Self Care | Payer: PPO | Source: Ambulatory Visit | Attending: Radiation Oncology | Admitting: Radiation Oncology

## 2016-06-05 ENCOUNTER — Encounter: Payer: Self-pay | Admitting: Radiation Oncology

## 2016-06-05 VITALS — BP 107/62 | HR 131 | Temp 97.7°F | Ht 66.0 in | Wt 202.3 lb

## 2016-06-05 DIAGNOSIS — C50211 Malignant neoplasm of upper-inner quadrant of right female breast: Secondary | ICD-10-CM

## 2016-06-05 DIAGNOSIS — Z170 Estrogen receptor positive status [ER+]: Secondary | ICD-10-CM | POA: Insufficient documentation

## 2016-06-05 DIAGNOSIS — Z510 Encounter for antineoplastic radiation therapy: Secondary | ICD-10-CM | POA: Insufficient documentation

## 2016-06-05 NOTE — Progress Notes (Addendum)
Terri Mayer completed Chemotherapy in May and is currently on Herceptin.  Denies any pain nor discomfort in surgical site, right breast and has full range of motion in the right arm.Reports decreasing swelling in her right axillary, and no evidence of lymphedema in her right arm.  She states that since eating better she is not as fatigued.  Denies any lightheadeness.  BP 91/74 mmHg  Pulse 110  Temp(Src) 97.7 F (36.5 C)  Ht 5\' 6"  (1.676 m)  Wt 202 lb 4.8 oz (91.763 kg)  BMI 32.67 kg/m2-sitting  BP 107/62 mmHg  Pulse 131  Temp(Src) 97.7 F (36.5 C)  Ht 5\' 6"  (1.676 m)  Wt 202 lb 4.8 oz (91.763 kg)  BMI 32.67 kg/m2 - standing

## 2016-06-05 NOTE — Progress Notes (Signed)
Radiation Oncology         [phone] ________________________________  Name: Terri Mayer MRN: [id number]  Date: 06/05/2016  DOB: 04-16-1951  YB:[id number] Terri Mayer Terri Mayer, Terri Mayer  Terri Mayer, Terri Mayer, Terri Mayer     REFERRING PHYSICIAN: Magrinat, Terri Mayer, Terri Mayer   DIAGNOSIS: Stage IIA, T1c, pN1, minimal ER positive, PR negative, HER-2 amplified  invasive ductal carcinoma of the right breast   HISTORY OF PRESENT ILLNESS: Terri Mayer is a 65 y.o. female seen at the request of Dr. Jana Mayer for consideration of radiotherapy to her right breast. Her history has been well outlined by her medical oncologist.  Essentially, the patient was diagnosed with node positive disease and was started on neoadjuvant chemotherapy on 12/18/15. She completed 6 cycles of chemotherapy, and has been on trastuzumab as well. She continues on this, and will complete this therapy in January 2018. She underwent right lumpectomy and sentinel lymph node sampling on 05/02/16 which revealed a complete response pathologically. She comes today since completing chemotherapy in anticipation of pursuing radiotherapy.    PREVIOUS RADIATION THERAPY: No   PAST MEDICAL HISTORY:  Past Medical History  Diagnosis Date  . History of chicken pox   . Allergy   . Breast cancer ([place])   . Osteoarthritis     Neck,Back,Knees,Hips,Ankles       PAST SURGICAL HISTORY: Past Surgical History  Procedure Laterality Date  . Eye surgery  at age 38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  . Wisdom tooth extraction    . Dilation and curettage of uterus    . Portacath placement Right 12/11/2015    Procedure: INSERTION PORT-A-CATH WITH ULTRASOUND;  Surgeon: Rolm Bookbinder, Terri Mayer;  Location: [facility];  Service: General;  Laterality: Right;  . Radioactive seed guided partial mastectomy/axillary sentinel node biopsy/axillary node dissection Right 05/02/2016    Procedure: RIGHT BREAST SEED GUIDED LUMPECTOMY AND RIGHT SEED TARGETED AXILLARY DISSECTION,AND  SENTINEL NODE BIOPSY;  Surgeon: Rolm Bookbinder, Terri Mayer;  Location: [facility];  Service: General;  Laterality: Right;  RIGHT BREAST SEED GUIDED LUMPECTOMY AND RIGHT SEED TARGETED AXILLARY DISSECTION,AND SENTINEL NODE BIOPSY     FAMILY HISTORY:  Family History  Problem Relation Age of Onset  . Heart disease Father   . Diabetes      GM  . Colon cancer Neg Hx   . Breast cancer Neg Hx   . Dementia      M  . Transient ischemic attack      F     SOCIAL HISTORY:  reports that she has never smoked. She has never used smokeless tobacco. She reports that she drinks alcohol. The patient is a Pharmacist, community. She is single and resides in [place].   ALLERGIES: Codeine   MEDICATIONS:  Current Outpatient Prescriptions  Medication Sig Dispense Refill  . carvedilol (COREG) 3.125 MG tablet Take 3.125 mg by mouth 2 (two) times daily with a meal.    . ibuprofen (ADVIL,MOTRIN) 200 MG tablet Take 200 mg by mouth every 6 (six) hours as needed for fever or mild pain. Reported on 03/22/2016    . lidocaine-prilocaine (EMLA) cream   3  . LORazepam (ATIVAN) 0.5 MG tablet Take 0.5 mg by mouth daily. Reported on 03/22/2016    . omeprazole (PRILOSEC) 40 MG capsule Take 1 capsule (40 mg total) by mouth daily. 90 capsule 4   No current facility-administered medications for this encounter.     REVIEW OF SYSTEMS: On review of systems, the patient reports that she  is doing well overall. She denies any chest pain, shortness of breath, cough, fevers, chills, night sweats, unintended weight changes. She denies any bowel or bladder disturbances, and denies abdominal pain, nausea or vomiting. She denies any new musculoskeletal or joint aches or pains. A complete review of systems is obtained and is otherwise negative. The patient denies any issues with healing at her incision site.       PHYSICAL EXAM:  height is _0  (1.676 m) and weight is 202 lb 4.8 oz (91.763 kg). Her temperature is 97.7 F (36.5 C). Her  blood pressure is 107/62 and her pulse is 131.   In general this is a well appearing caucasian female in no acute distress. She's alert and oriented x4 and appropriate throughout the examination. Cardiopulmonary assessment is negative for acute distress and she exhibits normal effort. Well healed inframammary incision and well healed mammary incision.  The patient is tachycardic, but is regular.     ECOG = 1  0 - Asymptomatic (Fully active, able to carry on all predisease activities without restriction)  1 - Symptomatic but completely ambulatory (Restricted in physically strenuous activity but ambulatory and able to carry out work of a light or sedentary nature. For example, light housework, office work)  2 - Symptomatic, <50% in bed during the day (Ambulatory and capable of all self care but unable to carry out any work activities. Up and about more than 50% of waking hours)  3 - Symptomatic, >50% in bed, but not bedbound (Capable of only limited self-care, confined to bed or chair 50% or more of waking hours)  4 - Bedbound (Completely disabled. Cannot carry on any self-care. Totally confined to bed or chair)  5 - Death   Eustace Pen MM, Creech RH, Tormey DC, et al. [phone]). "Toxicity and response criteria of the [facility]". [place] Oncol. 5 (6): 649-55    LABORATORY DATA:  Lab Results  Component Value Date   WBC 5.1 06/03/2016   HGB 11.2* 06/03/2016   HCT 34.5* 06/03/2016   MCV 86.5 06/03/2016   PLT 244 06/03/2016   Lab Results  Component Value Date   NA 144 06/03/2016   K 4.2 06/03/2016   CO2 28 06/03/2016   Lab Results  Component Value Date   ALT 23 06/03/2016   AST 28 06/03/2016   ALKPHOS 63 06/03/2016   BILITOT 1.02 06/03/2016      RADIOGRAPHY: No results found.     IMPRESSION:  Stage IIA, T1c, pN1, minimal ER positive, PR negative, HER-2 amplified  invasive ductal carcinoma of the right breast   PLAN: Dr. Lisbeth Renshaw discusses the pathology  findings, reviews her history, and and reviews the nature of invasive breast disease disease. He discusses the role of radiotherapy for local control and risk reduction of recurrent disease to the right breast. He discusses the risks, benefits, short and long term effects of radiotherapy, and reviews the logistics and delivery of treatment. Dr. Lisbeth Renshaw recommends a 4 week hypofractionated course of daily radiotherapy in 20 fractions.   The above documentation reflects my direct findings during this shared patient visit. Please see the separate note by Dr. Lisbeth Renshaw on this date for the remainder of the patient's plan of care.    Terri Mayer, Terri Mayer    Please see the note from Terri Simpson, Terri Mayer from today's visit for more details of today's encounter.  I have personally performed a face to face diagnostic evaluation on this patient and devised  the following assessment and plan.  The patient is a good candidate for adjuvant radiotherapy at this time. We will proceed with a hypo-fractionated course of radiation treatment to the right breast using high tangent fields given her history. She is very pleased with the surgical result involving a pathologic complete response.   Terri Rudd, Terri Mayer   This document serves as a record of services personally performed by Terri Simpson, Terri Mayer and  Terri Rudd, Terri Mayer. It was created on their behalf by Truddie Hidden, a trained medical scribe. The creation of this record is based on the scribe's personal observations and the providers' statements to them. This document has been checked and approved by the attending provider.

## 2016-06-07 ENCOUNTER — Other Ambulatory Visit: Payer: Self-pay | Admitting: *Deleted

## 2016-06-07 ENCOUNTER — Telehealth: Payer: Self-pay | Admitting: Oncology

## 2016-06-07 ENCOUNTER — Ambulatory Visit
Admission: RE | Admit: 2016-06-07 | Discharge: 2016-06-07 | Disposition: A | Discharge status: Home / Self Care | Payer: PPO | Source: Ambulatory Visit | Attending: Radiation Oncology | Admitting: Radiation Oncology

## 2016-06-07 DIAGNOSIS — C50211 Malignant neoplasm of upper-inner quadrant of right female breast: Secondary | ICD-10-CM

## 2016-06-07 DIAGNOSIS — Z510 Encounter for antineoplastic radiation therapy: Secondary | ICD-10-CM | POA: Diagnosis not present

## 2016-06-07 NOTE — Telephone Encounter (Signed)
per pof to  Cancel pt herceptin on 7/27 per Val-pt aware

## 2016-06-09 NOTE — Progress Notes (Signed)
  Radiation Oncology         [phone] ________________________________  Name: Terri Mayer MRN: [id number]  Date: 06/07/2016  DOB: 01-Apr-1951   DIAGNOSIS:     ICD-9-CM ICD-10-CM   1. Breast cancer of upper-inner quadrant of right female breast ([place]) 174.2 C50.211     SIMULATION AND TREATMENT PLANNING NOTE  The patient presented for simulation prior to beginning her course of radiation treatment for her diagnosis of Right-sided breast cancer. The patient was placed in a supine position on a breast board. A customized vac-lock bag was constructed and this complex treatment device will be used on a daily basis during her treatment. In this fashion, a CT scan was obtained through the chest area and an isocenter was placed near the chest wall within the breast.  The patient will be planned to receive a course of radiation initially to a dose of 42.5 Gy. This will consist of a whole breast radiotherapy technique. To accomplish this, 2 customized blocks have been designed which will correspond to medial and lateral whole breast tangent fields. This treatment will be accomplished at 2.5 Gy per fraction. A forward planning technique will also be evaluated to determine if this approach improves the plan. It is anticipated that the patient will then receive a 7.5 Gy boost to the seroma cavity which has been contoured. This will be accomplished at 2.5 Gy per fraction.   This initial treatment will consist of a 3-D conformal technique. The seroma has been contoured as the primary target structure. Additionally, dose volume histograms of both this target as well as the lungs and heart will also be evaluated. Such an approach is necessary to ensure that the target area is adequately covered while the nearby critical  normal structures are adequately spared.  Plan:  The final anticipated total dose therefore will correspond to 50 Gy.    _______________________________   Jodelle Gross, MD, PhD

## 2016-06-09 NOTE — Progress Notes (Signed)
  Radiation Oncology         (336) [phone] ________________________________  Name: Terri Mayer MRN: [id number]  Date: 06/07/2016  DOB: 12/03/1950  Optical Surface Tracking Plan:  Since intensity modulated radiotherapy (IMRT) and 3D conformal radiation treatment methods are predicated on accurate and precise positioning for treatment, intrafraction motion monitoring is medically necessary to ensure accurate and safe treatment delivery.  The ability to quantify intrafraction motion without excessive ionizing radiation dose can only be performed with optical surface tracking. Accordingly, surface imaging offers the opportunity to obtain 3D measurements of patient position throughout IMRT and 3D treatments without excessive radiation exposure.  I am ordering optical surface tracking for this patient's upcoming course of radiotherapy. ________________________________  Kyung Rudd, MD 06/09/2016 2:04 PM    Reference:   Particia Jasper, et al. Surface imaging-based analysis of intrafraction motion for breast radiotherapy patients.Journal of [place], n. 6, nov. 2014. ISSN [id number].   Available at: <http://www.jacmp.org/index.php/jacmp/article/view/4957>.

## 2016-06-10 ENCOUNTER — Ambulatory Visit: Payer: PPO | Admitting: Radiation Oncology

## 2016-06-11 DIAGNOSIS — Z510 Encounter for antineoplastic radiation therapy: Secondary | ICD-10-CM | POA: Diagnosis not present

## 2016-06-11 DIAGNOSIS — C50211 Malignant neoplasm of upper-inner quadrant of right female breast: Secondary | ICD-10-CM | POA: Diagnosis not present

## 2016-06-12 ENCOUNTER — Ambulatory Visit: Admission: RE | Admit: 2016-06-12 | Payer: PPO | Source: Ambulatory Visit | Admitting: Radiation Oncology

## 2016-06-13 ENCOUNTER — Ambulatory Visit: Payer: PPO

## 2016-06-14 ENCOUNTER — Ambulatory Visit
Admission: RE | Admit: 2016-06-14 | Discharge: 2016-06-14 | Disposition: A | Discharge status: Home / Self Care | Payer: PPO | Source: Ambulatory Visit | Attending: Radiation Oncology | Admitting: Radiation Oncology

## 2016-06-14 ENCOUNTER — Ambulatory Visit: Payer: PPO | Admitting: Radiation Oncology

## 2016-06-14 ENCOUNTER — Encounter: Payer: Self-pay | Admitting: Radiation Oncology

## 2016-06-14 VITALS — BP 135/70 | HR 120 | Ht 66.0 in

## 2016-06-14 DIAGNOSIS — C50211 Malignant neoplasm of upper-inner quadrant of right female breast: Secondary | ICD-10-CM | POA: Diagnosis not present

## 2016-06-14 DIAGNOSIS — Z510 Encounter for antineoplastic radiation therapy: Secondary | ICD-10-CM | POA: Diagnosis not present

## 2016-06-14 MED ORDER — RADIAPLEXRX EX GEL
Freq: Once | CUTANEOUS | Status: AC
  Administered 2016-06-14: 14:00:00 via TOPICAL

## 2016-06-14 MED ORDER — ALRA NON-METALLIC DEODORANT (RAD-ONC)
1.0000 "application " | Freq: Once | TOPICAL | Status: AC
  Administered 2016-06-14: 1 via TOPICAL

## 2016-06-14 NOTE — Progress Notes (Addendum)
Ms. Sopp in today for her port check today.  Concerned that she has a color change in her areola region since tattoo placed near this area, yellow appearance.  Note some edema in this region.  Seen by Dr. Tammi Klippel who states that he feels this may be an reaction to the dye for her tattoo and she stated  satisfion with this explanation.  Education today.  Pt here for patient teaching.  Pt given Radiation and You booklet, skin care instructions, Alra deodorant and Radiaplex gel. Pt reports they have watched the Radiation Therapy Education video on June 07, 2016.  Reviewed areas of pertinence such as  . Pt able to give teach back of to pat skin and use unscented/gentle soap,apply Radiaplex bid, avoid applying anything to skin within 4 hours of treatment, avoid wearing an under wire bra and to use an electric razor if they must shave. Pt demonstrated understanding of information given and will contact nursing with any questions or concerns.     Http://rtanswers.org/treatmentinformation/whattoexpect/index

## 2016-06-17 ENCOUNTER — Ambulatory Visit
Admission: RE | Admit: 2016-06-17 | Discharge: 2016-06-17 | Disposition: A | Discharge status: Home / Self Care | Payer: PPO | Source: Ambulatory Visit | Attending: Radiation Oncology | Admitting: Radiation Oncology

## 2016-06-17 DIAGNOSIS — C50211 Malignant neoplasm of upper-inner quadrant of right female breast: Secondary | ICD-10-CM | POA: Diagnosis not present

## 2016-06-17 DIAGNOSIS — Z510 Encounter for antineoplastic radiation therapy: Secondary | ICD-10-CM | POA: Diagnosis not present

## 2016-06-18 ENCOUNTER — Ambulatory Visit
Admission: RE | Admit: 2016-06-18 | Discharge: 2016-06-18 | Disposition: A | Discharge status: Home / Self Care | Payer: PPO | Source: Ambulatory Visit

## 2016-06-18 DIAGNOSIS — Z510 Encounter for antineoplastic radiation therapy: Secondary | ICD-10-CM | POA: Diagnosis not present

## 2016-06-18 DIAGNOSIS — C50211 Malignant neoplasm of upper-inner quadrant of right female breast: Secondary | ICD-10-CM | POA: Diagnosis not present

## 2016-06-19 ENCOUNTER — Ambulatory Visit
Admission: RE | Admit: 2016-06-19 | Discharge: 2016-06-19 | Disposition: A | Discharge status: Home / Self Care | Payer: PPO | Source: Ambulatory Visit

## 2016-06-19 DIAGNOSIS — C50211 Malignant neoplasm of upper-inner quadrant of right female breast: Secondary | ICD-10-CM | POA: Diagnosis not present

## 2016-06-19 DIAGNOSIS — Z510 Encounter for antineoplastic radiation therapy: Secondary | ICD-10-CM | POA: Diagnosis not present

## 2016-06-20 ENCOUNTER — Ambulatory Visit
Admission: RE | Admit: 2016-06-20 | Discharge: 2016-06-20 | Disposition: A | Discharge status: Home / Self Care | Payer: PPO | Source: Ambulatory Visit

## 2016-06-20 DIAGNOSIS — C50211 Malignant neoplasm of upper-inner quadrant of right female breast: Secondary | ICD-10-CM | POA: Diagnosis not present

## 2016-06-20 DIAGNOSIS — Z510 Encounter for antineoplastic radiation therapy: Secondary | ICD-10-CM | POA: Diagnosis not present

## 2016-06-21 ENCOUNTER — Other Ambulatory Visit (HOSPITAL_COMMUNITY): Payer: PPO

## 2016-06-21 ENCOUNTER — Encounter: Payer: Self-pay | Admitting: Radiation Oncology

## 2016-06-21 ENCOUNTER — Ambulatory Visit
Admission: RE | Admit: 2016-06-21 | Discharge: 2016-06-21 | Disposition: A | Discharge status: Home / Self Care | Payer: PPO | Source: Ambulatory Visit | Attending: Radiation Oncology | Admitting: Radiation Oncology

## 2016-06-21 ENCOUNTER — Encounter (HOSPITAL_COMMUNITY): Payer: Self-pay | Admitting: Internal Medicine

## 2016-06-21 ENCOUNTER — Ambulatory Visit
Admission: RE | Admit: 2016-06-21 | Discharge: 2016-06-21 | Disposition: A | Discharge status: Home / Self Care | Payer: PPO | Source: Ambulatory Visit

## 2016-06-21 ENCOUNTER — Ambulatory Visit (HOSPITAL_COMMUNITY)
Admission: RE | Admit: 2016-06-21 | Discharge: 2016-06-21 | Disposition: A | Discharge status: Home / Self Care | Payer: PPO | Source: Ambulatory Visit | Attending: Internal Medicine | Admitting: Internal Medicine

## 2016-06-21 ENCOUNTER — Ambulatory Visit (HOSPITAL_BASED_OUTPATIENT_CLINIC_OR_DEPARTMENT_OTHER)
Admission: RE | Admit: 2016-06-21 | Discharge: 2016-06-21 | Disposition: A | Discharge status: Home / Self Care | Payer: PPO | Source: Ambulatory Visit | Attending: Internal Medicine | Admitting: Internal Medicine

## 2016-06-21 ENCOUNTER — Ambulatory Visit: Admission: RE | Admit: 2016-06-21 | Payer: PPO | Source: Ambulatory Visit | Admitting: Radiation Oncology

## 2016-06-21 VITALS — BP 112/55 | HR 113 | Temp 98.8°F | Resp 18 | Wt 202.7 lb

## 2016-06-21 VITALS — BP 124/86 | HR 77 | Wt 200.8 lb

## 2016-06-21 DIAGNOSIS — M1990 Unspecified osteoarthritis, unspecified site: Secondary | ICD-10-CM | POA: Insufficient documentation

## 2016-06-21 DIAGNOSIS — Z9221 Personal history of antineoplastic chemotherapy: Secondary | ICD-10-CM | POA: Insufficient documentation

## 2016-06-21 DIAGNOSIS — R600 Localized edema: Secondary | ICD-10-CM | POA: Insufficient documentation

## 2016-06-21 DIAGNOSIS — Z170 Estrogen receptor positive status [ER+]: Secondary | ICD-10-CM | POA: Insufficient documentation

## 2016-06-21 DIAGNOSIS — Z510 Encounter for antineoplastic radiation therapy: Secondary | ICD-10-CM | POA: Diagnosis not present

## 2016-06-21 DIAGNOSIS — C50211 Malignant neoplasm of upper-inner quadrant of right female breast: Secondary | ICD-10-CM

## 2016-06-21 DIAGNOSIS — R002 Palpitations: Secondary | ICD-10-CM | POA: Diagnosis not present

## 2016-06-21 DIAGNOSIS — R000 Tachycardia, unspecified: Secondary | ICD-10-CM | POA: Insufficient documentation

## 2016-06-21 NOTE — Addendum Note (Signed)
Encounter addended by: Scarlette Calico, RN on: 06/21/2016 10:54 AM<BR>    Actions taken: Order Entry activity accessed, Diagnosis association updated

## 2016-06-21 NOTE — Progress Notes (Signed)
  Echocardiogram 2D Echocardiogram has been performed.  Terri Mayer 06/21/2016, 9:58 AM

## 2016-06-21 NOTE — Progress Notes (Signed)
Weekly rad txs right breast 5/20  Completed, mild pink  skin changes,skin intact, using radiaplex bid, next Herceptin  Due this  Next Monday  1:41 PM BP (!) 112/55 (BP Location: Right Arm, Patient Position: Sitting, Cuff Size: Normal)   Pulse (!) 113   Temp 98.8 F (37.1 C) (Oral)   Resp 18   Wt 202 lb 11.2 oz (91.9 kg)   BMI 32.72 kg/m   Wt Readings from Last 3 Encounters:  06/21/16 202 lb 11.2 oz (91.9 kg)  06/21/16 200 lb 12.8 oz (91.1 kg)  06/05/16 202 lb 4.8 oz (91.8 kg)

## 2016-06-21 NOTE — Progress Notes (Signed)
   Department of Radiation Oncology  Phone:  [phone] Fax:        [phone]  Weekly Treatment Note    Name: Terri Mayer Date: 06/21/2016 MRN: [id number] DOB: [birth year]   Diagnosis:     ICD-9-CM ICD-10-CM   1. Breast cancer of upper-inner quadrant of right female breast ([place]) 174.2 C50.211      Current dose: 12.5 Gy  Current fraction:5   MEDICATIONS: Current Outpatient Prescriptions  Medication Sig Dispense Refill  . ibuprofen (ADVIL,MOTRIN) 200 MG tablet Take 200 mg by mouth every 6 (six) hours as needed for fever or mild pain. Reported on 03/22/2016    . lidocaine-prilocaine (EMLA) cream   3  . LORazepam (ATIVAN) 0.5 MG tablet Take 0.5 mg by mouth daily as needed. Reported on 03/22/2016    . omeprazole (PRILOSEC) 40 MG capsule Take 1 capsule (40 mg total) by mouth daily. 90 capsule 4   No current facility-administered medications for this encounter.      ALLERGIES: Codeine   LABORATORY DATA:  Lab Results  Component Value Date   WBC 5.1 06/03/2016   HGB 11.2 (L) 06/03/2016   HCT 34.5 (L) 06/03/2016   MCV 86.5 06/03/2016   PLT 244 06/03/2016   Lab Results  Component Value Date   NA 144 06/03/2016   K 4.2 06/03/2016   CO2 28 06/03/2016   Lab Results  Component Value Date   ALT 23 06/03/2016   AST 28 06/03/2016   ALKPHOS 63 06/03/2016   BILITOT 1.02 06/03/2016     NARRATIVE: Terri Mayer was seen today for weekly treatment management. The chart was checked and the patient's films were reviewed.  Weekly rad txs right breast 5/20  Completed, mild pink  skin changes,skin intact, using radiaplex bid, next Herceptin  Due this  Next Monday  2:13 PM BP (!) 112/55 (BP Location: Right Arm, Patient Position: Sitting, Cuff Size: Normal)   Pulse (!) 113   Temp 98.8 F (37.1 C) (Oral)   Resp 18   Wt 202 lb 11.2 oz (91.9 kg)   BMI 32.72 kg/m   Wt Readings from Last 3 Encounters:  06/21/16 202 lb 11.2 oz (91.9 kg)  06/21/16 200 lb 12.8 oz (91.1  kg)  06/05/16 202 lb 4.8 oz (91.8 kg)    PHYSICAL EXAMINATION: weight is 202 lb 11.2 oz (91.9 kg). Her oral temperature is 98.8 F (37.1 C). Her blood pressure is 112/55 (abnormal) and her pulse is 113 (abnormal). Her respiration is 18.        ASSESSMENT: The patient is doing satisfactorily with treatment.  PLAN: We will continue with the patient's radiation treatment as planned.

## 2016-06-21 NOTE — Addendum Note (Signed)
Encounter addended by: Scarlette Calico, RN on: 06/21/2016 10:45 AM<BR>    Actions taken: Sign clinical note

## 2016-06-21 NOTE — Patient Instructions (Signed)
We will contact you in 3 months to schedule your next appointment and echocardiogram  

## 2016-06-21 NOTE — Progress Notes (Signed)
Patient ID: Terri Mayer, female   DOB: January 14, 1951, 65 y.o.   MRN: [id number]    Advanced Heart Failure/Cardio0oncology Note   Referring Physician: Dr Jana Hakim Primary Care: Dr. Bernerd Limbo Primary Cardiologist: New (Dr. 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 ER/PR- and HER-2-neu positive. Referred by Dr. Jana Hakim for enrollment into cardio-oncology clinic.   Breast CA profile: Biopsy of the right breast 8:30 o'clock lesion and the right axillary lymph node on 11/16/2015 showed ([id number] [phone]) both to be positive for invasive ductal carcinoma, grade 3, the breast mass being estrogen receptor 10% positive with moderate staining intensity, but progesterone receptor negative, but the lymph node being estrogen and progesterone both negative. Both the breast and lymph nodes had MIB-1 of 30%. Both were positive for HER-2 amplification, the signals ratio is being 6.06 and 7.61, and the number per cell 10.00 and 11.80.   She presents today for cardio-oncology follow up.  Had lumpectomy in June 2017. All negative for CA. Remains on Herceptin. Tolerating well. Stopped carvedilol due to fatigue and low BP (90s) with presyncope. HR has been high at times (at XRT there are several recorded at 120-130). Mostly 70-90. Occasional palpitations at night.    Echo 12/12/15 EF 65%, LS' 10.1   GLS -21.9%, Grade 2 DD Echo 02/28/16 EF 55%, LS' 9.2     GLS -15.9%, Grade 1 DD, Mildly calcified AV Echo 04/19/16 EF 50-55%, LS' 9.1  GLS - 16.9%,  Echo 06/21/16 EF ~55% LS' 10.0   GLS -16.4%   Past Medical History:  Diagnosis Date  . Allergy   . Breast cancer ([place])   . History of chicken pox   . Osteoarthritis    Neck,Back,Knees,Hips,Ankles    Current Outpatient Prescriptions  Medication Sig Dispense Refill  . ibuprofen (ADVIL,MOTRIN) 200 MG tablet Take 200 mg by mouth every  6 (six) hours as needed for fever or mild pain. Reported on 03/22/2016    . lidocaine-prilocaine (EMLA) cream   3  . LORazepam (ATIVAN) 0.5 MG tablet Take 0.5 mg by mouth daily. Reported on 03/22/2016    . omeprazole (PRILOSEC) 40 MG capsule Take 1 capsule (40 mg total) by mouth daily. 90 capsule 4   No current facility-administered medications for this encounter.     Allergies  Allergen Reactions  . Codeine Nausea And Vomiting      Social History   Social History  . Marital status: Single    Spouse name: N/A  . Number of children: 1  . Years of education: N/A   Occupational History  . dentist    Social History Main Topics  . Smoking status: Never Smoker  . Smokeless tobacco: Never Used  . Alcohol use Yes     Comment: rarely   . Drug use: Unknown  . Sexual activity: Not on file   Other Topics Concern  . Not on file   Social History Narrative   Single, adopted 1 chil      Family History  Problem Relation Age of Onset  . Heart disease Father   . Diabetes      GM  . Colon cancer Neg Hx   . Breast cancer Neg Hx   . Dementia      M  . Transient ischemic attack      F    Vitals:   06/21/16 1024  BP: 124/86  Pulse: 77  SpO2: 98%  Weight: 200 lb 12.8 oz (91.1 kg)    PHYSICAL EXAM: General:  WDWN. NAD.  HEENT: normal Neck: supple. JVD not elevated. Carotids 2+ bilat; no bruits. No thyromegaly or nodule noted appreciated. Cor: PMI nondisplaced. RRR, No M/G/R appreciated. Port R chest.  Lungs: CTAB, normal effort Abdomen: Obese, soft, NT, ND, no HSM. No bruits or masses. +BS  Extremities: no cyanosis, clubbing, rash. Trace ankle edema.  Neuro: alert & oriented x 3, cranial nerves grossly intact. moves all 4 extremities w/o difficulty. Affect pleasant.   ASSESSMENT & PLAN:   1. Female breast cancer of the right upper-inner quadrant - Has completed 6 of 6 cycles of Carboplatin + Docetaxel + trastuzumab and pertuzamab over same time period. Finishes Herceptin  in January 2018  --I reviewed echos personally. EF on the low end of normal range but EF and Doppler parameters stable. No HF on exam. Continue Herceptin.  2. Peripheral edema - Improved - Continue 20 mg lasix as needed for worsening edema.  3. Tachycardia/palpitations - In NSR today. We discussed potential 30-day monitor or [employer]ive[employer] system for her phone. If persists we will pursue [employer]ive[employer]  Caelan Branden,MD 10:33 AM

## 2016-06-23 NOTE — Progress Notes (Signed)
[facility]  [phone] [phone]   ID: ALVENA KIERNAN DOB: 09/15/1951  MR#: [id number]  TGP#:[id number]  Patient Care Team: [name], MD as PCP - General (Internal Medicine) Chauncey Cruel, MD as Consulting Physician (Oncology) Rolm Bookbinder, MD as Consulting Physician (General Surgery) Princess Bruins, MD as Consulting Physician (Obstetrics and Gynecology) Jolaine Artist, MD as Consulting Physician (Cardiology) PCP: [name], MD GYN: SU:  OTHER MD:  CHIEF COMPLAINT: right breast cancer  CURRENT TREATMENT: Trastuzumab; adjuvant radiation  BREAST CANCER HISTORY: From the original intake note:  "[name]" underwent bilateral screening mammography with tomosynthesis at the breast center 11/02/2015 showing a possible mass in the right breast and low right axillary area. She was called back for right diagnostic mammography with tomosynthesis and right ultrasonography 11/14/2015. The breast density was category B. There was a lobulated mass in the lateral right breast, with a smaller oval mass immediately adjacent to it. Also in the upper outer quadrant there was a third mass. This has been stable back to 2014 and is felt to be a fibroadenoma.  Ultrasound of the right breast confirmed a hypoechoic mass at the 3:00 position 6 cm from the nipple, measuring 1.4 cm. Adjacent to this there was a 0.5 cm internal mammary lymph node, with normal morphology and normal hilar fat. The mass at the 10:00 position measured 0.8 cm and again was consistent with a fibroadenoma. In the right axilla there was an abnormal lymph node with no internal fat measuring 1.8 cm. The rest of the exam was unremarkable.  Biopsy of the right breast 8:30 o'clock lesion and the right axillary lymph node on 11/16/2015 showed ([id number]) both to be positive for invasive ductal carcinoma, grade 3, the breast mass being estrogen receptor 10% positive with  moderate staining intensity, but progesterone receptor negative, but the lymph node being estrogen and progesterone both negative. Both the breast and lymph nodes had MIB-1 of 30%. Both were positive for HER-2 amplification, the signals ratio is being 6.06 and 7.61, and the number per cell 10.00 and 11.80.  The patient's subsequent history is as detailed below   INTERVAL HISTORY: [name] returns for follow up of her HER-2 positive breast cancer. She is currently undergoing adjuvant radiation and also continuing her every 3 week trastuzumab, with dose due today.  Her most recent echocardiogram was 06/21/2016, with an ejection fraction in the 50-55% range. This was unchanged from her prior 2 echocardiograms.  REVIEW OF SYSTEMS: [name] is tolerating the radiation well so far, but of course she has only had 6 treatments. She now has a normal appetite and the Prilosec really helped her get rid of her reflux problem. She has been off that medication now for a couple of weeks without a significant recurrence. She tolerates the Herceptin with no side effects that she is aware except for minimal fatigue and minimal nausea. She is a bit anxious about the radiation, and she had a reaction to the tattoo dye used there. Otherwise a detailed review of systems today was noncontributory  PAST MEDICAL HISTORY: Past Medical History:  Diagnosis Date  . Allergy   . Breast cancer ([place])   . History of chicken pox   . Osteoarthritis    Neck,Back,Knees,Hips,Ankles    PAST SURGICAL HISTORY: Past Surgical History:  Procedure Laterality Date  . DILATION AND CURETTAGE OF UTERUS    . EYE SURGERY  at age 4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  . PORTACATH PLACEMENT  Right 12/11/2015   Procedure: INSERTION PORT-A-CATH WITH ULTRASOUND;  Surgeon: Rolm Bookbinder, MD;  Location: [facility];  Service: General;  Laterality: Right;  . RADIOACTIVE SEED GUIDED PARTIAL MASTECTOMY/AXILLARY SENTINEL NODE BIOPSY/AXILLARY NODE  DISSECTION Right 05/02/2016   Procedure: RIGHT BREAST SEED GUIDED LUMPECTOMY AND RIGHT SEED TARGETED AXILLARY DISSECTION,AND SENTINEL NODE BIOPSY;  Surgeon: Rolm Bookbinder, MD;  Location: [facility];  Service: General;  Laterality: Right;  RIGHT BREAST SEED GUIDED LUMPECTOMY AND RIGHT SEED TARGETED AXILLARY DISSECTION,AND SENTINEL NODE BIOPSY  . WISDOM TOOTH EXTRACTION      FAMILY HISTORY Family History  Problem Relation Age of Onset  . Heart disease Father   . Diabetes      GM  . Colon cancer Neg Hx   . Breast cancer Neg Hx   . Dementia      M  . Transient ischemic attack      F   the patient's father died after multiple strokes at the age of 84. The patient's mother died at the age of 62 with Alzheimer's disease. The patient had one brother, one sister. The patient's paternal grandfather died from lung cancer in his 17s. On the mother's side there are 2 (second) cousins with breast cancer diagnosed in their early 48s, and a more remote cousin diagnosed with breast cancer at age 70. There is no history of ovarian cancer.   GYNECOLOGIC HISTORY:  No LMP recorded. Patient is postmenopausal.  menarche age 86, the patient is GX P0 and she understands that not carrying a child to term before the age of 24 essentially doubled the risk of breast cancer. She stopped having periods between age 43 and 24 and did not take hormone replacement. 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 She lives by herself, with her pet dog. Her son, adopted at birth, Kadeisha Betsch, currently 20, attends [employer] in the psychology Department.     ADVANCED DIRECTIVES: Not in place. At the initial clinic visit the patient was given the appropriate forms to complete and notarize at her discretion.    HEALTH MAINTENANCE: Social History  Substance Use Topics  . Smoking  status: Never Smoker  . Smokeless tobacco: Never Used  . Alcohol use Yes     Comment: rarely      Colonoscopy: 12/28/2004  PAP:  Bone density:  Lipid panel:  Allergies  Allergen Reactions  . Codeine Nausea And Vomiting    Current Outpatient Prescriptions  Medication Sig Dispense Refill  . ibuprofen (ADVIL,MOTRIN) 200 MG tablet Take 200 mg by mouth every 6 (six) hours as needed for fever or mild pain. Reported on 03/22/2016    . lidocaine-prilocaine (EMLA) cream   3  . LORazepam (ATIVAN) 0.5 MG tablet Take 0.5 mg by mouth daily as needed. Reported on 03/22/2016    . omeprazole (PRILOSEC) 40 MG capsule Take 1 capsule (40 mg total) by mouth daily. 90 capsule 4   No current facility-administered medications for this visit.     OBJECTIVE: Middle-aged white womanWho appears stated age Wall  Vitals:   06/24/16 1109  BP: 135/65  Pulse: 85  Resp: 18  Temp: 99.2 F (37.3 C)     Body mass index is 32.31 kg/m.    ECOG FS:1 - Symptomatic but completely ambulatory  Sclerae unicteric, EOMs intact Oropharynx clear and moist  No cervical or supraclavicular adenopathy Lungs no rales or rhonchi Heart regular rate and rhythm Abd soft, nontender, positive bowel sounds MSK no focal spinal tenderness, no upper extremity lymphedema Neuro: nonfocal, well oriented, appropriate affect Breasts: Deferred   LAB RESULTS:  CMP     Component Value Date/Time   NA 143 06/24/2016 1022   K 5.2 (H) 06/24/2016 1022   CO2 29 06/24/2016 1022   GLUCOSE 97 06/24/2016 1022   BUN 18.5 06/24/2016 1022   CREATININE 0.8 06/24/2016 1022   CALCIUM 10.1 06/24/2016 1022   PROT 7.0 06/24/2016 1022   ALBUMIN 3.8 06/24/2016 1022   AST 17 06/24/2016 1022   ALT 13 06/24/2016 1022   ALKPHOS 63 06/24/2016 1022   BILITOT 0.96 06/24/2016 1022    INo results found for: SPEP, UPEP  Lab Results  Component Value Date   WBC 6.1 06/24/2016   NEUTROABS 3.9 06/24/2016   HGB 12.3 06/24/2016   HCT 37.8 06/24/2016    MCV 85.2 06/24/2016   PLT 298 06/24/2016      Chemistry      Component Value Date/Time   NA 143 06/24/2016 1022   K 5.2 (H) 06/24/2016 1022   CO2 29 06/24/2016 1022   BUN 18.5 06/24/2016 1022   CREATININE 0.8 06/24/2016 1022      Component Value Date/Time   CALCIUM 10.1 06/24/2016 1022   ALKPHOS 63 06/24/2016 1022   AST 17 06/24/2016 1022   ALT 13 06/24/2016 1022   BILITOT 0.96 06/24/2016 1022       No results found for: LABCA2  No components found for: LABCA125  No results for input(s): INR in the last 168 hours.  Urinalysis    Component Value Date/Time   BILIRUBINUR neg 12/29/2015 1014   PROTEINUR pos 12/29/2015 1014   UROBILINOGEN negative 12/29/2015 1014   NITRITE neg 12/29/2015 1014   LEUKOCYTESUR Negative 12/29/2015 1014   STUDIES: No results found.   ASSESSMENT: 65 y.o. [place] woman status post right breast lower outer quadrant and right axillary lymph node biopsy 11/16/2015, also positive for a clinical  T1c pN1, stage IIA invasive ductal carcinoma grade 3, essentially estrogen and progesterone receptor negative but HER-2 amplified, with an MIB-1 of 30%  (a) the breast lesion had moderate positivity for the estrogen receptor at 10%; the axillary lymph node lesion was estrogen receptor negative. Both were progesterone receptor negative  (1) neoadjuvant chemotherapy with carboplatin, docetaxel, trastuzumab and pertuzumab for 6 cycles, started 12/18/2015  (a) docetaxel switched to gemcitabine for cycles 4-6 because of progressive neuropathy  (b) pertuzumab dose cut in half with cycle 4 due to excessive diarrhea, resumed full dose cycle 5  (2) trastuzumab continued to total one year, through January 2018  (a) baseline echocardiogram on 12/12/15 showed an EF of 60-65%  (b) echocardiogram 04/19/2016 showed an ejection fraction in the 50-55% range  (3) right lumpectomy and sentinel lymph node sampling 05/02/2016 showed a complete pathologic response (ypT0,  ypN0)  (4) adjuvant radiation to be completed 07/12/2016  PLAN: [name] just had a repeat echocardiogram which remains stable and the plan is to continue the Herceptin to complete one year.  She gets a little bit of nausea with the treatment but not enough that she needs nausea medicine. She does not like taking the cardiology medications and so we're continuing to follow of those pills.  She does have a regular exercise program, although it could be a little bit more intense. We discussed the fact that she is  going to get more tired from the radiation and I think that we'll pull her out of that quicker is the exercise that she is doing and whatever else she can.  Overall however I am delighted at how well she is doing. She knows to call for any problems that may develop before her next visit here which will be in 3 months.    Chauncey Cruel, MD   06/24/2016 7:13 PM

## 2016-06-24 ENCOUNTER — Ambulatory Visit
Admission: RE | Admit: 2016-06-24 | Discharge: 2016-06-24 | Disposition: A | Discharge status: Home / Self Care | Payer: PPO | Source: Ambulatory Visit

## 2016-06-24 ENCOUNTER — Encounter: Payer: Self-pay | Admitting: *Deleted

## 2016-06-24 ENCOUNTER — Telehealth: Payer: Self-pay | Admitting: Oncology

## 2016-06-24 ENCOUNTER — Ambulatory Visit (HOSPITAL_BASED_OUTPATIENT_CLINIC_OR_DEPARTMENT_OTHER): Payer: PPO

## 2016-06-24 ENCOUNTER — Ambulatory Visit (HOSPITAL_BASED_OUTPATIENT_CLINIC_OR_DEPARTMENT_OTHER): Payer: PPO | Admitting: Oncology

## 2016-06-24 ENCOUNTER — Other Ambulatory Visit (HOSPITAL_BASED_OUTPATIENT_CLINIC_OR_DEPARTMENT_OTHER): Payer: PPO

## 2016-06-24 VITALS — BP 135/65 | HR 85 | Temp 99.2°F | Resp 18 | Ht 66.0 in | Wt 200.2 lb

## 2016-06-24 DIAGNOSIS — Z5112 Encounter for antineoplastic immunotherapy: Secondary | ICD-10-CM | POA: Diagnosis not present

## 2016-06-24 DIAGNOSIS — C50211 Malignant neoplasm of upper-inner quadrant of right female breast: Secondary | ICD-10-CM

## 2016-06-24 DIAGNOSIS — Z510 Encounter for antineoplastic radiation therapy: Secondary | ICD-10-CM | POA: Diagnosis not present

## 2016-06-24 LAB — COMPREHENSIVE METABOLIC PANEL
ALT: 13 U/L (ref 0–55)
AST: 17 U/L (ref 5–34)
Albumin: 3.8 g/dL (ref 3.5–5.0)
Alkaline Phosphatase: 63 U/L (ref 40–150)
Anion Gap: 9 mEq/L (ref 3–11)
BUN: 18.5 mg/dL (ref 7.0–26.0)
CHLORIDE: 106 meq/L (ref 98–109)
CO2: 29 meq/L (ref 22–29)
CREATININE: 0.8 mg/dL (ref 0.6–1.1)
Calcium: 10.1 mg/dL (ref 8.4–10.4)
EGFR: 77 mL/min/{1.73_m2} — ABNORMAL LOW (ref >90)
Glucose: 97 mg/dl (ref 70–140)
Potassium: 5.2 mEq/L — ABNORMAL HIGH (ref 3.5–5.1)
SODIUM: 143 meq/L (ref 136–145)
Total Bilirubin: 0.96 mg/dL (ref 0.20–1.20)
Total Protein: 7 g/dL (ref 6.4–8.3)

## 2016-06-24 LAB — CBC WITH DIFFERENTIAL/PLATELET
BASO%: 0.4 % (ref 0.0–2.0)
Basophils Absolute: 0 10*3/uL (ref 0.0–0.1)
EOS%: 2.7 % (ref 0.0–7.0)
Eosinophils Absolute: 0.2 10*3/uL (ref 0.0–0.5)
HCT: 37.8 % (ref 34.8–46.6)
HGB: 12.3 g/dL (ref 11.6–15.9)
LYMPH%: 24.8 % (ref 14.0–49.7)
MCH: 27.6 pg (ref 25.1–34.0)
MCHC: 32.4 g/dL (ref 31.5–36.0)
MCV: 85.2 fL (ref 79.5–101.0)
MONO#: 0.5 10*3/uL (ref 0.1–0.9)
MONO%: 8.4 % (ref 0.0–14.0)
NEUT#: 3.9 10*3/uL (ref 1.5–6.5)
NEUT%: 63.7 % (ref 38.4–76.8)
Platelets: 298 10*3/uL (ref 145–400)
RBC: 4.44 10*6/uL (ref 3.70–5.45)
RDW: 15.5 % — ABNORMAL HIGH (ref 11.2–14.5)
WBC: 6.1 10*3/uL (ref 3.9–10.3)
lymph#: 1.5 10*3/uL (ref 0.9–3.3)

## 2016-06-24 MED ORDER — ACETAMINOPHEN 325 MG PO TABS
ORAL_TABLET | ORAL | Status: AC
  Filled 2016-06-24: qty 2

## 2016-06-24 MED ORDER — DIPHENHYDRAMINE HCL 25 MG PO CAPS
25.0000 mg | ORAL_CAPSULE | Freq: Once | ORAL | Status: AC
  Administered 2016-06-24: 25 mg via ORAL

## 2016-06-24 MED ORDER — SODIUM CHLORIDE 0.9% FLUSH
10.0000 mL | INTRAVENOUS | Status: DC | PRN
  Administered 2016-06-24: 10 mL
  Filled 2016-06-24: qty 10

## 2016-06-24 MED ORDER — HEPARIN SOD (PORK) LOCK FLUSH 100 UNIT/ML IV SOLN
500.0000 [IU] | Freq: Once | INTRAVENOUS | Status: AC | PRN
  Administered 2016-06-24: 500 [IU]
  Filled 2016-06-24: qty 5

## 2016-06-24 MED ORDER — TRASTUZUMAB CHEMO 150 MG IV SOLR
6.0000 mg/kg | Freq: Once | INTRAVENOUS | Status: AC
  Administered 2016-06-24: 567 mg via INTRAVENOUS
  Filled 2016-06-24: qty 27

## 2016-06-24 MED ORDER — SODIUM CHLORIDE 0.9 % IV SOLN
Freq: Once | INTRAVENOUS | Status: AC
  Administered 2016-06-24: 13:00:00 via INTRAVENOUS

## 2016-06-24 MED ORDER — DIPHENHYDRAMINE HCL 25 MG PO CAPS
ORAL_CAPSULE | ORAL | Status: AC
  Filled 2016-06-24: qty 1

## 2016-06-24 MED ORDER — ACETAMINOPHEN 325 MG PO TABS
650.0000 mg | ORAL_TABLET | Freq: Once | ORAL | Status: AC
Start: 2016-06-24 — End: 2016-06-24
  Administered 2016-06-24: 650 mg via ORAL

## 2016-06-24 NOTE — Patient Instructions (Signed)
[facility] Discharge Instructions for Patients Receiving Chemotherapy  Today you received the following chemotherapy agents: Herceptin   To help prevent nausea and vomiting after your treatment, we encourage you to take your nausea medication as directed.    If you develop nausea and vomiting that is not controlled by your nausea medication, call the clinic.   BELOW ARE SYMPTOMS THAT SHOULD BE REPORTED IMMEDIATELY:  *FEVER GREATER THAN 100.5 F  *CHILLS WITH OR WITHOUT FEVER  NAUSEA AND VOMITING THAT IS NOT CONTROLLED WITH YOUR NAUSEA MEDICATION  *UNUSUAL SHORTNESS OF BREATH  *UNUSUAL BRUISING OR BLEEDING  TENDERNESS IN MOUTH AND THROAT WITH OR WITHOUT PRESENCE OF ULCERS  *URINARY PROBLEMS  *BOWEL PROBLEMS  UNUSUAL RASH Items with * indicate a potential emergency and should be followed up as soon as possible.  Feel free to call the clinic you have any questions or concerns. The clinic phone number is [phone].  Please show the CHEMO ALERT CARD at check-in to the Emergency Department and triage nurse.   

## 2016-06-24 NOTE — Telephone Encounter (Signed)
per pof to sch pt appt-gave pt copy of avs/cal °

## 2016-06-25 ENCOUNTER — Ambulatory Visit
Admission: RE | Admit: 2016-06-25 | Discharge: 2016-06-25 | Disposition: A | Discharge status: Home / Self Care | Payer: PPO | Source: Ambulatory Visit

## 2016-06-25 DIAGNOSIS — Z510 Encounter for antineoplastic radiation therapy: Secondary | ICD-10-CM | POA: Diagnosis not present

## 2016-06-25 DIAGNOSIS — C50211 Malignant neoplasm of upper-inner quadrant of right female breast: Secondary | ICD-10-CM | POA: Diagnosis not present

## 2016-06-26 ENCOUNTER — Ambulatory Visit
Admission: RE | Admit: 2016-06-26 | Discharge: 2016-06-26 | Disposition: A | Discharge status: Home / Self Care | Payer: PPO | Source: Ambulatory Visit

## 2016-06-26 DIAGNOSIS — Z510 Encounter for antineoplastic radiation therapy: Secondary | ICD-10-CM | POA: Diagnosis not present

## 2016-06-26 DIAGNOSIS — C50211 Malignant neoplasm of upper-inner quadrant of right female breast: Secondary | ICD-10-CM | POA: Diagnosis not present

## 2016-06-27 ENCOUNTER — Ambulatory Visit
Admission: RE | Admit: 2016-06-27 | Discharge: 2016-06-27 | Disposition: A | Discharge status: Home / Self Care | Payer: PPO | Source: Ambulatory Visit

## 2016-06-27 ENCOUNTER — Encounter: Payer: Self-pay | Admitting: Radiation Oncology

## 2016-06-27 DIAGNOSIS — Z510 Encounter for antineoplastic radiation therapy: Secondary | ICD-10-CM | POA: Diagnosis not present

## 2016-06-27 DIAGNOSIS — C50211 Malignant neoplasm of upper-inner quadrant of right female breast: Secondary | ICD-10-CM | POA: Diagnosis not present

## 2016-06-28 ENCOUNTER — Ambulatory Visit
Admission: RE | Admit: 2016-06-28 | Discharge: 2016-06-28 | Disposition: A | Discharge status: Home / Self Care | Payer: PPO | Source: Ambulatory Visit | Attending: Radiation Oncology | Admitting: Radiation Oncology

## 2016-06-28 ENCOUNTER — Ambulatory Visit
Admission: RE | Admit: 2016-06-28 | Discharge: 2016-06-28 | Disposition: A | Discharge status: Home / Self Care | Payer: PPO | Source: Ambulatory Visit

## 2016-06-28 ENCOUNTER — Encounter: Payer: Self-pay | Admitting: Radiation Oncology

## 2016-06-28 VITALS — BP 133/70 | HR 107 | Temp 98.6°F | Resp 20 | Wt 203.4 lb

## 2016-06-28 DIAGNOSIS — C50211 Malignant neoplasm of upper-inner quadrant of right female breast: Secondary | ICD-10-CM | POA: Diagnosis not present

## 2016-06-28 DIAGNOSIS — Z510 Encounter for antineoplastic radiation therapy: Secondary | ICD-10-CM | POA: Diagnosis not present

## 2016-06-28 NOTE — Progress Notes (Signed)
   Department of Radiation Oncology  Phone:  [phone] Fax:        [phone]  Weekly Treatment Note    Name: Terri Mayer Date: 06/30/2016 MRN: [id number] DOB: 11-17-1951   Diagnosis:     ICD-9-CM ICD-10-CM   1. Breast cancer of upper-inner quadrant of right female breast (HCC) 174.2 C50.211      Current dose: 25 Gy  Current fraction: 10   MEDICATIONS: Current Outpatient Prescriptions  Medication Sig Dispense Refill  . hyaluronate sodium (RADIAPLEXRX) GEL Apply 1 application topically 2 (two) times daily.    . non-metallic deodorant Jethro Poling) MISC Apply 1 application topically daily as needed.    Marland Kitchen ibuprofen (ADVIL,MOTRIN) 200 MG tablet Take 200 mg by mouth every 6 (six) hours as needed for fever or mild pain. Reported on 03/22/2016    . lidocaine-prilocaine (EMLA) cream   3  . LORazepam (ATIVAN) 0.5 MG tablet Take 0.5 mg by mouth daily as needed. Reported on 03/22/2016    . omeprazole (PRILOSEC) 40 MG capsule Take 1 capsule (40 mg total) by mouth daily. 90 capsule 4   No current facility-administered medications for this encounter.      ALLERGIES: Codeine   LABORATORY DATA:  Lab Results  Component Value Date   WBC 6.1 06/24/2016   HGB 12.3 06/24/2016   HCT 37.8 06/24/2016   MCV 85.2 06/24/2016   PLT 298 06/24/2016   Lab Results  Component Value Date   NA 143 06/24/2016   K 5.2 (H) 06/24/2016   CO2 29 06/24/2016   Lab Results  Component Value Date   ALT 13 06/24/2016   AST 17 06/24/2016   ALKPHOS 63 06/24/2016   BILITOT 0.96 06/24/2016     NARRATIVE: Terri Mayer was seen today for weekly treatment management. The chart was checked and the patient's films were reviewed.  [name] returns for weekly radiation treatment to the right breast. Reports slight rash just starting in the treatment area, with slight itching. Using Radiaplex tid. Appetite good. No other complaints at this time.  9:39 AM BP 133/70 (BP Location: Left Arm, Patient  Position: Sitting, Cuff Size: Normal)   Pulse (!) 107   Temp 98.6 F (37 C) (Oral)   Resp 20   Wt 203 lb 6.4 oz (92.3 kg)   BMI 32.83 kg/m   Wt Readings from Last 3 Encounters:  06/28/16 203 lb 6.4 oz (92.3 kg)  06/24/16 200 lb 3.2 oz (90.8 kg)  06/21/16 202 lb 11.2 oz (91.9 kg)    PHYSICAL EXAMINATION: weight is 203 lb 6.4 oz (92.3 kg). Her oral temperature is 98.6 F (37 C). Her blood pressure is 133/70 and her pulse is 107 (abnormal). Her respiration is 20.      Alert, no acute distress. Mild rash emerging in upper inner aspect of treatment area.  ASSESSMENT: The patient is doing satisfactorily with treatment.  PLAN: We will continue with the patient's radiation treatment as planned. Encouraged the patient to apply hydrocortisone cream to the treatment area if itching persists or worsens.    This document serves as a record of services personally performed by Kyung Rudd, MD. It was created on his behalf by Arlyce Harman, a trained medical scribe. The creation of this record is based on the scribe's personal observations and the provider's statements to them. This document has been checked and approved by the attending provider.  ------------------------------------------------  Jodelle Gross, MD, PhD

## 2016-06-28 NOTE — Progress Notes (Signed)
Weekly rad txs right breast , slight rash just starting, skin intact, using rdiaplex bid, slsight itching, appetite good, no c/o 10:11 AM BP 133/70 (BP Location: Left Arm, Patient Position: Sitting, Cuff Size: Normal)   Pulse (!) 107   Temp 98.6 F (37 C) (Oral)   Resp 20   Wt 203 lb 6.4 oz (92.3 kg)   BMI 32.83 kg/m   Wt Readings from Last 3 Encounters:  06/28/16 203 lb 6.4 oz (92.3 kg)  06/24/16 200 lb 3.2 oz (90.8 kg)  06/21/16 202 lb 11.2 oz (91.9 kg)

## 2016-07-01 ENCOUNTER — Ambulatory Visit
Admission: RE | Admit: 2016-07-01 | Discharge: 2016-07-01 | Disposition: A | Discharge status: Home / Self Care | Payer: PPO | Source: Ambulatory Visit

## 2016-07-01 DIAGNOSIS — Z510 Encounter for antineoplastic radiation therapy: Secondary | ICD-10-CM | POA: Diagnosis not present

## 2016-07-01 DIAGNOSIS — C50211 Malignant neoplasm of upper-inner quadrant of right female breast: Secondary | ICD-10-CM | POA: Diagnosis not present

## 2016-07-02 ENCOUNTER — Ambulatory Visit
Admission: RE | Admit: 2016-07-02 | Discharge: 2016-07-02 | Disposition: A | Discharge status: Home / Self Care | Payer: PPO | Source: Ambulatory Visit

## 2016-07-02 DIAGNOSIS — C50211 Malignant neoplasm of upper-inner quadrant of right female breast: Secondary | ICD-10-CM | POA: Diagnosis not present

## 2016-07-02 DIAGNOSIS — Z510 Encounter for antineoplastic radiation therapy: Secondary | ICD-10-CM | POA: Diagnosis not present

## 2016-07-03 ENCOUNTER — Ambulatory Visit
Admission: RE | Admit: 2016-07-03 | Discharge: 2016-07-03 | Disposition: A | Discharge status: Home / Self Care | Payer: PPO | Source: Ambulatory Visit

## 2016-07-03 ENCOUNTER — Ambulatory Visit
Admission: RE | Admit: 2016-07-03 | Discharge: 2016-07-03 | Disposition: A | Discharge status: Home / Self Care | Payer: PPO | Source: Ambulatory Visit | Attending: Radiation Oncology | Admitting: Radiation Oncology

## 2016-07-03 ENCOUNTER — Encounter: Payer: Self-pay | Admitting: Radiation Oncology

## 2016-07-03 VITALS — BP 130/83 | HR 87 | Temp 98.7°F | Resp 20 | Wt 202.0 lb

## 2016-07-03 DIAGNOSIS — C50211 Malignant neoplasm of upper-inner quadrant of right female breast: Secondary | ICD-10-CM | POA: Diagnosis not present

## 2016-07-03 DIAGNOSIS — Z510 Encounter for antineoplastic radiation therapy: Secondary | ICD-10-CM | POA: Diagnosis not present

## 2016-07-03 NOTE — Progress Notes (Signed)
Weekly rd txs  Right breast 13/20 completed, erythema mild, skin intact,  Using radiaplex bid, no c/o BP 130/83 (BP Location: Left Arm, Patient Position: Sitting, Cuff Size: Normal)   Pulse 87   Temp 98.7 F (37.1 C) (Oral)   Resp 20   Wt 202 lb (91.6 kg)   BMI 32.60 kg/m   Wt Readings from Last 3 Encounters:  07/03/16 202 lb (91.6 kg)  06/28/16 203 lb 6.4 oz (92.3 kg)  06/24/16 200 lb 3.2 oz (90.8 kg)

## 2016-07-03 NOTE — Progress Notes (Signed)
Complex simulation note  Diagnosis: Right-sided breast cancer  Narrative The patient has initially been planned to receive a course of whole breast radiation to a dose of 42.5 Gy in 17 fractions. The patient will now receive an additional boost to the seroma cavity which has been contoured. This will correspond to a boost of 7.5 Gy at 2.5 Gy per fraction. To accomplish this, an additional 3 customized blocks have been designed for this purpose. A complex isodose plan is requested to ensure that the target area is adequately covered with radiation dose and that the nearby normal structures such as the lung are adequately spared. The patient's final total dose will be 50 Gy.  ------------------------------------------------  Arthor Gorter S. Adriane Gabbert, MD, PhD   

## 2016-07-03 NOTE — Progress Notes (Signed)
   Department of Radiation Oncology  Phone:  [phone] Fax:        [phone]  Weekly Treatment Note    Name: Terri Mayer Date: 07/03/2016 MRN: [id number] DOB: [birth year]   Diagnosis:   No diagnosis found.   Current dose: 32.5 Gy  Current fraction: 13   MEDICATIONS: Current Outpatient Prescriptions  Medication Sig Dispense Refill  . hyaluronate sodium (RADIAPLEXRX) GEL Apply 1 application topically 2 (two) times daily.    Marland Kitchen ibuprofen (ADVIL,MOTRIN) 200 MG tablet Take 200 mg by mouth every 6 (six) hours as needed for fever or mild pain. Reported on 03/22/2016    . lidocaine-prilocaine (EMLA) cream   3  . LORazepam (ATIVAN) 0.5 MG tablet Take 0.5 mg by mouth daily as needed. Reported on [id number]    . non-metallic deodorant Jethro Poling) MISC Apply 1 application topically daily as needed.    Marland Kitchen omeprazole (PRILOSEC) 40 MG capsule Take 1 capsule (40 mg total) by mouth daily. 90 capsule 4   No current facility-administered medications for this encounter.      ALLERGIES: Codeine   LABORATORY DATA:  Lab Results  Component Value Date   WBC 6.1 06/24/2016   HGB 12.3 06/24/2016   HCT 37.8 06/24/2016   MCV 85.2 06/24/2016   PLT 298 06/24/2016   Lab Results  Component Value Date   NA 143 06/24/2016   K 5.2 (H) 06/24/2016   CO2 29 06/24/2016   Lab Results  Component Value Date   ALT 13 06/24/2016   AST 17 06/24/2016   ALKPHOS 63 06/24/2016   BILITOT 0.96 06/24/2016     NARRATIVE: Terri Mayer was seen today for weekly treatment management. The chart was checked and the patient's films were reviewed.  Weekly radiation treatments to the right breast 13/20 completed. She has some mild erythema and skin intact. She is using Radiaplex twice daily. She has no complaints.  10:02 AM BP 130/83 (BP Location: Left Arm, Patient Position: Sitting, Cuff Size: Normal)   Pulse 87   Temp 98.7 F (37.1 C) (Oral)   Resp 20   Wt 202 lb (91.6 kg)   BMI 32.60 kg/m   Wt  Readings from Last 3 Encounters:  07/03/16 202 lb (91.6 kg)  06/28/16 203 lb 6.4 oz (92.3 kg)  06/24/16 200 lb 3.2 oz (90.8 kg)    PHYSICAL EXAMINATION: weight is 202 lb (91.6 kg). Her oral temperature is 98.7 F (37.1 C). Her blood pressure is 130/83 and her pulse is 87. Her respiration is 20.      Alert, no acute distress. Mild rash emerging in upper inner aspect of treatment area. Mild hyperpigmentation in the treatment area.   ASSESSMENT: The patient is doing satisfactorily with treatment.  PLAN: We will continue with the patient's radiation treatment as planned.  ------------------------------------------------  Jodelle Gross, MD, PhD    This document serves as a record of services personally performed by Kyung Rudd, MD. It was created on his behalf by Lendon Collar, a trained medical scribe. The creation of this record is based on the scribe's personal observations and the provider's statements to them. This document has been checked and approved by the attending provider.

## 2016-07-04 ENCOUNTER — Ambulatory Visit
Admission: RE | Admit: 2016-07-04 | Discharge: 2016-07-04 | Disposition: A | Discharge status: Home / Self Care | Payer: PPO | Source: Ambulatory Visit

## 2016-07-04 DIAGNOSIS — Z510 Encounter for antineoplastic radiation therapy: Secondary | ICD-10-CM | POA: Diagnosis not present

## 2016-07-04 DIAGNOSIS — C50211 Malignant neoplasm of upper-inner quadrant of right female breast: Secondary | ICD-10-CM | POA: Diagnosis not present

## 2016-07-05 ENCOUNTER — Ambulatory Visit
Admission: RE | Admit: 2016-07-05 | Discharge: 2016-07-05 | Disposition: A | Discharge status: Home / Self Care | Payer: PPO | Source: Ambulatory Visit

## 2016-07-05 DIAGNOSIS — C50211 Malignant neoplasm of upper-inner quadrant of right female breast: Secondary | ICD-10-CM | POA: Diagnosis not present

## 2016-07-05 DIAGNOSIS — Z510 Encounter for antineoplastic radiation therapy: Secondary | ICD-10-CM | POA: Diagnosis not present

## 2016-07-08 ENCOUNTER — Ambulatory Visit
Admission: RE | Admit: 2016-07-08 | Discharge: 2016-07-08 | Disposition: A | Discharge status: Home / Self Care | Payer: PPO | Source: Ambulatory Visit

## 2016-07-08 DIAGNOSIS — C50211 Malignant neoplasm of upper-inner quadrant of right female breast: Secondary | ICD-10-CM | POA: Diagnosis not present

## 2016-07-08 DIAGNOSIS — Z510 Encounter for antineoplastic radiation therapy: Secondary | ICD-10-CM | POA: Diagnosis not present

## 2016-07-09 ENCOUNTER — Ambulatory Visit
Admission: RE | Admit: 2016-07-09 | Discharge: 2016-07-09 | Disposition: A | Discharge status: Home / Self Care | Payer: PPO | Source: Ambulatory Visit | Attending: Radiation Oncology | Admitting: Radiation Oncology

## 2016-07-09 DIAGNOSIS — C50211 Malignant neoplasm of upper-inner quadrant of right female breast: Secondary | ICD-10-CM | POA: Diagnosis not present

## 2016-07-09 DIAGNOSIS — Z510 Encounter for antineoplastic radiation therapy: Secondary | ICD-10-CM | POA: Diagnosis not present

## 2016-07-09 MED ORDER — RADIAPLEXRX EX GEL
Freq: Once | CUTANEOUS | Status: AC
  Administered 2016-07-09: 10:00:00 via TOPICAL

## 2016-07-10 ENCOUNTER — Encounter: Payer: PPO | Admitting: Internal Medicine

## 2016-07-10 ENCOUNTER — Ambulatory Visit
Admission: RE | Admit: 2016-07-10 | Discharge: 2016-07-10 | Disposition: A | Discharge status: Home / Self Care | Payer: PPO | Source: Ambulatory Visit

## 2016-07-10 DIAGNOSIS — C50211 Malignant neoplasm of upper-inner quadrant of right female breast: Secondary | ICD-10-CM | POA: Diagnosis not present

## 2016-07-10 DIAGNOSIS — Z510 Encounter for antineoplastic radiation therapy: Secondary | ICD-10-CM | POA: Diagnosis not present

## 2016-07-11 ENCOUNTER — Other Ambulatory Visit: Payer: Self-pay | Admitting: *Deleted

## 2016-07-11 ENCOUNTER — Ambulatory Visit
Admission: RE | Admit: 2016-07-11 | Discharge: 2016-07-11 | Disposition: A | Discharge status: Home / Self Care | Payer: PPO | Source: Ambulatory Visit

## 2016-07-11 DIAGNOSIS — C50211 Malignant neoplasm of upper-inner quadrant of right female breast: Secondary | ICD-10-CM | POA: Diagnosis not present

## 2016-07-11 DIAGNOSIS — Z510 Encounter for antineoplastic radiation therapy: Secondary | ICD-10-CM | POA: Diagnosis not present

## 2016-07-12 ENCOUNTER — Ambulatory Visit
Admission: RE | Admit: 2016-07-12 | Discharge: 2016-07-12 | Disposition: A | Discharge status: Home / Self Care | Payer: PPO | Source: Ambulatory Visit

## 2016-07-12 ENCOUNTER — Telehealth: Payer: Self-pay | Admitting: *Deleted

## 2016-07-12 ENCOUNTER — Ambulatory Visit
Admission: RE | Admit: 2016-07-12 | Discharge: 2016-07-12 | Disposition: A | Discharge status: Home / Self Care | Payer: PPO | Source: Ambulatory Visit | Attending: Radiation Oncology | Admitting: Radiation Oncology

## 2016-07-12 ENCOUNTER — Encounter: Payer: Self-pay | Admitting: Radiation Oncology

## 2016-07-12 VITALS — BP 109/73 | HR 78 | Temp 98.3°F | Ht 66.0 in | Wt 204.7 lb

## 2016-07-12 DIAGNOSIS — C50211 Malignant neoplasm of upper-inner quadrant of right female breast: Secondary | ICD-10-CM

## 2016-07-12 DIAGNOSIS — Z510 Encounter for antineoplastic radiation therapy: Secondary | ICD-10-CM | POA: Diagnosis not present

## 2016-07-12 NOTE — Progress Notes (Signed)
  Department of Radiation Oncology  Phone:  [phone] Fax:        [phone]  Weekly Treatment Note    Name: Terri Mayer Date: 07/14/2016 MRN: [id number] DOB: August 18, 1951   Current dose: 50 Gy  Current fraction:20   MEDICATIONS: Current Outpatient Prescriptions  Medication Sig Dispense Refill  . hyaluronate sodium (RADIAPLEXRX) GEL Apply 1 application topically 2 (two) times daily.    Marland Kitchen ibuprofen (ADVIL,MOTRIN) 200 MG tablet Take 200 mg by mouth every 6 (six) hours as needed for fever or mild pain. Reported on 03/22/2016    . lidocaine-prilocaine (EMLA) cream   3  . LORazepam (ATIVAN) 0.5 MG tablet Take 0.5 mg by mouth daily as needed. Reported on [id number]    . non-metallic deodorant Jethro Poling) MISC Apply 1 application topically daily as needed.    Marland Kitchen omeprazole (PRILOSEC) 40 MG capsule Take 1 capsule (40 mg total) by mouth daily. 90 capsule 4   No current facility-administered medications for this encounter.      ALLERGIES: Codeine   LABORATORY DATA:  Lab Results  Component Value Date   WBC 6.1 06/24/2016   HGB 12.3 06/24/2016   HCT 37.8 06/24/2016   MCV 85.2 06/24/2016   PLT 298 06/24/2016   Lab Results  Component Value Date   NA 143 06/24/2016   K 5.2 (H) 06/24/2016   CO2 29 06/24/2016   Lab Results  Component Value Date   ALT 13 06/24/2016   AST 17 06/24/2016   ALKPHOS 63 06/24/2016   BILITOT 0.96 06/24/2016     NARRATIVE: Terri Mayer was seen today for weekly treatment management. The chart was checked and the patient's films were reviewed.  Terri Mayer presents for her last fraction of radiation to her Right Breast. She denies pain. She does have fatigue, but is able to manage it and also feels like it is related to Herceptin. Her Right Breast is red and slightly tender. She is using Radiaplex twice daily. She was given a follow up appointment for one month.   PHYSICAL EXAMINATION: height is 5\' 6"  (1.676 m) and weight is 204 lb 11.2 oz (92.9  kg). Her temperature is 98.3 F (36.8 C). Her blood pressure is 109/73 and her pulse is 78. Her oxygen saturation is 100%.    Mild erythema in the treatment area. No desquamation.  ASSESSMENT: The patient did satisfactorily with treatment.  PLAN: The patient will follow-up in our clinic in 1 month.  This document serves as a record of services personally performed by Kyung Rudd, MD. It was created on his behalf by Terri Mayer, a trained medical scribe. The creation of this record is based on the scribe's personal observations and the provider's statements to them. This document has been checked and approved by the attending provider.

## 2016-07-12 NOTE — Progress Notes (Signed)
Ms. Naab presents for her last fraction of radiation to her Right Breast. She denies pain. She does have fatigue, but is able to manage it, and also feels like it is related to Herceptin. Her Right Breast is red and slightly tender. She is using Radiaplex twice daily. She was given a follow up appointment for one month.   BP 109/73   Pulse 78   Temp 98.3 F (36.8 C)   Ht 5\' 6"  (1.676 m)   Wt 204 lb 11.2 oz (92.9 kg)   SpO2 100% Comment: room air  BMI 33.04 kg/m    Wt Readings from Last 3 Encounters:  07/12/16 204 lb 11.2 oz (92.9 kg)  07/03/16 202 lb (91.6 kg)  06/28/16 203 lb 6.4 oz (92.3 kg)

## 2016-07-12 NOTE — Telephone Encounter (Signed)
  Oncology Nurse Navigator Documentation    Navigator Encounter Type: Telephone (07/12/16 1500) Telephone: Outgoing Call (07/12/16 1500)         Patient Visit Type: [id number] (07/12/16 1500) Treatment Phase: Final Radiation Tx (07/12/16 1500)                            Time Spent with Patient: 15 (07/12/16 1500)

## 2016-07-15 ENCOUNTER — Ambulatory Visit (HOSPITAL_BASED_OUTPATIENT_CLINIC_OR_DEPARTMENT_OTHER): Payer: PPO

## 2016-07-15 ENCOUNTER — Other Ambulatory Visit (HOSPITAL_BASED_OUTPATIENT_CLINIC_OR_DEPARTMENT_OTHER): Payer: PPO

## 2016-07-15 VITALS — BP 118/68 | HR 82 | Temp 98.2°F | Resp 18

## 2016-07-15 DIAGNOSIS — C50211 Malignant neoplasm of upper-inner quadrant of right female breast: Secondary | ICD-10-CM | POA: Diagnosis not present

## 2016-07-15 DIAGNOSIS — Z5112 Encounter for antineoplastic immunotherapy: Secondary | ICD-10-CM

## 2016-07-15 LAB — CBC WITH DIFFERENTIAL/PLATELET
BASO%: 0.2 % (ref 0.0–2.0)
Basophils Absolute: 0 10*3/uL (ref 0.0–0.1)
EOS ABS: 0.2 10*3/uL (ref 0.0–0.5)
EOS%: 3.7 % (ref 0.0–7.0)
HEMATOCRIT: 36.7 % (ref 34.8–46.6)
HGB: 12 g/dL (ref 11.6–15.9)
LYMPH#: 1.1 10*3/uL (ref 0.9–3.3)
LYMPH%: 17.8 % (ref 14.0–49.7)
MCH: 27.5 pg (ref 25.1–34.0)
MCHC: 32.7 g/dL (ref 31.5–36.0)
MCV: 84.2 fL (ref 79.5–101.0)
MONO#: 0.6 10*3/uL (ref 0.1–0.9)
MONO%: 10.1 % (ref 0.0–14.0)
NEUT%: 68.2 % (ref 38.4–76.8)
NEUTROS ABS: 4.1 10*3/uL (ref 1.5–6.5)
PLATELETS: 209 10*3/uL (ref 145–400)
RBC: 4.36 10*6/uL (ref 3.70–5.45)
RDW: 13.8 % (ref 11.2–14.5)
WBC: 6 10*3/uL (ref 3.9–10.3)

## 2016-07-15 LAB — COMPREHENSIVE METABOLIC PANEL
ALBUMIN: 3.5 g/dL (ref 3.5–5.0)
ALK PHOS: 58 U/L (ref 40–150)
ALT: 11 U/L (ref 0–55)
ANION GAP: 8 meq/L (ref 3–11)
AST: 17 U/L (ref 5–34)
BILIRUBIN TOTAL: 0.86 mg/dL (ref 0.20–1.20)
BUN: 22.2 mg/dL (ref 7.0–26.0)
CALCIUM: 9.9 mg/dL (ref 8.4–10.4)
CO2: 30 meq/L — AB (ref 22–29)
CREATININE: 0.8 mg/dL (ref 0.6–1.1)
Chloride: 105 mEq/L (ref 98–109)
EGFR: 73 mL/min/{1.73_m2} — AB (ref >90)
Glucose: 81 mg/dl (ref 70–140)
Potassium: 4.3 mEq/L (ref 3.5–5.1)
Sodium: 143 mEq/L (ref 136–145)
TOTAL PROTEIN: 6.8 g/dL (ref 6.4–8.3)

## 2016-07-15 MED ORDER — DIPHENHYDRAMINE HCL 25 MG PO CAPS
ORAL_CAPSULE | ORAL | Status: AC
  Filled 2016-07-15: qty 1

## 2016-07-15 MED ORDER — SODIUM CHLORIDE 0.9 % IV SOLN
Freq: Once | INTRAVENOUS | Status: AC
  Administered 2016-07-15: 13:00:00 via INTRAVENOUS

## 2016-07-15 MED ORDER — SODIUM CHLORIDE 0.9% FLUSH
10.0000 mL | INTRAVENOUS | Status: DC | PRN
  Administered 2016-07-15: 10 mL
  Filled 2016-07-15: qty 10

## 2016-07-15 MED ORDER — ACETAMINOPHEN 325 MG PO TABS
650.0000 mg | ORAL_TABLET | Freq: Once | ORAL | Status: AC
  Administered 2016-07-15: 650 mg via ORAL

## 2016-07-15 MED ORDER — ACETAMINOPHEN 325 MG PO TABS
ORAL_TABLET | ORAL | Status: AC
  Filled 2016-07-15: qty 2

## 2016-07-15 MED ORDER — DIPHENHYDRAMINE HCL 25 MG PO CAPS
25.0000 mg | ORAL_CAPSULE | Freq: Once | ORAL | Status: AC
  Administered 2016-07-15: 25 mg via ORAL

## 2016-07-15 MED ORDER — HEPARIN SOD (PORK) LOCK FLUSH 100 UNIT/ML IV SOLN
500.0000 [IU] | Freq: Once | INTRAVENOUS | Status: AC | PRN
  Administered 2016-07-15: 500 [IU]
  Filled 2016-07-15: qty 5

## 2016-07-15 MED ORDER — SODIUM CHLORIDE 0.9 % IV SOLN
6.0000 mg/kg | Freq: Once | INTRAVENOUS | Status: AC
  Administered 2016-07-15: 567 mg via INTRAVENOUS
  Filled 2016-07-15: qty 27

## 2016-07-15 NOTE — Patient Instructions (Signed)
[facility] Discharge Instructions for Patients Receiving Chemotherapy  Today you received the following chemotherapy agents: Herceptin   To help prevent nausea and vomiting after your treatment, we encourage you to take your nausea medication as directed.    If you develop nausea and vomiting that is not controlled by your nausea medication, call the clinic.   BELOW ARE SYMPTOMS THAT SHOULD BE REPORTED IMMEDIATELY:  *FEVER GREATER THAN 100.5 F  *CHILLS WITH OR WITHOUT FEVER  NAUSEA AND VOMITING THAT IS NOT CONTROLLED WITH YOUR NAUSEA MEDICATION  *UNUSUAL SHORTNESS OF BREATH  *UNUSUAL BRUISING OR BLEEDING  TENDERNESS IN MOUTH AND THROAT WITH OR WITHOUT PRESENCE OF ULCERS  *URINARY PROBLEMS  *BOWEL PROBLEMS  UNUSUAL RASH Items with * indicate a potential emergency and should be followed up as soon as possible.  Feel free to call the clinic you have any questions or concerns. The clinic phone number is [phone].  Please show the CHEMO ALERT CARD at check-in to the Emergency Department and triage nurse.   

## 2016-07-17 ENCOUNTER — Encounter: Payer: Self-pay | Admitting: Radiation Oncology

## 2016-07-17 NOTE — Progress Notes (Signed)
°  Radiation Oncology         [phone] ________________________________  Name: Terri Mayer MRN: [id number]  Date: 07/17/2016  DOB: 1951-07-10  End of Treatment Note  Diagnosis:   Malignant neoplasm of upper-inner quadrant of right breast    Indication for treatment:  curative       Radiation treatment dates:   06/17/3016-07/12/2016  Site/dose: 1. Right breast boost// 7.5 Gy with 3 fractions at a dose of 2.5 Gy/ fraction 2. Right breast// 42.5 Gy with 17 fractions at a dose of 2.5 Gy/ fraction  Beams/energy: 1. Isodose plan// 10 X, 6X 2. 3D // 10 X  Narrative: The patient tolerated radiation treatment relatively well.     Plan: The patient has completed radiation treatment. The patient will return to radiation oncology clinic for routine followup in one month. I advised them to call or return sooner if they have any questions or concerns related to their recovery or treatment.  ------------------------------------------------  Jodelle Gross, MD, PhD  This document serves as a record of services personally performed by Kyung Rudd, MD. It was created on his behalf by Bethann Humble, a trained medical scribe. The creation of this record is based on the scribe's personal observations and the provider's statements to them. This document has been checked and approved by the attending provider.

## 2016-08-02 ENCOUNTER — Other Ambulatory Visit: Payer: Self-pay | Admitting: *Deleted

## 2016-08-02 DIAGNOSIS — C50211 Malignant neoplasm of upper-inner quadrant of right female breast: Secondary | ICD-10-CM

## 2016-08-05 ENCOUNTER — Ambulatory Visit (HOSPITAL_BASED_OUTPATIENT_CLINIC_OR_DEPARTMENT_OTHER): Payer: PPO

## 2016-08-05 ENCOUNTER — Other Ambulatory Visit (HOSPITAL_BASED_OUTPATIENT_CLINIC_OR_DEPARTMENT_OTHER): Payer: PPO

## 2016-08-05 VITALS — BP 118/55 | HR 85 | Temp 98.9°F

## 2016-08-05 DIAGNOSIS — C50211 Malignant neoplasm of upper-inner quadrant of right female breast: Secondary | ICD-10-CM

## 2016-08-05 DIAGNOSIS — Z5111 Encounter for antineoplastic chemotherapy: Secondary | ICD-10-CM | POA: Diagnosis not present

## 2016-08-05 LAB — COMPREHENSIVE METABOLIC PANEL
ALT: 14 U/L (ref 0–55)
AST: 20 U/L (ref 5–34)
Albumin: 3.7 g/dL (ref 3.5–5.0)
Alkaline Phosphatase: 75 U/L (ref 40–150)
Anion Gap: 9 mEq/L (ref 3–11)
BUN: 27.6 mg/dL — AB (ref 7.0–26.0)
CHLORIDE: 106 meq/L (ref 98–109)
CO2: 29 meq/L (ref 22–29)
CREATININE: 1.1 mg/dL (ref 0.6–1.1)
Calcium: 10 mg/dL (ref 8.4–10.4)
EGFR: 52 mL/min/{1.73_m2} — ABNORMAL LOW (ref >90)
Glucose: 63 mg/dl — ABNORMAL LOW (ref 70–140)
Potassium: 4.6 mEq/L (ref 3.5–5.1)
Sodium: 145 mEq/L (ref 136–145)
TOTAL PROTEIN: 7.2 g/dL (ref 6.4–8.3)
Total Bilirubin: 0.71 mg/dL (ref 0.20–1.20)

## 2016-08-05 LAB — CBC WITH DIFFERENTIAL/PLATELET
BASO%: 0.5 % (ref 0.0–2.0)
BASOS ABS: 0 10*3/uL (ref 0.0–0.1)
EOS ABS: 0.2 10*3/uL (ref 0.0–0.5)
EOS%: 3.7 % (ref 0.0–7.0)
HEMATOCRIT: 40.7 % (ref 34.8–46.6)
HGB: 13.1 g/dL (ref 11.6–15.9)
LYMPH%: 16 % (ref 14.0–49.7)
MCH: 26 pg (ref 25.1–34.0)
MCHC: 32.2 g/dL (ref 31.5–36.0)
MCV: 80.8 fL (ref 79.5–101.0)
MONO#: 0.7 10*3/uL (ref 0.1–0.9)
MONO%: 10.1 % (ref 0.0–14.0)
NEUT%: 69.7 % (ref 38.4–76.8)
NEUTROS ABS: 4.7 10*3/uL (ref 1.5–6.5)
Platelets: 271 10*3/uL (ref 145–400)
RBC: 5.03 10*6/uL (ref 3.70–5.45)
RDW: 14.6 % — ABNORMAL HIGH (ref 11.2–14.5)
WBC: 6.8 10*3/uL (ref 3.9–10.3)
lymph#: 1.1 10*3/uL (ref 0.9–3.3)

## 2016-08-05 MED ORDER — ACETAMINOPHEN 325 MG PO TABS
ORAL_TABLET | ORAL | Status: AC
  Filled 2016-08-05: qty 2

## 2016-08-05 MED ORDER — SODIUM CHLORIDE 0.9 % IV SOLN
Freq: Once | INTRAVENOUS | Status: AC
  Administered 2016-08-05: 13:00:00 via INTRAVENOUS

## 2016-08-05 MED ORDER — DIPHENHYDRAMINE HCL 25 MG PO CAPS
ORAL_CAPSULE | ORAL | Status: AC
  Filled 2016-08-05: qty 1

## 2016-08-05 MED ORDER — HEPARIN SOD (PORK) LOCK FLUSH 100 UNIT/ML IV SOLN
500.0000 [IU] | Freq: Once | INTRAVENOUS | Status: AC | PRN
  Administered 2016-08-05: 500 [IU]
  Filled 2016-08-05: qty 5

## 2016-08-05 MED ORDER — SODIUM CHLORIDE 0.9% FLUSH
10.0000 mL | INTRAVENOUS | Status: DC | PRN
  Administered 2016-08-05: 10 mL
  Filled 2016-08-05: qty 10

## 2016-08-05 MED ORDER — TRASTUZUMAB CHEMO 150 MG IV SOLR
6.0000 mg/kg | Freq: Once | INTRAVENOUS | Status: AC
  Administered 2016-08-05: 567 mg via INTRAVENOUS
  Filled 2016-08-05: qty 27

## 2016-08-05 MED ORDER — ACETAMINOPHEN 325 MG PO TABS
650.0000 mg | ORAL_TABLET | Freq: Once | ORAL | Status: AC
  Administered 2016-08-05: 650 mg via ORAL

## 2016-08-05 MED ORDER — DIPHENHYDRAMINE HCL 25 MG PO CAPS
25.0000 mg | ORAL_CAPSULE | Freq: Once | ORAL | Status: AC
  Administered 2016-08-05: 25 mg via ORAL

## 2016-08-05 NOTE — Progress Notes (Signed)
Pt last had flu vaccine 11/2015, before starting treatment. RN will talk w/ Dr Ron Agee about next time vaccine due. Pt also wanted to check with her insurance co to be sure it would be covered.

## 2016-08-13 ENCOUNTER — Ambulatory Visit
Admission: RE | Admit: 2016-08-13 | Discharge: 2016-08-13 | Disposition: A | Discharge status: Home / Self Care | Payer: PPO | Source: Ambulatory Visit | Attending: Radiation Oncology | Admitting: Radiation Oncology

## 2016-08-13 ENCOUNTER — Encounter: Payer: Self-pay | Admitting: Radiation Oncology

## 2016-08-13 VITALS — BP 112/44 | HR 75 | Temp 98.5°F | Ht 66.0 in | Wt 203.0 lb

## 2016-08-13 DIAGNOSIS — Z79899 Other long term (current) drug therapy: Secondary | ICD-10-CM | POA: Diagnosis not present

## 2016-08-13 DIAGNOSIS — Z171 Estrogen receptor negative status [ER-]: Secondary | ICD-10-CM | POA: Insufficient documentation

## 2016-08-13 DIAGNOSIS — C50911 Malignant neoplasm of unspecified site of right female breast: Secondary | ICD-10-CM | POA: Diagnosis not present

## 2016-08-13 DIAGNOSIS — Z885 Allergy status to narcotic agent status: Secondary | ICD-10-CM | POA: Insufficient documentation

## 2016-08-13 DIAGNOSIS — C50211 Malignant neoplasm of upper-inner quadrant of right female breast: Secondary | ICD-10-CM

## 2016-08-13 NOTE — Progress Notes (Signed)
Ms. Servatius reports fatigue which is slowly resolving and report intermittent pain in her right breast without need to medicate.  Note erythema with mild hyperpigmentation of her right breast.  Skin soft and intact.    BP (!) 112/44 (BP Location: Left Arm, Patient Position: Sitting, Cuff Size: Normal)   Pulse 75   Temp 98.5 F (36.9 C) (Oral)   Ht 5\' 6"  (1.676 m)   Wt 203 lb (92.1 kg)   BMI 32.77 kg/m     Wt Readings from Last 3 Encounters:  08/13/16 203 lb (92.1 kg)  07/12/16 204 lb 11.2 oz (92.9 kg)  07/03/16 202 lb (91.6 kg)

## 2016-08-13 NOTE — Progress Notes (Signed)
  Radiation Oncology         [phone] ________________________________  Name: Terri Mayer MRN: [id number]  Date: 08/13/2016  DOB: 11/12/1951  Post Treatment Note  CC: [name], MD  Magrinat, Virgie Dad, MD  Diagnosis:   Stage IIA, T1c,pN1, ER/PR negative, HER2 amplified invasive ductal carcinoma of the right breast.  Interval Since Last Radiation: 4 weeks   06/17/3016-07/12/2016: 1. Right breast boost// 7.5 Gy with 3 fractions at a dose of 2.5 Gy/ fraction 2. Right breast// 42.5 Gy with 17 fractions at a dose of 2.5 Gy/ fraction  Narrative:  The patient returns today for routine follow-up.  She did well tolerating treatment and did not have desquamation.  She continues on Herceptin through January 2018.                            On review of systems, the patient states she feels quite good. She denies any chest pain, shortness of breath, fevers, chills, or skin concerns.  ALLERGIES:  is allergic to codeine.  Meds: Current Outpatient Prescriptions  Medication Sig Dispense Refill  . hyaluronate sodium (RADIAPLEXRX) GEL Apply 1 application topically 2 (two) times daily.    Marland Kitchen ibuprofen (ADVIL,MOTRIN) 200 MG tablet Take 200 mg by mouth every 6 (six) hours as needed for fever or mild pain. Reported on 03/22/2016    . lidocaine-prilocaine (EMLA) cream   3  . LORazepam (ATIVAN) 0.5 MG tablet Take 0.5 mg by mouth daily as needed. Reported on 04/27/7947    . non-metallic deodorant Jethro Poling) MISC Apply 1 application topically daily as needed.    Marland Kitchen omeprazole (PRILOSEC) 40 MG capsule Take 1 capsule (40 mg total) by mouth daily. 90 capsule 4   No current facility-administered medications for this encounter.     Physical Findings:  height is '5\' 6"'$  (1.676 m) and weight is 203 lb (92.1 kg). Her oral temperature is 98.5 F (36.9 C). Her blood pressure is 112/44 (abnormal) and her pulse is 75.  In general this is a well appearing caucasian female in no acute distress. She's alert and  oriented x4 and appropriate throughout the examination. Cardiopulmonary assessment is negative for acute distress and she exhibits normal effort. Her right breast is assessed and is intact without desquamation or significant hyperpigmentation.  Lab Findings: Lab Results  Component Value Date   WBC 6.8 08/05/2016   HGB 13.1 08/05/2016   HCT 40.7 08/05/2016   MCV 80.8 08/05/2016   PLT 271 08/05/2016     Radiographic Findings: No results found.  Impression/Plan: 1. Stage IIA, T1c,pN1, ER/PR negative, HER2 amplified invasive ductal carcinoma of the right breast. The patient is doing well overall. She will continue to follow up with Dr. Jana Hakim. She will ask him about the time frame she can receive a flu shot as well. We would be happy to see her back if she has questions or concerns about her treatment plan. 2. Survivorship. She will be set up for survivorship clinic once she completes Herceptin.     Carola Rhine, PAC

## 2016-08-13 NOTE — Addendum Note (Signed)
Encounter addended by: Malena Edman, RN on: 08/13/2016 10:56 AM<BR>    Actions taken: Charge Capture section accepted

## 2016-08-14 ENCOUNTER — Ambulatory Visit: Payer: Self-pay | Admitting: Radiation Oncology

## 2016-08-19 ENCOUNTER — Telehealth: Payer: Self-pay | Admitting: *Deleted

## 2016-08-26 ENCOUNTER — Ambulatory Visit (HOSPITAL_BASED_OUTPATIENT_CLINIC_OR_DEPARTMENT_OTHER): Payer: PPO

## 2016-08-26 ENCOUNTER — Other Ambulatory Visit (HOSPITAL_BASED_OUTPATIENT_CLINIC_OR_DEPARTMENT_OTHER): Payer: PPO

## 2016-08-26 VITALS — BP 125/61 | HR 74 | Temp 98.8°F | Resp 17

## 2016-08-26 DIAGNOSIS — C50211 Malignant neoplasm of upper-inner quadrant of right female breast: Secondary | ICD-10-CM | POA: Diagnosis not present

## 2016-08-26 DIAGNOSIS — Z5112 Encounter for antineoplastic immunotherapy: Secondary | ICD-10-CM | POA: Diagnosis not present

## 2016-08-26 LAB — COMPREHENSIVE METABOLIC PANEL
ALBUMIN: 3.5 g/dL (ref 3.5–5.0)
ALK PHOS: 62 U/L (ref 40–150)
ALT: 13 U/L (ref 0–55)
ANION GAP: 8 meq/L (ref 3–11)
AST: 19 U/L (ref 5–34)
BILIRUBIN TOTAL: 0.73 mg/dL (ref 0.20–1.20)
BUN: 25.5 mg/dL (ref 7.0–26.0)
CALCIUM: 9.6 mg/dL (ref 8.4–10.4)
CO2: 27 meq/L (ref 22–29)
CREATININE: 0.9 mg/dL (ref 0.6–1.1)
Chloride: 107 mEq/L (ref 98–109)
EGFR: 69 mL/min/{1.73_m2} — AB (ref >90)
Glucose: 94 mg/dl (ref 70–140)
Potassium: 4.9 mEq/L (ref 3.5–5.1)
Sodium: 143 mEq/L (ref 136–145)
TOTAL PROTEIN: 6.8 g/dL (ref 6.4–8.3)

## 2016-08-26 LAB — CBC WITH DIFFERENTIAL/PLATELET
BASO%: 0.5 % (ref 0.0–2.0)
Basophils Absolute: 0 10*3/uL (ref 0.0–0.1)
EOS ABS: 0.2 10*3/uL (ref 0.0–0.5)
EOS%: 3 % (ref 0.0–7.0)
HEMATOCRIT: 39.5 % (ref 34.8–46.6)
HEMOGLOBIN: 12.8 g/dL (ref 11.6–15.9)
LYMPH#: 1.4 10*3/uL (ref 0.9–3.3)
LYMPH%: 24.9 % (ref 14.0–49.7)
MCH: 25.8 pg (ref 25.1–34.0)
MCHC: 32.4 g/dL (ref 31.5–36.0)
MCV: 79.5 fL (ref 79.5–101.0)
MONO#: 0.6 10*3/uL (ref 0.1–0.9)
MONO%: 11.5 % (ref 0.0–14.0)
NEUT%: 60.1 % (ref 38.4–76.8)
NEUTROS ABS: 3.4 10*3/uL (ref 1.5–6.5)
PLATELETS: 244 10*3/uL (ref 145–400)
RBC: 4.96 10*6/uL (ref 3.70–5.45)
RDW: 14.7 % — AB (ref 11.2–14.5)
WBC: 5.6 10*3/uL (ref 3.9–10.3)

## 2016-08-26 MED ORDER — DIPHENHYDRAMINE HCL 25 MG PO CAPS
25.0000 mg | ORAL_CAPSULE | Freq: Once | ORAL | Status: AC
  Administered 2016-08-26: 25 mg via ORAL

## 2016-08-26 MED ORDER — TRASTUZUMAB CHEMO 150 MG IV SOLR
6.0000 mg/kg | Freq: Once | INTRAVENOUS | Status: AC
  Administered 2016-08-26: 567 mg via INTRAVENOUS
  Filled 2016-08-26: qty 27

## 2016-08-26 MED ORDER — HEPARIN SOD (PORK) LOCK FLUSH 100 UNIT/ML IV SOLN
500.0000 [IU] | Freq: Once | INTRAVENOUS | Status: AC | PRN
  Administered 2016-08-26: 500 [IU]
  Filled 2016-08-26: qty 5

## 2016-08-26 MED ORDER — ACETAMINOPHEN 325 MG PO TABS
650.0000 mg | ORAL_TABLET | Freq: Once | ORAL | Status: AC
  Administered 2016-08-26: 650 mg via ORAL

## 2016-08-26 MED ORDER — SODIUM CHLORIDE 0.9 % IV SOLN
Freq: Once | INTRAVENOUS | Status: AC
  Administered 2016-08-26: 14:00:00 via INTRAVENOUS

## 2016-08-26 MED ORDER — SODIUM CHLORIDE 0.9% FLUSH
10.0000 mL | INTRAVENOUS | Status: DC | PRN
  Administered 2016-08-26: 10 mL
  Filled 2016-08-26: qty 10

## 2016-08-26 MED ORDER — DIPHENHYDRAMINE HCL 25 MG PO CAPS
ORAL_CAPSULE | ORAL | Status: AC
  Filled 2016-08-26: qty 1

## 2016-08-26 MED ORDER — ACETAMINOPHEN 325 MG PO TABS
ORAL_TABLET | ORAL | Status: AC
  Filled 2016-08-26: qty 2

## 2016-09-04 DIAGNOSIS — H40011 Open angle with borderline findings, low risk, right eye: Secondary | ICD-10-CM | POA: Diagnosis not present

## 2016-09-04 DIAGNOSIS — H35373 Puckering of macula, bilateral: Secondary | ICD-10-CM | POA: Diagnosis not present

## 2016-09-04 DIAGNOSIS — H0289 Other specified disorders of eyelid: Secondary | ICD-10-CM | POA: Diagnosis not present

## 2016-09-04 DIAGNOSIS — H53002 Unspecified amblyopia, left eye: Secondary | ICD-10-CM | POA: Diagnosis not present

## 2016-09-04 DIAGNOSIS — H2513 Age-related nuclear cataract, bilateral: Secondary | ICD-10-CM | POA: Diagnosis not present

## 2016-09-04 DIAGNOSIS — H04123 Dry eye syndrome of bilateral lacrimal glands: Secondary | ICD-10-CM | POA: Diagnosis not present

## 2016-09-16 ENCOUNTER — Other Ambulatory Visit (HOSPITAL_BASED_OUTPATIENT_CLINIC_OR_DEPARTMENT_OTHER): Payer: PPO

## 2016-09-16 ENCOUNTER — Ambulatory Visit (HOSPITAL_BASED_OUTPATIENT_CLINIC_OR_DEPARTMENT_OTHER): Payer: PPO | Admitting: Oncology

## 2016-09-16 ENCOUNTER — Ambulatory Visit (HOSPITAL_BASED_OUTPATIENT_CLINIC_OR_DEPARTMENT_OTHER): Payer: PPO

## 2016-09-16 ENCOUNTER — Ambulatory Visit: Payer: PPO

## 2016-09-16 VITALS — BP 123/55 | HR 73 | Temp 98.1°F | Resp 18 | Ht 66.0 in | Wt 205.1 lb

## 2016-09-16 DIAGNOSIS — M545 Low back pain: Secondary | ICD-10-CM

## 2016-09-16 DIAGNOSIS — Z5112 Encounter for antineoplastic immunotherapy: Secondary | ICD-10-CM

## 2016-09-16 DIAGNOSIS — C50211 Malignant neoplasm of upper-inner quadrant of right female breast: Secondary | ICD-10-CM | POA: Diagnosis not present

## 2016-09-16 DIAGNOSIS — M25511 Pain in right shoulder: Secondary | ICD-10-CM | POA: Diagnosis not present

## 2016-09-16 DIAGNOSIS — M2550 Pain in unspecified joint: Secondary | ICD-10-CM

## 2016-09-16 DIAGNOSIS — Z171 Estrogen receptor negative status [ER-]: Secondary | ICD-10-CM | POA: Diagnosis not present

## 2016-09-16 LAB — CBC WITH DIFFERENTIAL/PLATELET
BASO%: 0.3 % (ref 0.0–2.0)
BASOS ABS: 0 10*3/uL (ref 0.0–0.1)
EOS ABS: 0.2 10*3/uL (ref 0.0–0.5)
EOS%: 3.4 % (ref 0.0–7.0)
HEMATOCRIT: 39.7 % (ref 34.8–46.6)
HGB: 12.9 g/dL (ref 11.6–15.9)
LYMPH#: 1.2 10*3/uL (ref 0.9–3.3)
LYMPH%: 20.7 % (ref 14.0–49.7)
MCH: 25.8 pg (ref 25.1–34.0)
MCHC: 32.5 g/dL (ref 31.5–36.0)
MCV: 79.3 fL — AB (ref 79.5–101.0)
MONO#: 0.5 10*3/uL (ref 0.1–0.9)
MONO%: 9.2 % (ref 0.0–14.0)
NEUT#: 3.9 10*3/uL (ref 1.5–6.5)
NEUT%: 66.4 % (ref 38.4–76.8)
PLATELETS: 239 10*3/uL (ref 145–400)
RBC: 5 10*6/uL (ref 3.70–5.45)
RDW: 14.8 % — ABNORMAL HIGH (ref 11.2–14.5)
WBC: 5.8 10*3/uL (ref 3.9–10.3)

## 2016-09-16 LAB — COMPREHENSIVE METABOLIC PANEL
ALT: 14 U/L (ref 0–55)
ANION GAP: 9 meq/L (ref 3–11)
AST: 21 U/L (ref 5–34)
Albumin: 3.6 g/dL (ref 3.5–5.0)
Alkaline Phosphatase: 73 U/L (ref 40–150)
BILIRUBIN TOTAL: 0.91 mg/dL (ref 0.20–1.20)
BUN: 21.7 mg/dL (ref 7.0–26.0)
CALCIUM: 9.5 mg/dL (ref 8.4–10.4)
CHLORIDE: 106 meq/L (ref 98–109)
CO2: 28 mEq/L (ref 22–29)
CREATININE: 0.9 mg/dL (ref 0.6–1.1)
EGFR: 67 mL/min/{1.73_m2} — AB (ref >90)
Glucose: 91 mg/dl (ref 70–140)
Potassium: 4.8 mEq/L (ref 3.5–5.1)
Sodium: 143 mEq/L (ref 136–145)
TOTAL PROTEIN: 7 g/dL (ref 6.4–8.3)

## 2016-09-16 MED ORDER — DIPHENHYDRAMINE HCL 25 MG PO CAPS
ORAL_CAPSULE | ORAL | Status: AC
  Filled 2016-09-16: qty 1

## 2016-09-16 MED ORDER — TRASTUZUMAB CHEMO 150 MG IV SOLR
6.0000 mg/kg | Freq: Once | INTRAVENOUS | Status: AC
  Administered 2016-09-16: 567 mg via INTRAVENOUS
  Filled 2016-09-16: qty 27

## 2016-09-16 MED ORDER — HEPARIN SOD (PORK) LOCK FLUSH 100 UNIT/ML IV SOLN
500.0000 [IU] | Freq: Once | INTRAVENOUS | Status: AC | PRN
  Administered 2016-09-16: 500 [IU]
  Filled 2016-09-16: qty 5

## 2016-09-16 MED ORDER — DIPHENHYDRAMINE HCL 25 MG PO CAPS
25.0000 mg | ORAL_CAPSULE | Freq: Once | ORAL | Status: AC
  Administered 2016-09-16: 25 mg via ORAL

## 2016-09-16 MED ORDER — ACETAMINOPHEN 325 MG PO TABS
ORAL_TABLET | ORAL | Status: AC
  Filled 2016-09-16: qty 2

## 2016-09-16 MED ORDER — SODIUM CHLORIDE 0.9 % IV SOLN
Freq: Once | INTRAVENOUS | Status: AC
  Administered 2016-09-16: 11:00:00 via INTRAVENOUS

## 2016-09-16 MED ORDER — SODIUM CHLORIDE 0.9% FLUSH
10.0000 mL | INTRAVENOUS | Status: DC | PRN
  Administered 2016-09-16: 10 mL
  Filled 2016-09-16: qty 10

## 2016-09-16 MED ORDER — ACETAMINOPHEN 325 MG PO TABS
650.0000 mg | ORAL_TABLET | Freq: Once | ORAL | Status: AC
  Administered 2016-09-16: 650 mg via ORAL

## 2016-09-16 NOTE — Progress Notes (Signed)
Terri Mayer  [phone] [phone]   ID: ZAINEB NOWACZYK DOB: Mar 26, 1951  MR#: [id number]  AGT#:[id number]  Patient Care Team: [name], MD as PCP - General (Internal Medicine) Chauncey Cruel, MD as Consulting Physician (Oncology) Rolm Bookbinder, MD as Consulting Physician (General Surgery) Princess Bruins, MD as Consulting Physician (Obstetrics and Gynecology) Jolaine Artist, MD as Consulting Physician (Cardiology) Laurence Spates, MD as Consulting Physician (Gastroenterology) PCP: [name], MD GYN: SU:  OTHER MD:  CHIEF COMPLAINT: right breast cancer  CURRENT TREATMENT: Trastuzumab  BREAST CANCER HISTORY: From the original intake note:  "Terri Mayer" underwent bilateral screening mammography with tomosynthesis at the breast center 11/02/2015 showing a possible mass in the right breast and low right axillary area. She was called back for right diagnostic mammography with tomosynthesis and right ultrasonography 11/13/65. The breast density was category B. There was a lobulated mass in the lateral right breast, with a smaller oval mass immediately adjacent to it. Also in the upper outer quadrant there was a third mass. This has been stable back to 2014 and is felt to be a fibroadenoma.  Ultrasound of the right breast confirmed a hypoechoic mass at the 3:00 position 6 cm from the nipple, measuring 1.4 cm. Adjacent to this there was a 0.5 cm internal mammary lymph node, with normal morphology and normal hilar fat. The mass at the 10:00 position measured 0.8 cm and again was consistent with a fibroadenoma. In the right axilla there was an abnormal lymph node with no internal fat measuring 1.8 cm. The rest of the exam was unremarkable.  Biopsy of the right breast 8:30 o'clock lesion and the right axillary lymph node on 11/16/2015 showed ([id number]) both to be positive for invasive ductal carcinoma, grade 3, the breast mass  being estrogen receptor 10% positive with moderate staining intensity, but progesterone receptor negative, but the lymph node being estrogen and progesterone both negative. Both the breast and lymph nodes had MIB-1 of 30%. Both were positive for HER-2 amplification, the signals ratio is being 6.06 and 7.61, and the number per cell 10.00 and 11.80.  The patient's subsequent history is as detailed below   INTERVAL HISTORY: Terri Mayer returns for follow up of her breast cancer. She completed her radiation treatments in August and she has pretty much recovered from those. She occasionally has a "twinge" in her right breast which she knows is not significant and requires no intervention  Her most recent echocardiogram was 06/21/65, with an ejection fraction in the 50-55% range. She will be due for repeat next month  REVIEW OF SYSTEMS: Terri Mayer was doing some clearing in her yard, fell backwards, and injured her right shoulder. She has trouble abductor because of pain. Her hair has come in very strong and she is letting it grow. She has some back and joint pain which is not more persistent than before. Aside from these issues a detailed review of systems today was stable  PAST MEDICAL HISTORY: Past Medical History:  Diagnosis Date  . Allergy   . Breast cancer ([place])   . History of chicken pox   . Osteoarthritis    Neck,Back,Knees,Hips,Ankles    PAST SURGICAL HISTORY: Past Surgical History:  Procedure Laterality Date  . DILATION AND CURETTAGE OF UTERUS    . EYE SURGERY  at age 62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  . PORTACATH PLACEMENT Right 12/11/2015   Procedure: INSERTION PORT-A-CATH WITH ULTRASOUND;  Surgeon: Rolm Bookbinder, MD;  Location: [facility];  Service: General;  Laterality: Right;  . RADIOACTIVE SEED GUIDED PARTIAL MASTECTOMY/AXILLARY SENTINEL NODE BIOPSY/AXILLARY NODE DISSECTION Right 05/02/2016   Procedure: RIGHT BREAST SEED GUIDED LUMPECTOMY AND RIGHT SEED TARGETED AXILLARY  DISSECTION,AND SENTINEL NODE BIOPSY;  Surgeon: Rolm Bookbinder, MD;  Location: [facility];  Service: General;  Laterality: Right;  RIGHT BREAST SEED GUIDED LUMPECTOMY AND RIGHT SEED TARGETED AXILLARY DISSECTION,AND SENTINEL NODE BIOPSY  . WISDOM TOOTH EXTRACTION      FAMILY HISTORY Family History  Problem Relation Age of Onset  . Heart disease Father   . Diabetes      GM  . Colon cancer Neg Hx   . Breast cancer Neg Hx   . Dementia      M  . Transient ischemic attack      F   the patient's father died after multiple strokes at the age of 47. The patient's mother died at the age of 30 with Alzheimer's disease. The patient had one brother, one sister. The patient's paternal grandfather died from lung cancer in his 65s. On the mother's side there are 2 (second) cousins with breast cancer diagnosed in their early 64s, and a more remote cousin diagnosed with breast cancer at age 5. There is no history of ovarian cancer.   GYNECOLOGIC HISTORY:  No LMP recorded. Patient is postmenopausal.  menarche age 34, the patient is GX P0 and she understands that not carrying a child to term before the age of 73 essentially doubled the risk of breast cancer. She stopped having periods between age 2 and 33 and did not take hormone replacement. 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 She lives by herself, with her pet dog. Her son, adopted at birth, Terri Mayer, currently 20, attends [employer] in the psychology Department.     ADVANCED DIRECTIVES: Not in place. At the initial clinic visit the patient was given the appropriate forms to complete and notarize at her discretion.    HEALTH MAINTENANCE: Social History  Substance Use Topics  . Smoking status: Never Smoker  . Smokeless tobacco: Never Used  . Alcohol use Yes     Comment: rarely       Colonoscopy: 12/28/2004  PAP:  Bone density:  Lipid panel:  Allergies  Allergen Reactions  . Codeine Nausea And Vomiting    Current Outpatient Prescriptions  Medication Sig Dispense Refill  . hyaluronate sodium (RADIAPLEXRX) GEL Apply 1 application topically 2 (two) times daily.    Marland Kitchen ibuprofen (ADVIL,MOTRIN) 200 MG tablet Take 200 mg by mouth every 6 (six) hours as needed for fever or mild pain. Reported on 03/22/2016    . lidocaine-prilocaine (EMLA) cream   3  . LORazepam (ATIVAN) 0.5 MG tablet Take 0.5 mg by mouth daily as needed. Reported on 7/0/3500    . non-metallic deodorant Jethro Poling) MISC Apply 1 application topically daily as needed.    Marland Kitchen omeprazole (PRILOSEC) 40 MG capsule Take 1 capsule (40 mg total) by mouth daily. 90 capsule 4   No current facility-administered medications for this visit.     OBJECTIVE: Middle-aged white woman in no acute distress Wall  Vitals:   09/16/16 0948  BP: (!) 123/55  Pulse: 73  Resp: 18  Temp: 98.1 F (36.7 C)     Body mass index is 33.1 kg/m.    ECOG  FS:1 - Symptomatic but completely ambulatory  Sclerae unicteric, pupils round and equal Oropharynx clear and moist-- no thrush or other lesions No cervical or supraclavicular adenopathy Lungs no rales or rhonchi Heart regular rate and rhythm Abd soft, nontender, positive bowel sounds MSK no focal spinal tenderness, decreased abduction of the right shoulder secondary to pain, but no erythema or swelling Neuro: nonfocal, well oriented, appropriate affect Breasts: The right breast is status post lumpectomy and radiation. There is no evidence of local recurrence. The right axilla is benign. The left breast is unremarkable    LAB RESULTS:  CMP     Component Value Date/Time   NA 143 09/16/2016 0927   K 4.8 09/16/2016 0927   CO2 28 09/16/2016 0927   GLUCOSE 91 09/16/2016 0927   BUN 21.7 09/16/2016 0927   CREATININE 0.9 09/16/2016 0927   CALCIUM 9.5 09/16/2016 0927   PROT 7.0  09/16/2016 0927   ALBUMIN 3.6 09/16/2016 0927   AST 21 09/16/2016 0927   ALT 14 09/16/2016 0927   ALKPHOS 73 09/16/2016 0927   BILITOT 0.91 09/16/2016 0927    INo results found for: SPEP, UPEP  Lab Results  Component Value Date   WBC 5.8 09/16/2016   NEUTROABS 3.9 09/16/2016   HGB 12.9 09/16/2016   HCT 39.7 09/16/2016   MCV 79.3 (L) 09/16/2016   PLT 239 09/16/2016      Chemistry      Component Value Date/Time   NA 143 09/16/2016 0927   K 4.8 09/16/2016 0927   CO2 28 09/16/2016 0927   BUN 21.7 09/16/2016 0927   CREATININE 0.9 09/16/2016 0927      Component Value Date/Time   CALCIUM 9.5 09/16/2016 0927   ALKPHOS 73 09/16/2016 0927   AST 21 09/16/2016 0927   ALT 14 09/16/2016 0927   BILITOT 0.91 09/16/2016 0927       No results found for: LABCA2  No components found for: [id number]  No results for input(s): INR in the last 168 hours.  Urinalysis    Component Value Date/Time   BILIRUBINUR neg 12/29/2015 1014   PROTEINUR pos 12/29/2015 1014   UROBILINOGEN negative 12/29/2015 1014   NITRITE neg 12/29/2015 1014   LEUKOCYTESUR Negative 12/29/2015 1014   STUDIES: ------------------------------------------------------------------- Echocardiography  Patient:    Sya, Nestler MR #:       [id number] Study Date: 06/21/2016 Gender:     F Age:        83 Height:     167.6 cm Weight:     91.8 kg BSA:        2.1 m^2 Pt. Status: Room:   ORDERING     Bensimhon, Mayer  REFERRING    Bensimhon, Sterling Big, Outpatient  SONOGRAPHER  [facility]  ATTENDING    [name]  REFERRING    [name]  cc:  ------------------------------------------------------------------- LV EF: 50% -   55%  -------------------------------------------------------------------   ASSESSMENT: 65 y.o. [place] woman status post right breast lower outer quadrant and right axillary lymph node biopsy 11/16/2015, also positive for a clinical  T1c  pN1, stage IIA invasive ductal carcinoma grade 3, essentially estrogen and progesterone receptor negative but HER-2 amplified, with an MIB-1 of 30%  (a) the breast lesion had moderate positivity for the estrogen receptor at 10%; the axillary lymph node lesion was estrogen receptor negative. Both were progesterone receptor negative  (1) neoadjuvant chemotherapy with carboplatin, docetaxel, trastuzumab and pertuzumab for 6 cycles, started 12/18/2015  (  a) docetaxel switched to gemcitabine for cycles 4-6 because of progressive neuropathy  (b) pertuzumab dose cut in half with cycle 4 due to excessive diarrhea, resumed full dose cycle 5  (2) trastuzumab continued to total one year, through January 2018  (a) baseline echocardiogram on 12/12/15 showed an EF of 60-65%  (b) echocardiogram 04/19/2016 showed an ejection fraction in the 50-55% range  (3) right lumpectomy and sentinel lymph node sampling 05/02/2016 showed a complete pathologic response (ypT0, ypN0)  (4) adjuvant radiation 06/17/3016-07/12/2016 1. Right breast boost// 7.5 Gy with 3 fractions at a dose of 2.5 Gy/ fraction 2. Right breast// 42.5 Gy with 17 fractions at a dose of 2.5 Gy/ fraction  PLAN: Terri Mayer continues to do well with her trastuzumab and she will receive a treatment today and every 21 days with the last treatment scheduled for January 24.  She will have an echo in November, likely her last one.  We discussed her right shoulder. She is going to give it a little bit of rest and I said and then do some range of motion exercises. If he doesn't feel much better in a week or so she may have torn something and she may need orthopedic referral. She will let me know.  We discussed the fact that her M CV has dropped. Usually this indicates arm deficiency. She is already scheduled for colonoscopy later this week under Dr. Oletta Lamas. After the colonoscopy she can start iron supplementation daily.  She knows to call for any other problems that  may develop before her next visit here.   Chauncey Cruel, MD   09/16/2016 10:08 AM

## 2016-09-16 NOTE — Patient Instructions (Signed)
[facility] Discharge Instructions for Patients Receiving Chemotherapy  Today you received the following chemotherapy agents: Herceptin   To help prevent nausea and vomiting after your treatment, we encourage you to take your nausea medication as directed.    If you develop nausea and vomiting that is not controlled by your nausea medication, call the clinic.   BELOW ARE SYMPTOMS THAT SHOULD BE REPORTED IMMEDIATELY:  *FEVER GREATER THAN 100.5 F  *CHILLS WITH OR WITHOUT FEVER  NAUSEA AND VOMITING THAT IS NOT CONTROLLED WITH YOUR NAUSEA MEDICATION  *UNUSUAL SHORTNESS OF BREATH  *UNUSUAL BRUISING OR BLEEDING  TENDERNESS IN MOUTH AND THROAT WITH OR WITHOUT PRESENCE OF ULCERS  *URINARY PROBLEMS  *BOWEL PROBLEMS  UNUSUAL RASH Items with * indicate a potential emergency and should be followed up as soon as possible.  Feel free to call the clinic you have any questions or concerns. The clinic phone number is [phone].  Please show the CHEMO ALERT CARD at check-in to the Emergency Department and triage nurse.   

## 2016-09-17 NOTE — Addendum Note (Signed)
Addended by: Prentiss Bells on: 09/17/2016 07:31 PM   Modules accepted: Orders

## 2016-09-18 DIAGNOSIS — K5730 Diverticulosis of large intestine without perforation or abscess without bleeding: Secondary | ICD-10-CM | POA: Diagnosis not present

## 2016-09-18 DIAGNOSIS — K640 First degree hemorrhoids: Secondary | ICD-10-CM | POA: Diagnosis not present

## 2016-09-18 DIAGNOSIS — Z1211 Encounter for screening for malignant neoplasm of colon: Secondary | ICD-10-CM | POA: Diagnosis not present

## 2016-10-07 ENCOUNTER — Ambulatory Visit: Payer: PPO

## 2016-10-07 ENCOUNTER — Other Ambulatory Visit (HOSPITAL_BASED_OUTPATIENT_CLINIC_OR_DEPARTMENT_OTHER): Payer: PPO

## 2016-10-07 ENCOUNTER — Ambulatory Visit (HOSPITAL_BASED_OUTPATIENT_CLINIC_OR_DEPARTMENT_OTHER): Payer: PPO

## 2016-10-07 DIAGNOSIS — C50211 Malignant neoplasm of upper-inner quadrant of right female breast: Secondary | ICD-10-CM

## 2016-10-07 DIAGNOSIS — Z5112 Encounter for antineoplastic immunotherapy: Secondary | ICD-10-CM | POA: Diagnosis not present

## 2016-10-07 LAB — COMPREHENSIVE METABOLIC PANEL
ALBUMIN: 3.5 g/dL (ref 3.5–5.0)
ALK PHOS: 69 U/L (ref 40–150)
ALT: 13 U/L (ref 0–55)
ANION GAP: 8 meq/L (ref 3–11)
AST: 21 U/L (ref 5–34)
BILIRUBIN TOTAL: 0.94 mg/dL (ref 0.20–1.20)
BUN: 20.5 mg/dL (ref 7.0–26.0)
CALCIUM: 9.8 mg/dL (ref 8.4–10.4)
CO2: 28 mEq/L (ref 22–29)
Chloride: 105 mEq/L (ref 98–109)
Creatinine: 0.9 mg/dL (ref 0.6–1.1)
EGFR: 70 mL/min/{1.73_m2} — AB (ref >90)
GLUCOSE: 82 mg/dL (ref 70–140)
POTASSIUM: 4.4 meq/L (ref 3.5–5.1)
SODIUM: 142 meq/L (ref 136–145)
TOTAL PROTEIN: 6.8 g/dL (ref 6.4–8.3)

## 2016-10-07 LAB — CBC WITH DIFFERENTIAL/PLATELET
BASO%: 0.6 % (ref 0.0–2.0)
BASOS ABS: 0 10*3/uL (ref 0.0–0.1)
EOS ABS: 0.2 10*3/uL (ref 0.0–0.5)
EOS%: 3.2 % (ref 0.0–7.0)
HEMATOCRIT: 38.5 % (ref 34.8–46.6)
HEMOGLOBIN: 12.4 g/dL (ref 11.6–15.9)
LYMPH%: 21.1 % (ref 14.0–49.7)
MCH: 25.4 pg (ref 25.1–34.0)
MCHC: 32.2 g/dL (ref 31.5–36.0)
MCV: 78.8 fL — AB (ref 79.5–101.0)
MONO#: 0.5 10*3/uL (ref 0.1–0.9)
MONO%: 9.6 % (ref 0.0–14.0)
NEUT%: 65.5 % (ref 38.4–76.8)
NEUTROS ABS: 3.8 10*3/uL (ref 1.5–6.5)
PLATELETS: 263 10*3/uL (ref 145–400)
RBC: 4.89 10*6/uL (ref 3.70–5.45)
RDW: 14.5 % (ref 11.2–14.5)
WBC: 5.7 10*3/uL (ref 3.9–10.3)
lymph#: 1.2 10*3/uL (ref 0.9–3.3)

## 2016-10-07 MED ORDER — DIPHENHYDRAMINE HCL 25 MG PO CAPS
ORAL_CAPSULE | ORAL | Status: AC
  Filled 2016-10-07: qty 1

## 2016-10-07 MED ORDER — DIPHENHYDRAMINE HCL 25 MG PO CAPS
25.0000 mg | ORAL_CAPSULE | Freq: Once | ORAL | Status: AC
  Administered 2016-10-07: 25 mg via ORAL

## 2016-10-07 MED ORDER — SODIUM CHLORIDE 0.9 % IV SOLN
Freq: Once | INTRAVENOUS | Status: AC
  Administered 2016-10-07: 11:00:00 via INTRAVENOUS

## 2016-10-07 MED ORDER — SODIUM CHLORIDE 0.9% FLUSH
10.0000 mL | INTRAVENOUS | Status: DC | PRN
  Administered 2016-10-07: 10 mL
  Filled 2016-10-07: qty 10

## 2016-10-07 MED ORDER — HEPARIN SOD (PORK) LOCK FLUSH 100 UNIT/ML IV SOLN
500.0000 [IU] | Freq: Once | INTRAVENOUS | Status: AC | PRN
  Administered 2016-10-07: 500 [IU]
  Filled 2016-10-07: qty 5

## 2016-10-07 MED ORDER — ACETAMINOPHEN 325 MG PO TABS
ORAL_TABLET | ORAL | Status: AC
  Filled 2016-10-07: qty 2

## 2016-10-07 MED ORDER — ACETAMINOPHEN 325 MG PO TABS
650.0000 mg | ORAL_TABLET | Freq: Once | ORAL | Status: AC
  Administered 2016-10-07: 650 mg via ORAL

## 2016-10-07 MED ORDER — TRASTUZUMAB CHEMO 150 MG IV SOLR
6.0000 mg/kg | Freq: Once | INTRAVENOUS | Status: AC
  Administered 2016-10-07: 567 mg via INTRAVENOUS
  Filled 2016-10-07: qty 27

## 2016-10-07 NOTE — Patient Instructions (Signed)
[facility] Discharge Instructions for Patients Receiving Chemotherapy  Today you received the following chemotherapy agents Herceptin  To help prevent nausea and vomiting after your treatment, we encourage you to take your nausea medication    If you develop nausea and vomiting that is not controlled by your nausea medication, call the clinic.   BELOW ARE SYMPTOMS THAT SHOULD BE REPORTED IMMEDIATELY:  *FEVER GREATER THAN 100.5 F  *CHILLS WITH OR WITHOUT FEVER  NAUSEA AND VOMITING THAT IS NOT CONTROLLED WITH YOUR NAUSEA MEDICATION  *UNUSUAL SHORTNESS OF BREATH  *UNUSUAL BRUISING OR BLEEDING  TENDERNESS IN MOUTH AND THROAT WITH OR WITHOUT PRESENCE OF ULCERS  *URINARY PROBLEMS  *BOWEL PROBLEMS  UNUSUAL RASH Items with * indicate a potential emergency and should be followed up as soon as possible.  Feel free to call the clinic you have any questions or concerns. The clinic phone number is [phone].  Please show the CHEMO ALERT CARD at check-in to the Emergency Department and triage nurse.   

## 2016-10-18 ENCOUNTER — Encounter (HOSPITAL_COMMUNITY): Payer: PPO | Admitting: Internal Medicine

## 2016-10-18 ENCOUNTER — Other Ambulatory Visit (HOSPITAL_COMMUNITY): Payer: PPO

## 2016-10-18 LAB — HM COLONOSCOPY

## 2016-10-22 ENCOUNTER — Other Ambulatory Visit (HOSPITAL_COMMUNITY): Payer: Self-pay | Admitting: *Deleted

## 2016-10-22 ENCOUNTER — Ambulatory Visit (HOSPITAL_BASED_OUTPATIENT_CLINIC_OR_DEPARTMENT_OTHER)
Admission: RE | Admit: 2016-10-22 | Discharge: 2016-10-22 | Disposition: A | Discharge status: Home / Self Care | Payer: PPO | Source: Ambulatory Visit | Attending: Internal Medicine | Admitting: Internal Medicine

## 2016-10-22 ENCOUNTER — Ambulatory Visit (HOSPITAL_COMMUNITY)
Admission: RE | Admit: 2016-10-22 | Discharge: 2016-10-22 | Disposition: A | Discharge status: Home / Self Care | Payer: PPO | Source: Ambulatory Visit | Attending: Internal Medicine | Admitting: Internal Medicine

## 2016-10-22 VITALS — BP 116/74 | HR 77 | Wt 205.5 lb

## 2016-10-22 DIAGNOSIS — C50411 Malignant neoplasm of upper-outer quadrant of right female breast: Secondary | ICD-10-CM

## 2016-10-22 DIAGNOSIS — Z6833 Body mass index (BMI) 33.0-33.9, adult: Secondary | ICD-10-CM | POA: Insufficient documentation

## 2016-10-22 DIAGNOSIS — C50211 Malignant neoplasm of upper-inner quadrant of right female breast: Secondary | ICD-10-CM

## 2016-10-22 DIAGNOSIS — I071 Rheumatic tricuspid insufficiency: Secondary | ICD-10-CM | POA: Insufficient documentation

## 2016-10-22 DIAGNOSIS — Z171 Estrogen receptor negative status [ER-]: Secondary | ICD-10-CM | POA: Diagnosis not present

## 2016-10-22 DIAGNOSIS — E669 Obesity, unspecified: Secondary | ICD-10-CM | POA: Diagnosis not present

## 2016-10-22 NOTE — Patient Instructions (Addendum)
Follow up in 3 Months with Echo

## 2016-10-22 NOTE — Progress Notes (Signed)
  Echocardiogram 2D Echocardiogram has been performed.  Tresa Res 10/22/2016, 1:48 PM

## 2016-10-28 ENCOUNTER — Ambulatory Visit: Payer: PPO

## 2016-10-28 ENCOUNTER — Other Ambulatory Visit (HOSPITAL_BASED_OUTPATIENT_CLINIC_OR_DEPARTMENT_OTHER): Payer: PPO

## 2016-10-28 ENCOUNTER — Ambulatory Visit (HOSPITAL_BASED_OUTPATIENT_CLINIC_OR_DEPARTMENT_OTHER): Payer: PPO

## 2016-10-28 VITALS — BP 145/75 | HR 79 | Temp 98.9°F | Resp 18

## 2016-10-28 DIAGNOSIS — Z5112 Encounter for antineoplastic immunotherapy: Secondary | ICD-10-CM

## 2016-10-28 DIAGNOSIS — C50211 Malignant neoplasm of upper-inner quadrant of right female breast: Secondary | ICD-10-CM | POA: Diagnosis not present

## 2016-10-28 LAB — COMPREHENSIVE METABOLIC PANEL
ALBUMIN: 3.4 g/dL — AB (ref 3.5–5.0)
ALT: 18 U/L (ref 0–55)
ANION GAP: 7 meq/L (ref 3–11)
AST: 21 U/L (ref 5–34)
Alkaline Phosphatase: 78 U/L (ref 40–150)
BILIRUBIN TOTAL: 0.67 mg/dL (ref 0.20–1.20)
BUN: 28.9 mg/dL — ABNORMAL HIGH (ref 7.0–26.0)
CO2: 28 meq/L (ref 22–29)
CREATININE: 0.8 mg/dL (ref 0.6–1.1)
Calcium: 9.4 mg/dL (ref 8.4–10.4)
Chloride: 107 mEq/L (ref 98–109)
EGFR: 76 mL/min/{1.73_m2} — ABNORMAL LOW (ref >90)
GLUCOSE: 67 mg/dL — AB (ref 70–140)
Potassium: 4.4 mEq/L (ref 3.5–5.1)
SODIUM: 142 meq/L (ref 136–145)
TOTAL PROTEIN: 6.5 g/dL (ref 6.4–8.3)

## 2016-10-28 LAB — CBC WITH DIFFERENTIAL/PLATELET
BASO%: 0.4 % (ref 0.0–2.0)
Basophils Absolute: 0 10*3/uL (ref 0.0–0.1)
EOS%: 3.5 % (ref 0.0–7.0)
Eosinophils Absolute: 0.2 10*3/uL (ref 0.0–0.5)
HCT: 38.8 % (ref 34.8–46.6)
HEMOGLOBIN: 13 g/dL (ref 11.6–15.9)
LYMPH%: 20.8 % (ref 14.0–49.7)
MCH: 26.7 pg (ref 25.1–34.0)
MCHC: 33.5 g/dL (ref 31.5–36.0)
MCV: 79.8 fL (ref 79.5–101.0)
MONO#: 0.6 10*3/uL (ref 0.1–0.9)
MONO%: 10.3 % (ref 0.0–14.0)
NEUT%: 65 % (ref 38.4–76.8)
NEUTROS ABS: 3.8 10*3/uL (ref 1.5–6.5)
PLATELETS: 254 10*3/uL (ref 145–400)
RBC: 4.87 10*6/uL (ref 3.70–5.45)
RDW: 15.6 % — AB (ref 11.2–14.5)
WBC: 5.8 10*3/uL (ref 3.9–10.3)
lymph#: 1.2 10*3/uL (ref 0.9–3.3)

## 2016-10-28 MED ORDER — SODIUM CHLORIDE 0.9% FLUSH
10.0000 mL | INTRAVENOUS | Status: DC | PRN
  Administered 2016-10-28: 10 mL
  Filled 2016-10-28: qty 10

## 2016-10-28 MED ORDER — SODIUM CHLORIDE 0.9 % IV SOLN: Freq: Once | INTRAVENOUS | Status: DC

## 2016-10-28 MED ORDER — ACETAMINOPHEN 325 MG PO TABS
ORAL_TABLET | ORAL | Status: AC
  Filled 2016-10-28: qty 2

## 2016-10-28 MED ORDER — ACETAMINOPHEN 325 MG PO TABS
650.0000 mg | ORAL_TABLET | Freq: Once | ORAL | Status: AC
  Administered 2016-10-28: 650 mg via ORAL

## 2016-10-28 MED ORDER — DIPHENHYDRAMINE HCL 25 MG PO CAPS
25.0000 mg | ORAL_CAPSULE | Freq: Once | ORAL | Status: AC
  Administered 2016-10-28: 25 mg via ORAL

## 2016-10-28 MED ORDER — HEPARIN SOD (PORK) LOCK FLUSH 100 UNIT/ML IV SOLN
500.0000 [IU] | Freq: Once | INTRAVENOUS | Status: AC | PRN
  Administered 2016-10-28: 500 [IU]
  Filled 2016-10-28: qty 5

## 2016-10-28 MED ORDER — SODIUM CHLORIDE 0.9 % IV SOLN
6.0000 mg/kg | Freq: Once | INTRAVENOUS | Status: AC
  Administered 2016-10-28: 567 mg via INTRAVENOUS
  Filled 2016-10-28: qty 27

## 2016-10-28 MED ORDER — DIPHENHYDRAMINE HCL 25 MG PO CAPS
ORAL_CAPSULE | ORAL | Status: AC
  Filled 2016-10-28: qty 1

## 2016-10-28 NOTE — Patient Instructions (Signed)
[facility] Discharge Instructions for Patients Receiving Chemotherapy  Today you received the following chemotherapy agents Herceptin  To help prevent nausea and vomiting after your treatment, we encourage you to take your nausea medication    If you develop nausea and vomiting that is not controlled by your nausea medication, call the clinic.   BELOW ARE SYMPTOMS THAT SHOULD BE REPORTED IMMEDIATELY:  *FEVER GREATER THAN 100.5 F  *CHILLS WITH OR WITHOUT FEVER  NAUSEA AND VOMITING THAT IS NOT CONTROLLED WITH YOUR NAUSEA MEDICATION  *UNUSUAL SHORTNESS OF BREATH  *UNUSUAL BRUISING OR BLEEDING  TENDERNESS IN MOUTH AND THROAT WITH OR WITHOUT PRESENCE OF ULCERS  *URINARY PROBLEMS  *BOWEL PROBLEMS  UNUSUAL RASH Items with * indicate a potential emergency and should be followed up as soon as possible.  Feel free to call the clinic you have any questions or concerns. The clinic phone number is [phone].  Please show the CHEMO ALERT CARD at check-in to the Emergency Department and triage nurse.   

## 2016-11-01 NOTE — Telephone Encounter (Signed)
NO ENTRY 

## 2016-11-02 NOTE — Progress Notes (Signed)
Patient ID: MEILING HENDRIKS, female   DOB: September 13, 1951, 65 y.o.   MRN: [id number]    Advanced Heart Failure/Cardio-oncology Note   Referring Physician: Dr Jana Hakim Primary Care: Dr. Bernerd Limbo Primary Cardiologist: New (Dr. 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 ER/PR- and HER-2-neu positive. Referred by Dr. Jana Hakim for enrollment into cardio-oncology clinic.   Breast CA profile: Biopsy of the right breast 8:30 o'clock lesion and the right axillary lymph node on 11/16/2015 showed ([id number]) both to be positive for invasive ductal carcinoma, grade 3, the breast mass being estrogen receptor 10% positive with moderate staining intensity, but progesterone receptor negative, but the lymph node being estrogen and progesterone both negative. Both the breast and lymph nodes had MIB-1 of 30%. Both were positive for HER-2 amplification, the signals ratio is being 6.06 and 7.61, and the number per cell 10.00 and 11.80.    Had lumpectomy in June 2017. All negative for CA.  She presents today for cardio-oncology follow up. Remains on Herceptin. Tolerating well. Previously stopped carvedilol due to fatigue and low BP (90s) with presyncope. No cardiac complaints. Palpitations improved. No HF.    Echo 12/12/15 EF 65%, LS' 10.1   GLS -21.9%, Grade 2 DD Echo 02/28/16 EF 55%, LS' 9.2     GLS -15.9%, Grade 1 DD, Mildly calcified AV Echo 04/19/16 EF 50-55%, LS' 9.1  GLS - 16.9%,  Echo 06/21/16 EF ~55% LS' 10.0   GLS -16.4% Echo 10/22/16  EF 55-60% LS' 10.2 GLS -22.6%   Past Medical History:  Diagnosis Date  . Allergy   . Breast cancer ([place])   . History of chicken pox   . Osteoarthritis    Neck,Back,Knees,Hips,Ankles    Current Outpatient Prescriptions  Medication Sig Dispense Refill  . BIOTIN PO Take by mouth.    . Cholecalciferol (VITAMIN D3 PO) Take by  mouth.    . Cyanocobalamin (VITAMIN B12 PO) Take by mouth.    Marland Kitchen FLUZONE HIGH-DOSE 0.5 ML SUSY     . ibuprofen (ADVIL,MOTRIN) 200 MG tablet Take 200 mg by mouth every 6 (six) hours as needed for fever or mild pain. Reported on 03/22/2016    . lidocaine-prilocaine (EMLA) cream   3  . Multiple Vitamin (MULTIVITAMIN) tablet Take 1 tablet by mouth daily.    . non-metallic deodorant Jethro Poling) MISC Apply 1 application topically daily as needed.    Marland Kitchen omeprazole (PRILOSEC) 40 MG capsule Take 1 capsule (40 mg total) by mouth daily. 90 capsule 4  . Pyridoxine HCl (VITAMIN B6 PO) Take by mouth.     No current facility-administered medications for this encounter.     Allergies  Allergen Reactions  . Codeine Nausea And Vomiting      Social History   Social History  . Marital status: Single    Spouse name: N/A  . Number of children: 1  . Years of education: N/A   Occupational History  . dentist    Social History Main Topics  . Smoking status: Never Smoker  . Smokeless tobacco: Never Used  . Alcohol use Yes     Comment: rarely   . Drug use: Unknown  . Sexual activity: Not on file   Other Topics Concern  . Not on file   Social History Narrative   Single, adopted 1 chil      Family History  Problem Relation Age  of Onset  . Heart disease Father   . Diabetes      GM  . Colon cancer Neg Hx   . Breast cancer Neg Hx   . Dementia      M  . Transient ischemic attack      F    Vitals:   10/22/16 1414  BP: 116/74  Pulse: 77  SpO2: 95%  Weight: 205 lb 8 oz (93.2 kg)    PHYSICAL EXAM: General:  WDWN. NAD.  HEENT: normal Neck: supple. JVD not elevated. + port-a-cath. Carotids 2+ bilat; no bruits. No thyromegaly or nodule noted appreciated. Cor: PMI nondisplaced. RRR, No M/G/R appreciated. Port R chest.  Lungs: CTAB, normal effort Abdomen: Obese, soft, NT. +BS  Extremities: no cyanosis, clubbing, rash. Trace ankle edema.  Neuro: alert & oriented x 3, cranial nerves grossly  intact. moves all 4 extremities w/o difficulty. Affect pleasant.   ASSESSMENT & PLAN:   1. Bbreast cancer of the right upper-inner quadrant - Has completed 6 of 6 cycles of Carboplatin + Docetaxel + trastuzumab and pertuzamab over same time period. Finishes Herceptin in January 2018  --I reviewed echos personally. EF and Doppler parameters stable. No HF on exam. Continue Herceptin. As she will finish in January, no need for further echos unless becomes symptomatic. 2. Peripheral edema - Improved - Continue 20 mg lasix as needed for worsening edema.  3. Tachycardia/palpitations - Improved. Can consider AliveCor monitor as needed  Lamoine Magallon,MD 10:26 PM

## 2016-11-20 ENCOUNTER — Ambulatory Visit (HOSPITAL_BASED_OUTPATIENT_CLINIC_OR_DEPARTMENT_OTHER): Payer: PPO

## 2016-11-20 ENCOUNTER — Other Ambulatory Visit (HOSPITAL_BASED_OUTPATIENT_CLINIC_OR_DEPARTMENT_OTHER): Payer: PPO

## 2016-11-20 VITALS — BP 128/65 | HR 73 | Temp 98.8°F | Resp 17

## 2016-11-20 DIAGNOSIS — C50211 Malignant neoplasm of upper-inner quadrant of right female breast: Secondary | ICD-10-CM

## 2016-11-20 DIAGNOSIS — Z5112 Encounter for antineoplastic immunotherapy: Secondary | ICD-10-CM | POA: Diagnosis not present

## 2016-11-20 LAB — CBC WITH DIFFERENTIAL/PLATELET
BASO%: 0.5 % (ref 0.0–2.0)
Basophils Absolute: 0 10*3/uL (ref 0.0–0.1)
EOS%: 2.7 % (ref 0.0–7.0)
Eosinophils Absolute: 0.2 10*3/uL (ref 0.0–0.5)
HEMATOCRIT: 38.6 % (ref 34.8–46.6)
HEMOGLOBIN: 12.9 g/dL (ref 11.6–15.9)
LYMPH#: 1.3 10*3/uL (ref 0.9–3.3)
LYMPH%: 23.6 % (ref 14.0–49.7)
MCH: 27 pg (ref 25.1–34.0)
MCHC: 33.4 g/dL (ref 31.5–36.0)
MCV: 80.9 fL (ref 79.5–101.0)
MONO#: 0.5 10*3/uL (ref 0.1–0.9)
MONO%: 8 % (ref 0.0–14.0)
NEUT%: 65.2 % (ref 38.4–76.8)
NEUTROS ABS: 3.7 10*3/uL (ref 1.5–6.5)
PLATELETS: 228 10*3/uL (ref 145–400)
RBC: 4.77 10*6/uL (ref 3.70–5.45)
RDW: 15 % — ABNORMAL HIGH (ref 11.2–14.5)
WBC: 5.6 10*3/uL (ref 3.9–10.3)

## 2016-11-20 LAB — COMPREHENSIVE METABOLIC PANEL
ALT: 14 U/L (ref 0–55)
ANION GAP: 9 meq/L (ref 3–11)
AST: 23 U/L (ref 5–34)
Albumin: 4 g/dL (ref 3.5–5.0)
Alkaline Phosphatase: 72 U/L (ref 40–150)
BILIRUBIN TOTAL: 1.31 mg/dL — AB (ref 0.20–1.20)
BUN: 20.9 mg/dL (ref 7.0–26.0)
CALCIUM: 9.9 mg/dL (ref 8.4–10.4)
CHLORIDE: 104 meq/L (ref 98–109)
CO2: 29 meq/L (ref 22–29)
CREATININE: 0.9 mg/dL (ref 0.6–1.1)
EGFR: 66 mL/min/{1.73_m2} — ABNORMAL LOW (ref >90)
Glucose: 91 mg/dl (ref 70–140)
Potassium: 4.3 mEq/L (ref 3.5–5.1)
Sodium: 142 mEq/L (ref 136–145)
TOTAL PROTEIN: 7 g/dL (ref 6.4–8.3)

## 2016-11-20 MED ORDER — SODIUM CHLORIDE 0.9% FLUSH
10.0000 mL | INTRAVENOUS | Status: DC | PRN
  Administered 2016-11-20: 10 mL
  Filled 2016-11-20: qty 10

## 2016-11-20 MED ORDER — DIPHENHYDRAMINE HCL 25 MG PO CAPS
ORAL_CAPSULE | ORAL | Status: AC
  Filled 2016-11-20: qty 1

## 2016-11-20 MED ORDER — SODIUM CHLORIDE 0.9 % IV SOLN
Freq: Once | INTRAVENOUS | Status: AC
  Administered 2016-11-20: 12:00:00 via INTRAVENOUS

## 2016-11-20 MED ORDER — DIPHENHYDRAMINE HCL 25 MG PO CAPS
25.0000 mg | ORAL_CAPSULE | Freq: Once | ORAL | Status: AC
  Administered 2016-11-20: 25 mg via ORAL

## 2016-11-20 MED ORDER — ACETAMINOPHEN 325 MG PO TABS
650.0000 mg | ORAL_TABLET | Freq: Once | ORAL | Status: AC
  Administered 2016-11-20: 650 mg via ORAL

## 2016-11-20 MED ORDER — ACETAMINOPHEN 325 MG PO TABS
ORAL_TABLET | ORAL | Status: AC
  Filled 2016-11-20: qty 2

## 2016-11-20 MED ORDER — HEPARIN SOD (PORK) LOCK FLUSH 100 UNIT/ML IV SOLN
500.0000 [IU] | Freq: Once | INTRAVENOUS | Status: AC | PRN
  Administered 2016-11-20: 500 [IU]
  Filled 2016-11-20: qty 5

## 2016-11-20 MED ORDER — SODIUM CHLORIDE 0.9 % IV SOLN
6.0000 mg/kg | Freq: Once | INTRAVENOUS | Status: AC
  Administered 2016-11-20: 567 mg via INTRAVENOUS
  Filled 2016-11-20: qty 27

## 2016-11-20 NOTE — Patient Instructions (Signed)
[facility] Discharge Instructions for Patients Receiving Chemotherapy  Today you received the following chemotherapy agents:  Herceptin  To help prevent nausea and vomiting after your treatment, we encourage you to take your nausea medication as prescribed.   If you develop nausea and vomiting that is not controlled by your nausea medication, call the clinic.   BELOW ARE SYMPTOMS THAT SHOULD BE REPORTED IMMEDIATELY:  *FEVER GREATER THAN 100.5 F  *CHILLS WITH OR WITHOUT FEVER  NAUSEA AND VOMITING THAT IS NOT CONTROLLED WITH YOUR NAUSEA MEDICATION  *UNUSUAL SHORTNESS OF BREATH  *UNUSUAL BRUISING OR BLEEDING  TENDERNESS IN MOUTH AND THROAT WITH OR WITHOUT PRESENCE OF ULCERS  *URINARY PROBLEMS  *BOWEL PROBLEMS  UNUSUAL RASH Items with * indicate a potential emergency and should be followed up as soon as possible.  Feel free to call the clinic you have any questions or concerns. The clinic phone number is [phone].  Please show the CHEMO ALERT CARD at check-in to the Emergency Department and triage nurse.   

## 2016-12-03 DIAGNOSIS — C50919 Malignant neoplasm of unspecified site of unspecified female breast: Secondary | ICD-10-CM | POA: Insufficient documentation

## 2016-12-10 ENCOUNTER — Other Ambulatory Visit: Payer: Self-pay | Admitting: *Deleted

## 2016-12-10 DIAGNOSIS — C50919 Malignant neoplasm of unspecified site of unspecified female breast: Secondary | ICD-10-CM

## 2016-12-11 ENCOUNTER — Ambulatory Visit (HOSPITAL_BASED_OUTPATIENT_CLINIC_OR_DEPARTMENT_OTHER): Payer: PPO

## 2016-12-11 ENCOUNTER — Other Ambulatory Visit (HOSPITAL_BASED_OUTPATIENT_CLINIC_OR_DEPARTMENT_OTHER): Payer: PPO

## 2016-12-11 ENCOUNTER — Ambulatory Visit (HOSPITAL_BASED_OUTPATIENT_CLINIC_OR_DEPARTMENT_OTHER): Payer: PPO | Admitting: Oncology

## 2016-12-11 VITALS — BP 118/54 | HR 77 | Temp 97.9°F | Resp 18 | Ht 66.0 in | Wt 206.3 lb

## 2016-12-11 DIAGNOSIS — C50211 Malignant neoplasm of upper-inner quadrant of right female breast: Secondary | ICD-10-CM

## 2016-12-11 DIAGNOSIS — Z171 Estrogen receptor negative status [ER-]: Secondary | ICD-10-CM | POA: Diagnosis not present

## 2016-12-11 DIAGNOSIS — C773 Secondary and unspecified malignant neoplasm of axilla and upper limb lymph nodes: Secondary | ICD-10-CM

## 2016-12-11 DIAGNOSIS — Z5112 Encounter for antineoplastic immunotherapy: Secondary | ICD-10-CM | POA: Diagnosis not present

## 2016-12-11 DIAGNOSIS — G620 Drug-induced polyneuropathy: Secondary | ICD-10-CM | POA: Diagnosis not present

## 2016-12-11 DIAGNOSIS — C50919 Malignant neoplasm of unspecified site of unspecified female breast: Secondary | ICD-10-CM

## 2016-12-11 DIAGNOSIS — C50411 Malignant neoplasm of upper-outer quadrant of right female breast: Secondary | ICD-10-CM

## 2016-12-11 LAB — CBC WITH DIFFERENTIAL/PLATELET
BASO%: 0.2 % (ref 0.0–2.0)
Basophils Absolute: 0 10*3/uL (ref 0.0–0.1)
EOS ABS: 0.2 10*3/uL (ref 0.0–0.5)
EOS%: 4.1 % (ref 0.0–7.0)
HCT: 38.8 % (ref 34.8–46.6)
HEMOGLOBIN: 12.7 g/dL (ref 11.6–15.9)
LYMPH#: 1.3 10*3/uL (ref 0.9–3.3)
LYMPH%: 27.8 % (ref 14.0–49.7)
MCH: 26.7 pg (ref 25.1–34.0)
MCHC: 32.7 g/dL (ref 31.5–36.0)
MCV: 81.7 fL (ref 79.5–101.0)
MONO#: 0.5 10*3/uL (ref 0.1–0.9)
MONO%: 9.5 % (ref 0.0–14.0)
NEUT%: 58.4 % (ref 38.4–76.8)
NEUTROS ABS: 2.8 10*3/uL (ref 1.5–6.5)
PLATELETS: 191 10*3/uL (ref 145–400)
RBC: 4.75 10*6/uL (ref 3.70–5.45)
RDW: 14.3 % (ref 11.2–14.5)
WBC: 4.8 10*3/uL (ref 3.9–10.3)

## 2016-12-11 LAB — COMPREHENSIVE METABOLIC PANEL
ALBUMIN: 3.7 g/dL (ref 3.5–5.0)
ALK PHOS: 60 U/L (ref 40–150)
ALT: 14 U/L (ref 0–55)
AST: 22 U/L (ref 5–34)
Anion Gap: 8 mEq/L (ref 3–11)
BILIRUBIN TOTAL: 1.01 mg/dL (ref 0.20–1.20)
BUN: 26.3 mg/dL — ABNORMAL HIGH (ref 7.0–26.0)
CO2: 29 mEq/L (ref 22–29)
CREATININE: 0.9 mg/dL (ref 0.6–1.1)
Calcium: 9.8 mg/dL (ref 8.4–10.4)
Chloride: 106 mEq/L (ref 98–109)
EGFR: 65 mL/min/{1.73_m2} — AB (ref >90)
GLUCOSE: 96 mg/dL (ref 70–140)
Potassium: 4.3 mEq/L (ref 3.5–5.1)
Sodium: 144 mEq/L (ref 136–145)
TOTAL PROTEIN: 6.8 g/dL (ref 6.4–8.3)

## 2016-12-11 MED ORDER — ACETAMINOPHEN 325 MG PO TABS
650.0000 mg | ORAL_TABLET | Freq: Once | ORAL | Status: AC
  Administered 2016-12-11: 650 mg via ORAL

## 2016-12-11 MED ORDER — SODIUM CHLORIDE 0.9% FLUSH
10.0000 mL | INTRAVENOUS | Status: DC | PRN
  Administered 2016-12-11: 10 mL
  Filled 2016-12-11: qty 10

## 2016-12-11 MED ORDER — HEPARIN SOD (PORK) LOCK FLUSH 100 UNIT/ML IV SOLN
500.0000 [IU] | Freq: Once | INTRAVENOUS | Status: AC | PRN
  Administered 2016-12-11: 500 [IU]
  Filled 2016-12-11: qty 5

## 2016-12-11 MED ORDER — DIPHENHYDRAMINE HCL 25 MG PO CAPS
ORAL_CAPSULE | ORAL | Status: AC
  Filled 2016-12-11: qty 1

## 2016-12-11 MED ORDER — DIPHENHYDRAMINE HCL 25 MG PO CAPS
25.0000 mg | ORAL_CAPSULE | Freq: Once | ORAL | Status: AC
  Administered 2016-12-11: 25 mg via ORAL

## 2016-12-11 MED ORDER — SODIUM CHLORIDE 0.9 % IV SOLN
Freq: Once | INTRAVENOUS | Status: AC
  Administered 2016-12-11: 12:00:00 via INTRAVENOUS

## 2016-12-11 MED ORDER — TRASTUZUMAB CHEMO 150 MG IV SOLR
6.0000 mg/kg | Freq: Once | INTRAVENOUS | Status: AC
  Administered 2016-12-11: 567 mg via INTRAVENOUS
  Filled 2016-12-11: qty 27

## 2016-12-11 MED ORDER — ACETAMINOPHEN 325 MG PO TABS
ORAL_TABLET | ORAL | Status: AC
  Filled 2016-12-11: qty 2

## 2016-12-11 NOTE — Patient Instructions (Signed)
[facility] Discharge Instructions for Patients Receiving Chemotherapy  Today you received the following chemotherapy agents:  Herceptin  To help prevent nausea and vomiting after your treatment, we encourage you to take your nausea medication as prescribed.   If you develop nausea and vomiting that is not controlled by your nausea medication, call the clinic.   BELOW ARE SYMPTOMS THAT SHOULD BE REPORTED IMMEDIATELY:  *FEVER GREATER THAN 100.5 F  *CHILLS WITH OR WITHOUT FEVER  NAUSEA AND VOMITING THAT IS NOT CONTROLLED WITH YOUR NAUSEA MEDICATION  *UNUSUAL SHORTNESS OF BREATH  *UNUSUAL BRUISING OR BLEEDING  TENDERNESS IN MOUTH AND THROAT WITH OR WITHOUT PRESENCE OF ULCERS  *URINARY PROBLEMS  *BOWEL PROBLEMS  UNUSUAL RASH Items with * indicate a potential emergency and should be followed up as soon as possible.  Feel free to call the clinic you have any questions or concerns. The clinic phone number is [phone].  Please show the CHEMO ALERT CARD at check-in to the Emergency Department and triage nurse.   

## 2016-12-11 NOTE — Progress Notes (Signed)
[facility]  [phone] [phone]   ID: Terri Mayer DOB: 08/30/1951  MR#: [id number]  QJJ#:[id number]  Patient Care Team: [name], MD as PCP - General (Internal Medicine) Chauncey Cruel, MD as Consulting Physician (Oncology) Rolm Bookbinder, MD as Consulting Physician (General Surgery) Princess Bruins, MD as Consulting Physician (Obstetrics and Gynecology) Jolaine Artist, MD as Consulting Physician (Cardiology) Laurence Spates, MD as Consulting Physician (Gastroenterology) PCP: [name], MD GYN: SU:  OTHER MD:  CHIEF COMPLAINT: right breast cancer  CURRENT TREATMENT: Observation  BREAST CANCER HISTORY: From the original intake note:  "Terri Mayer" underwent bilateral screening mammography with tomosynthesis at the breast center 11/02/2015 showing a possible mass in the right breast and low right axillary area. She was called back for right diagnostic mammography with tomosynthesis and right ultrasonography 11/14/2015. The breast density was category B. There was a lobulated mass in the lateral right breast, with a smaller oval mass immediately adjacent to it. Also in the upper outer quadrant there was a third mass. This has been stable back to 2014 and is felt to be a fibroadenoma.  Ultrasound of the right breast confirmed a hypoechoic mass at the 3:00 position 6 cm from the nipple, measuring 1.4 cm. Adjacent to this there was a 0.5 cm internal mammary lymph node, with normal morphology and normal hilar fat. The mass at the 10:00 position measured 0.8 cm and again was consistent with a fibroadenoma. In the right axilla there was an abnormal lymph node with no internal fat measuring 1.8 cm. The rest of the exam was unremarkable.  Biopsy of the right breast 8:30 o'clock lesion and the right axillary lymph node on 11/16/2015 showed ([id number]) both to be positive for invasive ductal carcinoma, grade 3, the breast mass  being estrogen receptor 10% positive with moderate staining intensity, but progesterone receptor negative, but the lymph node being estrogen and progesterone both negative. Both the breast and lymph nodes had MIB-1 of 30%. Both were positive for HER-2 amplification, the signals ratio is being 6.06 and 7.61, and the number per cell 10.00 and 11.80.  The patient's subsequent history is as detailed below   INTERVAL HISTORY: Terri Mayer returns today for follow-up of her HER-2/neu positive breast cancer. Today will be her final dose of trastuzumab, completing a year.  She has done remarkably well with these treatments. Her most recent echocardiogram from December 2017 shows a well-preserved ejection fraction.  REVIEW OF SYSTEMS: Terri Mayer has had some changes in the right breast secondary to radiation. She was very pleased with all the results until after the radiation. She thought it was the tattoo but it really is the treatment itself. It has made the breast course or and the nipple much less sensitive. There is an area of induration inferior and laterally in the breast which worries or and was not there before she says. Aside from that she still has some neuropathy in her feet. This caused her to fall about 2 months ago and she injured her right shoulder, which still hurts. Recall in addition she never actually went to physical therapy after surgery because she didn't feel up to it. She also didn't make it to the [place] program although she has signed up for it. A detailed review of systems today was otherwise stable.  PAST MEDICAL HISTORY: Past Medical History:  Diagnosis Date  . Allergy   . Breast cancer ([place])   . History of chicken pox   . Osteoarthritis  Neck,Back,Knees,Hips,Ankles    PAST SURGICAL HISTORY: Past Surgical History:  Procedure Laterality Date  . DILATION AND CURETTAGE OF UTERUS    . 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 PORTACATH PLACEMENT Right 12/11/2015    Procedure: INSERTION PORT-A-CATH WITH ULTRASOUND;  Surgeon: Rolm Bookbinder, MD;  Location: [facility];  Service: General;  Laterality: Right;  . RADIOACTIVE SEED GUIDED PARTIAL MASTECTOMY/AXILLARY SENTINEL NODE BIOPSY/AXILLARY NODE DISSECTION Right 05/02/2016   Procedure: RIGHT BREAST SEED GUIDED LUMPECTOMY AND RIGHT SEED TARGETED AXILLARY DISSECTION,AND SENTINEL NODE BIOPSY;  Surgeon: Rolm Bookbinder, MD;  Location: [facility];  Service: General;  Laterality: Right;  RIGHT BREAST SEED GUIDED LUMPECTOMY AND RIGHT SEED TARGETED AXILLARY DISSECTION,AND SENTINEL NODE BIOPSY  . WISDOM TOOTH EXTRACTION      FAMILY HISTORY Family History  Problem Relation Age of Onset  . Heart disease Father   . Diabetes      GM  . Colon cancer Neg Hx   . Breast cancer Neg Hx   . Dementia      M  . Transient ischemic attack      F   the patient's father died after multiple strokes at the age of 42. The patient's mother died at the age of 19 with Alzheimer's disease. The patient had one brother, one sister. The patient's paternal grandfather died from lung cancer in his 49s. On the mother's side there are 2 (second) cousins with breast cancer diagnosed in their early 69s, and a more remote cousin diagnosed with breast cancer at age 11. There is no history of ovarian cancer.   GYNECOLOGIC HISTORY:  No LMP recorded. Patient is postmenopausal.  menarche age 55, the patient is GX P0 and she understands that not carrying a child to term before the age of 2 essentially doubled the risk of breast cancer. She stopped having periods between age 8 and 89 and did not take hormone replacement. 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 She lives by herself, with her pet dog. Her son, adopted at birth, Terri Mayer, currently 20, attends [employer] in the  psychology Department.     ADVANCED DIRECTIVES: Not in place. At the initial clinic visit the patient was given the appropriate forms to complete and notarize at her discretion.    HEALTH MAINTENANCE: Social History  Substance Use Topics  . Smoking status: Never Smoker  . Smokeless tobacco: Never Used  . Alcohol use Yes     Comment: rarely      Colonoscopy: 12/28/2004  PAP:  Bone density:  Lipid panel:  Allergies  Allergen Reactions  . Codeine Nausea And Vomiting    Current Outpatient Prescriptions  Medication Sig Dispense Refill  . BIOTIN PO Take by mouth.    . Cholecalciferol (VITAMIN D3 PO) Take by mouth.    . Cyanocobalamin (VITAMIN B12 PO) Take by mouth.    Marland Kitchen FLUZONE HIGH-DOSE 0.5 ML SUSY     . ibuprofen (ADVIL,MOTRIN) 200 MG tablet Take 200 mg by mouth every 6 (six) hours as needed for fever or mild pain. Reported on 03/22/2016    . lidocaine-prilocaine (EMLA) cream   3  . Multiple Vitamin (MULTIVITAMIN) tablet Take 1 tablet by mouth daily.    . non-metallic deodorant Jethro Poling) MISC Apply 1 application topically daily as needed.    Marland Kitchen  omeprazole (PRILOSEC) 40 MG capsule Take 1 capsule (40 mg total) by mouth daily. 90 capsule 4  . Pyridoxine HCl (VITAMIN B6 PO) Take by mouth.     No current facility-administered medications for this visit.     OBJECTIVE: Middle-aged white womanWho appears well Wall  Vitals:   12/11/16 1100  BP: (!) 118/54  Pulse: 77  Resp: 18  Temp: 97.9 F (36.6 C)     Body mass index is 33.3 kg/m.    ECOG FS:1 - Symptomatic but completely ambulatory  Sclerae unicteric, EOMs intact Oropharynx clear and moist No cervical or supraclavicular adenopathy Lungs no rales or rhonchi Heart regular rate and rhythm Abd soft, nontender, positive bowel sounds MSK no focal spinal tenderness, no upper extremity lymphedema Neuro: nonfocal, well oriented, appropriate affect Breasts: The right breast is status post lumpectomy and radiation. In general the  cosmetic result is very good. She does have some skin coarsening in the anterior peri-areolar area, and this is expected. I do not find any unusual induration. I think the changes she is experiencing and which concern her or all due to radiation and are fairly predictable from that treatment. The right axilla is benign. The left breast is unremarkable.    LAB RESULTS:  CMP     Component Value Date/Time   NA 142 11/20/2016 1035   K 4.3 11/20/2016 1035   CO2 29 11/20/2016 1035   GLUCOSE 91 11/20/2016 1035   BUN 20.9 11/20/2016 1035   CREATININE 0.9 11/20/2016 1035   CALCIUM 9.9 11/20/2016 1035   PROT 7.0 11/20/2016 1035   ALBUMIN 4.0 11/20/2016 1035   AST 23 11/20/2016 1035   ALT 14 11/20/2016 1035   ALKPHOS 72 11/20/2016 1035   BILITOT 1.31 (H) 11/20/2016 1035    INo results found for: SPEP, UPEP  Lab Results  Component Value Date   WBC 4.8 12/11/2016   NEUTROABS 2.8 12/11/2016   HGB 12.7 12/11/2016   HCT 38.8 12/11/2016   MCV 81.7 12/11/2016   PLT 191 12/11/2016      Chemistry      Component Value Date/Time   NA 142 11/20/2016 1035   K 4.3 11/20/2016 1035   CO2 29 11/20/2016 1035   BUN 20.9 11/20/2016 1035   CREATININE 0.9 11/20/2016 1035      Component Value Date/Time   CALCIUM 9.9 11/20/2016 1035   ALKPHOS 72 11/20/2016 1035   AST 23 11/20/2016 1035   ALT 14 11/20/2016 1035   BILITOT 1.31 (H) 11/20/2016 1035       No results found for: LABCA2  No components found for: LABCA125  No results for input(s): INR in the last 168 hours.  Urinalysis    Component Value Date/Time   BILIRUBINUR neg 12/29/2015 1014   PROTEINUR pos 12/29/2015 1014   UROBILINOGEN negative 12/29/2015 1014   NITRITE neg 12/29/2015 1014   LEUKOCYTESUR Negative 12/29/2015 1014   STUDIES: Transthoracic Echocardiography  Patient:    Terri Mayer, Terri Mayer MR #:       [id number] Study Date: 10/22/2016 Gender:     F Age:        40 Height:     167.6 cm Weight:     93 kg BSA:         2.12 m^2 Pt. Status: Room:   SONOGRAPHER  Tresa Res, RDCS  ATTENDING    Bensimhon, Lanae Crumbly     Bensimhon, Mayer  REFERRING    [place] [place],  Outpatient  REFERRING    [name]  cc:  ------------------------------------------------------------------- LV EF: 55% -   60%    ASSESSMENT: 66 y.o. [place] woman status post right breast lower outer quadrant and right axillary lymph node biopsy 11/16/2015, also positive for a clinical  T1c pN1, stage IIA invasive ductal carcinoma grade 3, essentially estrogen and progesterone receptor negative but HER-2 amplified, with an MIB-1 of 30%  (a) the breast lesion had moderate positivity for the estrogen receptor at 10%; the axillary lymph node lesion was estrogen receptor negative. Both were progesterone receptor negative  (1) neoadjuvant chemotherapy with carboplatin, docetaxel, trastuzumab and pertuzumab for 6 cycles, started 12/18/2015  (a) docetaxel switched to gemcitabine for cycles 4-6 because of progressive neuropathy  (b) pertuzumab dose cut in half with cycle 4 due to excessive diarrhea, resumed full dose cycle 5  (2) trastuzumab continued to total one year, last dose 12/11/2016  (a) baseline echocardiogram on 12/12/15 showed an EF of 60-65%  (b) echocardiogram 04/19/2016 showed an ejection fraction in the 50-55% range  (c) final echocardiogram 10/22/2016 shows an ejection fraction in the 55-60% range. He  (3) right lumpectomy and sentinel lymph node sampling 05/02/2016 showed a complete pathologic response (ypT0, ypN0)  (4) adjuvant radiation 06/17/3016-07/12/2016 1. Right breast boost// 7.5 Gy with 3 fractions at a dose of 2.5 Gy/ fraction 2. Right breast// 42.5 Gy with 17 fractions at a dose of 2.5 Gy/ fraction  PLAN: Terri Mayer completes her year of trastuzumab today. This is a real milestone for her. The best part of it is that she has a very good prognosis and I'm not anticipating  recurrence in her case.  She is dissatisfied with the effects of radiation in the breast. I think perhaps this was not fully explained to her before the treatments, but in any case what she is experiencing is not uncommon, is due to the radiation itself, and it is an unfortunate side effect of the treatment. The cosmetic result is still good however area is just that the sensation in the breast is different and it may remain that way.  She still has some peripheral neuropathy in her feet. I have urged her when she is walking on uneven pigment to use pulse or a walking stick. In the meantime she never did have physical therapy so I have referred her for that.  She has already signed up for the [place] program, which I strongly encouraged to follow-up.  Otherwise I am setting her up for mammography in June and December again in July. She knows to call for any problems that may develop before that visit.  Chauncey Cruel, MD   12/11/2016 11:09 AM

## 2016-12-12 ENCOUNTER — Ambulatory Visit: Payer: PPO | Attending: Oncology | Admitting: Physical Therapy

## 2016-12-12 ENCOUNTER — Telehealth: Payer: Self-pay | Admitting: *Deleted

## 2016-12-12 DIAGNOSIS — M25611 Stiffness of right shoulder, not elsewhere classified: Secondary | ICD-10-CM | POA: Diagnosis not present

## 2016-12-12 DIAGNOSIS — M6281 Muscle weakness (generalized): Secondary | ICD-10-CM | POA: Insufficient documentation

## 2016-12-12 DIAGNOSIS — R296 Repeated falls: Secondary | ICD-10-CM | POA: Insufficient documentation

## 2016-12-12 DIAGNOSIS — M25511 Pain in right shoulder: Secondary | ICD-10-CM

## 2016-12-12 NOTE — Therapy (Addendum)
[place], [place], [address] Phone: [phone]   Fax:  [phone]  Physical Therapy Evaluation  Patient Details  Name: Terri Mayer MRN: [id number] Date of Birth: [birth year] Referring Provider: Dr. Jana Hakim   Encounter Date: 12/12/2016      PT End of Session - 12/12/16 1650    Visit Number 1   Number of Visits 9   Date for PT Re-Evaluation 01/16/17   PT Start Time 1400  pt arrived late for appt   PT Stop Time 1440   PT Time Calculation (min) 40 min   Activity Tolerance Patient tolerated treatment well   Behavior During Therapy [facility] for tasks assessed/performed      Past Medical History:  Diagnosis Date  . Allergy   . Breast cancer ([place])   . History of chicken pox   . Osteoarthritis    Neck,Back,Knees,Hips,Ankles    Past Surgical History:  Procedure Laterality Date  . DILATION AND CURETTAGE OF UTERUS    . EYE SURGERY  at age 88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  . PORTACATH PLACEMENT Right 12/11/2015   Procedure: INSERTION PORT-A-CATH WITH ULTRASOUND;  Surgeon: Rolm Bookbinder, MD;  Location: [facility];  Service: General;  Laterality: Right;  . RADIOACTIVE SEED GUIDED PARTIAL MASTECTOMY/AXILLARY SENTINEL NODE BIOPSY/AXILLARY NODE DISSECTION Right 05/02/2016   Procedure: RIGHT BREAST SEED GUIDED LUMPECTOMY AND RIGHT SEED TARGETED AXILLARY DISSECTION,AND SENTINEL NODE BIOPSY;  Surgeon: Rolm Bookbinder, MD;  Location: [facility];  Service: General;  Laterality: Right;  RIGHT BREAST SEED GUIDED LUMPECTOMY AND RIGHT SEED TARGETED AXILLARY DISSECTION,AND SENTINEL NODE BIOPSY  . WISDOM TOOTH EXTRACTION      There were no vitals filed for this visit.       Subjective Assessment - 12/12/16 1403    Subjective Pt had a fall an injured her shoulder . She still has numbness in her feet and thinks that may be what she has to live with. She says that she has to be more  careful when she walks.  She lost 60 pounds when she went through treatment    Pertinent History right breast cancer with chemotherapy, lumpectomy with 5 lymph nodes removed, followed by radiation completed on July 12 2016. Fall that injured her shoulder in October but did not have xrays at that time    Patient Stated Goals I need to know what to do to avoid swelling in my arm.  wants to avoid sugery on her right shoulder , but wants to get rid of the pain in her shoulder , wants to increase range of motion in her shoulder             [facility] PT Assessment - 12/12/16 0001      Assessment   Medical Diagnosis right breast cancer    Referring Provider Dr. Jana Hakim    Onset Date/Surgical Date 11/14/15   Hand Dominance Right   Prior Therapy none      Precautions   Precautions Fall   Precaution Comments hx of peripheral neuropathy      Restrictions   Weight Bearing Restrictions No     Balance Screen   Has the patient fallen in the past 6 months Yes   How many times? 2  associated with dehydration during chemo and peripheral neur   Has the patient had a decrease in activity level because of a fear of falling?  Yes   Is the patient reluctant to leave their home  because of a fear of falling?  Yes     [place] Private residence   Living Arrangements Alone   Available Help at Discharge Family;Friend(s);Available PRN/intermittently     Prior Function   Level of Independence Independent   Vocation Retired   Leisure wants to get back to walking, yoga, exercise.  Plans to start [place] as soon as they call her      Cognition   Overall Cognitive Status Within Functional Limits for tasks assessed     Observation/Other Assessments   Observations pt appears to have generalized muscle atophy ,  she wears a stretchy undershirt and is concerned a compression bra may hurt her back    Skin Integrity well healed incisions in right axilla and breast    Quick DASH   61.36     Observation/Other Assessments-Edema    Edema --  pt feels she has edema in right axilla      Coordination   Gross Motor Movements are Fluid and Coordinated --  pt states she moves "carefully"      Posture/Postural Control   Posture/Postural Control Postural limitations   Postural Limitations Rounded Shoulders;Forward head;Decreased thoracic kyphosis     ROM / Strength   AROM / PROM / Strength AROM;PROM;Strength     AROM   Overall AROM Comments decreased right scapular stabalization wtih bilateral arm elevation.    Right/Left Shoulder Right;Left   Right Shoulder Flexion 125 Degrees   Right Shoulder ABduction 80 Degrees  80   Right Shoulder External Rotation 60 Degrees  approximate   Left Shoulder Flexion 155 Degrees   Left Shoulder ABduction 155 Degrees   Left Shoulder External Rotation 80 Degrees     Strength   Overall Strength Comments gneralized muscle deconditioning.  Pt reports 60# weight loss during chemotherapy with recent weight gain of 15#    Right Shoulder Flexion 3-/5  pain with resisted isometrics   Right Shoulder ABduction 3-/5  pain with resisted isomtrics    Left Shoulder Flexion 4-/5   Left Shoulder ABduction 4-/5     Palpation   Palpation comment fullness palpated in right axilla      Special Tests   Rotator Cuff Impingment tests Hawkins- Kennedy test;Painful Arc of Motion     Hawkins-Kennedy test   Findings Positive   Side Right     Painful Arc of Motion   Findings Positive   Side Right   Comments limited in end range of motion            LYMPHEDEMA/ONCOLOGY QUESTIONNAIRE - 12/12/16 1422      Right Upper Extremity Lymphedema   15 cm Proximal to Olecranon Process 34 cm   10 cm Proximal to Olecranon Process 30 cm   Olecranon Process 26 cm   15 cm Proximal to Ulnar Styloid Process 24.5 cm   10 cm Proximal to Ulnar Styloid Process 22 cm   Just Proximal to Ulnar Styloid Process 15.5 cm   Across Hand at [employer] 19 cm    At [place] of 2nd Digit 6.5 cm     Left Upper Extremity Lymphedema   15 cm Proximal to Olecranon Process 33 cm   10 cm Proximal to Olecranon Process 31 cm   Olecranon Process 27 cm   15 cm Proximal to Ulnar Styloid Process 24.5 cm   10 cm Proximal to Ulnar Styloid Process 22 cm   Just Proximal to Ulnar Styloid Process 15.5 cm   Across  Hand at [employer] 29 cm   At [place] of 2nd Digit 6.4 cm           Katina Dung - 12/12/16 0001    Open a tight or new jar Severe difficulty   Do heavy household chores (wash walls, wash floors) Severe difficulty   Carry a shopping bag or briefcase Moderate difficulty   Wash your back Moderate difficulty   Use a knife to cut food Moderate difficulty   Recreational activities in which you take some force or impact through your arm, shoulder, or hand (golf, hammering, tennis) Severe difficulty   During the past week, to what extent has your arm, shoulder or hand problem interfered with your normal social activities with family, friends, neighbors, or groups? Quite a bit   During the past week, to what extent has your arm, shoulder or hand problem limited your work or other regular daily activities Quite a bit   Arm, shoulder, or hand pain. Severe   Tingling (pins and needles) in your arm, shoulder, or hand Mild   Difficulty Sleeping Moderate difficulty   DASH Score 61.36 %                     PT Education - 12/12/16 1651    Education provided Yes   Education Details handouts for ABC class and exercise classess at the cancer center   Person(s) Educated Patient   Methods Explanation   Comprehension Verbalized understanding                [place] - 12/12/16 1659      CC Long Term Goal  #1   Title Patient with verbalize an understanding of lymphedema risk reduction precautions   Time 4   Period Weeks   Status New     CC Long Term Goal  #2   Title Patient will report a decrease in pain by 50% so they can  perform daily activities with greater ease   Time 4   Period Weeks   Status New     CC Long Term Goal  #3   Title Patient will improve shoulder flexion range of motion to 150 degrees to perform activities of daily living and household chores with greater ease   Baseline 125 on 12/12/2016   Time 4   Period Weeks     CC Long Term Goal  #4   Title Patient will be independent in a  home exercise program for shoulder ROM and Strength, and for balance improvement    Time 4   Period Weeks   Status New     CC Long Term Goal  #5   Title Patient will report she has started a community  exercise program to increase general strength and balance    Time 4   Period Weeks   Status New            Plan - 12/12/16 1652    Clinical Impression Statement 66 yo female with extensive treatement for breast cancer with deconditioning, peripheral neuropathy and falls with injury to right shoulder in addition to imprairment to right shoulder from lumpectomy with 5 nodes removed and radiation.  He pain and debility continue to evolve.  For these reasons this is a moderate complexity eval.    Rehab Potential Good   Clinical Impairments Affecting Rehab Potential peripheral neuropathy, falls, 5 nodes removed, radiation to upper quadrant    PT Frequency 2x / week  PT Duration 4 weeks   PT Treatment/Interventions ADLs/Self Care Home Management;Patient/family education;Orthotic Fit/Training;DME Instruction;Taping;Electrical Stimulation;Therapeutic activities;Manual techniques;Manual lymph drainage;Therapeutic exercise;Iontophoresis 55m/ml Dexamethasone;Compression bandaging;Scar mobilization;Passive range of motion;Neuromuscular re-education;Balance training   PT Next Visit Plan Assure pt has signed up for ABC class and follow up community exercise.  Issue walking program Do TUG and gait speed test for baselines for balance. begin scapular retraining and postural exercise. and progress to Shoulder ROM and  strengthening    Recommended Other Services ABC class    Consulted and Agree with Plan of Care Patient      Patient will benefit from skilled therapeutic intervention in order to improve the following deficits and impairments:  Decreased endurance, Obesity, Increased edema, Decreased knowledge of precautions, Decreased knowledge of use of DME, Decreased strength, Increased fascial restricitons, Impaired UE functional use, Pain, Decreased range of motion, Impaired perceived functional ability, Postural dysfunction  Visit Diagnosis: Right shoulder pain, unspecified chronicity - Plan: PT plan of care cert/re-cert  Stiffness of right shoulder, not elsewhere classified - Plan: PT plan of care cert/re-cert  Muscle weakness (generalized) - Plan: PT plan of care cert/re-cert  Repeated falls - Plan: PT plan of care cert/re-cert      G-Codes - 024/46/281702    Functional Assessment Tool Used Quick DASH    Functional Limitation Carrying, moving and handling objects   Carrying, Moving and Handling Objects Current Status ((M3817 At least 60 percent but less than 80 percent impaired, limited or restricted   Carrying, Moving and Handling Objects Goal Status ((R1165 At least 40 percent but less than 60 percent impaired, limited or restricted       Problem List Patient Active Problem List   Diagnosis Date Noted  . Tachycardia 06/21/2016  . Diarrhea 12/29/2015  . Dysuria 12/29/2015  . Skin irritation 12/29/2015  . Lower extremity edema 12/12/2015  . Malignant neoplasm of upper-inner quadrant of right breast in female, estrogen receptor negative ([facility] 11/30/2015  . Intermittent palpitations 11/30/2015  . Obesity 11/30/2015  . Viral syndrome 09/03/2011   TDonato Heinz BOwens SharkPT  BNorwood Levo1/25/2018, 5:04 PM  [place] [address]Phone: [phone]  Fax:  [phone] Name: Terri FEUTZMRN: [id number] of Birth: 8May 22, 1952PHYSICAL THERAPY DISCHARGE SUMMARY  Visits from Start of Care: 1 Current functional level related to goals / functional outcomes: unknown   Remaining deficits: Unknown    Education / Equipment: As above  Plan: Patient agrees to discharge.  Patient goals were not met. Patient is being discharged due to not returning since the last visit.  ?????    TMaudry Diego PT 05/08/18 10:57 AM

## 2016-12-12 NOTE — Telephone Encounter (Signed)
Called pt to congratulate on completion of herceptin. Encourage pt to call with any questions or needs. Received verbal understanding. Contact information provided.

## 2016-12-25 DIAGNOSIS — Z853 Personal history of malignant neoplasm of breast: Secondary | ICD-10-CM | POA: Diagnosis not present

## 2016-12-25 DIAGNOSIS — Z452 Encounter for adjustment and management of vascular access device: Secondary | ICD-10-CM | POA: Diagnosis not present

## 2017-01-06 ENCOUNTER — Other Ambulatory Visit (INDEPENDENT_AMBULATORY_CARE_PROVIDER_SITE_OTHER): Payer: PPO

## 2017-01-06 ENCOUNTER — Encounter: Payer: Self-pay | Admitting: Internal Medicine

## 2017-01-06 ENCOUNTER — Ambulatory Visit (INDEPENDENT_AMBULATORY_CARE_PROVIDER_SITE_OTHER): Payer: PPO | Admitting: Internal Medicine

## 2017-01-06 ENCOUNTER — Telehealth: Payer: Self-pay | Admitting: Internal Medicine

## 2017-01-06 VITALS — BP 138/80 | HR 71 | Temp 97.6°F | Ht 66.0 in | Wt 202.0 lb

## 2017-01-06 DIAGNOSIS — Z0000 Encounter for general adult medical examination without abnormal findings: Secondary | ICD-10-CM | POA: Diagnosis not present

## 2017-01-06 LAB — LIPID PANEL
CHOL/HDL RATIO: 5
Cholesterol: 205 mg/dL — ABNORMAL HIGH (ref 0–200)
HDL: 42.1 mg/dL (ref >39.00)
LDL Cholesterol: 139 mg/dL — ABNORMAL HIGH (ref 0–99)
NONHDL: 162.84
Triglycerides: 121 mg/dL (ref 0.0–149.0)
VLDL: 24.2 mg/dL (ref 0.0–40.0)

## 2017-01-06 LAB — HEMOGLOBIN A1C: HEMOGLOBIN A1C: 5.7 % (ref 4.6–6.5)

## 2017-01-06 LAB — VITAMIN B12: VITAMIN B 12: 820 pg/mL (ref 211–911)

## 2017-01-06 LAB — TSH: TSH: 2.05 u[IU]/mL (ref 0.35–4.50)

## 2017-01-06 LAB — T4, FREE: Free T4: 1.06 ng/dL (ref 0.60–1.60)

## 2017-01-06 LAB — VITAMIN D 25 HYDROXY (VIT D DEFICIENCY, FRACTURES): VITD: 22.5 ng/mL — AB (ref 30.00–100.00)

## 2017-01-06 NOTE — Progress Notes (Signed)
   Subjective:    Patient ID: Terri Mayer, female    DOB: 10-28-1951, 66 y.o.   MRN: [id number]  HPI Here for medicare wellness and physical, no new complaints. Please see A/P for status and treatment of chronic medical problems.   Diet: heart healthy Physical activity: sedentary, increasing activity gradually Depression/mood screen: negative Hearing: intact to whispered voice Visual acuity: grossly normal with lens, performs annual eye exam  ADLs: capable Fall risk: medium with her chemo induced neuropathy Home safety: good Cognitive evaluation: intact to orientation, naming, recall and repetition EOL planning: adv directives discussed  I have personally reviewed and have noted 1. The patient's medical and social history - reviewed today no changes 2. Their use of alcohol, tobacco or illicit drugs 3. Their current medications and supplements 4. The patient's functional ability including ADL's, fall risks, home safety risks and hearing or visual impairment. 5. Diet and physical activities 6. Evidence for depression or mood disorders 7. Care team reviewed and updated (available in snapshot)  Review of Systems  Constitutional: Positive for activity change and fatigue. Negative for appetite change, chills, fever and unexpected weight change.  HENT: Negative.   Eyes: Negative.   Respiratory: Negative for cough, chest tightness, shortness of breath and wheezing.   Cardiovascular: Negative.   Gastrointestinal: Negative.   Musculoskeletal: Negative.   Skin: Negative.   Neurological: Positive for numbness. Negative for dizziness, tremors, facial asymmetry, weakness and headaches.  Psychiatric/Behavioral: Negative.       Objective:   Physical Exam  Constitutional: She is oriented to person, place, and time. She appears well-developed and well-nourished.  HENT:  Head: Normocephalic and atraumatic.  Eyes: EOM are normal.  Neck: Normal range of motion.  Cardiovascular: Normal  rate and regular rhythm.   Pulmonary/Chest: Effort normal and breath sounds normal. No respiratory distress. She has no wheezes. She has no rales.  Abdominal: Soft. She exhibits no distension. There is no tenderness. There is no rebound.  Musculoskeletal: She exhibits no edema.  Neurological: She is alert and oriented to person, place, and time.  Skin: Skin is warm and dry.  No callusing on the feet, toenails great with nail changes.   Psychiatric: She has a normal mood and affect.   Vitals:   01/06/17 0911  BP: 138/80  Pulse: 71  Temp: 97.6 F (36.4 C)  TempSrc: Oral  SpO2: 98%  Weight: 202 lb (91.6 kg)  Height: 5\' 6"  (1.676 m)      Assessment & Plan:

## 2017-01-06 NOTE — Patient Instructions (Signed)
We are checking the blood work today to make sure that the numbness in the feet and hands is not also caused by some vitamin or thyroid problems.   Dr. Paulla Dolly is a great foot specialist and you can call and make sure he is covered ([facility]).   Health Maintenance, Female Introduction Adopting a healthy lifestyle and getting preventive care can go a long way to promote health and wellness. Talk with your health care provider about what schedule of regular examinations is right for you. This is a good chance for you to check in with your provider about disease prevention and staying healthy. In between checkups, there are plenty of things you can do on your own. Experts have done a lot of research about which lifestyle changes and preventive measures are most likely to keep you healthy. Ask your health care provider for more information. Weight and diet Eat a healthy diet  Be sure to include plenty of vegetables, fruits, low-fat dairy products, and lean protein.  Do not eat a lot of foods high in solid fats, added sugars, or salt.  Get regular exercise. This is one of the most important things you can do for your health.  Most adults should exercise for at least 150 minutes each week. The exercise should increase your heart rate and make you sweat (moderate-intensity exercise).  Most adults should also do strengthening exercises at least twice a week. This is in addition to the moderate-intensity exercise. Maintain a healthy weight  Body mass index (BMI) is a measurement that can be used to identify possible weight problems. It estimates body fat based on height and weight. Your health care provider can help determine your BMI and help you achieve or maintain a healthy weight.  For females 100 years of age and older:  A BMI below 18.5 is considered underweight.  A BMI of 18.5 to 24.9 is normal.  A BMI of 25 to 29.9 is considered overweight.  A BMI of 30 and above is considered  obese. Watch levels of cholesterol and blood lipids  You should start having your blood tested for lipids and cholesterol at 67 years of age, then have this test every 5 years.  You may need to have your cholesterol levels checked more often if:  Your lipid or cholesterol levels are high.  You are older than 67 years of age.  You are at high risk for heart disease. Cancer screening Lung Cancer  Lung cancer screening is recommended for adults 89-84 years old who are at high risk for lung cancer because of a history of smoking.  A yearly low-dose CT scan of the lungs is recommended for people who:  Currently smoke.  Have quit within the past 15 years.  Have at least a 30-pack-year history of smoking. A pack year is smoking an average of one pack of cigarettes a day for 1 year.  Yearly screening should continue until it has been 15 years since you quit.  Yearly screening should stop if you develop a health problem that would prevent you from having lung cancer treatment. Breast Cancer  Practice breast self-awareness. This means understanding how your breasts normally appear and feel.  It also means doing regular breast self-exams. Let your health care provider know about any changes, no matter how small.  If you are in your 20s or 30s, you should have a clinical breast exam (CBE) by a health care provider every 1-3 years as part of a regular  health exam.  If you are 40 or older, have a CBE every year. Also consider having a breast X-ray (mammogram) every year.  If you have a family history of breast cancer, talk to your health care provider about genetic screening.  If you are at high risk for breast cancer, talk to your health care provider about having an MRI and a mammogram every year.  Breast cancer gene (BRCA) assessment is recommended for women who have family members with BRCA-related cancers. BRCA-related cancers include:  Breast.  Ovarian.  Tubal.  Peritoneal  cancers.  Results of the assessment will determine the need for genetic counseling and BRCA1 and BRCA2 testing. Cervical Cancer  Your health care provider may recommend that you be screened regularly for cancer of the pelvic organs (ovaries, uterus, and vagina). This screening involves a pelvic examination, including checking for microscopic changes to the surface of your cervix (Pap test). You may be encouraged to have this screening done every 3 years, beginning at age 48.  For women ages 18-65, health care providers may recommend pelvic exams and Pap testing every 3 years, or they may recommend the Pap and pelvic exam, combined with testing for human papilloma virus (HPV), every 5 years. Some types of HPV increase your risk of cervical cancer. Testing for HPV may also be done on women of any age with unclear Pap test results.  Other health care providers may not recommend any screening for nonpregnant women who are considered low risk for pelvic cancer and who do not have symptoms. Ask your health care provider if a screening pelvic exam is right for you.  If you have had past treatment for cervical cancer or a condition that could lead to cancer, you need Pap tests and screening for cancer for at least 20 years after your treatment. If Pap tests have been discontinued, your risk factors (such as having a new sexual partner) need to be reassessed to determine if screening should resume. Some women have medical problems that increase the chance of getting cervical cancer. In these cases, your health care provider may recommend more frequent screening and Pap tests. Colorectal Cancer  This type of cancer can be detected and often prevented.  Routine colorectal cancer screening usually begins at 67 years of age and continues through 67 years of age.  Your health care provider may recommend screening at an earlier age if you have risk factors for colon cancer.  Your health care provider may also  recommend using home test kits to check for hidden blood in the stool.  A small camera at the end of a tube can be used to examine your colon directly (sigmoidoscopy or colonoscopy). This is done to check for the earliest forms of colorectal cancer.  Routine screening usually begins at age 46.  Direct examination of the colon should be repeated every 5-10 years through 67 years of age. However, you may need to be screened more often if early forms of precancerous polyps or small growths are found. Skin Cancer  Check your skin from head to toe regularly.  Tell your health care provider about any new moles or changes in moles, especially if there is a change in a mole's shape or color.  Also tell your health care provider if you have a mole that is larger than the size of a pencil eraser.  Always use sunscreen. Apply sunscreen liberally and repeatedly throughout the day.  Protect yourself by wearing long sleeves, pants, a wide-brimmed hat,  and sunglasses whenever you are outside. Heart disease, diabetes, and high blood pressure  High blood pressure causes heart disease and increases the risk of stroke. High blood pressure is more likely to develop in:  People who have blood pressure in the high end of the normal range (130-139/85-89 mm Hg).  People who are overweight or obese.  People who are African American.  If you are 35-109 years of age, have your blood pressure checked every 3-5 years. If you are 71 years of age or older, have your blood pressure checked every year. You should have your blood pressure measured twice-once when you are at a hospital or clinic, and once when you are not at a hospital or clinic. Record the average of the two measurements. To check your blood pressure when you are not at a hospital or clinic, you can use:  An automated blood pressure machine at a pharmacy.  A home blood pressure monitor.  If you are between 22 years and 29 years old, ask your health  care provider if you should take aspirin to prevent strokes.  Have regular diabetes screenings. This involves taking a blood sample to check your fasting blood sugar level.  If you are at a normal weight and have a low risk for diabetes, have this test once every three years after 67 years of age.  If you are overweight and have a high risk for diabetes, consider being tested at a younger age or more often. Preventing infection Hepatitis B  If you have a higher risk for hepatitis B, you should be screened for this virus. You are considered at high risk for hepatitis B if:  You were born in a country where hepatitis B is common. Ask your health care provider which countries are considered high risk.  Your parents were born in a high-risk country, and you have not been immunized against hepatitis B (hepatitis B vaccine).  You have HIV or AIDS.  You use needles to inject street drugs.  You live with someone who has hepatitis B.  You have had sex with someone who has hepatitis B.  You get hemodialysis treatment.  You take certain medicines for conditions, including cancer, organ transplantation, and autoimmune conditions. Hepatitis C  Blood testing is recommended for:  Everyone born from [birth year] through 1966.  Anyone with known risk factors for hepatitis C. Sexually transmitted infections (STIs)  You should be screened for sexually transmitted infections (STIs) including gonorrhea and chlamydia if:  You are sexually active and are younger than 67 years of age.  You are older than 67 years of age and your health care provider tells you that you are at risk for this type of infection.  Your sexual activity has changed since you were last screened and you are at an increased risk for chlamydia or gonorrhea. Ask your health care provider if you are at risk.  If you do not have HIV, but are at risk, it may be recommended that you take a prescription medicine daily to prevent HIV  infection. This is called pre-exposure prophylaxis (PrEP). You are considered at risk if:  You are sexually active and do not regularly use condoms or know the HIV status of your partner(s).  You take drugs by injection.  You are sexually active with a partner who has HIV. Talk with your health care provider about whether you are at high risk of being infected with HIV. If you choose to begin PrEP, you should first be  tested for HIV. You should then be tested every 3 months for as long as you are taking PrEP. Pregnancy  If you are premenopausal and you may become pregnant, ask your health care provider about preconception counseling.  If you may become pregnant, take 400 to 800 micrograms (mcg) of folic acid every day.  If you want to prevent pregnancy, talk to your health care provider about birth control (contraception). Osteoporosis and menopause  Osteoporosis is a disease in which the bones lose minerals and strength with aging. This can result in serious bone fractures. Your risk for osteoporosis can be identified using a bone density scan.  If you are 75 years of age or older, or if you are at risk for osteoporosis and fractures, ask your health care provider if you should be screened.  Ask your health care provider whether you should take a calcium or vitamin D supplement to lower your risk for osteoporosis.  Menopause may have certain physical symptoms and risks.  Hormone replacement therapy may reduce some of these symptoms and risks. Talk to your health care provider about whether hormone replacement therapy is right for you. Follow these instructions at home:  Schedule regular health, dental, and eye exams.  Stay current with your immunizations.  Do not use any tobacco products including cigarettes, chewing tobacco, or electronic cigarettes.  If you are pregnant, do not drink alcohol.  If you are breastfeeding, limit how much and how often you drink alcohol.  Limit  alcohol intake to no more than 1 drink per day for nonpregnant women. One drink equals 12 ounces of beer, 5 ounces of wine, or 1 ounces of hard liquor.  Do not use street drugs.  Do not share needles.  Ask your health care provider for help if you need support or information about quitting drugs.  Tell your health care provider if you often feel depressed.  Tell your health care provider if you have ever been abused or do not feel safe at home. This information is not intended to replace advice given to you by your health care provider. Make sure you discuss any questions you have with your health care provider. Document Released: 05/20/2011 Document Revised: 04/11/2016 Document Reviewed: 08/08/2015  2017 [employer]

## 2017-01-06 NOTE — Telephone Encounter (Signed)
Patient is looking for Dr. Nathanial Millman recommendation on a podiatrist.

## 2017-01-06 NOTE — Telephone Encounter (Signed)
This should be on her paperwork.

## 2017-01-06 NOTE — Progress Notes (Signed)
Pre visit review using our clinic review tool, if applicable. No additional management support is needed unless otherwise documented below in the visit note. 

## 2017-01-06 NOTE — Telephone Encounter (Signed)
Patient contacted and stated awareness and is going to look on papers

## 2017-01-06 NOTE — Assessment & Plan Note (Signed)
Flu shot done, declines pneumonia vaccine today, should not have shingles yet due to recent chemo. Talked to her about colonoscopy and she would like time to think about this. Checking labs to ensure her neuropathy is not worsened by other etiology. Counseled on sun safety and mole surveillance. Counseled on fall prevention. Given 10 year screening recommendations.

## 2017-01-10 ENCOUNTER — Telehealth: Payer: Self-pay

## 2017-01-10 NOTE — Telephone Encounter (Addendum)
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  She has finished chemo and immunotherapy.  Rn, please call either way, yes or no.  This RN called pt and inquired further. " he did not mention this at my last visit and family member was asking about this and I just wanted to verify his plan"   Noted per initial visit- MD stated breast tumor weakly ER + but lymph nodes were negative and pt would obtain greatest benefit per chemo and HER 2Neu therapy.  Annete had questions regarding antiestrogens - questions answered as well as for this note to be given to MD for his recommendations.

## 2017-01-14 ENCOUNTER — Telehealth: Payer: Self-pay | Admitting: *Deleted

## 2017-01-14 ENCOUNTER — Other Ambulatory Visit: Payer: Self-pay | Admitting: Oncology

## 2017-01-14 ENCOUNTER — Telehealth: Payer: Self-pay | Admitting: Oncology

## 2017-01-14 NOTE — Telephone Encounter (Signed)
Notified pt MD will discuss concerns at appt on 3/29. Pt verbalized understanding. No further concerns.

## 2017-01-14 NOTE — Telephone Encounter (Signed)
sw pt to confirm lab/MD appt 3/29 at noon per LOS

## 2017-02-03 ENCOUNTER — Telehealth: Payer: Self-pay | Admitting: Emergency Medicine

## 2017-02-03 NOTE — Telephone Encounter (Signed)
Spoke with patient; per Dr Jana Hakim; advised patient that she needs to be taking Vitamin D 3000mg  daily. Patient verbalized understanding and advised to call this office for any concerns.

## 2017-02-13 ENCOUNTER — Other Ambulatory Visit (HOSPITAL_BASED_OUTPATIENT_CLINIC_OR_DEPARTMENT_OTHER): Payer: PPO

## 2017-02-13 ENCOUNTER — Ambulatory Visit (HOSPITAL_BASED_OUTPATIENT_CLINIC_OR_DEPARTMENT_OTHER): Payer: PPO | Admitting: Oncology

## 2017-02-13 VITALS — BP 118/56 | HR 77 | Temp 97.4°F | Resp 18 | Ht 66.0 in | Wt 206.3 lb

## 2017-02-13 DIAGNOSIS — C773 Secondary and unspecified malignant neoplasm of axilla and upper limb lymph nodes: Secondary | ICD-10-CM | POA: Diagnosis not present

## 2017-02-13 DIAGNOSIS — Z171 Estrogen receptor negative status [ER-]: Principal | ICD-10-CM

## 2017-02-13 DIAGNOSIS — C50211 Malignant neoplasm of upper-inner quadrant of right female breast: Secondary | ICD-10-CM

## 2017-02-13 LAB — CBC WITH DIFFERENTIAL/PLATELET
BASO%: 0.5 % (ref 0.0–2.0)
Basophils Absolute: 0 10*3/uL (ref 0.0–0.1)
EOS%: 3.3 % (ref 0.0–7.0)
Eosinophils Absolute: 0.2 10*3/uL (ref 0.0–0.5)
HCT: 40.1 % (ref 34.8–46.6)
HGB: 13.2 g/dL (ref 11.6–15.9)
LYMPH%: 25.3 % (ref 14.0–49.7)
MCH: 27.2 pg (ref 25.1–34.0)
MCHC: 32.9 g/dL (ref 31.5–36.0)
MCV: 82.7 fL (ref 79.5–101.0)
MONO#: 0.7 10*3/uL (ref 0.1–0.9)
MONO%: 10.9 % (ref 0.0–14.0)
NEUT%: 60 % (ref 38.4–76.8)
NEUTROS ABS: 3.9 10*3/uL (ref 1.5–6.5)
Platelets: 221 10*3/uL (ref 145–400)
RBC: 4.85 10*6/uL (ref 3.70–5.45)
RDW: 13.7 % (ref 11.2–14.5)
WBC: 6.5 10*3/uL (ref 3.9–10.3)
lymph#: 1.6 10*3/uL (ref 0.9–3.3)

## 2017-02-13 LAB — COMPREHENSIVE METABOLIC PANEL
ALT: 15 U/L (ref 0–55)
AST: 21 U/L (ref 5–34)
Albumin: 3.9 g/dL (ref 3.5–5.0)
Alkaline Phosphatase: 69 U/L (ref 40–150)
Anion Gap: 5 mEq/L (ref 3–11)
BUN: 26.9 mg/dL — ABNORMAL HIGH (ref 7.0–26.0)
CO2: 34 meq/L — AB (ref 22–29)
Calcium: 10.5 mg/dL — ABNORMAL HIGH (ref 8.4–10.4)
Chloride: 103 mEq/L (ref 98–109)
Creatinine: 1 mg/dL (ref 0.6–1.1)
EGFR: 61 mL/min/{1.73_m2} — AB (ref >90)
GLUCOSE: 92 mg/dL (ref 70–140)
POTASSIUM: 4.8 meq/L (ref 3.5–5.1)
SODIUM: 142 meq/L (ref 136–145)
TOTAL PROTEIN: 6.9 g/dL (ref 6.4–8.3)
Total Bilirubin: 1.28 mg/dL — ABNORMAL HIGH (ref 0.20–1.20)

## 2017-02-13 MED ORDER — ANASTROZOLE 1 MG PO TABS: 1.0000 mg | ORAL_TABLET | Freq: Every day | ORAL | 4 refills | Status: DC

## 2017-02-13 NOTE — Progress Notes (Signed)
[facility]  [phone] Fax:([phone]) [phone]   ID: Terri Mayer DOB: 27-Jan-1951  MR#: [id number]  JTT#:[id number]  Patient Care Team: Terri Koch, MD as PCP - General (Internal Medicine) Terri Cruel, MD as Consulting Physician (Oncology) Terri Bookbinder, MD as Consulting Physician (General Surgery) Terri Bruins, MD as Consulting Physician (Obstetrics and Gynecology) Terri Artist, MD as Consulting Physician (Cardiology) Terri Spates, MD as Consulting Physician (Gastroenterology) PCP: Terri Koch, MD GYN: SU:  OTHER MD:  CHIEF COMPLAINT: right breast cancer  CURRENT TREATMENT: Observation  BREAST CANCER HISTORY: From the original intake note:  "Terri Mayer" underwent bilateral screening mammography with tomosynthesis at the breast center 11/02/2015 showing a possible mass in the right breast and low right axillary area. She was called back for right diagnostic mammography with tomosynthesis and right ultrasonography 11/14/2015. The breast density was category B. There was a lobulated mass in the lateral right breast, with a smaller oval mass immediately adjacent to it. Also in the upper outer quadrant there was a third mass. This has been stable back to 2014 and is felt to be a fibroadenoma.  Ultrasound of the right breast confirmed a hypoechoic mass at the 3:00 position 6 cm from the nipple, measuring 1.4 cm. Adjacent to this there was a 0.5 cm internal mammary lymph node, with normal morphology and normal hilar fat. The mass at the 10:00 position measured 0.8 cm and again was consistent with a fibroadenoma. In the right axilla there was an abnormal lymph node with no internal fat measuring 1.8 cm. The rest of the exam was unremarkable.  Biopsy of the right breast 8:30 o'clock lesion and the right axillary lymph node on 11/16/2015 showed ([id number]) both to be positive for invasive ductal carcinoma, grade 3, the breast mass  being estrogen receptor 10% positive with moderate staining intensity, but progesterone receptor negative, but the lymph node being estrogen and progesterone both negative. Both the breast and lymph nodes had MIB-1 of 30%. Both were positive for HER-2 amplification, the signals ratio is being 6.06 and 7.61, and the number per cell 10.00 and 11.80.  The patient's subsequent history is as detailed below   INTERVAL HISTORY: Terri Mayer returns today for follow-up of her estrogen receptor negative breast cancer. Interval history is generally unremarkable. She is doing the [employer], does yoga 1 or 3 times a week and [facility] [employer] When she gets home she is a little bit sleepy and sometimes after lunch she takes a nap. She sometimes up at night but is not because of the daytime naps she says but because she is worried about her sister in [place], who has problems of her own.  REVIEW OF SYSTEMS: Terri Mayer's hair has come in quite strong. She denies any unusual headaches, visual changes, cough, phlegm production, or pleurisy. She denies pain, unexplained fatigue or unexplained weight loss. Overall a detailed review of systems today was benign  PAST MEDICAL HISTORY: Past Medical History:  Diagnosis Date  . Allergy   . Breast cancer ([place])   . History of chicken pox   . Osteoarthritis    Neck,Back,Knees,Hips,Ankles    PAST SURGICAL HISTORY: Past Surgical History:  Procedure Laterality Date  . DILATION AND CURETTAGE OF UTERUS    . EYE SURGERY  at age 65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  . PORTACATH PLACEMENT Right 12/11/2015   Procedure: INSERTION PORT-A-CATH WITH ULTRASOUND;  Surgeon: Terri Bookbinder, MD;  Location: [facility];  Service: General;  Laterality: Right;  . RADIOACTIVE SEED GUIDED PARTIAL MASTECTOMY/AXILLARY SENTINEL NODE BIOPSY/AXILLARY NODE DISSECTION Right 05/02/2016   Procedure: RIGHT BREAST SEED GUIDED LUMPECTOMY AND RIGHT SEED TARGETED AXILLARY DISSECTION,AND SENTINEL  NODE BIOPSY;  Surgeon: Terri Bookbinder, MD;  Location: [facility];  Service: General;  Laterality: Right;  RIGHT BREAST SEED GUIDED LUMPECTOMY AND RIGHT SEED TARGETED AXILLARY DISSECTION,AND SENTINEL NODE BIOPSY  . WISDOM TOOTH EXTRACTION      FAMILY HISTORY Family History  Problem Relation Age of Onset  . Heart disease Father   . Diabetes      GM  . Colon cancer Neg Hx   . Breast cancer Neg Hx   . Dementia      M  . Transient ischemic attack      F   the patient's father died after multiple strokes at the age of 44. The patient's mother died at the age of 45 with Alzheimer's disease. The patient had one brother, one sister. The patient's paternal grandfather died from lung cancer in his 74s. On the mother's side there are 2 (second) cousins with breast cancer diagnosed in their early 20s, and a more remote cousin diagnosed with breast cancer at age 42. There is no history of ovarian cancer.   GYNECOLOGIC HISTORY:  No LMP recorded. Patient is postmenopausal.  menarche age 25, the patient is GX P0 and she understands that not carrying a child to term before the age of 71 essentially doubled the risk of breast cancer. She stopped having periods between age 78 and 26 and did not take hormone replacement. 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 She lives by herself, with her pet dog. Her son, adopted at birth, Terri Mayer, Graduates from [employer] in the psychology Department May 2018.     ADVANCED DIRECTIVES: [name] tells me as of March 2018 her sister Terri Mayer in [place] is still her healthcare power of attorney. She can be reached at [phone]. However she is intending to name her son Terri Mayer as her healthcare part of attorney. He can be reached at [phone]. In the second slot she will name her sister-in-law Terri [place]  who is vice president of a hospital, to assist her son. Terri Mayer can be reached at [phone]. The patient's sister will no longer be her healthcare part of attorney.    HEALTH MAINTENANCE: Social History  Substance Use Topics  . Smoking status: Never Smoker  . Smokeless tobacco: Never Used  . Alcohol use Yes     Comment: rarely      Colonoscopy: 12/28/2004  PAP:  Bone density:  Lipid panel:  Allergies  Allergen Reactions  . Codeine Nausea And Vomiting    Current Outpatient Prescriptions  Medication Sig Dispense Refill  . Cholecalciferol (VITAMIN D3 PO) Take by mouth.    Marland Kitchen ibuprofen (ADVIL,MOTRIN) 200 MG tablet Take 200 mg by mouth every 6 (six) hours as needed for fever or mild pain. Reported on 03/22/2016    . Multiple Vitamin (MULTIVITAMIN) tablet Take 1 tablet by mouth daily.     No current facility-administered medications for this visit.     OBJECTIVE: Middle-aged white woman in no acute distress Wall  Vitals:   02/13/17 1148  BP: (!) 118/56  Pulse: 77  Resp: 18  Temp: 97.4 F (36.3 C)  Body mass index is 33.3 kg/m.    ECOG FS:0 - Asymptomatic  Sclerae unicteric, pupils round and equal Oropharynx clear and moist No cervical or supraclavicular adenopathy Lungs no rales or rhonchi Heart regular rate and rhythm Abd soft, obese, nontender, positive bowel sounds MSK no focal spinal tenderness, no upper extremity lymphedema Neuro: nonfocal, well oriented, appropriate affect Breasts: The right breast is status post lumpectomy followed by radiation with no evidence of local recurrence. There is the expected pigmentation changes and thickening of the periareolar skin. The right axilla is benign. The left breast and left axilla are unremarkable.   LAB RESULTS:  CMP     Component Value Date/Time   NA 144 12/11/2016 1049   K 4.3 12/11/2016 1049   CO2 29 12/11/2016 1049   GLUCOSE 96 12/11/2016 1049   BUN 26.3 (H) 12/11/2016 1049   CREATININE 0.9 12/11/2016  1049   CALCIUM 9.8 12/11/2016 1049   PROT 6.8 12/11/2016 1049   ALBUMIN 3.7 12/11/2016 1049   AST 22 12/11/2016 1049   ALT 14 12/11/2016 1049   ALKPHOS 60 12/11/2016 1049   BILITOT 1.01 12/11/2016 1049    INo results found for: SPEP, UPEP  Lab Results  Component Value Date   WBC 6.5 02/13/2017   NEUTROABS 3.9 02/13/2017   HGB 13.2 02/13/2017   HCT 40.1 02/13/2017   MCV 82.7 02/13/2017   PLT 221 02/13/2017      Chemistry      Component Value Date/Time   NA 144 12/11/2016 1049   K 4.3 12/11/2016 1049   CO2 29 12/11/2016 1049   BUN 26.3 (H) 12/11/2016 1049   CREATININE 0.9 12/11/2016 1049      Component Value Date/Time   CALCIUM 9.8 12/11/2016 1049   ALKPHOS 60 12/11/2016 1049   AST 22 12/11/2016 1049   ALT 14 12/11/2016 1049   BILITOT 1.01 12/11/2016 1049       No results found for: LABCA2  No components found for: LABCA125  No results for input(s): INR in the last 168 hours. He treated her with either give her a couple of days of Neupogen afterwards for this week in February, she'll be low next week as he is absolutely 168 31 of uterine ASSESSMENT: 66 y.o. [place] woman status post right breast lower outer quadrant and right axillary lymph node biopsy 11/16/2015, also positive for a clinical  T1c pN1, stage IIA invasive ductal carcinoma grade 3, essentially estrogen and progesterone receptor negative but HER-2 amplified, with an MIB-1 of 30%  (a) the breast lesion had moderate positivity for the estrogen receptor at 10%; the axillary lymph node lesion was estrogen receptor negative. Both were progesterone receptor negative  (1) neoadjuvant chemotherapy with carboplatin, docetaxel, trastuzumab and pertuzumab for 6 cycles, started 12/18/2015  (a) docetaxel switched to gemcitabine for cycles 4-6 because of progressive neuropathy  (b) pertuzumab dose cut in half with cycle 4 due to excessive diarrhea, resumed full dose cycle 5  (2) trastuzumab continued to total  one year, last dose 12/11/2016  (a) baseline echocardiogram on 12/12/15 showed an EF of 60-65%  (b) echocardiogram 04/19/2016 showed an ejection fraction in the 50-55% range  (c) final echocardiogram 10/22/2016 shows an ejection fraction in the 55-60% range. He  (3) right lumpectomy and sentinel lymph node sampling 05/02/2016 showed a complete pathologic response (ypT0, ypN0)  (4) adjuvant radiation 06/17/3016-07/12/2016 1. Right breast boost// 7.5 Gy with 3 fractions at a dose of 2.5 Gy/ fraction 2. Right breast// 42.5 Gy  with 17 fractions at a dose of 2.5 Gy/ fraction  (5) anastrozole started 02/13/2017  (a recall initial breast biopsy showed 10% estrogen positivity with moderate staining intensity)    PLAN: Terri Mayer will soon be a year out from definitive surgery for her breast cancer with no evidence of disease activity. This is favorable.  She is doing terrific in terms of an exercise program. She is just beginning to change her diet. Her goal is to lose approximately 25 pounds in the next 6 months. I think this is doable for her.  Today we discussed advanced directive is years and she is changing her healthcare part of attorney documents to name her son as her healthcare power of attorney. He is just graduating from college and from her account sounds were responsible. Her sister-in-law and this is not her sister) will be second in  [place] reminded me that her tumor was moderately estrogen receptor positive at baseline. I think even though the likelihood of benefit is low it is not 0 and if she can tolerate anastrozole well I would favor her taking that for 5 years.  We discussed the possible toxicities side effects and complications of this agent in detail. She will started today. She will let me know if there is a problem before her return visit here in 3 months. When she undergoes her next mammogram she will also have a baseline bone density  She knows to call for any other problems that may  develop before her next visit.   Terri Cruel, MD   02/13/2017 12:04 PM

## 2017-02-14 ENCOUNTER — Other Ambulatory Visit: Payer: Self-pay | Admitting: Oncology

## 2017-02-14 DIAGNOSIS — E859 Amyloidosis, unspecified: Secondary | ICD-10-CM

## 2017-03-05 ENCOUNTER — Other Ambulatory Visit: Payer: Self-pay

## 2017-03-05 ENCOUNTER — Telehealth: Payer: Self-pay

## 2017-03-05 ENCOUNTER — Telehealth: Payer: Self-pay | Admitting: *Deleted

## 2017-03-05 MED ORDER — CEPHALEXIN 500 MG PO CAPS: 500.0000 mg | ORAL_CAPSULE | Freq: Three times a day (TID) | ORAL | 0 refills | Status: DC

## 2017-03-05 NOTE — Telephone Encounter (Signed)
Did return call to patient after  Finishing clinic today, she4 has an appt already with med onc and rx for keflex, sounds like mastitis , red, hard warm to touch, thanked me for returning call,she hasn't picked up the antibiotic as yet but will start tonight taking 4:36 PM

## 2017-03-05 NOTE — Telephone Encounter (Signed)
Spoke with pt by phone regarding development of rash, errythema and edema of rt breast.  Pt advised to come to office tomorrow to see Dr Jana Hakim.  Script for Keflex faxed to pt's pharmacy.  Pt verbalized of plan

## 2017-03-06 ENCOUNTER — Other Ambulatory Visit: Payer: Self-pay

## 2017-03-06 ENCOUNTER — Ambulatory Visit (HOSPITAL_BASED_OUTPATIENT_CLINIC_OR_DEPARTMENT_OTHER): Payer: PPO | Admitting: Oncology

## 2017-03-06 ENCOUNTER — Ambulatory Visit
Admission: RE | Admit: 2017-03-06 | Discharge: 2017-03-06 | Disposition: A | Discharge status: Home / Self Care | Payer: PPO | Source: Ambulatory Visit | Attending: Radiation Oncology | Admitting: Radiation Oncology

## 2017-03-06 ENCOUNTER — Encounter: Payer: Self-pay | Admitting: Radiation Oncology

## 2017-03-06 VITALS — BP 102/53 | HR 89 | Temp 98.5°F | Resp 18 | Ht 66.0 in | Wt 202.4 lb

## 2017-03-06 DIAGNOSIS — C50911 Malignant neoplasm of unspecified site of right female breast: Secondary | ICD-10-CM | POA: Diagnosis not present

## 2017-03-06 DIAGNOSIS — L589 Radiodermatitis, unspecified: Secondary | ICD-10-CM

## 2017-03-06 DIAGNOSIS — G629 Polyneuropathy, unspecified: Secondary | ICD-10-CM | POA: Diagnosis not present

## 2017-03-06 DIAGNOSIS — Z9221 Personal history of antineoplastic chemotherapy: Secondary | ICD-10-CM | POA: Diagnosis not present

## 2017-03-06 DIAGNOSIS — Z171 Estrogen receptor negative status [ER-]: Secondary | ICD-10-CM | POA: Insufficient documentation

## 2017-03-06 DIAGNOSIS — R21 Rash and other nonspecific skin eruption: Secondary | ICD-10-CM | POA: Diagnosis not present

## 2017-03-06 DIAGNOSIS — Z79899 Other long term (current) drug therapy: Secondary | ICD-10-CM | POA: Diagnosis not present

## 2017-03-06 DIAGNOSIS — Z885 Allergy status to narcotic agent status: Secondary | ICD-10-CM | POA: Diagnosis not present

## 2017-03-06 DIAGNOSIS — C50211 Malignant neoplasm of upper-inner quadrant of right female breast: Secondary | ICD-10-CM

## 2017-03-06 DIAGNOSIS — R200 Anesthesia of skin: Secondary | ICD-10-CM | POA: Diagnosis not present

## 2017-03-06 DIAGNOSIS — G620 Drug-induced polyneuropathy: Secondary | ICD-10-CM | POA: Diagnosis not present

## 2017-03-06 DIAGNOSIS — G9009 Other idiopathic peripheral autonomic neuropathy: Secondary | ICD-10-CM | POA: Diagnosis not present

## 2017-03-06 DIAGNOSIS — Z79811 Long term (current) use of aromatase inhibitors: Secondary | ICD-10-CM | POA: Diagnosis not present

## 2017-03-06 MED ORDER — METHYLPREDNISOLONE 4 MG PO TBPK: ORAL_TABLET | ORAL | 0 refills | Status: DC

## 2017-03-06 MED ORDER — SILVER SULFADIAZINE 1 % EX CREA
TOPICAL_CREAM | Freq: Every day | CUTANEOUS | Status: DC
  Administered 2017-03-06: 11:00:00 via TOPICAL

## 2017-03-06 NOTE — Progress Notes (Addendum)
Follow uyp  Breat cancer, last radiation  06/17/16-07/12/16, patient just saw Dr. Jana Hakim, who  Sent her her to see MD , our PA Bryson Ha will se patient for possible radiation recall, reddness  rash on back and right breast  Red, slight warm to touch, patient had taken keflix last night and agin this am, but has been stopped by Dr, Jana Hakim, and has written rx for medrol dose pack, to be picked up at her pharmacy today, patient stated this atarted Wednesday, , pain level  Intermittent, states uncomfortable, appetite fair, not on chemotherapy any longer, Saturday had some chills, and hit her hard Sunday stated, stayed in bed Monday and Tuesday,possible virus, next mammogram scheduled June 2018 9:56 AM .BP (!) 102/53 (BP Location: Left Arm, Patient Position: Sitting, Cuff Size: Normal)   Pulse 89   Temp 98.5 F (36.9 C) (Oral)   Resp 18   Ht 5\' 6"  (1.676 m)   Wt 202 lb 6.4 oz (91.8 kg)   SpO2 100% Comment: room air  BMI 32.67 kg/m   Wt Readings from Last 3 Encounters:  03/06/17 202 lb 6.4 oz (91.8 kg)  02/13/17 206 lb 4.8 oz (93.6 kg)  01/06/17 202 lb (91.6 kg)

## 2017-03-06 NOTE — Progress Notes (Addendum)
Radiation Oncology         [phone] ________________________________  Name: Terri Mayer MRN: [id number]  Date: 03/06/2017  DOB: [birth year]  Post Treatment Note  CC: [name], MD  [name], *  Diagnosis:   Stage IIA, T1c,pN1, ER/PR negative, HER2 amplified invasive ductal carcinoma of the right breast.  Interval Since Last Radiation: 8 months  06/17/3016-07/12/2016: 1. Right breast boost// 7.5 Gy with 3 fractions at a dose of 2.5 Gy/ fraction 2. 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 She tolerated radiotherapy well and was last seen in our office for her one month follow up visit after completing radiation. She received Herceptin between January of 2017 and 2018, and her last chemotherapy with Gemzar/Taxotere was in May 2018. She did not have trouble with significant desquamation or skin breakdown during radiation, however comes today after abrupt onset of skin erythema, pruritis, and pain. She reports that yesterday she started having pain and woke up in a cool sweat. When evaluating her skin she saw redness along her right breast that had previously not been seen. She describes intense pain and itching in this location as well as edema of the breast. She reports that this morning she woke up and noted similar symptoms on her posterior chest wall on the right. She was started yesterday by phone on Keflex and comes today for further evaluation. Dr. Jana Hakim has told her to stop Keflex and to start a medrol dosepak. She denies recent changes in medications, soaps, detergents, lotions, or other topical exposures. She also denies any injury to the nipple region or excoriations prior to the redness. She denies any fevers that have been documented. She did have trouble with  pain in the breast prior to this and throughout the joints in her body diffusely. As a result she has not started antiestrogen therapy. She also reports that last week she had abrupt onset of numbness in her fingertips that had not been there to this extent with her previous chemotherapy treatment, though she's had peripheral neuropathy in her feet since chemo. No other complaints are verbalized.                    ALLERGIES:  is allergic to codeine.  Meds: Current Outpatient Prescriptions  Medication Sig Dispense Refill  . Cholecalciferol (VITAMIN D3 PO) Take by mouth.    Marland Kitchen ibuprofen (ADVIL,MOTRIN) 200 MG tablet Take 200 mg by mouth every 6 (six) hours as needed for fever or mild pain. Reported on 03/22/2016    . anastrozole (ARIMIDEX) 1 MG tablet Take 1 tablet (1 mg total) by mouth daily. (Patient not taking: Reported on 03/06/2017) 90 tablet 4  . cephALEXin (KEFLEX) 500 MG capsule Take 1 capsule (500 mg total) by mouth 3 (three) times daily. (Patient not taking: Reported on 03/06/2017) 21 capsule 0  . methylPREDNISolone (MEDROL DOSEPAK) 4 MG TBPK tablet Take 6 tablets today yet I'm going to go back in to see her with [place] 6+5+ 4:15 3+2+1) 60 15th was 620 once 21 tablet with food, 5 tomorrow, 4 the next day and so on until completed (Patient not taking: Reported on 03/06/2017) 21 tablet 0  . Multiple Vitamin (MULTIVITAMIN) tablet Take 1 tablet by mouth daily.  No current facility-administered medications for this encounter.     Physical Findings:  height is _0  (1.676 m) and weight is 202 lb 6.4 oz (91.8 kg). Her oral temperature is 98.5 F (36.9 C). Her blood pressure is 102/53 (abnormal) and her pulse is 89. Her respiration is 18 and oxygen saturation is 100%.  In general this is a well appearing caucasian female in no acute distress. She's alert and oriented x4 and appropriate throughout the examination. Cardiopulmonary assessment is negative for acute distress and she  exhibits normal effort. Her right breast is assessed and reveals edema of the nipple and areola. When asked, she does not wear a bra due to pain since radiation. She instead wears t-shirts or camisoles. She has diffuse erythema of the breast which is warm to the touch without punctate lesions. There is no blistering or vessicles. This extends to the right upper abdomen where the breast also drapes over. On the posterior chest there is much more pronounced erythema in the shape of a square from the middle of her thoracic spine at the midline laterally and is about 12 x 12 cm in greatest dimension. No vessicles or punctate sites are noted though the skin is raised and somewhat indurated.  Lab Findings: Lab Results  Component Value Date   WBC 6.5 02/13/2017   HGB 13.2 02/13/2017   HCT 40.1 02/13/2017   MCV 82.7 02/13/2017   PLT 221 02/13/2017     Radiographic Findings: No results found.  Impression/Plan: 1. Stage IIA, T1c,pN1, ER/PR negative, HER2 amplified invasive ductal carcinoma of the right breast. The patient is doing well overall. The patient will continue discussion of her estrogen blockade with Dr. Jana Hakim.  2. Skin erruption. Her symptoms are impressive though not clinically specific to the concept of radiation recall.  Specifically the finding that is most difficult to process is that her radiation fields did not include the posterior chest wall. The timing of her presentation also does not match with typical presentation of radiation recall. Dr. Lisbeth Renshaw has evaluated the patient as well and would recommend continuation of keflex as well as starting the medrol dosepak that she was given today. We also gave her silvadene to try to soothe the pain she's having.  If her symptoms do not improve with these drugs, we would offer antifungal treatment and if that does not improve, refer to dermatology for further evaluation. She states agreement and I will contact her next week by phone to check on  how she's doing.  3. Neuropathy involving bilateral hands. The patient's abrupt onset of neuropathy does not appear to be related to her skin eruption, however she will continue to discuss this with Dr. Jana Hakim, as it could be related to her previous chemo regimens per his notes.   In a visit lasting 45 minutes, greater than 50% of the time was spent face to face discussing the concept of radiation recall versus unrelated contact dermatitis versus immunomodulated skin conditions, and coordinating the patient's care.    Carola Rhine, PAC

## 2017-03-06 NOTE — Progress Notes (Signed)
[facility]  [phone]) [phone] [phone]) [phone]   ID: Terri Mayer DOB: Sep 14, 1951  MR#: [id number]  MEQ#:[id number]  Patient Care Team: [name], MD as PCP - General (Internal Medicine) Chauncey Cruel, MD as Consulting Physician (Oncology) Rolm Bookbinder, MD as Consulting Physician (General Surgery) Princess Bruins, MD as Consulting Physician (Obstetrics and Gynecology) Jolaine Artist, MD as Consulting Physician (Cardiology) Laurence Spates, MD as Consulting Physician (Gastroenterology) PCP: [name], MD OTHER MD:  CHIEF COMPLAINT: right breast cancer  CURRENT TREATMENT: (Anastrozole)  BREAST CANCER HISTORY: From the original intake note:  "Terri Mayer" underwent bilateral screening mammography with tomosynthesis at the breast center 11/02/2015 showing a possible mass in the right breast and low right axillary area. She was called back for right diagnostic mammography with tomosynthesis and right ultrasonography 11/14/2015. The breast density was category B. There was a lobulated mass in the lateral right breast, with a smaller oval mass immediately adjacent to it. Also in the upper outer quadrant there was a third mass. This has been stable back to 2014 and is felt to be a fibroadenoma.  Ultrasound of the right breast confirmed a hypoechoic mass at the 3:00 position 6 cm from the nipple, measuring 1.4 cm. Adjacent to this there was a 0.5 cm internal mammary lymph node, with normal morphology and normal hilar fat. The mass at the 10:00 position measured 0.8 cm and again was consistent with a fibroadenoma. In the right axilla there was an abnormal lymph node with no internal fat measuring 1.8 cm. The rest of the exam was unremarkable.  Biopsy of the right breast 8:30 o'clock lesion and the right axillary lymph node on 11/16/2015 showed ([id number]) both to be positive for invasive ductal carcinoma, grade 3, the breast mass being estrogen  receptor 10% positive with moderate staining intensity, but progesterone receptor negative, but the lymph node being estrogen and progesterone both negative. Both the breast and lymph nodes had MIB-1 of 30%. Both were positive for HER-2 amplification, the signals ratio is being 6.06 and 7.61, and the number per cell 10.00 and 11.80.  The patient's subsequent history is as detailed below   INTERVAL HISTORY: Terri Mayer returns today for follow-up of her breast cancer. She called the office yesterday to report a rash on her right breast. We thought this might be cellulitis. We called in cephalexin for her and asked her to drop in today. The rash is imaged below. It is a fairly classic radiation recall rash.  Quite aside from that, she tells me a week ago she woke up with numbness in both hands. This affected more the fifth fourth and third digits but also the tips of the second digit in both hands. Even though this developed in the morning she has had a persistent right through the day and all the way to now. Her it feels exactly the way her feet feel. Recall that she has significant neuropathy in both feet.  Because all these problems she has not yet started the anastrozole. She felt if she started that she would not be able to tell whether it was causing problems or not.  REVIEW OF SYSTEMS: Terri Mayer is doing the [employer]. She says when she does that she aches all over and sometimes even has to use a cane she is not taking Tylenol or ibuprofen for this. Aside from that a detailed review of systems today was stable  PAST MEDICAL HISTORY: Past Medical History:  Diagnosis Date  .  Allergy   . Breast cancer ([place])   . History of chicken pox   . Osteoarthritis    Neck,Back,Knees,Hips,Ankles    PAST SURGICAL HISTORY: Past Surgical History:  Procedure Laterality Date  . DILATION AND CURETTAGE OF UTERUS    . EYE SURGERY  at age 54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  . PORTACATH PLACEMENT Right 12/11/2015    Procedure: INSERTION PORT-A-CATH WITH ULTRASOUND;  Surgeon: Rolm Bookbinder, MD;  Location: [facility];  Service: General;  Laterality: Right;  . RADIOACTIVE SEED GUIDED PARTIAL MASTECTOMY/AXILLARY SENTINEL NODE BIOPSY/AXILLARY NODE DISSECTION Right 05/02/2016   Procedure: RIGHT BREAST SEED GUIDED LUMPECTOMY AND RIGHT SEED TARGETED AXILLARY DISSECTION,AND SENTINEL NODE BIOPSY;  Surgeon: Rolm Bookbinder, MD;  Location: [facility];  Service: General;  Laterality: Right;  RIGHT BREAST SEED GUIDED LUMPECTOMY AND RIGHT SEED TARGETED AXILLARY DISSECTION,AND SENTINEL NODE BIOPSY  . WISDOM TOOTH EXTRACTION      FAMILY HISTORY Family History  Problem Relation Age of Onset  . Heart disease Father   . Diabetes      GM  . Colon cancer Neg Hx   . Breast cancer Neg Hx   . Dementia      M  . Transient ischemic attack      F   the patient's father died after multiple strokes at the age of 24. The patient's mother died at the age of 66 with Alzheimer's disease. The patient had one brother, one sister. The patient's paternal grandfather died from lung cancer in his 47s. On the mother's side there are 2 (second) cousins with breast cancer diagnosed in their early 67s, and a more remote cousin diagnosed with breast cancer at age 40. There is no history of ovarian cancer.   GYNECOLOGIC HISTORY:  No LMP recorded. Patient is postmenopausal.  menarche age 55, the patient is GX P0 and she understands that not carrying a child to term before the age of 20 essentially doubled the risk of breast cancer. She stopped having periods between age 60 and 82 and did not take hormone replacement. 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 She lives by herself, with her pet dog. Her son, adopted at birth, Aesha Agrawal, Graduates from [employer] in the psychology  Department May 2018.     ADVANCED DIRECTIVES: Daiana tells me as of March 2018 her sister [name] in [place] is still her healthcare power of attorney. She can be reached at [phone]. However she is intending to name her son [name] as her healthcare part of attorney. He can be reached at [phone]. In the second slot she will name her sister-in-law [name] who is vice president of a hospital, to assist her son. [name] can be reached at [phone]. The patient's sister will no longer be her healthcare part of attorney.    HEALTH MAINTENANCE: Social History  Substance Use Topics  . Smoking status: Never Smoker  . Smokeless tobacco: Never Used  . Alcohol use Yes     Comment: rarely      Colonoscopy: 12/28/2004  PAP:  Bone density:  Lipid panel:  Allergies  Allergen Reactions  . Codeine Nausea And Vomiting    Current Outpatient Prescriptions  Medication Sig Dispense Refill  . anastrozole (ARIMIDEX) 1 MG tablet Take 1 tablet (1 mg total) by mouth daily. 90 tablet 4  .  cephALEXin (KEFLEX) 500 MG capsule Take 1 capsule (500 mg total) by mouth 3 (three) times daily. 21 capsule 0  . Cholecalciferol (VITAMIN D3 PO) Take by mouth.    Marland Kitchen ibuprofen (ADVIL,MOTRIN) 200 MG tablet Take 200 mg by mouth every 6 (six) hours as needed for fever or mild pain. Reported on 03/22/2016    . Multiple Vitamin (MULTIVITAMIN) tablet Take 1 tablet by mouth daily.     No current facility-administered medications for this visit.    Photo 03/06/2017   OBJECTIVE: Middle-aged white woman who appears stated age [place]  There were no vitals filed for this visit.   There is no height or weight on file to calculate BMI.    ECOG FS:1 - Symptomatic but completely ambulatory  Sclerae unicteric, EOMs intact Oropharynx clear and moist No cervical or supraclavicular adenopathy Lungs no rales or rhonchi Heart regular rate and rhythm Abd soft, nontender, positive bowel sounds MSK no focal spinal  tenderness, no upper extremity lymphedema Neuro: nonfocal, well oriented, appropriate affect Breasts: The right breast is status post lumpectomy and radiation. There is erythema not only in the breast but throughout the radiation port area both front and back. The back is imaged above. There is no evidence of disease recurrence. The left breast is unremarkable. Both axillae are benign.  LAB RESULTS:  CMP     Component Value Date/Time   NA 142 02/13/2017 1136   K 4.8 02/13/2017 1136   CO2 34 (H) 02/13/2017 1136   GLUCOSE 92 02/13/2017 1136   BUN 26.9 (H) 02/13/2017 1136   CREATININE 1.0 02/13/2017 1136   CALCIUM 10.5 (H) 02/13/2017 1136   PROT 6.9 02/13/2017 1136   ALBUMIN 3.9 02/13/2017 1136   AST 21 02/13/2017 1136   ALT 15 02/13/2017 1136   ALKPHOS 69 02/13/2017 1136   BILITOT 1.28 (H) 02/13/2017 1136    INo results found for: SPEP, UPEP  Lab Results  Component Value Date   WBC 6.5 02/13/2017   NEUTROABS 3.9 02/13/2017   HGB 13.2 02/13/2017   HCT 40.1 02/13/2017   MCV 82.7 02/13/2017   PLT 221 02/13/2017      Chemistry      Component Value Date/Time   NA 142 02/13/2017 1136   K 4.8 02/13/2017 1136   CO2 34 (H) 02/13/2017 1136   BUN 26.9 (H) 02/13/2017 1136   CREATININE 1.0 02/13/2017 1136      Component Value Date/Time   CALCIUM 10.5 (H) 02/13/2017 1136   ALKPHOS 69 02/13/2017 1136   AST 21 02/13/2017 1136   ALT 15 02/13/2017 1136   BILITOT 1.28 (H) 02/13/2017 1136       No results found for: LABCA2  No components found for: LABCA125  No results for input(s): INR in the last 168 hours.  ASSESSMENT: 66 y.o. [place] woman status post right breast lower outer quadrant and right axillary lymph node biopsy 11/16/2015, also positive for a clinical  T1c pN1, stage IIA invasive ductal carcinoma grade 3, essentially estrogen and progesterone receptor negative but HER-2 amplified, with an MIB-1 of 30%  (a) the breast lesion had moderate positivity for the  estrogen receptor at 10%; the axillary lymph node lesion was estrogen receptor negative. Both were progesterone receptor negative  (1) neoadjuvant chemotherapy with carboplatin, docetaxel, trastuzumab and pertuzumab for 6 cycles, started 12/18/2015  (a) docetaxel switched to gemcitabine for cycles 4-6 because of progressive neuropathy  (b) pertuzumab dose cut in half with cycle 4 due to excessive diarrhea, resumed  full dose cycle 5  (2) trastuzumab continued to total one year, last dose 12/11/2016  (a) baseline echocardiogram on 12/12/15 showed an EF of 60-65%  (b) echocardiogram 04/19/2016 showed an ejection fraction in the 50-55% range  (c) final echocardiogram 10/22/2016 shows an ejection fraction in the 55-60% range. He  (3) right lumpectomy and sentinel lymph node sampling 05/02/2016 showed a complete pathologic response (ypT0, ypN0)  (4) adjuvant radiation 06/17/3016-07/12/2016 1. Right breast boost// 7.5 Gy with 3 fractions at a dose of 2.5 Gy/ fraction 2. Right breast// 42.5 Gy with 17 fractions at a dose of 2.5 Gy/ fraction  (5) anastrozole to be started May 2018  (a) initial breast biopsy showed 10% estrogen positivity with moderate staining intensity  (6) radiation recall rash 03/06/2017   PLAN: Terri Mayer has developed 2 new problems and one of them is radiation recall. When she called yesterday without this might be cellulitis and we started her on Keflex, but she can stop that now. I am putting her on a Medrol Dosepak. I expect this to clear over the next week and she will call us next week just to confirm that.  The second issue is peripheral neuropathy in both hands more in the ulnar than radial distribution. This developed initially in the morning but it has persisted. To her it is just like her feet.  It is unusual to develop neuropathy this are out from chemotherapy. I think there may be an element of carpal tunnel here. I suggested she get a wrist splint and wearing on her right  wrist. She is right-handed. If she notices significant improvement after a week or 2 in the right side but not the less than she has her diagnosis and her treatment.  I did offer to refer her to a neurologist for further evaluation but she declines at this point.  She has not started the anastrozole because she really has not felt stable enough. I think that's perceptive of her. Hopefully she can get it started in May once the current problems resolve  I encouraged her to continue on her exercise plan. If he feels really achy that is secondary to deconditioning and it is fine for her to take an ibuprofen. That is preferable to her having to use a cane or not exercising  She is scheduled to see me in July. She will let us know however how she is doing next week and if any other problems develop before the next visit.  Chauncey Cruel, MD   03/06/2017 9:09 AM

## 2017-03-06 NOTE — Progress Notes (Signed)
Applied silvadene to patient 's back where rednees is, no moist desquamation seen, flat , then placed telfa and abd dressing over site, and gave mesh tubing to secure as patient not wearing a bra, to apply daily  After bath or shower,  Must remove cream before re applying, gave extra supplies for the patient,  Dr. Lisbeth Renshaw MD  And Bryson Ha in with the patient 10:49 AM

## 2017-05-12 ENCOUNTER — Ambulatory Visit
Admission: RE | Admit: 2017-05-12 | Discharge: 2017-05-12 | Disposition: A | Discharge status: Home / Self Care | Payer: PPO | Source: Ambulatory Visit | Attending: Oncology | Admitting: Oncology

## 2017-05-12 ENCOUNTER — Other Ambulatory Visit: Payer: PPO

## 2017-05-12 DIAGNOSIS — R928 Other abnormal and inconclusive findings on diagnostic imaging of breast: Secondary | ICD-10-CM | POA: Diagnosis not present

## 2017-05-12 DIAGNOSIS — C50211 Malignant neoplasm of upper-inner quadrant of right female breast: Secondary | ICD-10-CM

## 2017-05-12 DIAGNOSIS — Z780 Asymptomatic menopausal state: Secondary | ICD-10-CM | POA: Diagnosis not present

## 2017-05-12 DIAGNOSIS — M85851 Other specified disorders of bone density and structure, right thigh: Secondary | ICD-10-CM | POA: Diagnosis not present

## 2017-05-12 DIAGNOSIS — Z171 Estrogen receptor negative status [ER-]: Principal | ICD-10-CM

## 2017-05-12 HISTORY — DX: Personal history of irradiation: Z92.3

## 2017-05-20 ENCOUNTER — Other Ambulatory Visit: Payer: Self-pay | Admitting: Dermatology

## 2017-05-20 DIAGNOSIS — D492 Neoplasm of unspecified behavior of bone, soft tissue, and skin: Secondary | ICD-10-CM | POA: Diagnosis not present

## 2017-05-20 DIAGNOSIS — L821 Other seborrheic keratosis: Secondary | ICD-10-CM | POA: Diagnosis not present

## 2017-05-20 DIAGNOSIS — L603 Nail dystrophy: Secondary | ICD-10-CM | POA: Diagnosis not present

## 2017-05-20 DIAGNOSIS — L820 Inflamed seborrheic keratosis: Secondary | ICD-10-CM | POA: Diagnosis not present

## 2017-05-20 DIAGNOSIS — D229 Melanocytic nevi, unspecified: Secondary | ICD-10-CM | POA: Diagnosis not present

## 2017-05-26 ENCOUNTER — Ambulatory Visit (HOSPITAL_BASED_OUTPATIENT_CLINIC_OR_DEPARTMENT_OTHER): Payer: PPO | Admitting: Oncology

## 2017-05-26 VITALS — BP 110/59 | HR 78 | Temp 98.9°F | Resp 17 | Ht 66.0 in | Wt 218.7 lb

## 2017-05-26 DIAGNOSIS — C773 Secondary and unspecified malignant neoplasm of axilla and upper limb lymph nodes: Secondary | ICD-10-CM

## 2017-05-26 DIAGNOSIS — Z171 Estrogen receptor negative status [ER-]: Secondary | ICD-10-CM | POA: Diagnosis not present

## 2017-05-26 DIAGNOSIS — C50211 Malignant neoplasm of upper-inner quadrant of right female breast: Secondary | ICD-10-CM

## 2017-05-26 NOTE — Progress Notes (Signed)
Terri Mayer  [phone] [phone]   ID: Terri Mayer DOB: 1951-05-18  MR#: [id number]  [name]  Patient Care Team: [name], MD as PCP - General (Internal Medicine) Magrinat, Virgie Dad, MD as Consulting Physician (Oncology) Rolm Bookbinder, MD as Consulting Physician (General Surgery) Princess Bruins, MD as Consulting Physician (Obstetrics and Gynecology) Haroldine Laws, Shaune Pascal, MD as Consulting Physician (Cardiology) Laurence Spates, MD as Consulting Physician (Gastroenterology) PCP: [name], MD OTHER MD:  CHIEF COMPLAINT: right breast cancer  CURRENT TREATMENT: Anastrozole  BREAST CANCER HISTORY: From the original intake note:  "Terri Mayer" underwent bilateral screening mammography with tomosynthesis at the breast center 11/02/2015 showing a possible mass in the right breast and low right axillary area. She was called back for right diagnostic mammography with tomosynthesis and right ultrasonography 11/14/2015. The breast density was category B. There was a lobulated mass in the lateral right breast, with a smaller oval mass immediately adjacent to it. Also in the upper outer quadrant there was a third mass. This has been stable back to 2014 and is felt to be a fibroadenoma.  Ultrasound of the right breast confirmed a hypoechoic mass at the 3:00 position 6 cm from the nipple, measuring 1.4 cm. Adjacent to this there was a 0.5 cm internal mammary lymph node, with normal morphology and normal hilar fat. The mass at the 10:00 position measured 0.8 cm and again was consistent with a fibroadenoma. In the right axilla there was an abnormal lymph node with no internal fat measuring 1.8 cm. The rest of the exam was unremarkable.  Biopsy of the right breast 8:30 o'clock lesion and the right axillary lymph node on 11/16/2015 showed ([id number]) both to be positive for invasive ductal carcinoma, grade 3, the breast mass being  estrogen receptor 10% positive with moderate staining intensity, but progesterone receptor negative, but the lymph node being estrogen and progesterone both negative. Both the breast and lymph nodes had MIB-1 of 30%. Both were positive for HER-2 amplification, the signals ratio is being 6.06 and 7.61, and the number per cell 10.00 and 11.80.  The patient's subsequent history is as detailed below   INTERVAL HISTORY: Terri Mayer returns today for follow-up of her HER-2/neu positive breast cancer. She feels she is getting stronger all the time. [facility] [employer] [facility] and continues to go to the wife for exercise. She is active in her church and of course does a lot of reading. She doesn't feel ready to travel yet.  She tells me her son has finally moved out and gotten his own apartment. He has a job in Engineer, technical sales.  REVIEW OF SYSTEMS: A detailed review of systems today was otherwise noncontributory  PAST MEDICAL HISTORY: Past Medical History:  Diagnosis Date  . Allergy   . Breast cancer ([place])   . History of chicken pox   . Osteoarthritis    Neck,Back,Knees,Hips,Ankles  . Personal history of radiation therapy     PAST SURGICAL HISTORY: Past Surgical History:  Procedure Laterality Date  . DILATION AND CURETTAGE OF UTERUS    . EYE SURGERY  at age 89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  . PORTACATH PLACEMENT Right 12/11/2015   Procedure: INSERTION PORT-A-CATH WITH ULTRASOUND;  Surgeon: Rolm Bookbinder, MD;  Location: [facility];  Service: General;  Laterality: Right;  . RADIOACTIVE SEED GUIDED PARTIAL MASTECTOMY/AXILLARY SENTINEL NODE BIOPSY/AXILLARY NODE DISSECTION Right 05/02/2016   Procedure: RIGHT BREAST SEED GUIDED LUMPECTOMY AND RIGHT SEED TARGETED  AXILLARY DISSECTION,AND SENTINEL NODE BIOPSY;  Surgeon: Rolm Bookbinder, MD;  Location: [facility];  Service: General;  Laterality: Right;  RIGHT BREAST SEED GUIDED LUMPECTOMY AND RIGHT SEED TARGETED AXILLARY  DISSECTION,AND SENTINEL NODE BIOPSY  . WISDOM TOOTH EXTRACTION      FAMILY HISTORY Family History  Problem Relation Age of Onset  . Heart disease Father   . Diabetes Unknown        GM  . Dementia Unknown        M  . Transient ischemic attack Unknown        F  . Colon cancer Neg Hx   . Breast cancer Neg Hx    the patient's father died after multiple strokes at the age of 71. The patient's mother died at the age of 71 with Alzheimer's disease. The patient had one brother, one sister. The patient's paternal grandfather died from lung cancer in his 46s. On the mother's side there are 2 (second) cousins with breast cancer diagnosed in their early 108s, and a more remote cousin diagnosed with breast cancer at age 92. There is no history of ovarian cancer.   GYNECOLOGIC HISTORY:  No LMP recorded. Patient is postmenopausal.  menarche age 61, the patient is GX P0 and she understands that not carrying a child to term before the age of 51 essentially doubled the risk of breast cancer. She stopped having periods between age 10 and 40 and did not take hormone replacement. 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 She lives by herself, with her pet dog. Her son, adopted at birth, Terri Mayer, Graduates from [employer] in the psychology Department May 2018.     ADVANCED DIRECTIVES: Terri Mayer tells me as of March 2018 her sister Terri Mayer in [place] is still her healthcare power of attorney. She can be reached at [phone]. However she is intending to name her son Terri Mayer as her healthcare part of attorney. He can be reached at [phone]. In the second slot she will name her sister-in-law Terri Mayer who is vice president of a hospital, to assist her son. Terri Mayer can be reached at [phone]. The patient's sister will no longer be her healthcare part of  attorney.    HEALTH MAINTENANCE: Social History  Substance Use Topics  . Smoking status: Never Smoker  . Smokeless tobacco: Never Used  . Alcohol use Yes     Comment: rarely      Colonoscopy: 12/28/2004  PAP:  Bone density:  Lipid panel:  Allergies  Allergen Reactions  . Codeine Nausea And Vomiting    Current Outpatient Prescriptions  Medication Sig Dispense Refill  . anastrozole (ARIMIDEX) 1 MG tablet Take 1 tablet (1 mg total) by mouth daily. (Patient not taking: Reported on 03/06/2017) 90 tablet 4  . cephALEXin (KEFLEX) 500 MG capsule Take 1 capsule (500 mg total) by mouth 3 (three) times daily. (Patient not taking: Reported on 03/06/2017) 21 capsule 0  . Cholecalciferol (VITAMIN D3 PO) Take by mouth.    Marland Kitchen ibuprofen (ADVIL,MOTRIN) 200 MG tablet Take 200 mg by mouth every 6 (six) hours as needed for fever or mild pain. Reported on 03/22/2016    . methylPREDNISolone (MEDROL DOSEPAK) 4 MG TBPK tablet Take 6 tablets today, 5 tomorrow, 4 the next day and so on until completed.  Take with food. 21 tablet 0  .  Multiple Vitamin (MULTIVITAMIN) tablet Take 1 tablet by mouth daily.    . silver sulfADIAZINE (SILVADENE) 1 % cream Apply 1 application topically daily. Apply daily to rash on back and on right breast, must remove cream before re applying     No current facility-administered medications for this visit.     OBJECTIVE: Middle-aged white woman In no acute distress Vitals:   05/26/17 1137  BP: (!) 110/59  Pulse: 78  Resp: 17  Temp: 98.9 F (37.2 C)     Body mass index is 35.3 kg/m.    ECOG FS:0 - Asymptomatic  Sclerae unicteric, pupils round and equal Oropharynx clear and moist No cervical or supraclavicular adenopathy Lungs no rales or rhonchi Heart regular rate and rhythm Abd soft, nontender, positive bowel sounds MSK no focal spinal tenderness, no upper extremity lymphedema Neuro: nonfocal, well oriented, appropriate affect Breasts: The right breast has undergone  lumpectomy and radiation with no evidence of local recurrence. The left breast is unremarkable. Both axillae are benign.  LAB RESULTS:  CMP     Component Value Date/Time   NA 142 02/13/2017 1136   K 4.8 02/13/2017 1136   CO2 34 (H) 02/13/2017 1136   GLUCOSE 92 02/13/2017 1136   BUN 26.9 (H) 02/13/2017 1136   CREATININE 1.0 02/13/2017 1136   CALCIUM 10.5 (H) 02/13/2017 1136   PROT 6.9 02/13/2017 1136   ALBUMIN 3.9 02/13/2017 1136   AST 21 02/13/2017 1136   ALT 15 02/13/2017 1136   ALKPHOS 69 02/13/2017 1136   BILITOT 1.28 (H) 02/13/2017 1136    INo results found for: SPEP, UPEP  Lab Results  Component Value Date   WBC 6.5 02/13/2017   NEUTROABS 3.9 02/13/2017   HGB 13.2 02/13/2017   HCT 40.1 02/13/2017   MCV 82.7 02/13/2017   PLT 221 02/13/2017      Chemistry      Component Value Date/Time   NA 142 02/13/2017 1136   K 4.8 02/13/2017 1136   CO2 34 (H) 02/13/2017 1136   BUN 26.9 (H) 02/13/2017 1136   CREATININE 1.0 02/13/2017 1136      Component Value Date/Time   CALCIUM 10.5 (H) 02/13/2017 1136   ALKPHOS 69 02/13/2017 1136   AST 21 02/13/2017 1136   ALT 15 02/13/2017 1136   BILITOT 1.28 (H) 02/13/2017 1136       No results found for: LABCA2  No components found for: LABCA125  No results for input(s): INR in the last 168 hours.   STUDIES:  Mammography June 2018 shows a breast density to be category be. There was no evidence of local recurrence.  ASSESSMENT: 66 y.o. [place] woman status post right breast lower outer quadrant and right axillary lymph node biopsy 11/16/2015, also positive for a clinical  T1c pN1, stage IIA invasive ductal carcinoma grade 3, essentially estrogen and progesterone receptor negative but HER-2 amplified, with an MIB-1 of 30%  (a) the breast lesion had moderate positivity for the estrogen receptor at 10%; the axillary lymph node lesion was estrogen receptor negative. Both were progesterone receptor negative  (1) neoadjuvant  chemotherapy with carboplatin, docetaxel, trastuzumab and pertuzumab for 6 cycles, started 12/18/2015  (a) docetaxel switched to gemcitabine for cycles 4-6 because of progressive neuropathy  (b) pertuzumab dose cut in half with cycle 4 due to excessive diarrhea, resumed full dose cycle 5  (2) trastuzumab continued to total one year, last dose 12/11/2016  (a) baseline echocardiogram on 12/12/15 showed an EF of 60-65%  (b) echocardiogram  04/19/2016 showed an ejection fraction in the 50-55% range  (c) final echocardiogram 10/22/2016 shows an ejection fraction in the 55-60% range. He  (3) right lumpectomy and sentinel lymph node sampling 05/02/2016 showed a complete pathologic response (ypT0, ypN0)  (4) adjuvant radiation 06/17/3016-07/12/2016 1. Right breast boost// 7.5 Gy with 3 fractions at a dose of 2.5 Gy/ fraction 2. Right breast// 42.5 Gy with 17 fractions at a dose of 2.5 Gy/ fraction  (5) anastrozole prescribed May 2018 but never started by the patient  (a) initial breast biopsy showed 10% estrogen positivity with moderate staining intensity  (6) radiation recall rash 03/06/2017   PLAN: Terri Mayer is now a year out from definitive surgery for her breast cancer with no evidence of disease recurrence. This is favorable.  She is slowly recovering, but she has gone through a lot of changes, including now on anti-nest, and it is a slow process.  Partly for that reason and partly because of the daunting list of possible side effects she never did start the anastrozole.  We reviewed that at length today. Certainly the benefit we could expect from anastrozole as far as her earlier breast cancer is concerned would be small. I would think a 2-3% risk reduction would be the best we could hope if that.  However prophylactically anastrozole would be more useful. She does have a 1% per year risk of developing another breast cancer and she could easily live 20 years. Anastrozole, that risk in half. Ahead  and started and have a very low bar to stop it. She gets bad hot flashes worsening vaginal dryness or certainly any aches or pains she would simply stop and she would return to the prior status.  She tells me she is going to give it a try and she will call me in a couple of months and let me know how it went.  I suggested seeing her in 6 months but she would prefer to be seen in one year and call if needed and I think that's reasonable in her case. Accordingly the plan will be for her to see me a year from now after her next mammogram      Chauncey Cruel, MD   05/26/2017 11:50 AM

## 2017-06-10 ENCOUNTER — Ambulatory Visit: Payer: PPO | Admitting: Oncology

## 2017-06-10 ENCOUNTER — Other Ambulatory Visit: Payer: PPO

## 2017-06-17 ENCOUNTER — Encounter: Payer: Self-pay | Admitting: Obstetrics & Gynecology

## 2017-06-17 ENCOUNTER — Ambulatory Visit (INDEPENDENT_AMBULATORY_CARE_PROVIDER_SITE_OTHER): Payer: PPO | Admitting: Obstetrics & Gynecology

## 2017-06-17 VITALS — BP 132/86 | Ht 64.0 in | Wt 236.0 lb

## 2017-06-17 DIAGNOSIS — N904 Leukoplakia of vulva: Secondary | ICD-10-CM

## 2017-06-17 DIAGNOSIS — C50211 Malignant neoplasm of upper-inner quadrant of right female breast: Secondary | ICD-10-CM | POA: Diagnosis not present

## 2017-06-17 DIAGNOSIS — Z780 Asymptomatic menopausal state: Secondary | ICD-10-CM

## 2017-06-17 DIAGNOSIS — Z171 Estrogen receptor negative status [ER-]: Secondary | ICD-10-CM

## 2017-06-17 DIAGNOSIS — Z01411 Encounter for gynecological examination (general) (routine) with abnormal findings: Secondary | ICD-10-CM | POA: Diagnosis not present

## 2017-06-17 MED ORDER — CLOBETASOL PROPIONATE 0.05 % EX OINT: 1.0000 "application " | TOPICAL_OINTMENT | CUTANEOUS | 4 refills | Status: DC

## 2017-06-17 NOTE — Patient Instructions (Signed)
1. Encounter for gynecological examination with abnormal finding Gyn exam with Atrophic vaginitis and Lichen sclerosus.  Will repeat Pap next year.  Pap neg 2017.  Breasts wnl.  2. Menopause present No HRT.  No PMB.  Will use [place] if sexually active.  3. Lichen sclerosus of female genitalia Clobetasol 0.05% Ointment represcribed.  Was not using it regularly.  Needs to use a thin layer on affected area twice a week.    4. Malignant neoplasm of upper-inner quadrant of right breast in female, estrogen receptor negative (HCC) Recent Mammo benign.  Terri Mayer, it was a pleasure to see you today!  Let me know if you need me before your next Annual/gyn exam.

## 2017-06-17 NOTE — Progress Notes (Signed)
Terri Mayer 02/27/1951 [id number]   History:    66 y.o.  G0  Divorced. 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 for annual gyn exam   HPI:  Menopause.  No HRT.  Rt Invasive ductal Ca grade 3 with pos Rt axillary LN.  ChemoRx/Surgery done.  No pelvic pain.  Started using a vibrator, used it twice, some discomfort and spotting after using it.  Mictions/BMs wnl.    Past medical history,surgical history, family history and social history were all reviewed and documented in the EPIC chart.  Gynecologic History No LMP recorded. Patient is postmenopausal. Contraception: post menopausal status Last Pap: 05/2016. Results were: normal Last mammogram: 2018. Results were: Benign Colono 09/2016 normal BD 2018  Obstetric History OB History  Gravida Para Term Preterm AB Living  0 0 0 0 0 0  SAB TAB Ectopic Multiple Live Births  0 0 0 0 0         ROS: A ROS was performed and pertinent positives and negatives are included in the history.  GENERAL: No fevers or chills. HEENT: No change in vision, no earache, sore throat or sinus congestion. NECK: No pain or stiffness. CARDIOVASCULAR: No chest pain or pressure. No palpitations. PULMONARY: No shortness of breath, cough or wheeze. GASTROINTESTINAL: No abdominal pain, nausea, vomiting or diarrhea, melena or bright red blood per rectum. GENITOURINARY: No urinary frequency, urgency, hesitancy or dysuria. MUSCULOSKELETAL: No joint or muscle pain, no back pain, no recent trauma. DERMATOLOGIC: No rash, no itching, no lesions. ENDOCRINE: No polyuria, polydipsia, no heat or cold intolerance. No recent change in weight. HEMATOLOGICAL: No anemia or easy bruising or bleeding. NEUROLOGIC: No headache, seizures, numbness, tingling or weakness. PSYCHIATRIC: No depression, no loss of interest in normal activity or change in sleep pattern.     Exam:   BP 132/86   There is no height or weight on file to calculate BMI.  General appearance : Well developed well  nourished female. No acute distress HEENT: Eyes: no retinal hemorrhage or exudates,  Neck supple, trachea midline, no carotid bruits, no thyroidmegaly Lungs: Clear to auscultation, no rhonchi or wheezes, or rib retractions  Heart: Regular rate and rhythm, no murmurs or gallops Breast:Examined in sitting and supine position were symmetrical in appearance, no palpable masses or tenderness,  no skin retraction, no nipple inversion, no nipple discharge, no skin discoloration, no axillary or supraclavicular lymphadenopathy Abdomen: no palpable masses or tenderness, no rebound or guarding Extremities: no edema or skin discoloration or tenderness  Pelvic:  Bartholin, Urethra, Skene Glands: Within normal limits             Vagina: No gross lesions or discharge  Cervix: No gross lesions or discharge  Uterus  AV, normal size, shape and consistency, non-tender and mobile  Adnexa  Without masses or tenderness  Anus and perineum  normal   Assessment/Plan:  66 y.o. female for annual exam   1. Encounter for gynecological examination with abnormal finding Gyn exam with Atrophic vaginitis and Lichen sclerosus.  Will repeat Pap next year.  Pap neg 2017.  Breasts wnl.  2. Menopause present No HRT.  No PMB.  Will use [place] if sexually active.  3. Lichen sclerosus of female genitalia Clobetasol 0.05% Ointment represcribed.  Was not using it regularly.  Needs to use a thin layer on affected area twice a week.    4. Malignant neoplasm of upper-inner quadrant of right breast in female, estrogen receptor negative (HCC) Recent Mammo benign.  Counseling on above issues >50% x 15 minutes.  Princess Bruins MD, 3:00 PM 06/17/2017

## 2017-08-18 ENCOUNTER — Ambulatory Visit: Payer: PPO | Admitting: Internal Medicine

## 2017-09-17 DIAGNOSIS — H53002 Unspecified amblyopia, left eye: Secondary | ICD-10-CM | POA: Diagnosis not present

## 2017-09-17 DIAGNOSIS — H35373 Puckering of macula, bilateral: Secondary | ICD-10-CM | POA: Diagnosis not present

## 2017-09-17 DIAGNOSIS — H04123 Dry eye syndrome of bilateral lacrimal glands: Secondary | ICD-10-CM | POA: Diagnosis not present

## 2017-09-17 DIAGNOSIS — H2513 Age-related nuclear cataract, bilateral: Secondary | ICD-10-CM | POA: Diagnosis not present

## 2018-01-01 ENCOUNTER — Encounter: Payer: Self-pay | Admitting: Internal Medicine

## 2018-01-01 ENCOUNTER — Ambulatory Visit (INDEPENDENT_AMBULATORY_CARE_PROVIDER_SITE_OTHER): Payer: PPO | Admitting: Internal Medicine

## 2018-01-01 NOTE — Progress Notes (Signed)
No charge. 

## 2018-01-08 ENCOUNTER — Encounter: Payer: Self-pay | Admitting: Internal Medicine

## 2018-01-08 ENCOUNTER — Ambulatory Visit (INDEPENDENT_AMBULATORY_CARE_PROVIDER_SITE_OTHER): Payer: PPO | Admitting: Internal Medicine

## 2018-01-08 VITALS — BP 110/80 | HR 99 | Temp 98.4°F | Ht 66.0 in | Wt 229.0 lb

## 2018-01-08 DIAGNOSIS — E6609 Other obesity due to excess calories: Secondary | ICD-10-CM

## 2018-01-08 DIAGNOSIS — Z0000 Encounter for general adult medical examination without abnormal findings: Secondary | ICD-10-CM

## 2018-01-08 NOTE — Progress Notes (Signed)
   Subjective:    Patient ID: Terri Mayer, female    DOB: 06/08/1951, 67 y.o.   MRN: [id number]  HPI Here for medicare wellness and physical, no new complaints. Please see A/P for status and treatment of chronic medical problems.   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 The patient's medical and social history - reviewed today no changes 2. Their use of alcohol, tobacco or illicit drugs 3. Their current medications and supplements 4. The patient's functional ability including ADL's, fall risks, home safety risks and hearing or visual impairment. 5. Diet and physical activities 6. Evidence for depression or mood disorders 7. Care team reviewed and updated (available in snapshot)  Review of Systems  Constitutional: Negative.   HENT: Negative.   Eyes: Negative.   Respiratory: Negative for cough, chest tightness and shortness of breath.   Cardiovascular: Negative for chest pain, palpitations and leg swelling.  Gastrointestinal: Negative for abdominal distention, abdominal pain, constipation, diarrhea, nausea and vomiting.  Musculoskeletal: Negative.   Skin: Negative.   Neurological: Negative.   Psychiatric/Behavioral: Negative.       Objective:   Physical Exam  Constitutional: She is oriented to person, place, and time. She appears well-developed and well-nourished.  HENT:  Head: Normocephalic and atraumatic.  Eyes: EOM are normal.  Neck: Normal range of motion.  Cardiovascular: Normal rate and regular rhythm.  Pulmonary/Chest: Effort normal and breath sounds normal. No  respiratory distress. She has no wheezes. She has no rales.  Abdominal: Soft. Bowel sounds are normal. She exhibits no distension. There is no tenderness. There is no rebound.  Musculoskeletal: She exhibits no edema.  Neurological: She is alert and oriented to person, place, and time. Coordination normal.  Skin: Skin is warm and dry.  Psychiatric: She has a normal mood and affect.   Vitals:   01/08/18 1302  BP: 110/80  Pulse: 99  Temp: 98.4 F (36.9 C)  TempSrc: Oral  SpO2: 96%  Weight: 229 lb (103.9 kg)  Height: 5\' 4"  (1.626 m)      Assessment & Plan:

## 2018-01-08 NOTE — Patient Instructions (Addendum)
We will check the labs today and call you back with the results.    Health Maintenance, Female Adopting a healthy lifestyle and getting preventive care can go a long way to promote health and wellness. Talk with your health care provider about what schedule of regular examinations is right for you. This is a good chance for you to check in with your provider about disease prevention and staying healthy. In between checkups, there are plenty of things you can do on your own. Experts have done a lot of research about which lifestyle changes and preventive measures are most likely to keep you healthy. Ask your health care provider for more information. Weight and diet Eat a healthy diet  Be sure to include plenty of vegetables, fruits, low-fat dairy products, and lean protein.  Do not eat a lot of foods high in solid fats, added sugars, or salt.  Get regular exercise. This is one of the most important things you can do for your health. ? Most adults should exercise for at least 150 minutes each week. The exercise should increase your heart rate and make you sweat (moderate-intensity exercise). ? Most adults should also do strengthening exercises at least twice a week. This is in addition to the moderate-intensity exercise.  Maintain a healthy weight  Body mass index (BMI) is a measurement that can be used to identify possible weight problems. It estimates body fat based on height and weight. Your health care provider can help determine your BMI and help you achieve or maintain a healthy weight.  For females 57 years of age and older: ? A BMI below 18.5 is considered underweight. ? A BMI of 18.5 to 24.9 is normal. ? A BMI of 25 to 29.9 is considered overweight. ? A BMI of 30 and above is considered obese.  Watch levels of cholesterol and blood lipids  You should start having your blood tested for lipids and cholesterol at 68 years of age, then have this test every 5 years.  You may need to  have your cholesterol levels checked more often if: ? Your lipid or cholesterol levels are high. ? You are older than 68 years of age. ? You are at high risk for heart disease.  Cancer screening Lung Cancer  Lung cancer screening is recommended for adults 44-9 years old who are at high risk for lung cancer because of a history of smoking.  A yearly low-dose CT scan of the lungs is recommended for people who: ? Currently smoke. ? Have quit within the past 15 years. ? Have at least a 30-pack-year history of smoking. A pack year is smoking an average of one pack of cigarettes a day for 1 year.  Yearly screening should continue until it has been 15 years since you quit.  Yearly screening should stop if you develop a health problem that would prevent you from having lung cancer treatment.  Breast Cancer  Practice breast self-awareness. This means understanding how your breasts normally appear and feel.  It also means doing regular breast self-exams. Let your health care provider know about any changes, no matter how small.  If you are in your 20s or 30s, you should have a clinical breast exam (CBE) by a health care provider every 1-3 years as part of a regular health exam.  If you are 68 or older, have a CBE every year. Also consider having a breast X-ray (mammogram) every year.  If you have a family history of breast cancer,  talk to your health care provider about genetic screening.  If you are at high risk for breast cancer, talk to your health care provider about having an MRI and a mammogram every year.  Breast cancer gene (BRCA) assessment is recommended for women who have family members with BRCA-related cancers. BRCA-related cancers include: ? Breast. ? Ovarian. ? Tubal. ? Peritoneal cancers.  Results of the assessment will determine the need for genetic counseling and BRCA1 and BRCA2 testing.  Cervical Cancer Your health care provider may recommend that you be screened  regularly for cancer of the pelvic organs (ovaries, uterus, and vagina). This screening involves a pelvic examination, including checking for microscopic changes to the surface of your cervix (Pap test). You may be encouraged to have this screening done every 3 years, beginning at age 27.  For women ages 48-65, health care providers may recommend pelvic exams and Pap testing every 3 years, or they may recommend the Pap and pelvic exam, combined with testing for human papilloma virus (HPV), every 5 years. Some types of HPV increase your risk of cervical cancer. Testing for HPV may also be done on women of any age with unclear Pap test results.  Other health care providers may not recommend any screening for nonpregnant women who are considered low risk for pelvic cancer and who do not have symptoms. Ask your health care provider if a screening pelvic exam is right for you.  If you have had past treatment for cervical cancer or a condition that could lead to cancer, you need Pap tests and screening for cancer for at least 20 years after your treatment. If Pap tests have been discontinued, your risk factors (such as having a new sexual partner) need to be reassessed to determine if screening should resume. Some women have medical problems that increase the chance of getting cervical cancer. In these cases, your health care provider may recommend more frequent screening and Pap tests.  Colorectal Cancer  This type of cancer can be detected and often prevented.  Routine colorectal cancer screening usually begins at 68 years of age and continues through 68 years of age.  Your health care provider may recommend screening at an earlier age if you have risk factors for colon cancer.  Your health care provider may also recommend using home test kits to check for hidden blood in the stool.  A small camera at the end of a tube can be used to examine your colon directly (sigmoidoscopy or colonoscopy). This is  done to check for the earliest forms of colorectal cancer.  Routine screening usually begins at age 74.  Direct examination of the colon should be repeated every 5-10 years through 68 years of age. However, you may need to be screened more often if early forms of precancerous polyps or small growths are found.  Skin Cancer  Check your skin from head to toe regularly.  Tell your health care provider about any new moles or changes in moles, especially if there is a change in a mole's shape or color.  Also tell your health care provider if you have a mole that is larger than the size of a pencil eraser.  Always use sunscreen. Apply sunscreen liberally and repeatedly throughout the day.  Protect yourself by wearing long sleeves, pants, a wide-brimmed hat, and sunglasses whenever you are outside.  Heart disease, diabetes, and high blood pressure  High blood pressure causes heart disease and increases the risk of stroke. High blood pressure is  more likely to develop in: ? People who have blood pressure in the high end of the normal range (130-139/85-89 mm Hg). ? People who are overweight or obese. ? People who are African American.  If you are 90-6 years of age, have your blood pressure checked every 3-5 years. If you are 13 years of age or older, have your blood pressure checked every year. You should have your blood pressure measured twice-once when you are at a hospital or clinic, and once when you are not at a hospital or clinic. Record the average of the two measurements. To check your blood pressure when you are not at a hospital or clinic, you can use: ? An automated blood pressure machine at a pharmacy. ? A home blood pressure monitor.  If you are between 68 years and 32 years old, ask your health care provider if you should take aspirin to prevent strokes.  Have regular diabetes screenings. This involves taking a blood sample to check your fasting blood sugar level. ? If you are  at a normal weight and have a low risk for diabetes, have this test once every three years after 68 years of age. ? If you are overweight and have a high risk for diabetes, consider being tested at a younger age or more often. Preventing infection Hepatitis B  If you have a higher risk for hepatitis B, you should be screened for this virus. You are considered at high risk for hepatitis B if: ? You were born in a country where hepatitis B is common. Ask your health care provider which countries are considered high risk. ? Your parents were born in a high-risk country, and you have not been immunized against hepatitis B (hepatitis B vaccine). ? You have HIV or AIDS. ? You use needles to inject street drugs. ? You live with someone who has hepatitis B. ? You have had sex with someone who has hepatitis B. ? You get hemodialysis treatment. ? You take certain medicines for conditions, including cancer, organ transplantation, and autoimmune conditions.  Hepatitis C  Blood testing is recommended for: ? Everyone born from [birth year] through 1966. ? Anyone with known risk factors for hepatitis C.  Sexually transmitted infections (STIs)  You should be screened for sexually transmitted infections (STIs) including gonorrhea and chlamydia if: ? You are sexually active and are younger than 68 years of age. ? You are older than 68 years of age and your health care provider tells you that you are at risk for this type of infection. ? Your sexual activity has changed since you were last screened and you are at an increased risk for chlamydia or gonorrhea. Ask your health care provider if you are at risk.  If you do not have HIV, but are at risk, it may be recommended that you take a prescription medicine daily to prevent HIV infection. This is called pre-exposure prophylaxis (PrEP). You are considered at risk if: ? You are sexually active and do not regularly use condoms or know the HIV status of your  partner(s). ? You take drugs by injection. ? You are sexually active with a partner who has HIV.  Talk with your health care provider about whether you are at high risk of being infected with HIV. If you choose to begin PrEP, you should first be tested for HIV. You should then be tested every 3 months for as long as you are taking PrEP. Pregnancy  If you are premenopausal and you  may become pregnant, ask your health care provider about preconception counseling.  If you may become pregnant, take 400 to 800 micrograms (mcg) of folic acid every day.  If you want to prevent pregnancy, talk to your health care provider about birth control (contraception). Osteoporosis and menopause  Osteoporosis is a disease in which the bones lose minerals and strength with aging. This can result in serious bone fractures. Your risk for osteoporosis can be identified using a bone density scan.  If you are 51 years of age or older, or if you are at risk for osteoporosis and fractures, ask your health care provider if you should be screened.  Ask your health care provider whether you should take a calcium or vitamin D supplement to lower your risk for osteoporosis.  Menopause may have certain physical symptoms and risks.  Hormone replacement therapy may reduce some of these symptoms and risks. Talk to your health care provider about whether hormone replacement therapy is right for you. Follow these instructions at home:  Schedule regular health, dental, and eye exams.  Stay current with your immunizations.  Do not use any tobacco products including cigarettes, chewing tobacco, or electronic cigarettes.  If you are pregnant, do not drink alcohol.  If you are breastfeeding, limit how much and how often you drink alcohol.  Limit alcohol intake to no more than 1 drink per day for nonpregnant women. One drink equals 12 ounces of beer, 5 ounces of wine, or 1 ounces of hard liquor.  Do not use street  drugs.  Do not share needles.  Ask your health care provider for help if you need support or information about quitting drugs.  Tell your health care provider if you often feel depressed.  Tell your health care provider if you have ever been abused or do not feel safe at home. This information is not intended to replace advice given to you by your health care provider. Make sure you discuss any questions you have with your health care provider. Document Released: 05/20/2011 Document Revised: 04/11/2016 Document Reviewed: 08/08/2015 Elsevier Interactive Patient Education  [employer].

## 2018-01-09 ENCOUNTER — Other Ambulatory Visit (INDEPENDENT_AMBULATORY_CARE_PROVIDER_SITE_OTHER): Payer: PPO

## 2018-01-09 ENCOUNTER — Encounter: Payer: Self-pay | Admitting: Internal Medicine

## 2018-01-09 DIAGNOSIS — Z0000 Encounter for general adult medical examination without abnormal findings: Secondary | ICD-10-CM

## 2018-01-09 LAB — CBC
HEMATOCRIT: 43.9 % (ref 36.0–46.0)
Hemoglobin: 14.6 g/dL (ref 12.0–15.0)
MCHC: 33.3 g/dL (ref 30.0–36.0)
MCV: 81.4 fl (ref 78.0–100.0)
PLATELETS: 282 10*3/uL (ref 150.0–400.0)
RBC: 5.4 Mil/uL — ABNORMAL HIGH (ref 3.87–5.11)
RDW: 14 % (ref 11.5–15.5)
WBC: 7.3 10*3/uL (ref 4.0–10.5)

## 2018-01-09 LAB — COMPREHENSIVE METABOLIC PANEL
ALT: 14 U/L (ref 0–35)
AST: 19 U/L (ref 0–37)
Albumin: 4.2 g/dL (ref 3.5–5.2)
Alkaline Phosphatase: 61 U/L (ref 39–117)
BUN: 21 mg/dL (ref 6–23)
CALCIUM: 9.9 mg/dL (ref 8.4–10.5)
CO2: 29 meq/L (ref 19–32)
CREATININE: 0.9 mg/dL (ref 0.40–1.20)
Chloride: 104 mEq/L (ref 96–112)
GFR: 66.28 mL/min (ref >60.00)
Glucose, Bld: 92 mg/dL (ref 70–99)
POTASSIUM: 4.7 meq/L (ref 3.5–5.1)
Sodium: 141 mEq/L (ref 135–145)
Total Bilirubin: 1.2 mg/dL (ref 0.2–1.2)
Total Protein: 7.2 g/dL (ref 6.0–8.3)

## 2018-01-09 LAB — LIPID PANEL
CHOL/HDL RATIO: 6
CHOLESTEROL: 266 mg/dL — AB (ref 0–200)
HDL: 48.1 mg/dL (ref >39.00)
LDL CALC: 192 mg/dL — AB (ref 0–99)
NonHDL: 217.84
TRIGLYCERIDES: 129 mg/dL (ref 0.0–149.0)
VLDL: 25.8 mg/dL (ref 0.0–40.0)

## 2018-01-09 LAB — TSH: TSH: 1.83 u[IU]/mL (ref 0.35–4.50)

## 2018-01-09 LAB — VITAMIN D 25 HYDROXY (VIT D DEFICIENCY, FRACTURES): VITD: 17.45 ng/mL — AB (ref 30.00–100.00)

## 2018-01-09 NOTE — Assessment & Plan Note (Signed)
Reminded about need for routine exercise and working on healthy diet.

## 2018-01-09 NOTE — Progress Notes (Signed)
Abstracted and sent to scan  

## 2018-01-09 NOTE — Assessment & Plan Note (Signed)
Colonoscopy up to date with [facility] 2017 with 10 year recommendation so due in 2027. Mammogram up to date. Declines pneumonia and tetanus and shingles today and would like to wait another year from her cancer treatment. Sees derm for body check and no suspicious lesions on exam. Counseled about exercise and sun safety. Given 10 year screening recommendations.

## 2018-01-10 ENCOUNTER — Encounter: Payer: Self-pay | Admitting: Oncology

## 2018-03-31 ENCOUNTER — Other Ambulatory Visit: Payer: Self-pay | Admitting: Oncology

## 2018-03-31 DIAGNOSIS — Z9889 Other specified postprocedural states: Secondary | ICD-10-CM

## 2018-04-17 ENCOUNTER — Telehealth: Payer: Self-pay | Admitting: Oncology

## 2018-04-17 NOTE — Telephone Encounter (Signed)
Called pt, left message. [facility] lab/GM appt -  lab 7/22 and GM 7/23.  Mailed calendar.

## 2018-05-13 ENCOUNTER — Ambulatory Visit
Admission: RE | Admit: 2018-05-13 | Discharge: 2018-05-13 | Disposition: A | Discharge status: Home / Self Care | Payer: PPO | Source: Ambulatory Visit | Attending: Oncology | Admitting: Oncology

## 2018-05-13 DIAGNOSIS — R928 Other abnormal and inconclusive findings on diagnostic imaging of breast: Secondary | ICD-10-CM | POA: Diagnosis not present

## 2018-05-13 DIAGNOSIS — Z853 Personal history of malignant neoplasm of breast: Secondary | ICD-10-CM | POA: Diagnosis not present

## 2018-05-13 DIAGNOSIS — Z9889 Other specified postprocedural states: Secondary | ICD-10-CM

## 2018-05-13 HISTORY — DX: Personal history of antineoplastic chemotherapy: Z92.21

## 2018-05-25 ENCOUNTER — Other Ambulatory Visit: Payer: PPO

## 2018-05-27 ENCOUNTER — Ambulatory Visit: Payer: PPO | Admitting: Oncology

## 2018-06-08 ENCOUNTER — Inpatient Hospital Stay: Payer: PPO | Attending: Oncology

## 2018-06-08 DIAGNOSIS — Z801 Family history of malignant neoplasm of trachea, bronchus and lung: Secondary | ICD-10-CM | POA: Insufficient documentation

## 2018-06-08 DIAGNOSIS — B351 Tinea unguium: Secondary | ICD-10-CM | POA: Insufficient documentation

## 2018-06-08 DIAGNOSIS — Z923 Personal history of irradiation: Secondary | ICD-10-CM | POA: Insufficient documentation

## 2018-06-08 DIAGNOSIS — Z79899 Other long term (current) drug therapy: Secondary | ICD-10-CM | POA: Insufficient documentation

## 2018-06-08 DIAGNOSIS — Z9221 Personal history of antineoplastic chemotherapy: Secondary | ICD-10-CM | POA: Insufficient documentation

## 2018-06-08 DIAGNOSIS — M1990 Unspecified osteoarthritis, unspecified site: Secondary | ICD-10-CM | POA: Diagnosis not present

## 2018-06-08 DIAGNOSIS — Z853 Personal history of malignant neoplasm of breast: Secondary | ICD-10-CM | POA: Diagnosis not present

## 2018-06-08 DIAGNOSIS — Z9011 Acquired absence of right breast and nipple: Secondary | ICD-10-CM | POA: Diagnosis not present

## 2018-06-08 DIAGNOSIS — Z171 Estrogen receptor negative status [ER-]: Secondary | ICD-10-CM

## 2018-06-08 DIAGNOSIS — C50211 Malignant neoplasm of upper-inner quadrant of right female breast: Secondary | ICD-10-CM

## 2018-06-08 LAB — CBC WITH DIFFERENTIAL/PLATELET
Basophils Absolute: 0 10*3/uL (ref 0.0–0.1)
Basophils Relative: 1 %
EOS PCT: 3 %
Eosinophils Absolute: 0.2 10*3/uL (ref 0.0–0.5)
HCT: 43.1 % (ref 34.8–46.6)
Hemoglobin: 13.9 g/dL (ref 11.6–15.9)
LYMPHS ABS: 1.5 10*3/uL (ref 0.9–3.3)
LYMPHS PCT: 21 %
MCH: 26.8 pg (ref 25.1–34.0)
MCHC: 32.3 g/dL (ref 31.5–36.0)
MCV: 83 fL (ref 79.5–101.0)
MONO ABS: 0.8 10*3/uL (ref 0.1–0.9)
MONOS PCT: 11 %
Neutro Abs: 4.5 10*3/uL (ref 1.5–6.5)
Neutrophils Relative %: 64 %
Platelets: 254 10*3/uL (ref 145–400)
RBC: 5.19 MIL/uL (ref 3.70–5.45)
RDW: 14.1 % (ref 11.2–14.5)
WBC: 7 10*3/uL (ref 3.9–10.3)

## 2018-06-08 LAB — COMPREHENSIVE METABOLIC PANEL
ALBUMIN: 4 g/dL (ref 3.5–5.0)
ALT: 15 U/L (ref 0–44)
AST: 19 U/L (ref 15–41)
Alkaline Phosphatase: 72 U/L (ref 38–126)
Anion gap: 7 (ref 5–15)
BUN: 27 mg/dL — AB (ref 8–23)
CHLORIDE: 104 mmol/L (ref 98–111)
CO2: 29 mmol/L (ref 22–32)
CREATININE: 1.05 mg/dL — AB (ref 0.44–1.00)
Calcium: 9.9 mg/dL (ref 8.9–10.3)
GFR calc Af Amer: >60 mL/min (ref >60)
GFR calc non Af Amer: 54 mL/min — ABNORMAL LOW (ref >60)
GLUCOSE: 90 mg/dL (ref 70–99)
POTASSIUM: 4.9 mmol/L (ref 3.5–5.1)
Sodium: 140 mmol/L (ref 135–145)
Total Bilirubin: 1.1 mg/dL (ref 0.3–1.2)
Total Protein: 7.2 g/dL (ref 6.5–8.1)

## 2018-06-08 NOTE — Progress Notes (Signed)
[place]  [phone]) [phone] [phone]) [phone]   ID: Terri Mayer DOB: [birth year]  MR#: [id number]  GDJ#:[id number]  Patient Care Team: [name], MD as PCP - General (Internal Medicine) Gera Inboden, Virgie Dad, MD as Consulting Physician (Oncology) Rolm Bookbinder, MD as Consulting Physician (General Surgery) Princess Bruins, MD as Consulting Physician (Obstetrics and Gynecology) Haroldine Laws, Shaune Pascal, MD as Consulting Physician (Cardiology) Laurence Spates, MD as Consulting Physician (Gastroenterology) PCP: [name], MD OTHER MD:  CHIEF COMPLAINT: right breast cancer  CURRENT TREATMENT: Observation  BREAST CANCER HISTORY: From the original intake note:  "Terri Mayer" underwent bilateral screening mammography with tomosynthesis at the breast center 11/02/2015 showing a possible mass in the right breast and low right axillary area. She was called back for right diagnostic mammography with tomosynthesis and right ultrasonography 11/14/2015. The breast density was category B. There was a lobulated mass in the lateral right breast, with a smaller oval mass immediately adjacent to it. Also in the upper outer quadrant there was a third mass. This has been stable back to 2014 and is felt to be a fibroadenoma.  Ultrasound of the right breast confirmed a hypoechoic mass at the 3:00 position 6 cm from the nipple, measuring 1.4 cm. Adjacent to this there was a 0.5 cm internal mammary lymph node, with normal morphology and normal hilar fat. The mass at the 10:00 position measured 0.8 cm and again was consistent with a fibroadenoma. In the right axilla there was an abnormal lymph node with no internal fat measuring 1.8 cm. The rest of the exam was unremarkable.  Biopsy of the right breast 8:30 o'clock lesion and the right axillary lymph node on 11/16/2015 showed ([id number]) both to be positive for invasive ductal carcinoma, grade 3, the breast mass being  estrogen receptor 10% positive with moderate staining intensity, but progesterone receptor negative, but the lymph node being estrogen and progesterone both negative. Both the breast and lymph nodes had MIB-1 of 30%. Both were positive for HER-2 amplification, the signals ratio is being 6.06 and 7.61, and the number per cell 10.00 and 11.80.  The patient's subsequent history is as detailed below   INTERVAL HISTORY: Terri Mayer returns today for follow-up of her HER-2/neu positive breast cancer. She never started taking anastrozole.  She is now feeling much more normal.  She has more energy.  She has been doing a lot of gardening.  She is concerned that she may be missing some risk reduction, but is also not eager to start that medication.  Since her last visit, she underwent diagnostic bilateral mammography with CAD and tomography on 05/13/2018 at [facility] showing: breast density category B. There was no evidence of malignancy.    REVIEW OF SYSTEMS: Terri Mayer is concerned that she has significant fungal infection in her big toes.  She has been trying a variety of local therapies for this without success.  She has not been having unusual headaches, nausea, vomiting, cough, phlegm production, pleurisy, or other issues.  She is not exercising regularly.  A detailed review of systems today was otherwise stable.   PAST MEDICAL HISTORY: Past Medical History:  Diagnosis Date  . Allergy   . Breast cancer ([place])   . History of chicken pox   . Osteoarthritis    Neck,Back,Knees,Hips,Ankles  . Personal history of chemotherapy   . Personal history of radiation therapy     PAST SURGICAL HISTORY: Past Surgical History:  Procedure Laterality Date  . BREAST LUMPECTOMY Right 2017  .  DILATION AND CURETTAGE OF UTERUS    . EYE SURGERY  at age 24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  . PORTACATH PLACEMENT Right 12/11/2015   Procedure: INSERTION PORT-A-CATH WITH ULTRASOUND;  Surgeon: Rolm Bookbinder, MD;  Location: [facility];  Service: General;  Laterality: Right;  . RADIOACTIVE SEED GUIDED PARTIAL MASTECTOMY/AXILLARY SENTINEL NODE BIOPSY/AXILLARY NODE DISSECTION Right 05/02/2016   Procedure: RIGHT BREAST SEED GUIDED LUMPECTOMY AND RIGHT SEED TARGETED AXILLARY DISSECTION,AND SENTINEL NODE BIOPSY;  Surgeon: Rolm Bookbinder, MD;  Location: [facility];  Service: General;  Laterality: Right;  RIGHT BREAST SEED GUIDED LUMPECTOMY AND RIGHT SEED TARGETED AXILLARY DISSECTION,AND SENTINEL NODE BIOPSY  . WISDOM TOOTH EXTRACTION      FAMILY HISTORY Family History  Problem Relation Age of Onset  . Heart disease Father   . Diabetes Unknown        GM  . Dementia Unknown        M  . Transient ischemic attack Unknown        F  . Colon cancer Neg Hx   . Breast cancer Neg Hx    the patient's father died after multiple strokes at the age of 52. The patient's mother died at the age of 75 with Alzheimer's disease. The patient had one brother, one sister. The patient's paternal grandfather died from lung cancer in his 43s. On the mother's side there are 2 (second) cousins with breast cancer diagnosed in their early 24s, and a more remote cousin diagnosed with breast cancer at age 29. There is no history of ovarian cancer.   GYNECOLOGIC HISTORY:  No LMP recorded. Patient is postmenopausal.  menarche age 72, the patient is GX P0 and she understands that not carrying a child to term before the age of 38 essentially doubled the risk of breast cancer. She stopped having periods between age 39 and 47 and did not take hormone replacement. 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 She lives by herself, with her pet dog. Her son, adopted at birth, Terri Mayer, Graduates from [employer] in the psychology Department May 2018.     ADVANCED DIRECTIVES: Terri Mayer tells me as of  March 2018 her sister Terri Mayer holiday in [place] is still her healthcare power of attorney. She can be reached at [phone]. However she is intending to name her son Terri Mayer as her healthcare part of attorney. He can be reached at [phone]. In the second slot she will name her sister-in-law [name] who is vice president of a hospital, to assist her son. Terri Mayer can be reached at [phone]. The patient's sister will no longer be her healthcare part of attorney.    HEALTH MAINTENANCE: Social History   Tobacco Use  . Smoking status: Never Smoker  . Smokeless tobacco: Never Used  Substance Use Topics  . Alcohol use: Yes    Comment: rarely   . Drug use: Not on file     Colonoscopy: 12/28/2004  PAP:  Bone density:  Lipid panel:  Allergies  Allergen Reactions  . Codeine Nausea And Vomiting    Current Outpatient Medications  Medication Sig Dispense Refill  . clobetasol ointment (TEMOVATE) 6.78 % Apply 1 application topically 2 (two) times a week. 30 g 4  . cyclobenzaprine (FLEXERIL) 10 MG tablet Take 10 mg by mouth 3 (three) times daily as needed  for muscle spasms.    Marland Kitchen ibuprofen (ADVIL,MOTRIN) 200 MG tablet Take 200 mg by mouth every 6 (six) hours as needed for fever or mild pain. Reported on 03/22/2016    . Multiple Vitamin (MULTIVITAMIN) tablet Take 1 tablet by mouth daily.    Marland Kitchen terbinafine (LAMISIL) 250 MG tablet Take 1 tablet (250 mg total) by mouth daily. 60 tablet 4   No current facility-administered medications for this visit.     OBJECTIVE: Middle-aged white woman who appears stated age  67:   06/09/18 1158  BP: (!) 121/50  Pulse: 77  Resp: 18  Temp: 97.9 F (36.6 C)  SpO2: 97%     Body mass index is 37.83 kg/m.    ECOG FS:0 - Asymptomatic  Sclerae unicteric, EOMs intact, conjunctival hemorrhage right eye medially Oropharynx clear and moist No cervical or supraclavicular adenopathy Lungs no rales or rhonchi Heart regular rate and rhythm Abd soft,  nontender, positive bowel sounds MSK no focal spinal tenderness, no upper extremity lymphedema Neuro: nonfocal, well oriented, appropriate affect Breasts: The right breast is status post lumpectomy and radiation.  There is no evidence of local recurrence.  The left breast is benign.  Both axillae are benign. Skin: Bilateral onychomycosis, imaged below  Toenails 06/09/2018    LAB RESULTS:  CMP     Component Value Date/Time   NA 140 06/08/2018 1144   NA 142 02/13/2017 1136   K 4.9 06/08/2018 1144   K 4.8 02/13/2017 1136   CL 104 06/08/2018 1144   CO2 29 06/08/2018 1144   CO2 34 (H) 02/13/2017 1136   GLUCOSE 90 06/08/2018 1144   GLUCOSE 92 02/13/2017 1136   BUN 27 (H) 06/08/2018 1144   BUN 26.9 (H) 02/13/2017 1136   CREATININE 1.05 (H) 06/08/2018 1144   CREATININE 1.0 02/13/2017 1136   CALCIUM 9.9 06/08/2018 1144   CALCIUM 10.5 (H) 02/13/2017 1136   PROT 7.2 06/08/2018 1144   PROT 6.9 02/13/2017 1136   ALBUMIN 4.0 06/08/2018 1144   ALBUMIN 3.9 02/13/2017 1136   AST 19 06/08/2018 1144   AST 21 02/13/2017 1136   ALT 15 06/08/2018 1144   ALT 15 02/13/2017 1136   ALKPHOS 72 06/08/2018 1144   ALKPHOS 69 02/13/2017 1136   BILITOT 1.1 06/08/2018 1144   BILITOT 1.28 (H) 02/13/2017 1136   GFRNONAA 54 (L) 06/08/2018 1144   GFRAA >60 06/08/2018 1144    INo results found for: SPEP, UPEP  Lab Results  Component Value Date   WBC 7.0 06/08/2018   NEUTROABS 4.5 06/08/2018   HGB 13.9 06/08/2018   HCT 43.1 06/08/2018   MCV 83.0 06/08/2018   PLT 254 06/08/2018      Chemistry      Component Value Date/Time   NA 140 06/08/2018 1144   NA 142 02/13/2017 1136   K 4.9 06/08/2018 1144   K 4.8 02/13/2017 1136   CL 104 06/08/2018 1144   CO2 29 06/08/2018 1144   CO2 34 (H) 02/13/2017 1136   BUN 27 (H) 06/08/2018 1144   BUN 26.9 (H) 02/13/2017 1136   CREATININE 1.05 (H) 06/08/2018 1144   CREATININE 1.0 02/13/2017 1136      Component Value Date/Time   CALCIUM 9.9 06/08/2018  1144   CALCIUM 10.5 (H) 02/13/2017 1136   ALKPHOS 72 06/08/2018 1144   ALKPHOS 69 02/13/2017 1136   AST 19 06/08/2018 1144   AST 21 02/13/2017 1136   ALT 15 06/08/2018 1144   ALT 15 02/13/2017 1136  BILITOT 1.1 06/08/2018 1144   BILITOT 1.28 (H) 02/13/2017 1136       No results found for: LABCA2  No components found for: EYCXK481  No results for input(s): INR in the last 168 hours.   STUDIES:  Since her last visit, she underwent diagnostic bilateral mammography with CAD and tomography on 05/13/2018 at [facility] showing: breast density category B. There was no evidence of malignancy.  ASSESSMENT: 67 y.o. [place] woman status post right breast lower outer quadrant and right axillary lymph node biopsy 11/16/2015, also positive for a clinical  T1c pN1, stage IIA invasive ductal carcinoma grade 3, essentially estrogen and progesterone receptor negative but HER-2 amplified, with an MIB-1 of 30%  (a) the breast lesion had moderate positivity for the estrogen receptor at 10%; the axillary lymph node lesion was estrogen receptor negative. Both were progesterone receptor negative  (1) neoadjuvant chemotherapy with carboplatin, docetaxel, trastuzumab and pertuzumab for 6 cycles, started 12/18/2015  (a) docetaxel switched to gemcitabine for cycles 4-6 because of progressive neuropathy  (b) pertuzumab dose cut in half with cycle 4 due to excessive diarrhea, resumed full dose cycle 5  (2) trastuzumab continued to total one year, last dose 12/11/2016  (a) baseline echocardiogram on 12/12/15 showed an EF of 60-65%  (b) echocardiogram 04/19/2016 showed an ejection fraction in the 50-55% range  (c) final echocardiogram 10/22/2016 shows an ejection fraction in the 55-60% range. He  (3) right lumpectomy and sentinel lymph node sampling 05/02/2016 showed a complete pathologic response (ypT0, ypN0)  (4) adjuvant radiation 06/17/3016-07/12/2016 1. Right breast boost// 7.5 Gy with 3 fractions  at a dose of 2.5 Gy/ fraction 2. Right breast// 42.5 Gy with 17 fractions at a dose of 2.5 Gy/ fraction  (5) anastrozole prescribed May 2018 but never started by the patient  (a) initial breast biopsy showed 10% estrogen positivity with moderate staining intensity  (6) radiation recall rash 03/06/2017   PLAN: Terri Mayer is now 2 years out from definitive surgery for breast cancer with no evidence of disease recurrence.  This is very favorable.  She is toying with the idea of starting anastrozole.  It can certainly be started late and still provide significant benefit.  However the benefits in her case are likely to be minimal because of her compromised estrogen status (only 10% in the breast, estrogen receptor negative in the armpit).  What is really bothering it is her toenails.  We are going to try toremifene.  I gave her information on the drug in writing and also cautioned her on the possible side effects toxicities and complications.  I went ahead and wrote her the prescription.  She will have lab works in 6 weeks and a visit in 12 weeks.  If it is not clear then we will try an additional 12 weeks assuming she tolerates it well.  We will check a vitamin D level with the next set of labs  She knows to call for any problems that may develop before the next visit.  Dent Plantz, Virgie Dad, MD  06/09/18 12:18 PM Medical Oncology and Hematology [facility] [address] [place], [place] [address] Tel. [phone]    Fax. [phone]  Alice Rieger, am acting as scribe for Chauncey Cruel MD.  I, Lurline Del MD, have reviewed the above documentation for accuracy and completeness, and I agree with the above.

## 2018-06-09 ENCOUNTER — Telehealth: Payer: Self-pay | Admitting: Oncology

## 2018-06-09 ENCOUNTER — Inpatient Hospital Stay (HOSPITAL_BASED_OUTPATIENT_CLINIC_OR_DEPARTMENT_OTHER): Payer: PPO | Admitting: Oncology

## 2018-06-09 VITALS — BP 121/50 | HR 77 | Temp 97.9°F | Resp 18 | Ht 66.0 in | Wt 234.4 lb

## 2018-06-09 DIAGNOSIS — C50211 Malignant neoplasm of upper-inner quadrant of right female breast: Secondary | ICD-10-CM

## 2018-06-09 DIAGNOSIS — Z79899 Other long term (current) drug therapy: Secondary | ICD-10-CM | POA: Diagnosis not present

## 2018-06-09 DIAGNOSIS — Z9221 Personal history of antineoplastic chemotherapy: Secondary | ICD-10-CM

## 2018-06-09 DIAGNOSIS — M1990 Unspecified osteoarthritis, unspecified site: Secondary | ICD-10-CM

## 2018-06-09 DIAGNOSIS — Z923 Personal history of irradiation: Secondary | ICD-10-CM | POA: Diagnosis not present

## 2018-06-09 DIAGNOSIS — Z801 Family history of malignant neoplasm of trachea, bronchus and lung: Secondary | ICD-10-CM | POA: Diagnosis not present

## 2018-06-09 DIAGNOSIS — Z9011 Acquired absence of right breast and nipple: Secondary | ICD-10-CM | POA: Diagnosis not present

## 2018-06-09 DIAGNOSIS — B351 Tinea unguium: Secondary | ICD-10-CM | POA: Diagnosis not present

## 2018-06-09 DIAGNOSIS — Z853 Personal history of malignant neoplasm of breast: Secondary | ICD-10-CM | POA: Diagnosis not present

## 2018-06-09 DIAGNOSIS — Z171 Estrogen receptor negative status [ER-]: Principal | ICD-10-CM

## 2018-06-09 MED ORDER — TERBINAFINE HCL 250 MG PO TABS: 250.0000 mg | ORAL_TABLET | Freq: Every day | ORAL | 4 refills | Status: DC

## 2018-06-09 NOTE — Telephone Encounter (Signed)
Gave patient AVS and calendar.  °

## 2018-06-18 ENCOUNTER — Ambulatory Visit: Payer: PPO | Admitting: Obstetrics & Gynecology

## 2018-06-18 ENCOUNTER — Encounter: Payer: Self-pay | Admitting: Obstetrics & Gynecology

## 2018-06-18 VITALS — BP 122/84 | Ht 65.5 in | Wt 235.0 lb

## 2018-06-18 DIAGNOSIS — Z124 Encounter for screening for malignant neoplasm of cervix: Secondary | ICD-10-CM

## 2018-06-18 DIAGNOSIS — Z9189 Other specified personal risk factors, not elsewhere classified: Secondary | ICD-10-CM | POA: Diagnosis not present

## 2018-06-18 DIAGNOSIS — Z171 Estrogen receptor negative status [ER-]: Secondary | ICD-10-CM

## 2018-06-18 DIAGNOSIS — Z780 Asymptomatic menopausal state: Secondary | ICD-10-CM

## 2018-06-18 DIAGNOSIS — M85851 Other specified disorders of bone density and structure, right thigh: Secondary | ICD-10-CM

## 2018-06-18 DIAGNOSIS — Z01419 Encounter for gynecological examination (general) (routine) without abnormal findings: Secondary | ICD-10-CM | POA: Diagnosis not present

## 2018-06-18 DIAGNOSIS — C50211 Malignant neoplasm of upper-inner quadrant of right female breast: Secondary | ICD-10-CM

## 2018-06-18 NOTE — Progress Notes (Signed)
Terri Mayer 1951/08/31 [id number]   History:    67 y.o. G0 Divorced. Retired Pharmacist, community.  Adopted son 88 yo.  RP:  Established patient presenting for annual gyn exam   HPI: Rt breast Ca followed by Dr Jana Hakim: Rt breast partial mastectomy/LN on 11/16/2015 were both positive for invasive ductal carcinoma, grade 3, the breast mass being ER 10% positive, PR negative, LN ER and PR negative.  Radiation therapy.  She never started taking anastrozole.  Diagnostic bilateral mammography with CAD and tomography on 05/13/2018 at [facility] showing: breast density category B. There was no evidence of malignancy.   Menopause, well on no HRT.  No PMB.  No pelvic pain.  Abstinent.  Last Pap negative 05/2016.  Mild Osteopenia T-Score at -1.2 in 04/2017.  BMI 38.51.     Past medical history,surgical history, family history and social history were all reviewed and documented in the EPIC chart.  Gynecologic History No LMP recorded. Patient is postmenopausal. Contraception: abstinence and post menopausal status Last Pap: 05/2016. Results were: Negative Last mammogram: 04/2018. Results were: Benign Bone Density: 04/2017 Osteopenia Rt Femur Neck T-Score -1.2 Colonoscopy: 10/2016  Obstetric History OB History  Gravida Para Term Preterm AB Living  0 0 0 0 0 0  SAB TAB Ectopic Multiple Live Births  0 0 0 0 0     ROS: A ROS was performed and pertinent positives and negatives are included in the history.  GENERAL: No fevers or chills. HEENT: No change in vision, no earache, sore throat or sinus congestion. NECK: No pain or stiffness. CARDIOVASCULAR: No chest pain or pressure. No palpitations. PULMONARY: No shortness of breath, cough or wheeze. GASTROINTESTINAL: No abdominal pain, nausea, vomiting or diarrhea, melena or bright red blood per rectum. GENITOURINARY: No urinary frequency, urgency, hesitancy or dysuria. MUSCULOSKELETAL: No joint or muscle pain, no back pain, no recent trauma.  DERMATOLOGIC: No rash, no itching, no lesions. ENDOCRINE: No polyuria, polydipsia, no heat or cold intolerance. No recent change in weight. HEMATOLOGICAL: No anemia or easy bruising or bleeding. NEUROLOGIC: No headache, seizures, numbness, tingling or weakness. PSYCHIATRIC: No depression, no loss of interest in normal activity or change in sleep pattern.     Exam:   BP 122/84   Ht 5' 5.5" (1.664 m)   Wt 235 lb (106.6 kg)   BMI 38.51 kg/m   Body mass index is 38.51 kg/m.  General appearance : Well developed well nourished female. No acute distress HEENT: Eyes: no retinal hemorrhage or exudates,  Neck supple, trachea midline, no carotid bruits, no thyroidmegaly Lungs: Clear to auscultation, no rhonchi or wheezes, or rib retractions  Heart: Regular rate and rhythm, no murmurs or gallops Breast:Examined in sitting and supine position were symmetrical in appearance, no palpable masses or tenderness,  no skin retraction, no nipple inversion, no nipple discharge, no skin discoloration, no axillary or supraclavicular lymphadenopathy Abdomen: no palpable masses or tenderness, no rebound or guarding Extremities: no edema or skin discoloration or tenderness  Pelvic: Vulva: Normal             Vagina: No gross lesions or discharge  Cervix: No gross lesions or discharge.  Pap reflex done.  Uterus  AV, normal size, shape and consistency, non-tender and mobile  Adnexa  Without masses or tenderness  Anus: Normal   Assessment/Plan:  67 y.o. female for annual exam   1. Encounter for routine gynecological examination with Papanicolaou smear of cervix Normal gynecologic exam.  Pap reflex done.  Breast exam normal, status post lumpectomy and radiation on the right.  Colonoscopy in December 2017.  Health labs with Dr. Jana Hakim.  2. Menopause present Well on no hormone replacement therapy.  No postmenopausal bleeding.  3. Malignant neoplasm of upper-inner quadrant of right breast in female, estrogen  receptor negative ([place]) Followed by Dr. Jana Hakim.  Bilateral diagnostic mammogram benign in June 2019.  4. Osteopenia of neck of right femur Vitamin D supplements, calcium rich nutrition and regular weightbearing physical activity recommended.  Patient enjoys gardening.  5. Screening for malignant neoplasm of cervix - Pap IG w/ reflex to HPV when ASC-U  Princess Bruins MD, 2:44 PM 06/18/2018

## 2018-06-19 ENCOUNTER — Encounter: Payer: Self-pay | Admitting: *Deleted

## 2018-06-19 ENCOUNTER — Encounter: Payer: Self-pay | Admitting: Obstetrics & Gynecology

## 2018-06-19 LAB — PAP IG W/ RFLX HPV ASCU

## 2018-06-19 NOTE — Patient Instructions (Signed)
1. Encounter for routine gynecological examination with Papanicolaou smear of cervix Normal gynecologic exam.  Pap reflex done.  Breast exam normal, status post lumpectomy and radiation on the right.  Colonoscopy in December 2017.  Health labs with Dr. Jana Hakim.  2. Menopause present Well on no hormone replacement therapy.  No postmenopausal bleeding.  3. Malignant neoplasm of upper-inner quadrant of right breast in female, estrogen receptor negative ([place]) Followed by Dr. Jana Hakim.  Bilateral diagnostic mammogram benign in June 2019.  4. Osteopenia of neck of right femur Vitamin D supplements, calcium rich nutrition and regular weightbearing physical activity recommended.  Patient enjoys gardening.  5. Screening for malignant neoplasm of cervix - Pap IG w/ reflex to HPV when ASC-U  [name], it was a pleasure seeing you today!  I will inform you of your results as soon as they are available.

## 2018-07-21 ENCOUNTER — Inpatient Hospital Stay: Payer: PPO | Attending: Oncology

## 2018-07-21 DIAGNOSIS — Z853 Personal history of malignant neoplasm of breast: Secondary | ICD-10-CM | POA: Diagnosis not present

## 2018-07-21 DIAGNOSIS — C50211 Malignant neoplasm of upper-inner quadrant of right female breast: Secondary | ICD-10-CM

## 2018-07-21 DIAGNOSIS — Z171 Estrogen receptor negative status [ER-]: Secondary | ICD-10-CM

## 2018-07-21 LAB — CBC WITH DIFFERENTIAL/PLATELET
Basophils Absolute: 0 10*3/uL (ref 0.0–0.1)
Basophils Relative: 1 %
Eosinophils Absolute: 0.2 10*3/uL (ref 0.0–0.5)
Eosinophils Relative: 3 %
HCT: 40.5 % (ref 34.8–46.6)
Hemoglobin: 13.2 g/dL (ref 11.6–15.9)
LYMPHS PCT: 21 %
Lymphs Abs: 1.2 10*3/uL (ref 0.9–3.3)
MCH: 26.9 pg (ref 25.1–34.0)
MCHC: 32.6 g/dL (ref 31.5–36.0)
MCV: 82.5 fL (ref 79.5–101.0)
MONO ABS: 0.6 10*3/uL (ref 0.1–0.9)
MONOS PCT: 10 %
Neutro Abs: 4 10*3/uL (ref 1.5–6.5)
Neutrophils Relative %: 65 %
Platelets: 238 10*3/uL (ref 145–400)
RBC: 4.91 MIL/uL (ref 3.70–5.45)
RDW: 14.2 % (ref 11.2–14.5)
WBC: 6 10*3/uL (ref 3.9–10.3)

## 2018-07-21 LAB — COMPREHENSIVE METABOLIC PANEL
ALBUMIN: 3.9 g/dL (ref 3.5–5.0)
ALK PHOS: 67 U/L (ref 38–126)
ALT: 14 U/L (ref 0–44)
AST: 21 U/L (ref 15–41)
Anion gap: 10 (ref 5–15)
BUN: 23 mg/dL (ref 8–23)
CALCIUM: 9.9 mg/dL (ref 8.9–10.3)
CO2: 29 mmol/L (ref 22–32)
Chloride: 104 mmol/L (ref 98–111)
Creatinine, Ser: 0.83 mg/dL (ref 0.44–1.00)
GFR calc Af Amer: >60 mL/min (ref >60)
GFR calc non Af Amer: >60 mL/min (ref >60)
GLUCOSE: 90 mg/dL (ref 70–99)
Potassium: 4.3 mmol/L (ref 3.5–5.1)
SODIUM: 143 mmol/L (ref 135–145)
Total Bilirubin: 1 mg/dL (ref 0.3–1.2)
Total Protein: 7 g/dL (ref 6.5–8.1)

## 2018-07-22 ENCOUNTER — Encounter: Payer: Self-pay | Admitting: Oncology

## 2018-07-22 LAB — VITAMIN D 25 HYDROXY (VIT D DEFICIENCY, FRACTURES): VIT D 25 HYDROXY: 24.6 ng/mL — AB (ref 30.0–100.0)

## 2018-09-01 ENCOUNTER — Inpatient Hospital Stay (HOSPITAL_BASED_OUTPATIENT_CLINIC_OR_DEPARTMENT_OTHER): Payer: PPO | Admitting: Adult Health

## 2018-09-01 ENCOUNTER — Encounter: Payer: Self-pay | Admitting: Adult Health

## 2018-09-01 ENCOUNTER — Inpatient Hospital Stay: Payer: PPO | Attending: Oncology

## 2018-09-01 ENCOUNTER — Telehealth: Payer: Self-pay | Admitting: Oncology

## 2018-09-01 VITALS — BP 127/63 | HR 78 | Temp 98.2°F | Resp 18 | Ht 65.5 in | Wt 235.8 lb

## 2018-09-01 DIAGNOSIS — Z803 Family history of malignant neoplasm of breast: Secondary | ICD-10-CM

## 2018-09-01 DIAGNOSIS — Z853 Personal history of malignant neoplasm of breast: Secondary | ICD-10-CM | POA: Diagnosis not present

## 2018-09-01 DIAGNOSIS — Z923 Personal history of irradiation: Secondary | ICD-10-CM | POA: Diagnosis not present

## 2018-09-01 DIAGNOSIS — M1990 Unspecified osteoarthritis, unspecified site: Secondary | ICD-10-CM | POA: Diagnosis not present

## 2018-09-01 DIAGNOSIS — Z9221 Personal history of antineoplastic chemotherapy: Secondary | ICD-10-CM

## 2018-09-01 DIAGNOSIS — B351 Tinea unguium: Secondary | ICD-10-CM

## 2018-09-01 DIAGNOSIS — Z801 Family history of malignant neoplasm of trachea, bronchus and lung: Secondary | ICD-10-CM

## 2018-09-01 DIAGNOSIS — Z79899 Other long term (current) drug therapy: Secondary | ICD-10-CM | POA: Insufficient documentation

## 2018-09-01 DIAGNOSIS — E559 Vitamin D deficiency, unspecified: Secondary | ICD-10-CM | POA: Diagnosis not present

## 2018-09-01 DIAGNOSIS — Z171 Estrogen receptor negative status [ER-]: Secondary | ICD-10-CM

## 2018-09-01 DIAGNOSIS — C50211 Malignant neoplasm of upper-inner quadrant of right female breast: Secondary | ICD-10-CM

## 2018-09-01 LAB — COMPREHENSIVE METABOLIC PANEL
ALK PHOS: 67 U/L (ref 38–126)
ALT: 15 U/L (ref 0–44)
ANION GAP: 7 (ref 5–15)
AST: 22 U/L (ref 15–41)
Albumin: 3.9 g/dL (ref 3.5–5.0)
BUN: 20 mg/dL (ref 8–23)
CALCIUM: 9.9 mg/dL (ref 8.9–10.3)
CHLORIDE: 104 mmol/L (ref 98–111)
CO2: 30 mmol/L (ref 22–32)
Creatinine, Ser: 0.82 mg/dL (ref 0.44–1.00)
Glucose, Bld: 90 mg/dL (ref 70–99)
Potassium: 4.8 mmol/L (ref 3.5–5.1)
SODIUM: 141 mmol/L (ref 135–145)
Total Bilirubin: 0.7 mg/dL (ref 0.3–1.2)
Total Protein: 7 g/dL (ref 6.5–8.1)

## 2018-09-01 LAB — CBC WITH DIFFERENTIAL/PLATELET
Abs Immature Granulocytes: 0.01 10*3/uL (ref 0.00–0.07)
BASOS PCT: 1 %
Basophils Absolute: 0 10*3/uL (ref 0.0–0.1)
EOS PCT: 4 %
Eosinophils Absolute: 0.3 10*3/uL (ref 0.0–0.5)
HCT: 42.4 % (ref 36.0–46.0)
Hemoglobin: 13.7 g/dL (ref 12.0–15.0)
Immature Granulocytes: 0 %
Lymphocytes Relative: 22 %
Lymphs Abs: 1.4 10*3/uL (ref 0.7–4.0)
MCH: 26.7 pg (ref 26.0–34.0)
MCHC: 32.3 g/dL (ref 30.0–36.0)
MCV: 82.5 fL (ref 80.0–100.0)
MONO ABS: 0.8 10*3/uL (ref 0.1–1.0)
Monocytes Relative: 13 %
Neutro Abs: 3.9 10*3/uL (ref 1.7–7.7)
Neutrophils Relative %: 60 %
PLATELETS: 268 10*3/uL (ref 150–400)
RBC: 5.14 MIL/uL — AB (ref 3.87–5.11)
RDW: 13.7 % (ref 11.5–15.5)
WBC: 6.4 10*3/uL (ref 4.0–10.5)
nRBC: 0 % (ref 0.0–0.2)

## 2018-09-01 MED ORDER — TERBINAFINE HCL 250 MG PO TABS: 250.0000 mg | ORAL_TABLET | Freq: Every day | ORAL | 0 refills | Status: DC

## 2018-09-01 NOTE — Progress Notes (Signed)
Terri Mayer  [phone] Fax:([phone]) [phone]   ID: YARETSI HUMPHRES DOB: Oct 27, 1951  MR#: [id number]  ZDG#:[id number]  Patient Care Team: [name], MD as PCP - General (Internal Medicine) Magrinat, Virgie Dad, MD as Consulting Physician (Oncology) Rolm Bookbinder, MD as Consulting Physician (General Surgery) Princess Bruins, MD as Consulting Physician (Obstetrics and Gynecology) Haroldine Laws, Shaune Pascal, MD as Consulting Physician (Cardiology) Laurence Spates, MD as Consulting Physician (Gastroenterology) PCP: [name], MD OTHER MD:  CHIEF COMPLAINT: right breast cancer  CURRENT TREATMENT: Observation  BREAST CANCER HISTORY: From the original intake note:  "Terri Mayer" underwent bilateral screening mammography with tomosynthesis at the breast center 11/02/2015 showing a possible mass in the right breast and low right axillary area. She was called back for right diagnostic mammography with tomosynthesis and right ultrasonography 11/14/2015. The breast density was category B. There was a lobulated mass in the lateral right breast, with a smaller oval mass immediately adjacent to it. Also in the upper outer quadrant there was a third mass. This has been stable back to 2014 and is felt to be a fibroadenoma.  Ultrasound of the right breast confirmed a hypoechoic mass at the 3:00 position 6 cm from the nipple, measuring 1.4 cm. Adjacent to this there was a 0.5 cm internal mammary lymph node, with normal morphology and normal hilar fat. The mass at the 10:00 position measured 0.8 cm and again was consistent with a fibroadenoma. In the right axilla there was an abnormal lymph node with no internal fat measuring 1.8 cm. The rest of the exam was unremarkable.  Biopsy of the right breast 8:30 o'clock lesion and the right axillary lymph node on 11/16/2015 showed ([id number]) both to be positive for invasive ductal carcinoma, grade 3, the breast mass being  estrogen receptor 10% positive with moderate staining intensity, but progesterone receptor negative, but the lymph node being estrogen and progesterone both negative. Both the breast and lymph nodes had MIB-1 of 30%. Both were positive for HER-2 amplification, the signals ratio is being 6.06 and 7.61, and the number per cell 10.00 and 11.80.  The patient's subsequent history is as detailed below   INTERVAL HISTORY: Terri Mayer returns today for follow-up of her bilateral great toe onychomycosis.  She was placed on terbinafine 3 months ago by Dr. Jana Hakim.  She notes mild improvement, but still persistent thickness and yellowing of the nails.  She has tolerated the terbinafine well without any issues.  Her liver enzymes have remained stable.     REVIEW OF SYSTEMS: Terri Mayer had a vitamin d level checked last month.  It was low.  She has doubled her current dose this past month.  She wants to make sure it is rechecked with her next set of labs.  Terri Mayer is doing well otherwise and a detailed ROS was otherwise non contributory.     PAST MEDICAL HISTORY: Past Medical History:  Diagnosis Date  . Allergy   . Breast cancer ([place])   . History of chicken pox   . Osteoarthritis    Neck,Back,Knees,Hips,Ankles  . Personal history of chemotherapy   . Personal history of radiation therapy     PAST SURGICAL HISTORY: Past Surgical History:  Procedure Laterality Date  . BREAST LUMPECTOMY Right 2017  . DILATION AND CURETTAGE OF UTERUS    . 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 PORTACATH PLACEMENT Right 12/11/2015   Procedure: INSERTION PORT-A-CATH WITH ULTRASOUND;  Surgeon: Rolm Bookbinder, MD;  Location: [facility];  Service: General;  Laterality: Right;  . RADIOACTIVE SEED GUIDED PARTIAL MASTECTOMY/AXILLARY SENTINEL NODE BIOPSY/AXILLARY NODE DISSECTION Right 05/02/2016   Procedure: RIGHT BREAST SEED GUIDED LUMPECTOMY AND RIGHT SEED TARGETED AXILLARY DISSECTION,AND SENTINEL NODE BIOPSY;   Surgeon: Rolm Bookbinder, MD;  Location: [facility];  Service: General;  Laterality: Right;  RIGHT BREAST SEED GUIDED LUMPECTOMY AND RIGHT SEED TARGETED AXILLARY DISSECTION,AND SENTINEL NODE BIOPSY  . WISDOM TOOTH EXTRACTION      FAMILY HISTORY Family History  Problem Relation Age of Onset  . Heart disease Father   . Diabetes Unknown        GM  . Dementia Unknown        M  . Transient ischemic attack Unknown        F  . Colon cancer Neg Hx   . Breast cancer Neg Hx    the patient's father died after multiple strokes at the age of 66. The patient's mother died at the age of 53 with Alzheimer's disease. The patient had one brother, one sister. The patient's paternal grandfather died from lung cancer in his 72s. On the mother's side there are 2 (second) cousins with breast cancer diagnosed in their early 66s, and a more remote cousin diagnosed with breast cancer at age 36. There is no history of ovarian cancer.   GYNECOLOGIC HISTORY:  No LMP recorded. Patient is postmenopausal.  menarche age 7, the patient is GX P0 and she understands that not carrying a child to term before the age of 54 essentially doubled the risk of breast cancer. She stopped having periods between age 31 and 38 and did not take hormone replacement. 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 She lives by herself, with her pet dog. Her son, adopted at birth, Zori Benbrook, Graduates from [employer] in the psychology Department May 2018.     ADVANCED DIRECTIVES: Mialee tells me as of March 2018 her sister [name] in [place] is still her healthcare power of attorney. She can be reached at [phone]. However she is intending to name her son [name] as her healthcare part of attorney. He can be reached at [phone]. In the second slot she will name her  sister-in-law [name] who is vice president of a hospital, to assist her son. [name] can be reached at [phone]. The patient's sister will no longer be her healthcare part of attorney.    HEALTH MAINTENANCE: Social History   Tobacco Use  . Smoking status: Never Smoker  . Smokeless tobacco: Never Used  Substance Use Topics  . Alcohol use: Yes    Comment: rarely   . Drug use: Not on file     Colonoscopy: 12/28/2004  PAP:  Bone density:  Lipid panel:  Allergies  Allergen Reactions  . Codeine Nausea And Vomiting    Current Outpatient Medications  Medication Sig Dispense Refill  . clobetasol ointment (TEMOVATE) 3.89 % Apply 1 application topically 2 (two) times a week. 30 g 4  . cyclobenzaprine (FLEXERIL) 10 MG tablet Take 10 mg by mouth 3 (three) times daily as needed for muscle spasms.    Marland Kitchen ibuprofen (ADVIL,MOTRIN) 200 MG tablet Take 200 mg by mouth every 6 (six) hours as needed for fever or mild pain. Reported on 03/22/2016    . Multiple Vitamin (MULTIVITAMIN) tablet  Take 1 tablet by mouth daily.    Marland Kitchen terbinafine (LAMISIL) 250 MG tablet Take 1 tablet (250 mg total) by mouth daily. 60 tablet 4   No current facility-administered medications for this visit.     OBJECTIVE: Middle-aged white woman who appears stated age  67:   09/01/18 1312  BP: 127/63  Pulse: 78  Resp: 18  Temp: 98.2 F (36.8 C)  SpO2: 99%     Body mass index is 38.64 kg/m.    ECOG FS:0 - Asymptomatic  GENERAL: Patient is a well appearing female in no acute distress HEENT:  Sclerae anicteric.  Oropharynx clear and moist. NODES:  No cervical, supraclavicular, or axillary lymphadenopathy palpated.  BREAST EXAM:  Deferred. Patient declined breast exam, noting she is only here to f/u on her onychomycosis LUNGS:  Clear to auscultation bilaterally.  No wheezes or rhonchi. HEART:  Regular rate and rhythm. No murmur appreciated. ABDOMEN:  Soft, nontender.  Positive, normoactive bowel sounds. No  organomegaly palpated. MSK:  No focal spinal tenderness to palpation.  EXTREMITIES:  No peripheral edema.  Nails appear stable to slightly improved when compared to picture below SKIN:  Clear with no obvious rashes or skin changes.  NEURO:  Nonfocal. Well oriented.  Appropriate affect.    Toenails 06/09/2018    LAB RESULTS:  CMP     Component Value Date/Time   NA 141 09/01/2018 1256   NA 142 02/13/2017 1136   K 4.8 09/01/2018 1256   K 4.8 02/13/2017 1136   CL 104 09/01/2018 1256   CO2 30 09/01/2018 1256   CO2 34 (H) 02/13/2017 1136   GLUCOSE 90 09/01/2018 1256   GLUCOSE 92 02/13/2017 1136   BUN 20 09/01/2018 1256   BUN 26.9 (H) 02/13/2017 1136   CREATININE 0.82 09/01/2018 1256   CREATININE 1.0 02/13/2017 1136   CALCIUM 9.9 09/01/2018 1256   CALCIUM 10.5 (H) 02/13/2017 1136   PROT 7.0 09/01/2018 1256   PROT 6.9 02/13/2017 1136   ALBUMIN 3.9 09/01/2018 1256   ALBUMIN 3.9 02/13/2017 1136   AST 22 09/01/2018 1256   AST 21 02/13/2017 1136   ALT 15 09/01/2018 1256   ALT 15 02/13/2017 1136   ALKPHOS 67 09/01/2018 1256   ALKPHOS 69 02/13/2017 1136   BILITOT 0.7 09/01/2018 1256   BILITOT 1.28 (H) 02/13/2017 1136   GFRNONAA >60 09/01/2018 1256   GFRAA >60 09/01/2018 1256    INo results found for: SPEP, UPEP  Lab Results  Component Value Date   WBC 6.4 09/01/2018   NEUTROABS 3.9 09/01/2018   HGB 13.7 09/01/2018   HCT 42.4 09/01/2018   MCV 82.5 09/01/2018   PLT 268 09/01/2018      Chemistry      Component Value Date/Time   NA 141 09/01/2018 1256   NA 142 02/13/2017 1136   K 4.8 09/01/2018 1256   K 4.8 02/13/2017 1136   CL 104 09/01/2018 1256   CO2 30 09/01/2018 1256   CO2 34 (H) 02/13/2017 1136   BUN 20 09/01/2018 1256   BUN 26.9 (H) 02/13/2017 1136   CREATININE 0.82 09/01/2018 1256   CREATININE 1.0 02/13/2017 1136      Component Value Date/Time   CALCIUM 9.9 09/01/2018 1256   CALCIUM 10.5 (H) 02/13/2017 1136   ALKPHOS 67 09/01/2018 1256   ALKPHOS 69  02/13/2017 1136   AST 22 09/01/2018 1256   AST 21 02/13/2017 1136   ALT 15 09/01/2018 1256   ALT 15 02/13/2017 1136  BILITOT 0.7 09/01/2018 1256   BILITOT 1.28 (H) 02/13/2017 1136       No results found for: LABCA2  No components found for: [id number]  No results for input(s): INR in the last 168 hours.   STUDIES:  Since her last visit, she underwent diagnostic bilateral mammography with CAD and tomography on 05/13/2018 at [facility] showing: breast density category B. There was no evidence of malignancy.  ASSESSMENT: 67 y.o. [place] woman status post right breast lower outer quadrant and right axillary lymph node biopsy 11/16/2015, also positive for a clinical  T1c pN1, stage IIA invasive ductal carcinoma grade 3, essentially estrogen and progesterone receptor negative but HER-2 amplified, with an MIB-1 of 30%  (a) the breast lesion had moderate positivity for the estrogen receptor at 10%; the axillary lymph node lesion was estrogen receptor negative. Both were progesterone receptor negative  (1) neoadjuvant chemotherapy with carboplatin, docetaxel, trastuzumab and pertuzumab for 6 cycles, started 12/18/2015  (a) docetaxel switched to gemcitabine for cycles 4-6 because of progressive neuropathy  (b) pertuzumab dose cut in half with cycle 4 due to excessive diarrhea, resumed full dose cycle 5  (2) trastuzumab continued to total one year, last dose 12/11/2016  (a) baseline echocardiogram on 12/12/15 showed an EF of 60-65%  (b) echocardiogram 04/19/2016 showed an ejection fraction in the 50-55% range  (c) final echocardiogram 10/22/2016 shows an ejection fraction in the 55-60% range. He  (3) right lumpectomy and sentinel lymph node sampling 05/02/2016 showed a complete pathologic response (ypT0, ypN0)  (4) adjuvant radiation 06/17/3016-07/12/2016 1. Right breast boost// 7.5 Gy with 3 fractions at a dose of 2.5 Gy/ fraction 2. Right breast// 42.5 Gy with 17 fractions at a dose  of 2.5 Gy/ fraction  (5) anastrozole prescribed May 2018 but never started by the patient  (a) initial breast biopsy showed 10% estrogen positivity with moderate staining intensity  (6) radiation recall rash 03/06/2017   PLAN: Terri Mayer is doing well today.  She has started the Terbinafine and is tolerating it well.  She notes mild improvement in her nails.  Since they are not clear, we will give an additional three months of terbinafine per Dr. Virgie Dad last recommendations.  I reviewed her labs which are normal.  Should this not improve the condition of her nails, then we would certainly consider referral to podiatry.    Terri Mayer has vitamin d deficiency and has doubled on her vitamin d intake since her level last month was 24.  I placed an order to also recheck her vitamin d in 3 months when she returns.    Lab, f/u with Dr. Jana Hakim in 3 months.  She knows to call for any problems that may develop before the next visit.  A total of (20) minutes of face-to-face time was spent with this patient with greater than 50% of that time in counseling and care-coordination.   Wilber Bihari, NP  09/01/18 1:31 PM Medical Oncology and Hematology [facility] [address] [place], [place] [address] Tel. [phone]    Fax. [phone]

## 2018-09-01 NOTE — Telephone Encounter (Signed)
Gave avs and calendar ° °

## 2018-09-11 ENCOUNTER — Ambulatory Visit (INDEPENDENT_AMBULATORY_CARE_PROVIDER_SITE_OTHER): Payer: PPO | Admitting: *Deleted

## 2018-09-11 DIAGNOSIS — Z23 Encounter for immunization: Secondary | ICD-10-CM | POA: Diagnosis not present

## 2018-11-30 NOTE — Progress Notes (Signed)
Terri Mayer  [phone] [phone]   ID: Terri Mayer DOB: 09-Nov-1951  MR#: [id number]  JJH#:[id number]  Patient Care Team: [name], MD as PCP - General (Internal Medicine) Burhan Barham, Virgie Dad, MD as Consulting Physician (Oncology) Rolm Bookbinder, MD as Consulting Physician (General Surgery) Princess Bruins, MD as Consulting Physician (Obstetrics and Gynecology) Haroldine Laws, Shaune Pascal, MD as Consulting Physician (Cardiology) Laurence Spates, MD as Consulting Physician (Gastroenterology) OTHER MD:  CHIEF COMPLAINT: right breast cancer  CURRENT TREATMENT: Observation  BREAST CANCER HISTORY: From the original intake note:  "Terri Mayer" underwent bilateral screening mammography with tomosynthesis at the breast center 11/02/2015 showing a possible mass in the right breast and low right axillary area. She was called back for right diagnostic mammography with tomosynthesis and right ultrasonography 11/14/2015. The breast density was category B. There was a lobulated mass in the lateral right breast, with a smaller oval mass immediately adjacent to it. Also in the upper outer quadrant there was a third mass. This has been stable back to 2014 and is felt to be a fibroadenoma.  Ultrasound of the right breast confirmed a hypoechoic mass at the 3:00 position 6 cm from the nipple, measuring 1.4 cm. Adjacent to this there was a 0.5 cm internal mammary lymph node, with normal morphology and normal hilar fat. The mass at the 10:00 position measured 0.8 cm and again was consistent with a fibroadenoma. In the right axilla there was an abnormal lymph node with no internal fat measuring 1.8 cm. The rest of the exam was unremarkable.  Biopsy of the right breast 8:30 o'clock lesion and the right axillary lymph node on 11/16/2015 showed ([id number]) both to be positive for invasive ductal carcinoma, grade 3, the breast mass being estrogen receptor 10% positive with  moderate staining intensity, but progesterone receptor negative, but the lymph node being estrogen and progesterone both negative. Both the breast and lymph nodes had MIB-1 of 30%. Both were positive for HER-2 amplification, the signals ratio is being 6.06 and 7.61, and the number per cell 10.00 and 11.80.  The patient's subsequent history is as detailed below    INTERVAL HISTORY: Terri Mayer returns today for follow-up and treatment of her right breast cancer.   She discontinued terbinafine after 3 1/2 months. She states that she was more concerned about her kidneys than her nails.  She did get some benefit but the nails did not entirely clear.  Her last mammography was on 05/13/2018 at [place].   Since her last visit here, she has not undergone any additional studies.     REVIEW OF SYSTEMS: Terri Mayer recently had to put her dog down. She has plans to catch up with friends and family. Her nails have improved slightly. For exercise, she has a [employer], but she has not been active recently. The patient denies unusual headaches, visual changes, nausea, vomiting, or dizziness. There has been no unusual cough, phlegm production, or pleurisy. This been no change in bowel or bladder habits. The patient denies unexplained fatigue or unexplained weight loss, bleeding, rash, or fever. A detailed review of systems was otherwise noncontributory.    PAST MEDICAL HISTORY: Past Medical History:  Diagnosis Date  . Allergy   . Breast cancer ([place])   . History of chicken pox   . Osteoarthritis    Neck,Back,Knees,Hips,Ankles  . Personal history of chemotherapy   . Personal history of radiation therapy     PAST SURGICAL HISTORY: Past Surgical History:  Procedure Laterality  Date  . BREAST LUMPECTOMY Right 2017  . DILATION AND CURETTAGE OF UTERUS    . EYE SURGERY  at age 60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  . PORTACATH PLACEMENT Right 12/11/2015   Procedure: INSERTION PORT-A-CATH WITH ULTRASOUND;  Surgeon:  Rolm Bookbinder, MD;  Location: [facility];  Service: General;  Laterality: Right;  . RADIOACTIVE SEED GUIDED PARTIAL MASTECTOMY/AXILLARY SENTINEL NODE BIOPSY/AXILLARY NODE DISSECTION Right 05/02/2016   Procedure: RIGHT BREAST SEED GUIDED LUMPECTOMY AND RIGHT SEED TARGETED AXILLARY DISSECTION,AND SENTINEL NODE BIOPSY;  Surgeon: Rolm Bookbinder, MD;  Location: [facility];  Service: General;  Laterality: Right;  RIGHT BREAST SEED GUIDED LUMPECTOMY AND RIGHT SEED TARGETED AXILLARY DISSECTION,AND SENTINEL NODE BIOPSY  . WISDOM TOOTH EXTRACTION      FAMILY HISTORY Family History  Problem Relation Age of Onset  . Heart disease Father   . Diabetes Unknown        GM  . Dementia Unknown        M  . Transient ischemic attack Unknown        F  . Colon cancer Neg Hx   . Breast cancer Neg Hx    The patient's father died after multiple strokes at the age of 41. The patient's mother died at the age of 37 with Alzheimer's disease. The patient had one brother, one sister. The patient's paternal grandfather died from lung cancer in his 78s. On the mother's side there are 2 (second) cousins with breast cancer diagnosed in their early 61s, and a more remote cousin diagnosed with breast cancer at age 27. There is no history of ovarian cancer.    GYNECOLOGIC HISTORY:  No LMP recorded. Patient is postmenopausal.  menarche age 29, the patient is GX P0 and she understands that not carrying a child to term before the age of 41 essentially doubled the risk of breast cancer. She stopped having periods between age 45 and 56 and did not take hormone replacement. 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 She lives by herself. Her son, adopted at birth, Terri Mayer, graduated from [employer] with a bachelors of Psychology and  Sociology in May 2018. Terri Mayer is currently working part time and intends to get into IT work.    ADVANCED DIRECTIVES: Terri Mayer tells me as of March 2018 her sister Terri Mayer holiday in [place] is still her healthcare power of attorney. She can be reached at [phone]. However she is intending to name her son Terri Mayer as her healthcare part of attorney. He can be reached at [phone]. In the second slot she will name her sister-in-law [name] who is vice president of a hospital, to assist her son. [name] can be reached at [phone]. The patient's sister will no longer be her healthcare part of attorney.    HEALTH MAINTENANCE: Social History   Tobacco Use  . Smoking status: Never Smoker  . Smokeless tobacco: Never Used  Substance Use Topics  . Alcohol use: Yes    Comment: rarely   . Drug use: Not on file     Colonoscopy: 12/28/2004  PAP:  Bone density:  Lipid panel:  Allergies  Allergen Reactions  . Codeine Nausea And Vomiting    Current Outpatient Medications  Medication Sig Dispense Refill  . clobetasol ointment (TEMOVATE) 8.54 % Apply 1 application topically 2 (  two) times a week. 30 g 4  . cyclobenzaprine (FLEXERIL) 10 MG tablet Take 10 mg by mouth 3 (three) times daily as needed for muscle spasms.    Marland Kitchen ibuprofen (ADVIL,MOTRIN) 200 MG tablet Take 200 mg by mouth every 6 (six) hours as needed for fever or mild pain. Reported on 03/22/2016    . Multiple Vitamin (MULTIVITAMIN) tablet Take 1 tablet by mouth daily.    Marland Kitchen terbinafine (LAMISIL) 250 MG tablet Take 1 tablet (250 mg total) by mouth daily. 90 tablet 0   No current facility-administered medications for this visit.     OBJECTIVE: Middle-aged white woman in no acute distress  Vitals:   12/02/18 1035  BP: 124/66  Pulse: 74  Resp: 18  Temp: 98.7 F (37.1 C)  SpO2: 99%     Body mass index is 38.58 kg/m.    ECOG FS:0 - Asymptomatic  Sclerae unicteric, EOMs intact Oropharynx clear and moist, dentition in  good repair No cervical or supraclavicular adenopathy Lungs no rales or rhonchi Heart regular rate and rhythm Abd soft, nontender, positive bowel sounds MSK no focal spinal tenderness, no upper extremity lymphedema Neuro: nonfocal, well oriented, appropriate affect Breasts: The right breast is status post lumpectomy and radiation.  There is the expected skin thickening, but no evidence of local recurrence.  The left breast is benign.  Both axillae are benign.    LAB RESULTS:  CMP     Component Value Date/Time   NA 143 12/02/2018 0949   NA 142 02/13/2017 1136   K 4.4 12/02/2018 0949   K 4.8 02/13/2017 1136   CL 106 12/02/2018 0949   CO2 29 12/02/2018 0949   CO2 34 (H) 02/13/2017 1136   GLUCOSE 95 12/02/2018 0949   GLUCOSE 92 02/13/2017 1136   BUN 14 12/02/2018 0949   BUN 26.9 (H) 02/13/2017 1136   CREATININE 0.81 12/02/2018 0949   CREATININE 1.0 02/13/2017 1136   CALCIUM 9.2 12/02/2018 0949   CALCIUM 10.5 (H) 02/13/2017 1136   PROT 6.8 12/02/2018 0949   PROT 6.9 02/13/2017 1136   ALBUMIN 3.8 12/02/2018 0949   ALBUMIN 3.9 02/13/2017 1136   AST 17 12/02/2018 0949   AST 21 02/13/2017 1136   ALT 15 12/02/2018 0949   ALT 15 02/13/2017 1136   ALKPHOS 70 12/02/2018 0949   ALKPHOS 69 02/13/2017 1136   BILITOT 0.8 12/02/2018 0949   BILITOT 1.28 (H) 02/13/2017 1136   GFRNONAA >60 12/02/2018 0949   GFRAA >60 12/02/2018 0949    INo results found for: SPEP, UPEP  Lab Results  Component Value Date   WBC 6.8 12/02/2018   NEUTROABS 4.4 12/02/2018   HGB 13.6 12/02/2018   HCT 43.4 12/02/2018   MCV 83.8 12/02/2018   PLT 272 12/02/2018      Chemistry      Component Value Date/Time   NA 143 12/02/2018 0949   NA 142 02/13/2017 1136   K 4.4 12/02/2018 0949   K 4.8 02/13/2017 1136   CL 106 12/02/2018 0949   CO2 29 12/02/2018 0949   CO2 34 (H) 02/13/2017 1136   BUN 14 12/02/2018 0949   BUN 26.9 (H) 02/13/2017 1136   CREATININE 0.81 12/02/2018 0949   CREATININE 1.0  02/13/2017 1136      Component Value Date/Time   CALCIUM 9.2 12/02/2018 0949   CALCIUM 10.5 (H) 02/13/2017 1136   ALKPHOS 70 12/02/2018 0949   ALKPHOS 69 02/13/2017 1136   AST 17 12/02/2018 0949   AST  21 02/13/2017 1136   ALT 15 12/02/2018 0949   ALT 15 02/13/2017 1136   BILITOT 0.8 12/02/2018 0949   BILITOT 1.28 (H) 02/13/2017 1136       No results found for: LABCA2  No components found for: LABCA125  No results for input(s): INR in the last 168 hours.   STUDIES:  No results found.   ASSESSMENT: 68 y.o. [place] woman status post right breast lower outer quadrant and right axillary lymph node biopsy 11/16/2015, also positive for a clinical  T1c pN1, stage IIA invasive ductal carcinoma grade 3, essentially estrogen and progesterone receptor negative but HER-2 amplified, with an MIB-1 of 30%  (a) the breast lesion had moderate positivity for the estrogen receptor at 10%; the axillary lymph node lesion was estrogen receptor negative. Both were progesterone receptor negative  (1) neoadjuvant chemotherapy with carboplatin, docetaxel, trastuzumab and pertuzumab for 6 cycles, started 12/18/2015  (a) docetaxel switched to gemcitabine for cycles 4-6 because of progressive neuropathy  (b) pertuzumab dose cut in half with cycle 4 due to excessive diarrhea, resumed full dose cycle 5  (2) trastuzumab continued to total one year, last dose 12/11/2016  (a) baseline echocardiogram on 12/12/15 showed an EF of 60-65%  (b) echocardiogram 04/19/2016 showed an ejection fraction in the 50-55% range  (c) final echocardiogram 10/22/2016 shows an ejection fraction in the 55-60% range. He  (3) right lumpectomy and sentinel lymph node sampling 05/02/2016 showed a complete pathologic response (ypT0, ypN0)  (4) adjuvant radiation 06/17/3016-07/12/2016 1. Right breast boost// 7.5 Gy with 3 fractions at a dose of 2.5 Gy/ fraction 2. Right breast// 42.5 Gy with 17 fractions at a dose of 2.5 Gy/  fraction  (5) anastrozole prescribed May 2018 but never started by the patient  (a) initial breast biopsy showed 10% estrogen positivity with moderate staining intensity  (6) radiation recall rash 03/06/2017   (7) onychomycosis: on terbinafine  PLAN: Terri Mayer is 2-1/2 years out from definitive surgery for her breast cancer.  There is no evidence of disease activity.  This is very favorable.  Her nails never did clear.  She could certainly continue on medication but what she would like to do is just observe for a while.  If the nail start getting worse she will let me know and we will get her a referral to dermatology.  From a breast cancer point of view she will have her next mammogram in June.  She will see me again in August.  And I will continue to see her yearly in August after her mammography until she completes the 5 years of follow-up  I encouraged her to increase her exercise program.  She is aware of the need to do that.  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  She will look into that  She knows to call for any other issue that may develop before the next visit here.    is doing well today.  She has started the Terbinafine and is tolerating it well.  She notes mild improvement in her nails.  Since they are not clear, we will give an additional three months of terbinafine per Dr. Virgie Dad last recommendations.  I reviewed her labs which are normal.  Should this not improve the condition of her nails, then we would certainly consider referral to podiatry.    Terri Mayer has vitamin d deficiency and has doubled on her vitamin d intake since her  level last month was 24.  I placed an order to also recheck her vitamin d in 3 months when she returns.    Lab, f/u with Dr. Jana Hakim in 3 months.  She knows to call for any problems that may develop before the next visit.  A total of (20) minutes of face-to-face time was spent  with this patient with greater than 50% of that time in counseling and care-coordination.    Jaxtin Raimondo, Virgie Dad, MD  12/02/18 10:59 AM Medical Oncology and Hematology [facility] [address] [place], [place] [address] Tel. [phone]    Fax. [phone]  I, Jacqualyn Posey am acting as a Education administrator for Chauncey Cruel, MD.   I, Lurline Del MD, have reviewed the above documentation for accuracy and completeness, and I agree with the above.

## 2018-12-02 ENCOUNTER — Inpatient Hospital Stay (HOSPITAL_BASED_OUTPATIENT_CLINIC_OR_DEPARTMENT_OTHER): Payer: PPO | Admitting: Oncology

## 2018-12-02 ENCOUNTER — Inpatient Hospital Stay: Payer: PPO | Attending: Oncology

## 2018-12-02 VITALS — BP 124/66 | HR 74 | Temp 98.7°F | Resp 18 | Wt 235.4 lb

## 2018-12-02 DIAGNOSIS — Z79899 Other long term (current) drug therapy: Secondary | ICD-10-CM | POA: Insufficient documentation

## 2018-12-02 DIAGNOSIS — Z171 Estrogen receptor negative status [ER-]: Secondary | ICD-10-CM

## 2018-12-02 DIAGNOSIS — C50211 Malignant neoplasm of upper-inner quadrant of right female breast: Secondary | ICD-10-CM

## 2018-12-02 DIAGNOSIS — Z9221 Personal history of antineoplastic chemotherapy: Secondary | ICD-10-CM | POA: Insufficient documentation

## 2018-12-02 DIAGNOSIS — Z853 Personal history of malignant neoplasm of breast: Secondary | ICD-10-CM | POA: Insufficient documentation

## 2018-12-02 DIAGNOSIS — M1990 Unspecified osteoarthritis, unspecified site: Secondary | ICD-10-CM | POA: Diagnosis not present

## 2018-12-02 DIAGNOSIS — Z801 Family history of malignant neoplasm of trachea, bronchus and lung: Secondary | ICD-10-CM | POA: Insufficient documentation

## 2018-12-02 DIAGNOSIS — B351 Tinea unguium: Secondary | ICD-10-CM

## 2018-12-02 DIAGNOSIS — Z803 Family history of malignant neoplasm of breast: Secondary | ICD-10-CM

## 2018-12-02 DIAGNOSIS — E559 Vitamin D deficiency, unspecified: Secondary | ICD-10-CM

## 2018-12-02 DIAGNOSIS — Z923 Personal history of irradiation: Secondary | ICD-10-CM

## 2018-12-02 LAB — CBC WITH DIFFERENTIAL/PLATELET
Abs Immature Granulocytes: 0.01 10*3/uL (ref 0.00–0.07)
Basophils Absolute: 0.1 10*3/uL (ref 0.0–0.1)
Basophils Relative: 1 %
Eosinophils Absolute: 0.2 10*3/uL (ref 0.0–0.5)
Eosinophils Relative: 3 %
HEMATOCRIT: 43.4 % (ref 36.0–46.0)
HEMOGLOBIN: 13.6 g/dL (ref 12.0–15.0)
IMMATURE GRANULOCYTES: 0 %
LYMPHS ABS: 1.5 10*3/uL (ref 0.7–4.0)
LYMPHS PCT: 22 %
MCH: 26.3 pg (ref 26.0–34.0)
MCHC: 31.3 g/dL (ref 30.0–36.0)
MCV: 83.8 fL (ref 80.0–100.0)
Monocytes Absolute: 0.7 10*3/uL (ref 0.1–1.0)
Monocytes Relative: 10 %
NEUTROS PCT: 64 %
Neutro Abs: 4.4 10*3/uL (ref 1.7–7.7)
Platelets: 272 10*3/uL (ref 150–400)
RBC: 5.18 MIL/uL — ABNORMAL HIGH (ref 3.87–5.11)
RDW: 13.4 % (ref 11.5–15.5)
WBC: 6.8 10*3/uL (ref 4.0–10.5)
nRBC: 0 % (ref 0.0–0.2)

## 2018-12-02 LAB — COMPREHENSIVE METABOLIC PANEL
ALBUMIN: 3.8 g/dL (ref 3.5–5.0)
ALT: 15 U/L (ref 0–44)
AST: 17 U/L (ref 15–41)
Alkaline Phosphatase: 70 U/L (ref 38–126)
Anion gap: 8 (ref 5–15)
BUN: 14 mg/dL (ref 8–23)
CHLORIDE: 106 mmol/L (ref 98–111)
CO2: 29 mmol/L (ref 22–32)
CREATININE: 0.81 mg/dL (ref 0.44–1.00)
Calcium: 9.2 mg/dL (ref 8.9–10.3)
GFR calc Af Amer: >60 mL/min (ref >60)
GFR calc non Af Amer: >60 mL/min (ref >60)
Glucose, Bld: 95 mg/dL (ref 70–99)
Potassium: 4.4 mmol/L (ref 3.5–5.1)
SODIUM: 143 mmol/L (ref 135–145)
Total Bilirubin: 0.8 mg/dL (ref 0.3–1.2)
Total Protein: 6.8 g/dL (ref 6.5–8.1)

## 2018-12-03 ENCOUNTER — Telehealth: Payer: Self-pay | Admitting: Oncology

## 2018-12-03 ENCOUNTER — Telehealth: Payer: Self-pay

## 2018-12-03 LAB — VITAMIN D 25 HYDROXY (VIT D DEFICIENCY, FRACTURES): VIT D 25 HYDROXY: 20.5 ng/mL — AB (ref 30.0–100.0)

## 2018-12-03 NOTE — Telephone Encounter (Signed)
Tell her to take 4000 per day.  Will need a recheck in 3 months.

## 2018-12-03 NOTE — Telephone Encounter (Signed)
-----   Message from Gardenia Phlegm, NP sent at 12/03/2018  8:27 AM EST ----- Please call patient.  Vitamin d is low.  Is she taking supplementation? ----- Message ----- From: Interface, Lab In [place] Sent: 12/03/2018   4:36 AM EST To: Gardenia Phlegm, NP

## 2018-12-03 NOTE — Telephone Encounter (Signed)
Scheduled appt per 1/16 sch message - pt is aware of appt date and time   

## 2018-12-03 NOTE — Telephone Encounter (Signed)
Spoke with patient to inform that per NP increase vitamin d to 4000 units daily.  Informed patient that she will need a recheck in 3 months.  Patient voiced understanding and did not have any further questions/concerns.

## 2018-12-03 NOTE — Telephone Encounter (Signed)
Spoke with patient to inform her that her vitamin D level is low.  Nurse asked about if she is taking any supplements.  Patient reports that she had went off of her vitamin d when her dog was sick  A few months back.  She just started back this past Sunday and is taking 1000 units two times a day.  Nurse will speak with NP to see if this sufficient and call patient back.

## 2019-02-02 ENCOUNTER — Telehealth: Payer: Self-pay | Admitting: Oncology

## 2019-02-02 NOTE — Telephone Encounter (Signed)
Called patient per 3/16 Vm log.  Cancelled patient lab appt per patient request.

## 2019-02-22 ENCOUNTER — Encounter: Payer: PPO | Admitting: Internal Medicine

## 2019-03-04 ENCOUNTER — Other Ambulatory Visit: Payer: PPO

## 2019-04-13 ENCOUNTER — Other Ambulatory Visit: Payer: Self-pay | Admitting: Oncology

## 2019-04-13 DIAGNOSIS — Z853 Personal history of malignant neoplasm of breast: Secondary | ICD-10-CM

## 2019-04-21 ENCOUNTER — Encounter: Payer: PPO | Admitting: Internal Medicine

## 2019-06-04 ENCOUNTER — Ambulatory Visit
Admission: RE | Admit: 2019-06-04 | Discharge: 2019-06-04 | Disposition: A | Discharge status: Home / Self Care | Payer: PPO | Source: Ambulatory Visit | Attending: Oncology | Admitting: Oncology

## 2019-06-04 ENCOUNTER — Other Ambulatory Visit: Payer: Self-pay

## 2019-06-04 DIAGNOSIS — Z853 Personal history of malignant neoplasm of breast: Secondary | ICD-10-CM | POA: Diagnosis not present

## 2019-06-04 DIAGNOSIS — R928 Other abnormal and inconclusive findings on diagnostic imaging of breast: Secondary | ICD-10-CM | POA: Diagnosis not present

## 2019-06-23 ENCOUNTER — Encounter: Payer: PPO | Admitting: Obstetrics & Gynecology

## 2019-07-07 NOTE — Progress Notes (Signed)
[place]  Telephone:(336) [phone] Fax:(336) [phone]   ID: Terri Mayer DOB: 1951-07-14  MR#: [id number]  DQQ#:[id number]  Patient Care Team: [name], MD as PCP - General (Internal Medicine) Magrinat, Virgie Dad, MD as Consulting Physician (Oncology) Rolm Bookbinder, MD as Consulting Physician (General Surgery) Princess Bruins, MD as Consulting Physician (Obstetrics and Gynecology) Haroldine Laws, Shaune Pascal, MD as Consulting Physician (Cardiology) Laurence Spates, MD as Consulting Physician (Gastroenterology) OTHER MD:  CHIEF COMPLAINT: right breast cancer  CURRENT TREATMENT: Observation  BREAST CANCER HISTORY: From the original intake note:  "Terri Mayer" underwent bilateral screening mammography with tomosynthesis at the breast center 11/02/2015 showing a possible mass in the right breast and low right axillary area. She was called back for right diagnostic mammography with tomosynthesis and right ultrasonography 11/14/2015. The breast density was category B. There was a lobulated mass in the lateral right breast, with a smaller oval mass immediately adjacent to it. Also in the upper outer quadrant there was a third mass. This has been stable back to 2014 and is felt to be a fibroadenoma.  Ultrasound of the right breast confirmed a hypoechoic mass at the 3:00 position 6 cm from the nipple, measuring 1.4 cm. Adjacent to this there was a 0.5 cm internal mammary lymph node, with normal morphology and normal hilar fat. The mass at the 10:00 position measured 0.8 cm and again was consistent with a fibroadenoma. In the right axilla there was an abnormal lymph node with no internal fat measuring 1.8 cm. The rest of the exam was unremarkable.  Biopsy of the right breast 8:30 o'clock lesion and the right axillary lymph node on 11/16/2015 showed ([id number]) both to be positive for invasive ductal carcinoma, grade 3, the breast mass being estrogen receptor 10% positive  with moderate staining intensity, but progesterone receptor negative, but the lymph node being estrogen and progesterone both negative. Both the breast and lymph nodes had MIB-1 of 30%. Both were positive for HER-2 amplification, the signals ratio is being 6.06 and 7.61, and the number per cell 10.00 and 11.80.  The patient's subsequent history is as detailed below    INTERVAL HISTORY: Terri Mayer returns today for follow-up and treatment of her right breast cancer. She was last seen on 12/02/2018.   She continues under observation.  She reports no symptoms suggestive of breast cancer recurrence  Since her last visit here, she underwent a digital diagnostic bilateral mammogram with tomography on 06/04/2019 showing: Breast Density Category B. There is no mammographic evidence for malignancy.     REVIEW OF SYSTEMS: Terri Mayer lives alone, and she is taking appropriate pandemic precautions.  She shops once a week.  She is exercising regularly, goes to the [employer] or 4 times a week, mostly swimming.  She works in her garden when it is not too hot.  Her toenails have not improved despite the Lamisil and she is thinking of seeing a foot doctor.  Aside from these issues a detailed review of systems today was stable   PAST MEDICAL HISTORY: Past Medical History:  Diagnosis Date  . Allergy   . Breast cancer ([place])   . History of chicken pox   . Osteoarthritis    Neck,Back,Knees,Hips,Ankles  . Personal history of chemotherapy   . Personal history of radiation therapy     PAST SURGICAL HISTORY: Past Surgical History:  Procedure Laterality Date  . BREAST LUMPECTOMY Right 2017  . DILATION AND CURETTAGE OF UTERUS    . EYE SURGERY  at  age 13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  . PORTACATH PLACEMENT Right 12/11/2015   Procedure: INSERTION PORT-A-CATH WITH ULTRASOUND;  Surgeon: Rolm Bookbinder, MD;  Location: [facility];  Service: General;  Laterality: Right;  . RADIOACTIVE SEED GUIDED PARTIAL  MASTECTOMY/AXILLARY SENTINEL NODE BIOPSY/AXILLARY NODE DISSECTION Right 05/02/2016   Procedure: RIGHT BREAST SEED GUIDED LUMPECTOMY AND RIGHT SEED TARGETED AXILLARY DISSECTION,AND SENTINEL NODE BIOPSY;  Surgeon: Rolm Bookbinder, MD;  Location: [facility];  Service: General;  Laterality: Right;  RIGHT BREAST SEED GUIDED LUMPECTOMY AND RIGHT SEED TARGETED AXILLARY DISSECTION,AND SENTINEL NODE BIOPSY  . WISDOM TOOTH EXTRACTION      FAMILY HISTORY Family History  Problem Relation Age of Onset  . Heart disease Father   . Diabetes Other        GM  . Dementia Other        M  . Transient ischemic attack Other        F  . Colon cancer Neg Hx   . Breast cancer Neg Hx    The patient's father died after multiple strokes at the age of 27. The patient's mother died at the age of 73 with Alzheimer's disease. The patient had one brother, one sister. The patient's paternal grandfather died from lung cancer in his 13s. On the mother's side there are 2 (second) cousins with breast cancer diagnosed in their early 21s, and a more remote cousin diagnosed with breast cancer at age 17. There is no history of ovarian cancer.    GYNECOLOGIC HISTORY:  No LMP recorded. Patient is postmenopausal.  menarche age 22, the patient is GX P0 and she understands that not carrying a child to term before the age of 55 essentially doubled the risk of breast cancer. She stopped having periods between age 58 and 23 and did not take hormone replacement. 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 She lives by herself. Her son, adopted at birth, Terri Mayer, graduated from [employer] with a bachelors of Psychology and Sociology in May 2018. 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    ADVANCED DIRECTIVES: [facility] tells me as of March 2018 her sister  Terri Mayer holiday in [place] is still her healthcare power of attorney. She can be reached at [phone]. However she is intending to name her son Terri Mayer as her healthcare part of attorney. He can be reached at [phone]. In the second slot she will name her sister-in-law Terri Mayer who is vice president of a hospital, to assist her son. Terri Mayer can be reached at [phone]. The patient's sister will no longer be her healthcare part of attorney.    HEALTH MAINTENANCE: Social History   Tobacco Use  . Smoking status: Never Smoker  . Smokeless tobacco: Never Used  Substance Use Topics  . Alcohol use: Yes    Comment: rarely   . Drug use: Not on file     Colonoscopy: 12/28/2004  PAP:  Bone density:  Lipid panel:  Allergies  Allergen Reactions  . Codeine Nausea And Vomiting    Current Outpatient Medications  Medication Sig Dispense Refill  . clobetasol ointment (TEMOVATE) 4.96 % Apply 1 application topically 2 (two) times a week. 30 g 4  . cyclobenzaprine (FLEXERIL) 10 MG tablet Take 10 mg by mouth 3 (three) times daily as needed for muscle  spasms.    Marland Kitchen ibuprofen (ADVIL,MOTRIN) 200 MG tablet Take 200 mg by mouth every 6 (six) hours as needed for fever or mild pain. Reported on 03/22/2016    . Multiple Vitamin (MULTIVITAMIN) tablet Take 1 tablet by mouth daily.    Marland Kitchen terbinafine (LAMISIL) 250 MG tablet Take 1 tablet (250 mg total) by mouth daily. 90 tablet 0   No current facility-administered medications for this visit.     OBJECTIVE: Middle-aged white woman who appears well  Vitals:   07/08/19 1336  BP: 127/67  Pulse: 81  Resp: 18  Temp: 98 F (36.7 C)  SpO2: 96%     Body mass index is 40.82 kg/m.     ECOG FS:0 - Asymptomatic  Sclerae unicteric, EOMs intact Wearing a mask No cervical or supraclavicular adenopathy Lungs no rales or rhonchi Heart regular rate and rhythm Abd soft, nontender, positive bowel sounds MSK no focal spinal tenderness, no upper extremity  lymphedema Neuro: nonfocal, well oriented, appropriate affect Breasts: The right breast is status post lumpectomy and radiation with no evidence of disease recurrence.  The left breast is benign.  Both axillae are benign.  LAB RESULTS:  CMP     Component Value Date/Time   NA 140 07/08/2019 1313   NA 142 02/13/2017 1136   K 4.4 07/08/2019 1313   K 4.8 02/13/2017 1136   CL 106 07/08/2019 1313   CO2 26 07/08/2019 1313   CO2 34 (H) 02/13/2017 1136   GLUCOSE 116 (H) 07/08/2019 1313   GLUCOSE 92 02/13/2017 1136   BUN 18 07/08/2019 1313   BUN 26.9 (H) 02/13/2017 1136   CREATININE 0.85 07/08/2019 1313   CREATININE 1.0 02/13/2017 1136   CALCIUM 9.3 07/08/2019 1313   CALCIUM 10.5 (H) 02/13/2017 1136   PROT 6.9 07/08/2019 1313   PROT 6.9 02/13/2017 1136   ALBUMIN 3.7 07/08/2019 1313   ALBUMIN 3.9 02/13/2017 1136   AST 18 07/08/2019 1313   AST 21 02/13/2017 1136   ALT 14 07/08/2019 1313   ALT 15 02/13/2017 1136   ALKPHOS 72 07/08/2019 1313   ALKPHOS 69 02/13/2017 1136   BILITOT 0.7 07/08/2019 1313   BILITOT 1.28 (H) 02/13/2017 1136   GFRNONAA >60 07/08/2019 1313   GFRAA >60 07/08/2019 1313    INo results found for: SPEP, UPEP  Lab Results  Component Value Date   WBC 7.9 07/08/2019   NEUTROABS 5.2 07/08/2019   HGB 14.3 07/08/2019   HCT 44.8 07/08/2019   MCV 82.4 07/08/2019   PLT 316 07/08/2019      Chemistry      Component Value Date/Time   NA 140 07/08/2019 1313   NA 142 02/13/2017 1136   K 4.4 07/08/2019 1313   K 4.8 02/13/2017 1136   CL 106 07/08/2019 1313   CO2 26 07/08/2019 1313   CO2 34 (H) 02/13/2017 1136   BUN 18 07/08/2019 1313   BUN 26.9 (H) 02/13/2017 1136   CREATININE 0.85 07/08/2019 1313   CREATININE 1.0 02/13/2017 1136      Component Value Date/Time   CALCIUM 9.3 07/08/2019 1313   CALCIUM 10.5 (H) 02/13/2017 1136   ALKPHOS 72 07/08/2019 1313   ALKPHOS 69 02/13/2017 1136   AST 18 07/08/2019 1313   AST 21 02/13/2017 1136   ALT 14 07/08/2019  1313   ALT 15 02/13/2017 1136   BILITOT 0.7 07/08/2019 1313   BILITOT 1.28 (H) 02/13/2017 1136       No results found for: LABCA2  No components found for: LABCA125  No results for input(s): INR in the last 168 hours.   STUDIES:  No results found.   ASSESSMENT: 68 y.o. [place] woman status post right breast lower outer quadrant and right axillary lymph node biopsy 11/16/2015, also positive for a clinical  T1c pN1, stage IIA invasive ductal carcinoma grade 3, essentially estrogen and progesterone receptor negative but HER-2 amplified, with an MIB-1 of 30%  (a) the breast lesion had moderate positivity for the estrogen receptor at 10%; the axillary lymph node lesion was estrogen receptor negative. Both were progesterone receptor negative  (1) neoadjuvant chemotherapy with carboplatin, docetaxel, trastuzumab and pertuzumab for 6 cycles, started 12/18/2015  (a) docetaxel switched to gemcitabine for cycles 4-6 because of progressive neuropathy  (b) pertuzumab dose cut in half with cycle 4 due to excessive diarrhea, resumed full dose cycle 5  (2) trastuzumab continued to total one year, last dose 12/11/2016  (a) baseline echocardiogram on 12/12/15 showed an EF of 60-65%  (b) echocardiogram 04/19/2016 showed an ejection fraction in the 50-55% range  (c) final echocardiogram 10/22/2016 shows an ejection fraction in the 55-60% range. He  (3) right lumpectomy and sentinel lymph node sampling 05/02/2016 showed a complete pathologic response (ypT0, ypN0)  (4) adjuvant radiation 06/17/3016-07/12/2016 1. Right breast boost// 7.5 Gy with 3 fractions at a dose of 2.5 Gy/ fraction 2. Right breast// 42.5 Gy with 17 fractions at a dose of 2.5 Gy/ fraction  (5) anastrozole prescribed May 2018 but never started by the patient  (a) initial breast biopsy showed 10% estrogen positivity with moderate staining intensity  (6) radiation recall rash 03/06/2017 --resolved  (7) onychomycosis: on  terbinafine without resolution   PLAN: Terri Mayer is now 3 and half years out from definitive surgery for her breast cancer with no evidence of disease recurrence.  This is very favorable.  Today she looks a healthy as she has since I started seeing her.  I think the swimming is making an enormous difference to her.  Unfortunately her nails have not improved with her phentermine.  She will be seeing a foot doctor.  She is very concerned about the virus and appropriately so since she goes to the [employer] times a week.  I suggested she consider the current phase 3 study here in town using the MRI and a [employer] vaccine  We reviewed her mammogram results which are fine.  She will see me again in a year.  She knows to call for any other issue that may develop before Magrinat, Virgie Dad, MD  07/08/19 1:58 PM Medical Oncology and Hematology [facility] then. [place], [place] [address] Tel. [phone]    Fax. [phone]  I, Jacqualyn Posey am acting as a Education administrator for Chauncey Cruel, MD.   I, Lurline Del MD, have reviewed the above documentation for accuracy and completeness, and I agree with the above.

## 2019-07-08 ENCOUNTER — Inpatient Hospital Stay: Payer: PPO | Attending: Oncology | Admitting: Oncology

## 2019-07-08 ENCOUNTER — Inpatient Hospital Stay: Payer: PPO

## 2019-07-08 ENCOUNTER — Other Ambulatory Visit: Payer: Self-pay

## 2019-07-08 VITALS — BP 127/67 | HR 81 | Temp 98.0°F | Resp 18 | Ht 65.5 in | Wt 249.1 lb

## 2019-07-08 DIAGNOSIS — Z9221 Personal history of antineoplastic chemotherapy: Secondary | ICD-10-CM | POA: Diagnosis not present

## 2019-07-08 DIAGNOSIS — C50211 Malignant neoplasm of upper-inner quadrant of right female breast: Secondary | ICD-10-CM

## 2019-07-08 DIAGNOSIS — Z171 Estrogen receptor negative status [ER-]: Secondary | ICD-10-CM | POA: Diagnosis not present

## 2019-07-08 DIAGNOSIS — Z923 Personal history of irradiation: Secondary | ICD-10-CM | POA: Insufficient documentation

## 2019-07-08 DIAGNOSIS — Z853 Personal history of malignant neoplasm of breast: Secondary | ICD-10-CM | POA: Diagnosis not present

## 2019-07-08 DIAGNOSIS — B351 Tinea unguium: Secondary | ICD-10-CM | POA: Diagnosis not present

## 2019-07-08 DIAGNOSIS — Z79899 Other long term (current) drug therapy: Secondary | ICD-10-CM | POA: Insufficient documentation

## 2019-07-08 LAB — COMPREHENSIVE METABOLIC PANEL
ALT: 14 U/L (ref 0–44)
AST: 18 U/L (ref 15–41)
Albumin: 3.7 g/dL (ref 3.5–5.0)
Alkaline Phosphatase: 72 U/L (ref 38–126)
Anion gap: 8 (ref 5–15)
BUN: 18 mg/dL (ref 8–23)
CO2: 26 mmol/L (ref 22–32)
Calcium: 9.3 mg/dL (ref 8.9–10.3)
Chloride: 106 mmol/L (ref 98–111)
Creatinine, Ser: 0.85 mg/dL (ref 0.44–1.00)
GFR calc Af Amer: >60 mL/min (ref >60)
GFR calc non Af Amer: >60 mL/min (ref >60)
Glucose, Bld: 116 mg/dL — ABNORMAL HIGH (ref 70–99)
Potassium: 4.4 mmol/L (ref 3.5–5.1)
Sodium: 140 mmol/L (ref 135–145)
Total Bilirubin: 0.7 mg/dL (ref 0.3–1.2)
Total Protein: 6.9 g/dL (ref 6.5–8.1)

## 2019-07-08 LAB — CBC WITH DIFFERENTIAL/PLATELET
Abs Immature Granulocytes: 0.03 10*3/uL (ref 0.00–0.07)
Basophils Absolute: 0 10*3/uL (ref 0.0–0.1)
Basophils Relative: 1 %
Eosinophils Absolute: 0.3 10*3/uL (ref 0.0–0.5)
Eosinophils Relative: 3 %
HCT: 44.8 % (ref 36.0–46.0)
Hemoglobin: 14.3 g/dL (ref 12.0–15.0)
Immature Granulocytes: 0 %
Lymphocytes Relative: 22 %
Lymphs Abs: 1.7 10*3/uL (ref 0.7–4.0)
MCH: 26.3 pg (ref 26.0–34.0)
MCHC: 31.9 g/dL (ref 30.0–36.0)
MCV: 82.4 fL (ref 80.0–100.0)
Monocytes Absolute: 0.6 10*3/uL (ref 0.1–1.0)
Monocytes Relative: 8 %
Neutro Abs: 5.2 10*3/uL (ref 1.7–7.7)
Neutrophils Relative %: 66 %
Platelets: 316 10*3/uL (ref 150–400)
RBC: 5.44 MIL/uL — ABNORMAL HIGH (ref 3.87–5.11)
RDW: 13.6 % (ref 11.5–15.5)
WBC: 7.9 10*3/uL (ref 4.0–10.5)
nRBC: 0 % (ref 0.0–0.2)

## 2019-07-08 MED ORDER — PNEUMOCOCCAL VAC POLYVALENT 25 MCG/0.5ML IJ INJ: 0.5000 mL | INJECTION | Freq: Once | INTRAMUSCULAR | Status: DC

## 2019-07-08 NOTE — Addendum Note (Signed)
Addended by: Lurline Del C on: 07/08/2019 06:00 PM   Modules accepted: Orders

## 2019-07-09 ENCOUNTER — Other Ambulatory Visit: Payer: Self-pay | Admitting: Oncology

## 2019-07-09 ENCOUNTER — Telehealth: Payer: Self-pay | Admitting: Oncology

## 2019-07-09 ENCOUNTER — Encounter: Payer: Self-pay | Admitting: Oncology

## 2019-07-09 LAB — VITAMIN D 25 HYDROXY (VIT D DEFICIENCY, FRACTURES): Vit D, 25-Hydroxy: 24.8 ng/mL — ABNORMAL LOW (ref 30.0–100.0)

## 2019-07-09 NOTE — Telephone Encounter (Signed)
I talk with patient regarding schedule  

## 2019-07-23 ENCOUNTER — Encounter: Payer: PPO | Admitting: Obstetrics & Gynecology

## 2019-08-17 ENCOUNTER — Other Ambulatory Visit: Payer: Self-pay

## 2019-08-18 ENCOUNTER — Ambulatory Visit (INDEPENDENT_AMBULATORY_CARE_PROVIDER_SITE_OTHER): Payer: PPO | Admitting: Obstetrics & Gynecology

## 2019-08-18 ENCOUNTER — Encounter: Payer: Self-pay | Admitting: Obstetrics & Gynecology

## 2019-08-18 VITALS — BP 122/83 | Ht 66.5 in | Wt 246.0 lb

## 2019-08-18 DIAGNOSIS — Z01419 Encounter for gynecological examination (general) (routine) without abnormal findings: Secondary | ICD-10-CM

## 2019-08-18 DIAGNOSIS — Z6839 Body mass index (BMI) 39.0-39.9, adult: Secondary | ICD-10-CM | POA: Diagnosis not present

## 2019-08-18 DIAGNOSIS — Z9289 Personal history of other medical treatment: Secondary | ICD-10-CM

## 2019-08-18 DIAGNOSIS — M8588 Other specified disorders of bone density and structure, other site: Secondary | ICD-10-CM

## 2019-08-18 DIAGNOSIS — C50211 Malignant neoplasm of upper-inner quadrant of right female breast: Secondary | ICD-10-CM

## 2019-08-18 DIAGNOSIS — E6609 Other obesity due to excess calories: Secondary | ICD-10-CM

## 2019-08-18 DIAGNOSIS — Z171 Estrogen receptor negative status [ER-]: Secondary | ICD-10-CM | POA: Diagnosis not present

## 2019-08-18 DIAGNOSIS — Z780 Asymptomatic menopausal state: Secondary | ICD-10-CM | POA: Diagnosis not present

## 2019-08-18 DIAGNOSIS — N904 Leukoplakia of vulva: Secondary | ICD-10-CM | POA: Diagnosis not present

## 2019-08-18 DIAGNOSIS — M85851 Other specified disorders of bone density and structure, right thigh: Secondary | ICD-10-CM

## 2019-08-18 DIAGNOSIS — Z853 Personal history of malignant neoplasm of breast: Secondary | ICD-10-CM | POA: Diagnosis not present

## 2019-08-18 MED ORDER — CLOBETASOL PROPIONATE 0.05 % EX CREA: 1.0000 "application " | TOPICAL_CREAM | Freq: Two times a day (BID) | CUTANEOUS | 4 refills | Status: DC

## 2019-08-18 NOTE — Progress Notes (Signed)
Terri Mayer [birth year] [id number]   History:    68 y.o. G0 Divorced, single.  RP:  Established patient presenting for annual gyn exam   HPI: Postmenopausal, well on no hormone replacement therapy.  No postmenopausal bleeding.  No pelvic pain.  Vulvar itching and discomfort associated with Lichen Sclerosus.  Abstinent.  History of Rt Invasive Ductal Breast Ca in 2016.  Partial Rt breast Mastectomy and Radiation Therapy, followed by Dr Jana Hakim.  BMI 39.11.  Needs to increase physical activity.  Health labs with Fam MD.  Past medical history,surgical history, family history and social history were all reviewed and documented in the EPIC chart.  Gynecologic History No LMP recorded. Patient is postmenopausal. Contraception: abstinence and post menopausal status Last Pap: 06/2018. Results were: Negative Last mammogram: 05/2019. Results were: Bilateral Dx mammo benign Bone Density: 04/2017 Osteopenia T-Score -1.2 Rt femoral neck Colonoscopy: 2017  Obstetric History OB History  Gravida Para Term Preterm AB Living  0 0 0 0 0 0  SAB TAB Ectopic Multiple Live Births  0 0 0 0 0     ROS: A ROS was performed and pertinent positives and negatives are included in the history.  GENERAL: No fevers or chills. HEENT: No change in vision, no earache, sore throat or sinus congestion. NECK: No pain or stiffness. CARDIOVASCULAR: No chest pain or pressure. No palpitations. PULMONARY: No shortness of breath, cough or wheeze. GASTROINTESTINAL: No abdominal pain, nausea, vomiting or diarrhea, melena or bright red blood per rectum. GENITOURINARY: No urinary frequency, urgency, hesitancy or dysuria. MUSCULOSKELETAL: No joint or muscle pain, no back pain, no recent trauma. DERMATOLOGIC: No rash, no itching, no lesions. ENDOCRINE: No polyuria, polydipsia, no heat or cold intolerance. No recent change in weight. HEMATOLOGICAL: No anemia or easy bruising or bleeding. NEUROLOGIC: No headache, seizures, numbness,  tingling or weakness. PSYCHIATRIC: No depression, no loss of interest in normal activity or change in sleep pattern.     Exam:   BP 122/83 (BP Location: Left Arm, Patient Position: Sitting, Cuff Size: Normal)   Ht 5' 6.5" (1.689 m)   Wt 246 lb (111.6 kg)   BMI 39.11 kg/m   Body mass index is 39.11 kg/m.  General appearance : Well developed well nourished female. No acute distress HEENT: Eyes: no retinal hemorrhage or exudates,  Neck supple, trachea midline, no carotid bruits, no thyroidmegaly Lungs: Clear to auscultation, no rhonchi or wheezes, or rib retractions  Heart: Regular rate and rhythm, no murmurs or gallops Breast:Examined in sitting and supine position were symmetrical in appearance, no palpable masses or tenderness,  no skin retraction, no nipple inversion, no nipple discharge, no skin discoloration, no axillary or supraclavicular lymphadenopathy Abdomen: no palpable masses or tenderness, no rebound or guarding Extremities: no edema or skin discoloration or tenderness  Pelvic: Vulva: White atrophy in butterfly distribution             Vagina: No gross lesions or discharge  Cervix: No gross lesions or discharge  Uterus  AV, normal size, shape and consistency, non-tender and mobile  Adnexa  Without masses or tenderness  Anus: Normal   Assessment/Plan:  68 y.o. female for annual exam   1. Well female exam with routine gynecological exam Normal gynecologic exam except for vulvar lichen sclerosis.  Pap test August 2019 was negative, no indication to repeat this year.  Right breast exam status post partial mastectomy, left breast exam normal.  Bilateral diagnostic mammogram in July 2020 was benign.  Colonoscopy in 2017.  Health labs with family physician.  2. Menopause present Well on no hormone replacement therapy.  No postmenopausal bleeding.  3. Osteopenia of neck of right femur Schedule bone density here now.  Vitamin D supplements, calcium intake of 1200 mg daily  and regular weightbearing physical activity is recommended. - DG Bone Density; Future  4. Lichen sclerosus et atrophicus of the vulva Clobetasol cream prescribed, will use twice a day for 2 weeks and then 3 times a week long-term.  5. Malignant neoplasm of upper-inner quadrant of right breast in female, estrogen receptor negative ([place]) Rt Breast invasive ductal carcinoma.  Followed by Dr. Jana Hakim.  6. Class 2 obesity due to excess calories without serious comorbidity with body mass index (BMI) of 39.0 to 39.9 in adult Recommend a low calorie/carb diet such as [employer].  Aerobic physical activities 5 times a week and weightlifting every 2 days.  Other orders - cholecalciferol (VITAMIN D3) 25 MCG (1000 UT) tablet; Take 1,000 Units by mouth daily. - clobetasol cream (TEMOVATE) 0.05 %; Apply 1 application topically 2 (two) times daily. Thin application on vulva and perianal area BID x 2 weeks, then apply 3 times a week long term.  Counseling on above issues and coordination of care more than 50% for 15 minutes.  Princess Bruins MD, 12:30 PM 08/18/2019

## 2019-08-18 NOTE — Patient Instructions (Signed)
1. Well female exam with routine gynecological exam Normal gynecologic exam except for vulvar lichen sclerosis.  Pap test August 2019 was negative, no indication to repeat this year.  Right breast exam status post partial mastectomy, left breast exam normal.  Bilateral diagnostic mammogram in July 2020 was benign.  Colonoscopy in 2017.  Health labs with family physician.  2. Menopause present Well on no hormone replacement therapy.  No postmenopausal bleeding.  3. Osteopenia of neck of right femur Schedule bone density here now.  Vitamin D supplements, calcium intake of 1200 mg daily and regular weightbearing physical activity is recommended. - DG Bone Density; Future  4. Lichen sclerosus et atrophicus of the vulva Clobetasol cream prescribed, will use twice a day for 2 weeks and then 3 times a week long-term.  5. Malignant neoplasm of upper-inner quadrant of right breast in female, estrogen receptor negative ([place]) Rt Breast invasive ductal carcinoma.  Followed by Dr. Jana Hakim.  6. Class 2 obesity due to excess calories without serious comorbidity with body mass index (BMI) of 39.0 to 39.9 in adult Recommend a low calorie/carb diet such as [employer].  Aerobic physical activities 5 times a week and weightlifting every 2 days.  Other orders - cholecalciferol (VITAMIN D3) 25 MCG (1000 UT) tablet; Take 1,000 Units by mouth daily. - clobetasol cream (TEMOVATE) 0.05 %; Apply 1 application topically 2 (two) times daily. Thin application on vulva and perianal area BID x 2 weeks, then apply 3 times a week long term.  Terri Mayer, it was a pleasure seeing you today!

## 2019-09-09 ENCOUNTER — Encounter: Payer: Self-pay | Admitting: Internal Medicine

## 2019-09-09 ENCOUNTER — Ambulatory Visit (INDEPENDENT_AMBULATORY_CARE_PROVIDER_SITE_OTHER): Payer: PPO | Admitting: Internal Medicine

## 2019-09-09 ENCOUNTER — Other Ambulatory Visit: Payer: Self-pay

## 2019-09-09 ENCOUNTER — Other Ambulatory Visit (INDEPENDENT_AMBULATORY_CARE_PROVIDER_SITE_OTHER): Payer: PPO

## 2019-09-09 ENCOUNTER — Encounter: Payer: Self-pay | Admitting: Oncology

## 2019-09-09 VITALS — BP 112/80 | HR 81 | Temp 98.3°F | Ht 66.5 in | Wt 254.0 lb

## 2019-09-09 DIAGNOSIS — Z0000 Encounter for general adult medical examination without abnormal findings: Secondary | ICD-10-CM

## 2019-09-09 DIAGNOSIS — Z23 Encounter for immunization: Secondary | ICD-10-CM

## 2019-09-09 LAB — LIPID PANEL
Cholesterol: 208 mg/dL — ABNORMAL HIGH (ref 0–200)
HDL: 37.3 mg/dL — ABNORMAL LOW (ref >39.00)
LDL Cholesterol: 140 mg/dL — ABNORMAL HIGH (ref 0–99)
NonHDL: 170.3
Total CHOL/HDL Ratio: 6
Triglycerides: 151 mg/dL — ABNORMAL HIGH (ref 0.0–149.0)
VLDL: 30.2 mg/dL (ref 0.0–40.0)

## 2019-09-09 LAB — HEMOGLOBIN A1C: Hgb A1c MFr Bld: 5.9 % (ref 4.6–6.5)

## 2019-09-09 NOTE — Progress Notes (Signed)
Subjective:   Patient ID: Terri Mayer, female    DOB: December 05, 1950, 68 y.o.   MRN: [id number]  HPI Here for medicare wellness and physical, no new complaints. Please see A/P for status and treatment of chronic medical problems.   Diet: heart healthy Physical activity: sedentary Depression/mood screen: negative Hearing: intact to whispered voice Visual acuity: grossly normal, performs annual eye exam  ADLs: capable Fall risk: none Home safety: good Cognitive evaluation: intact to orientation, naming, recall and repetition EOL planning: adv directives discussed    Office Visit from 09/09/2019 in [place]  PHQ-2 Total Score  0      I have personally reviewed and have noted 1. The patient's medical and social history - reviewed today no changes 2. Their use of alcohol, tobacco or illicit drugs 3. Their current medications and supplements 4. The patient's functional ability including ADL's, fall risks, home safety risks and hearing or visual impairment. 5. Diet and physical activities 6. Evidence for depression or mood disorders 7. Care team reviewed and updated  Patient Care Team: [name], MD as PCP - General (Internal Medicine) Magrinat, Virgie Dad, MD as Consulting Physician (Oncology) Rolm Bookbinder, MD as Consulting Physician (General Surgery) Princess Bruins, MD as Consulting Physician (Obstetrics and Gynecology) Haroldine Laws, Shaune Pascal, MD as Consulting Physician (Cardiology) Laurence Spates, MD as Consulting Physician (Gastroenterology) Past Medical History:  Diagnosis Date  . Allergy   . Breast cancer ([place])   . History of chicken pox   . Osteoarthritis    Neck,Back,Knees,Hips,Ankles  . Personal history of chemotherapy   . Personal history of radiation therapy    Past Surgical History:  Procedure Laterality Date  . BREAST LUMPECTOMY Right 2017  . DILATION AND CURETTAGE OF UTERUS    . EYE SURGERY  at age 32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  . PORTACATH PLACEMENT Right 12/11/2015   Procedure: INSERTION PORT-A-CATH WITH ULTRASOUND;  Surgeon: Rolm Bookbinder, MD;  Location: [facility];  Service: General;  Laterality: Right;  . RADIOACTIVE SEED GUIDED PARTIAL MASTECTOMY/AXILLARY SENTINEL NODE BIOPSY/AXILLARY NODE DISSECTION Right 05/02/2016   Procedure: RIGHT BREAST SEED GUIDED LUMPECTOMY AND RIGHT SEED TARGETED AXILLARY DISSECTION,AND SENTINEL NODE BIOPSY;  Surgeon: Rolm Bookbinder, MD;  Location: [facility];  Service: General;  Laterality: Right;  RIGHT BREAST SEED GUIDED LUMPECTOMY AND RIGHT SEED TARGETED AXILLARY DISSECTION,AND SENTINEL NODE BIOPSY  . WISDOM TOOTH EXTRACTION     Family History  Problem Relation Age of Onset  . Heart disease Father   . Diabetes Other        GM  . Dementia Other        M  . Transient ischemic attack Other        F  . Charcot-Marie-Tooth disease Brother   . Colon cancer Neg Hx   . Breast cancer Neg Hx     Review of Systems  Constitutional: Negative.   HENT: Negative.   Eyes: Negative.   Respiratory: Negative for cough, chest tightness and shortness of breath.   Cardiovascular: Negative for chest pain, palpitations and leg swelling.  Gastrointestinal: Negative for abdominal distention, abdominal pain, constipation, diarrhea, nausea and vomiting.  Musculoskeletal: Negative.   Skin: Negative.   Neurological: Negative.   Psychiatric/Behavioral: Negative.     Objective:  Physical Exam Constitutional:      Appearance: She is well-developed. She is obese.  HENT:     Head: Normocephalic and atraumatic.  Neck:     Musculoskeletal: Normal  range of motion.  Cardiovascular:     Rate and Rhythm: Normal rate and regular rhythm.  Pulmonary:     Effort: Pulmonary effort is normal. No respiratory distress.     Breath sounds: Normal breath sounds. No wheezing or rales.  Abdominal:     General: Bowel sounds are normal. There is no  distension.     Palpations: Abdomen is soft.     Tenderness: There is no abdominal tenderness. There is no rebound.  Skin:    General: Skin is warm and dry.  Neurological:     Mental Status: She is alert and oriented to person, place, and time.     Coordination: Coordination normal.     Vitals:   09/09/19 1255  BP: 112/80  Pulse: 81  Temp: 98.3 F (36.8 C)  TempSrc: Oral  SpO2: 97%  Weight: 254 lb (115.2 kg)  Height: 5' 6.5" (1.689 m)    Assessment & Plan:  Flu shot given at visit

## 2019-09-09 NOTE — Assessment & Plan Note (Signed)
Weight up with the pandemic and she is not sure she is able to work seriously on this now.

## 2019-09-09 NOTE — Patient Instructions (Signed)
Health Maintenance, Female Adopting a healthy lifestyle and getting preventive care are important in promoting health and wellness. Ask your health care provider about:  The right schedule for you to have regular tests and exams.  Things you can do on your own to prevent diseases and keep yourself healthy. What should I know about diet, weight, and exercise? Eat a healthy diet   Eat a diet that includes plenty of vegetables, fruits, low-fat dairy products, and lean protein.  Do not eat a lot of foods that are high in solid fats, added sugars, or sodium. Maintain a healthy weight Body mass index (BMI) is used to identify weight problems. It estimates body fat based on height and weight. Your health care provider can help determine your BMI and help you achieve or maintain a healthy weight. Get regular exercise Get regular exercise. This is one of the most important things you can do for your health. Most adults should:  Exercise for at least 150 minutes each week. The exercise should increase your heart rate and make you sweat (moderate-intensity exercise).  Do strengthening exercises at least twice a week. This is in addition to the moderate-intensity exercise.  Spend less time sitting. Even light physical activity can be beneficial. Watch cholesterol and blood lipids Have your blood tested for lipids and cholesterol at 69 years of age, then have this test every 5 years. Have your cholesterol levels checked more often if:  Your lipid or cholesterol levels are high.  You are older than 69 years of age.  You are at high risk for heart disease. What should I know about cancer screening? Depending on your health history and family history, you may need to have cancer screening at various ages. This may include screening for:  Breast cancer.  Cervical cancer.  Colorectal cancer.  Skin cancer.  Lung cancer. What should I know about heart disease, diabetes, and high blood  pressure? Blood pressure and heart disease  High blood pressure causes heart disease and increases the risk of stroke. This is more likely to develop in people who have high blood pressure readings, are of African descent, or are overweight.  Have your blood pressure checked: ? Every 3-5 years if you are 18-39 years of age. ? Every year if you are 40 years old or older. Diabetes Have regular diabetes screenings. This checks your fasting blood sugar level. Have the screening done:  Once every three years after age 40 if you are at a normal weight and have a low risk for diabetes.  More often and at a younger age if you are overweight or have a high risk for diabetes. What should I know about preventing infection? Hepatitis B If you have a higher risk for hepatitis B, you should be screened for this virus. Talk with your health care provider to find out if you are at risk for hepatitis B infection. Hepatitis C Testing is recommended for:  Everyone born from 1945 through 1965.  Anyone with known risk factors for hepatitis C. Sexually transmitted infections (STIs)  Get screened for STIs, including gonorrhea and chlamydia, if: ? You are sexually active and are younger than 69 years of age. ? You are older than 69 years of age and your health care provider tells you that you are at risk for this type of infection. ? Your sexual activity has changed since you were last screened, and you are at increased risk for chlamydia or gonorrhea. Ask your health care provider if   you are at risk.  Ask your health care provider about whether you are at high risk for HIV. Your health care provider may recommend a prescription medicine to help prevent HIV infection. If you choose to take medicine to prevent HIV, you should first get tested for HIV. You should then be tested every 3 months for as long as you are taking the medicine. Pregnancy  If you are about to stop having your period (premenopausal) and  you may become pregnant, seek counseling before you get pregnant.  Take 400 to 800 micrograms (mcg) of folic acid every day if you become pregnant.  Ask for birth control (contraception) if you want to prevent pregnancy. Osteoporosis and menopause Osteoporosis is a disease in which the bones lose minerals and strength with aging. This can result in bone fractures. If you are 65 years old or older, or if you are at risk for osteoporosis and fractures, ask your health care provider if you should:  Be screened for bone loss.  Take a calcium or vitamin D supplement to lower your risk of fractures.  Be given hormone replacement therapy (HRT) to treat symptoms of menopause. Follow these instructions at home: Lifestyle  Do not use any products that contain nicotine or tobacco, such as cigarettes, e-cigarettes, and chewing tobacco. If you need help quitting, ask your health care provider.  Do not use street drugs.  Do not share needles.  Ask your health care provider for help if you need support or information about quitting drugs. Alcohol use  Do not drink alcohol if: ? Your health care provider tells you not to drink. ? You are pregnant, may be pregnant, or are planning to become pregnant.  If you drink alcohol: ? Limit how much you use to 0-1 drink a day. ? Limit intake if you are breastfeeding.  Be aware of how much alcohol is in your drink. In the [place] one drink equals one 12 oz bottle of beer (355 mL), one 5 oz glass of wine (148 mL), or one 1 oz glass of hard liquor (44 mL). General instructions  Schedule regular health, dental, and eye exams.  Stay current with your vaccines.  Tell your health care provider if: ? You often feel depressed. ? You have ever been abused or do not feel safe at home. Summary  Adopting a healthy lifestyle and getting preventive care are important in promoting health and wellness.  Follow your health care provider's instructions about healthy  diet, exercising, and getting tested or screened for diseases.  Follow your health care provider's instructions on monitoring your cholesterol and blood pressure. This information is not intended to replace advice given to you by your health care provider. Make sure you discuss any questions you have with your health care provider. Document Released: 05/20/2011 Document Revised: 10/28/2018 Document Reviewed: 10/28/2018 Elsevier Patient Education  2020 [employer]  

## 2019-09-09 NOTE — Assessment & Plan Note (Signed)
Flu shot given. Pneumonia coming back in 1 month to start series. Shingrix counseled. Tetanus due declines. Colonoscopy due 2027. Mammogram due 2021, pap smear aged out and dexa getting later this year, due. Counseled about sun safety and mole surveillance. Counseled about the dangers of distracted driving. Given 10 year screening recommendations.

## 2019-10-01 ENCOUNTER — Other Ambulatory Visit: Payer: Self-pay

## 2019-10-01 ENCOUNTER — Encounter: Payer: Self-pay | Admitting: Internal Medicine

## 2019-10-01 ENCOUNTER — Ambulatory Visit (INDEPENDENT_AMBULATORY_CARE_PROVIDER_SITE_OTHER): Payer: PPO | Admitting: Internal Medicine

## 2019-10-01 DIAGNOSIS — L255 Unspecified contact dermatitis due to plants, except food: Secondary | ICD-10-CM | POA: Diagnosis not present

## 2019-10-01 MED ORDER — METHYLPREDNISOLONE ACETATE 80 MG/ML IJ SUSP
80.0000 mg | Freq: Once | INTRAMUSCULAR | Status: AC
  Administered 2019-10-01: 80 mg via INTRAMUSCULAR

## 2019-10-01 MED ORDER — PREDNISONE 10 MG PO TABS: ORAL_TABLET | ORAL | 1 refills | Status: DC

## 2019-10-01 NOTE — Patient Instructions (Addendum)
You had a steroid injection today.   Start prednisone tomorrow morning.  Take with food.     If you need to repeat the prednisone in a few weeks you can refill it.     Continue the topical ointments, benadryl as needed.

## 2019-10-01 NOTE — Progress Notes (Signed)
Subjective:    Patient ID: Terri Mayer, female    DOB: 20-May-1951, 68 y.o.   MRN: [id number]  HPI The patient is here for an acute visit.  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  She has been experiencing intermittent poison ivy or oak and has been dealing with it.  She has been applying calamine ointment and taking diphenhydramine.  Recently it has gotten worse and she is having a difficult time dealing with it.  She has a lot to clear out and still has a couple weeks left, but the itching is driving her crazy and she needs something for it.  She now has a rash on her arms, legs, chest, neck, abdomen and possibly on her face.  She scratched her eye the other day and has been itching.  She denies any injury inside the eye or changes in vision.  She does not have any fevers or chills.  She does not have any open wounds.  Medications and allergies reviewed with patient and updated if appropriate.  Patient Active Problem List   Diagnosis Date Noted  . Contact dermatitis due to plant 10/01/2019  . Routine general medical examination at a health care facility 01/06/2017  . Malignant neoplasm of upper-inner quadrant of right breast in female, estrogen receptor negative ([place]) 11/30/2015  . Intermittent palpitations 11/30/2015  . Morbid obesity ([place]) 11/30/2015    Current Outpatient Medications on File Prior to Visit  Medication Sig Dispense Refill  . cholecalciferol (VITAMIN D3) 25 MCG (1000 UT) tablet Take 1,000 Units by mouth daily.    . clobetasol cream (TEMOVATE) [id number] % Apply 1 application topically 2 (two) times daily. Thin application on vulva and perianal area BID x 2 weeks, then apply 3 times a week long term. 30 g 4  . cyclobenzaprine (FLEXERIL) 10 MG tablet Take 10 mg by mouth 3 (three) times daily as needed for muscle spasms.    Marland Kitchen ibuprofen (ADVIL,MOTRIN) 200 MG tablet Take 200 mg by mouth every 6 (six) hours as needed for fever or mild pain.  Reported on 03/22/2016    . Multiple Vitamin (MULTIVITAMIN) tablet Take 1 tablet by mouth daily.     No current facility-administered medications on file prior to visit.     Past Medical History:  Diagnosis Date  . Allergy   . Breast cancer ([place])   . History of chicken pox   . Osteoarthritis    Neck,Back,Knees,Hips,Ankles  . Personal history of chemotherapy   . Personal history of radiation therapy     Past Surgical History:  Procedure Laterality Date  . BREAST LUMPECTOMY Right 2017  . DILATION AND CURETTAGE OF UTERUS    . 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 PORTACATH PLACEMENT Right 12/11/2015   Procedure: INSERTION PORT-A-CATH WITH ULTRASOUND;  Surgeon: Rolm Bookbinder, MD;  Location: [facility];  Service: General;  Laterality: Right;  . RADIOACTIVE SEED GUIDED PARTIAL MASTECTOMY/AXILLARY SENTINEL NODE BIOPSY/AXILLARY NODE DISSECTION Right 05/02/2016   Procedure: RIGHT BREAST SEED GUIDED LUMPECTOMY AND RIGHT SEED TARGETED AXILLARY DISSECTION,AND SENTINEL NODE BIOPSY;  Surgeon: Rolm Bookbinder, MD;  Location: [facility];  Service: General;  Laterality: Right;  RIGHT BREAST SEED GUIDED LUMPECTOMY AND RIGHT SEED TARGETED AXILLARY DISSECTION,AND SENTINEL NODE BIOPSY  . WISDOM TOOTH EXTRACTION      Social History   Socioeconomic History  . Marital status: Single    Spouse name: Not  on file  . Number of children: 1  . Years of education: Not on file  . Highest education level: Not on file  Occupational History  . Occupation: Pharmacist, community  Social Needs  . Financial resource strain: Not on file  . Food insecurity    Worry: Not on file    Inability: Not on file  . Transportation needs    Medical: Not on file    Non-medical: Not on file  Tobacco Use  . Smoking status: Never Smoker  . Smokeless tobacco: Never Used  Substance and Sexual Activity  . Alcohol use: Yes    Comment: rarely   . Drug use: Not Currently  . Sexual activity:  Not Currently  Lifestyle  . Physical activity    Days per week: Not on file    Minutes per session: Not on file  . Stress: Not on file  Relationships  . Social Herbalist on phone: Not on file    Gets together: Not on file    Attends religious service: Not on file    Active member of club or organization: Not on file    Attends meetings of clubs or organizations: Not on file    Relationship status: Not on file  Other Topics Concern  . Not on file  Social History Narrative   Single, adopted 1 chil    Family History  Problem Relation Age of Onset  . Heart disease Father   . Diabetes Other        GM  . Dementia Other        M  . Transient ischemic attack Other        F  . Charcot-Marie-Tooth disease Brother   . Colon cancer Neg Hx   . Breast cancer Neg Hx     Review of Systems Per HPI    Objective:   Vitals:   10/01/19 1009  BP: 126/78  Pulse: 95  Resp: 16  Temp: 98.2 F (36.8 C)  SpO2: 99%   BP Readings from Last 3 Encounters:  10/01/19 126/78  09/09/19 112/80  08/18/19 122/83   Wt Readings from Last 3 Encounters:  10/01/19 242 lb (109.8 kg)  09/09/19 254 lb (115.2 kg)  08/18/19 246 lb (111.6 kg)   Body mass index is 38.47 kg/m.   Physical Exam Constitutional:      General: She is not in acute distress.    Appearance: Normal appearance. She is not ill-appearing.  HENT:     Head: Normocephalic and atraumatic.  Pulmonary:     Effort: Pulmonary effort is normal.  Musculoskeletal:     Right lower leg: No edema.     Left lower leg: No edema.  Skin:    General: Skin is warm and dry.     Findings: Rash (Clusters of erythema based small blisters, papules consistent with plant dermatitis.  No open wounds or discharge) present.  Neurological:     Mental Status: She is alert.            Assessment & Plan:    See Problem List for Assessment and Plan of chronic medical problems.

## 2019-10-01 NOTE — Assessment & Plan Note (Signed)
Symptoms and rash consistent with dermatitis secondary to plant-poison ivy versus poison oak most likely Rash is diffuse and has failed treatment with over-the-counter medications Depo-Medrol 80 mg IM x1 Start prednisone taper tomorrow morning Can continue topical antiitch ointments or antihistamines She still has to clear the land and most likely will get this again-I did put a refill on that oral prednisone and she can take that at the end if needed Call with questions or concerns

## 2019-10-08 ENCOUNTER — Ambulatory Visit: Payer: PPO

## 2019-11-05 ENCOUNTER — Other Ambulatory Visit: Payer: PPO

## 2019-12-02 ENCOUNTER — Other Ambulatory Visit: Payer: Self-pay

## 2019-12-02 ENCOUNTER — Ambulatory Visit (INDEPENDENT_AMBULATORY_CARE_PROVIDER_SITE_OTHER): Payer: PPO

## 2019-12-02 DIAGNOSIS — Z299 Encounter for prophylactic measures, unspecified: Secondary | ICD-10-CM

## 2019-12-02 DIAGNOSIS — Z23 Encounter for immunization: Secondary | ICD-10-CM

## 2020-01-17 ENCOUNTER — Ambulatory Visit: Payer: PPO | Attending: Internal Medicine

## 2020-01-17 DIAGNOSIS — Z23 Encounter for immunization: Secondary | ICD-10-CM

## 2020-01-17 NOTE — Progress Notes (Signed)
   Covid-19 Vaccination Clinic  Name:  Terri Mayer    MRN: [id number] DOB: Apr 26, 1951  01/17/2020  [name] was observed post Covid-19 immunization for 15 minutes without incidence. She was provided with Vaccine Information Sheet and instruction to access the V-Safe system.   [name] was instructed to call 911 with any severe reactions post vaccine: Marland Kitchen Difficulty breathing  . Swelling of your face and throat  . A fast heartbeat  . A bad rash all over your body  . Dizziness and weakness    Immunizations Administered    Name Date Dose VIS Date Route   Pfizer COVID-19 Vaccine 01/17/2020 10:37 AM 0.3 mL 10/29/2019 Intramuscular   Manufacturer: [place]   Lot: [id number]   [place]: [id number]

## 2020-02-15 ENCOUNTER — Ambulatory Visit: Payer: PPO | Attending: Internal Medicine

## 2020-02-15 DIAGNOSIS — Z23 Encounter for immunization: Secondary | ICD-10-CM

## 2020-02-15 NOTE — Progress Notes (Signed)
   Covid-19 Vaccination Clinic  Name:  Terri Mayer    MRN: [id number] DOB: [birth year]  02/15/2020  [name] was observed post Covid-19 immunization for 15 minutes without incident. She was provided with Vaccine Information Sheet and instruction to access the V-Safe system.   [name] was instructed to call 911 with any severe reactions post vaccine: Marland Kitchen Difficulty breathing  . Swelling of face and throat  . A fast heartbeat  . A bad rash all over body  . Dizziness and weakness   Immunizations Administered    Name Date Dose VIS Date Route   Pfizer COVID-19 Vaccine 02/15/2020 11:05 AM 0.3 mL 10/29/2019 Intramuscular   Manufacturer: [place]   Lot: [id number]   [place]: [id number]

## 2020-02-23 DIAGNOSIS — H25813 Combined forms of age-related cataract, bilateral: Secondary | ICD-10-CM | POA: Diagnosis not present

## 2020-02-23 DIAGNOSIS — H35373 Puckering of macula, bilateral: Secondary | ICD-10-CM | POA: Diagnosis not present

## 2020-02-23 DIAGNOSIS — H53022 Refractive amblyopia, left eye: Secondary | ICD-10-CM | POA: Diagnosis not present

## 2020-02-23 DIAGNOSIS — H04123 Dry eye syndrome of bilateral lacrimal glands: Secondary | ICD-10-CM | POA: Diagnosis not present

## 2020-03-23 ENCOUNTER — Ambulatory Visit
Admission: RE | Admit: 2020-03-23 | Discharge: 2020-03-23 | Disposition: A | Discharge status: Home / Self Care | Payer: PPO | Source: Ambulatory Visit | Attending: Obstetrics & Gynecology | Admitting: Obstetrics & Gynecology

## 2020-03-23 ENCOUNTER — Other Ambulatory Visit: Payer: Self-pay

## 2020-03-23 DIAGNOSIS — M85851 Other specified disorders of bone density and structure, right thigh: Secondary | ICD-10-CM | POA: Diagnosis not present

## 2020-03-23 DIAGNOSIS — Z780 Asymptomatic menopausal state: Secondary | ICD-10-CM | POA: Diagnosis not present

## 2020-04-20 ENCOUNTER — Other Ambulatory Visit: Payer: Self-pay | Admitting: Oncology

## 2020-04-20 ENCOUNTER — Telehealth: Payer: Self-pay | Admitting: Internal Medicine

## 2020-04-20 DIAGNOSIS — Z853 Personal history of malignant neoplasm of breast: Secondary | ICD-10-CM

## 2020-04-20 DIAGNOSIS — Z1231 Encounter for screening mammogram for malignant neoplasm of breast: Secondary | ICD-10-CM

## 2020-04-20 DIAGNOSIS — Z9889 Other specified postprocedural states: Secondary | ICD-10-CM

## 2020-04-20 NOTE — Telephone Encounter (Signed)
LVM Hep C can be checked by bloodwork

## 2020-04-20 NOTE — Telephone Encounter (Signed)
New Message:       Pt is wanting to know if we do Hep C screenings. Please advise.

## 2020-05-06 ENCOUNTER — Other Ambulatory Visit: Payer: Self-pay | Admitting: Oncology

## 2020-06-05 ENCOUNTER — Other Ambulatory Visit: Payer: Self-pay

## 2020-06-05 ENCOUNTER — Ambulatory Visit
Admission: RE | Admit: 2020-06-05 | Discharge: 2020-06-05 | Disposition: A | Discharge status: Home / Self Care | Payer: PPO | Source: Ambulatory Visit | Attending: Oncology | Admitting: Oncology

## 2020-06-05 DIAGNOSIS — R928 Other abnormal and inconclusive findings on diagnostic imaging of breast: Secondary | ICD-10-CM | POA: Diagnosis not present

## 2020-06-05 DIAGNOSIS — Z853 Personal history of malignant neoplasm of breast: Secondary | ICD-10-CM

## 2020-06-05 DIAGNOSIS — Z9889 Other specified postprocedural states: Secondary | ICD-10-CM

## 2020-07-09 NOTE — Progress Notes (Signed)
[facility]  Telephone:([phone]) [phone] Fax:(336) [phone]   ID: Terri Mayer DOB: Jun 27, 1951  MR#: [id number]  XLK#:[id number]  Patient Care Team: [name], MD as PCP - General (Internal Medicine) Avrum Kimball, Virgie Dad, MD as Consulting Physician (Oncology) Rolm Bookbinder, MD as Consulting Physician (General Surgery) Princess Bruins, MD as Consulting Physician (Obstetrics and Gynecology) Haroldine Laws, Shaune Pascal, MD as Consulting Physician (Cardiology) Laurence Spates, MD (Inactive) as Consulting Physician (Gastroenterology) OTHER MD:  CHIEF COMPLAINT: right breast cancer  CURRENT TREATMENT: Observation   INTERVAL HISTORY: Terri Mayer returns today for follow-up of her right breast cancer. She continues under observation.    Since her last visit, she underwent bilateral diagnostic mammography with tomography at [facility] on 06/05/2020 showing: breast density category B; no evidence of malignancy in either breast.   She also underwent bone density screening on 03/23/2020 showing a T-score of -1.5, which is considered osteopenic.   REVIEW OF SYSTEMS: Ter[facility] and then she got a puppy, Terri Mayer, who is keeping her busy.  She is exercising by gardening.  She has significant back pain and she tells me she has an upcoming appointment with Dr.Jones for evaluation.  Aside from these issues a detailed review of systems today was stable   BREAST CANCER HISTORY: From the original intake note:  "Terri Mayer" underwent bilateral screening mammography with tomosynthesis at the breast center 11/02/2015 showing a possible mass in the right breast and low right axillary area. She was called back for right diagnostic mammography with tomosynthesis and right ultrasonography 11/14/2015. The breast density was category B. There was a lobulated mass in the lateral right breast, with a smaller oval mass immediately adjacent to  it. Also in the upper outer quadrant there was a third mass. This has been stable back to 2014 and is felt to be a fibroadenoma.  Ultrasound of the right breast confirmed a hypoechoic mass at the 3:00 position 6 cm from the nipple, measuring 1.4 cm. Adjacent to this there was a 0.5 cm internal mammary lymph node, with normal morphology and normal hilar fat. The mass at the 10:00 position measured 0.8 cm and again was consistent with a fibroadenoma. In the right axilla there was an abnormal lymph node with no internal fat measuring 1.8 cm. The rest of the exam was unremarkable.  Biopsy of the right breast 8:30 o'clock lesion and the right axillary lymph node on 11/16/2015 showed ([id number]) both to be positive for invasive ductal carcinoma, grade 3, the breast mass being estrogen receptor 10% positive with moderate staining intensity, but progesterone receptor negative, but the lymph node being estrogen and progesterone both negative. Both the breast and lymph nodes had MIB-1 of 30%. Both were positive for HER-2 amplification, the signals ratio is being 6.06 and 7.61, and the number per cell 10.00 and 11.80.  The patient's subsequent history is as detailed below    PAST MEDICAL HISTORY: Past Medical History:  Diagnosis Date  . Allergy   . Breast cancer ([place])   . History of chicken pox   . Osteoarthritis    Neck,Back,Knees,Hips,Ankles  . Personal history of chemotherapy   . Personal history of radiation therapy     PAST SURGICAL HISTORY: Past Surgical History:  Procedure Laterality Date  . BREAST LUMPECTOMY Right 2017  . DILATION AND CURETTAGE OF UTERUS    . EYE SURGERY  at age 26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  .  PORTACATH PLACEMENT Right 12/11/2015   Procedure: INSERTION PORT-A-CATH WITH ULTRASOUND;  Surgeon: Rolm Bookbinder, MD;  Location: [facility];  Service: General;  Laterality: Right;  . RADIOACTIVE SEED GUIDED PARTIAL MASTECTOMY/AXILLARY SENTINEL NODE BIOPSY/AXILLARY  NODE DISSECTION Right 05/02/2016   Procedure: RIGHT BREAST SEED GUIDED LUMPECTOMY AND RIGHT SEED TARGETED AXILLARY DISSECTION,AND SENTINEL NODE BIOPSY;  Surgeon: Rolm Bookbinder, MD;  Location: [facility];  Service: General;  Laterality: Right;  RIGHT BREAST SEED GUIDED LUMPECTOMY AND RIGHT SEED TARGETED AXILLARY DISSECTION,AND SENTINEL NODE BIOPSY  . WISDOM TOOTH EXTRACTION      FAMILY HISTORY Family History  Problem Relation Age of Onset  . Heart disease Father   . Diabetes Other        GM  . Dementia Other        M  . Transient ischemic attack Other        F  . Charcot-Marie-Tooth disease Brother   . Colon cancer Neg Hx   . Breast cancer Neg Hx    The patient's father died after multiple strokes at the age of 9. The patient's mother died at the age of 109 with Alzheimer's disease. The patient had one brother, one sister. The patient's paternal grandfather died from lung cancer in his 32s. On the mother's side there are 2 (second) cousins with breast cancer diagnosed in their early 15s, and a more remote cousin diagnosed with breast cancer at age 9. There is no history of ovarian cancer.    GYNECOLOGIC HISTORY:  No LMP recorded. Patient is postmenopausal.  menarche age 1, the patient is GX P0 and she understands that not carrying a child to term before the age of 7 essentially doubled the risk of breast cancer. She stopped having periods between age 28 and 53 and did not take hormone replacement. 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 She lives by herself, with her toy poodle Terri Mayer.. Her son, adopted at birth, Terri Mayer, graduated from [employer] with a bachelors of Psychology and Sociology in May 2018. 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    ADVANCED DIRECTIVES: Terri Mayer  tells me as of March 2018 her sister Terri Mayer is still her healthcare power of attorney. She can be reached at [phone]. However she is intending to name her son Terri Mayer as her healthcare part of attorney. He can be reached at [phone]. In the second slot she will name her sister-in-law Terri Mayer who is vice president of a hospital, to assist her son. Terri Mayer can be reached at [phone]. The patient's sister will no longer be her healthcare part of attorney.   HEALTH MAINTENANCE: Social History   Tobacco Use  . Smoking status: Never Smoker  . Smokeless tobacco: Never Used  Vaping Use  . Vaping Use: Never used  Substance Use Topics  . Alcohol use: Yes    Comment: rarely   . Drug use: Not Currently     Colonoscopy: 12/28/2004  PAP:  Bone density:  Lipid panel:  Allergies  Allergen Reactions  . Codeine Nausea And Vomiting    Current Outpatient Medications  Medication Sig Dispense Refill  . cholecalciferol (VITAMIN D3) 25 MCG (1000 UT) tablet Take 1,000 Units by mouth daily.    . clobetasol cream (TEMOVATE) 3.66 % Apply 1 application  topically 2 (two) times daily. Thin application on vulva and perianal area BID x 2 weeks, then apply 3 times a week long term. 30 g 4  . cyclobenzaprine (FLEXERIL) 10 MG tablet Take 10 mg by mouth 3 (three) times daily as needed for muscle spasms.    Marland Kitchen ibuprofen (ADVIL,MOTRIN) 200 MG tablet Take 200 mg by mouth every 6 (six) hours as needed for fever or mild pain. Reported on 03/22/2016    . Multiple Vitamin (MULTIVITAMIN) tablet Take 1 tablet by mouth daily.    . predniSONE (DELTASONE) 10 MG tablet Take 4 tabs po qd x 3 days, then 3 tabs po qd x 3 days, then 2 tabs po qd x 3 days, then 1 tab po qd x 3 days 30 tablet 1   No current facility-administered medications for this visit.    OBJECTIVE: White woman in no acute distress  Vitals:   07/10/20 1147  BP: 132/66  Pulse: 98  Resp: 18  Temp: 98.3 F (36.8 C)  SpO2:  98%     Body mass index is 38.22 kg/m.     ECOG FS:0 - Asymptomatic  Sclerae unicteric, EOMs intact Wearing a mask No cervical or supraclavicular adenopathy Lungs no rales or rhonchi Heart regular rate and rhythm Abd soft, nontender, positive bowel sounds MSK some discomfort on spinal exam but no focal tenderness, no upper extremity lymphedema Neuro: nonfocal, well oriented, appropriate affect Breasts: The right breast is status post lumpectomy and radiation.  There is the expected coarsening of the skin.  There is no evidence of local recurrence.  Left breast is benign.  Both axillae are benign.   LAB RESULTS:  CMP     Component Value Date/Time   NA 140 07/08/2019 1313   NA 142 02/13/2017 1136   K 4.4 07/08/2019 1313   K 4.8 02/13/2017 1136   CL 106 07/08/2019 1313   CO2 26 07/08/2019 1313   CO2 34 (H) 02/13/2017 1136   GLUCOSE 116 (H) 07/08/2019 1313   GLUCOSE 92 02/13/2017 1136   BUN 18 07/08/2019 1313   BUN 26.9 (H) 02/13/2017 1136   CREATININE 0.85 07/08/2019 1313   CREATININE 1.0 02/13/2017 1136   CALCIUM 9.3 07/08/2019 1313   CALCIUM 10.5 (H) 02/13/2017 1136   PROT 6.9 07/08/2019 1313   PROT 6.9 02/13/2017 1136   ALBUMIN 3.7 07/08/2019 1313   ALBUMIN 3.9 02/13/2017 1136   AST 18 07/08/2019 1313   AST 21 02/13/2017 1136   ALT 14 07/08/2019 1313   ALT 15 02/13/2017 1136   ALKPHOS 72 07/08/2019 1313   ALKPHOS 69 02/13/2017 1136   BILITOT 0.7 07/08/2019 1313   BILITOT 1.28 (H) 02/13/2017 1136   GFRNONAA >60 07/08/2019 1313   GFRAA >60 07/08/2019 1313    INo results found for: SPEP, UPEP  Lab Results  Component Value Date   WBC 7.9 07/08/2019   NEUTROABS 5.2 07/08/2019   HGB 14.3 07/08/2019   HCT 44.8 07/08/2019   MCV 82.4 07/08/2019   PLT 316 07/08/2019      Chemistry      Component Value Date/Time   NA 140 07/08/2019 1313   NA 142 02/13/2017 1136   K 4.4 07/08/2019 1313   K 4.8 02/13/2017 1136   CL 106 07/08/2019 1313   CO2 26 07/08/2019  1313   CO2 34 (H) 02/13/2017 1136   BUN 18 07/08/2019 1313   BUN 26.9 (H) 02/13/2017 1136   CREATININE 0.85 07/08/2019 1313   CREATININE  1.0 02/13/2017 1136      Component Value Date/Time   CALCIUM 9.3 07/08/2019 1313   CALCIUM 10.5 (H) 02/13/2017 1136   ALKPHOS 72 07/08/2019 1313   ALKPHOS 69 02/13/2017 1136   AST 18 07/08/2019 1313   AST 21 02/13/2017 1136   ALT 14 07/08/2019 1313   ALT 15 02/13/2017 1136   BILITOT 0.7 07/08/2019 1313   BILITOT 1.28 (H) 02/13/2017 1136       No results found for: LABCA2  No components found for: LABCA125  No results for input(s): INR in the last 168 hours.   STUDIES:  No results found.   ASSESSMENT: 69 y.o. [place] woman status post right breast lower outer quadrant and right axillary lymph node biopsy 11/16/2015, also positive for a clinical  T1c pN1, stage IIA invasive ductal carcinoma grade 3, essentially estrogen and progesterone receptor negative but HER-2 amplified, with an MIB-1 of 30%  (a) the breast lesion had moderate positivity for the estrogen receptor at 10%; the axillary lymph node lesion was estrogen receptor negative. Both were progesterone receptor negative  (1) neoadjuvant chemotherapy with carboplatin, docetaxel, trastuzumab and pertuzumab for 6 cycles, started 12/18/2015  (a) docetaxel switched to gemcitabine for cycles 4-6 because of progressive neuropathy  (b) pertuzumab dose cut in half with cycle 4 due to excessive diarrhea, resumed full dose cycle 5  (2) trastuzumab continued to total one year, last dose 12/11/2016  (a) baseline echocardiogram on 12/12/15 showed an EF of 60-65%  (b) echocardiogram 04/19/2016 showed an ejection fraction in the 50-55% range  (c) final echocardiogram 10/22/2016 shows an ejection fraction in the 55-60% range. He  (3) right lumpectomy and sentinel lymph node sampling 05/02/2016 showed a complete pathologic response (ypT0, ypN0)  (4) adjuvant radiation 06/17/3016-07/12/2016 1.  Right breast boost// 7.5 Gy with 3 fractions at a dose of 2.5 Gy/ fraction 2. Right breast// 42.5 Gy with 17 fractions at a dose of 2.5 Gy/ fraction  (5) anastrozole prescribed May 2018 but never started by the patient  (a) initial breast biopsy showed 10% estrogen positivity with moderate staining intensity  (6) radiation recall rash 03/06/2017 --resolved  (7) onychomycosis: on terbinafine without resolution   PLAN: Terri Mayer is now a little over 4 years out from definitive surgery for breast cancer with no evidence of disease recurrence.  This is very favorable.  We discussed exercise issues and she will be returning to swimming as soon as it becomes more practical.  In the meantime of course she is enjoying her new dog.  She will also be seen by surgery for further evaluation of her back discomfort.  She has received the Pfizer vaccine and we discussed whether or not she should receive the booster shot  She will return to see me in 1 year and that may well be her "graduation" visit  She knows to call for any other issue that may develop before then  Total encounter time 25 minutes.*    Lagena Strand, Virgie Dad, MD  07/10/20 12:04 PM Medical Oncology and Hematology [facility] then. [place], [place] [address] Tel. [phone]    Fax. [phone]   I, Wilburn Mylar, am acting as scribe for Dr. Virgie Dad. Kymberley Raz.  I, Lurline Del MD, have reviewed the above documentation for accuracy and completeness, and I agree with the above.    *Total Encounter Time as defined by the Centers for Medicare and Medicaid Services includes, in addition to the face-to-face time of a patient  visit (documented in the note above) non-face-to-face time: obtaining and reviewing outside history, ordering and reviewing medications, tests or procedures, care coordination (communications with other health care professionals or caregivers) and documentation in the medical  record.

## 2020-07-10 ENCOUNTER — Inpatient Hospital Stay: Payer: PPO | Attending: Oncology | Admitting: Oncology

## 2020-07-10 ENCOUNTER — Other Ambulatory Visit: Payer: Self-pay

## 2020-07-10 ENCOUNTER — Inpatient Hospital Stay: Payer: PPO

## 2020-07-10 VITALS — BP 132/66 | HR 98 | Temp 98.3°F | Resp 18 | Ht 66.5 in | Wt 240.4 lb

## 2020-07-10 DIAGNOSIS — C50211 Malignant neoplasm of upper-inner quadrant of right female breast: Secondary | ICD-10-CM | POA: Diagnosis not present

## 2020-07-10 DIAGNOSIS — M1990 Unspecified osteoarthritis, unspecified site: Secondary | ICD-10-CM | POA: Insufficient documentation

## 2020-07-10 DIAGNOSIS — Z171 Estrogen receptor negative status [ER-]: Secondary | ICD-10-CM

## 2020-07-10 DIAGNOSIS — B351 Tinea unguium: Secondary | ICD-10-CM | POA: Diagnosis not present

## 2020-07-10 DIAGNOSIS — Z79811 Long term (current) use of aromatase inhibitors: Secondary | ICD-10-CM | POA: Diagnosis not present

## 2020-07-10 DIAGNOSIS — Z923 Personal history of irradiation: Secondary | ICD-10-CM | POA: Insufficient documentation

## 2020-07-10 DIAGNOSIS — C50511 Malignant neoplasm of lower-outer quadrant of right female breast: Secondary | ICD-10-CM | POA: Diagnosis not present

## 2020-07-10 DIAGNOSIS — Z170 Estrogen receptor positive status [ER+]: Secondary | ICD-10-CM | POA: Insufficient documentation

## 2020-07-10 DIAGNOSIS — Z801 Family history of malignant neoplasm of trachea, bronchus and lung: Secondary | ICD-10-CM | POA: Diagnosis not present

## 2020-07-10 DIAGNOSIS — Z79899 Other long term (current) drug therapy: Secondary | ICD-10-CM | POA: Diagnosis not present

## 2020-07-10 LAB — HEPATITIS C ANTIBODY: HCV Ab: NONREACTIVE

## 2020-07-18 DIAGNOSIS — M542 Cervicalgia: Secondary | ICD-10-CM | POA: Diagnosis not present

## 2020-07-18 DIAGNOSIS — R030 Elevated blood-pressure reading, without diagnosis of hypertension: Secondary | ICD-10-CM | POA: Diagnosis not present

## 2020-07-18 DIAGNOSIS — Z6838 Body mass index (BMI) 38.0-38.9, adult: Secondary | ICD-10-CM | POA: Diagnosis not present

## 2020-07-18 DIAGNOSIS — M546 Pain in thoracic spine: Secondary | ICD-10-CM | POA: Diagnosis not present

## 2020-07-19 ENCOUNTER — Other Ambulatory Visit: Payer: Self-pay | Admitting: Student

## 2020-07-19 DIAGNOSIS — M546 Pain in thoracic spine: Secondary | ICD-10-CM

## 2020-07-19 DIAGNOSIS — M542 Cervicalgia: Secondary | ICD-10-CM

## 2020-08-15 ENCOUNTER — Other Ambulatory Visit: Payer: PPO

## 2020-08-18 ENCOUNTER — Encounter: Payer: PPO | Admitting: Obstetrics & Gynecology

## 2020-08-22 ENCOUNTER — Encounter: Payer: PPO | Admitting: Obstetrics & Gynecology

## 2020-09-01 ENCOUNTER — Encounter: Payer: Self-pay | Admitting: Obstetrics & Gynecology

## 2020-09-01 ENCOUNTER — Ambulatory Visit (INDEPENDENT_AMBULATORY_CARE_PROVIDER_SITE_OTHER): Payer: PPO | Admitting: Obstetrics & Gynecology

## 2020-09-01 ENCOUNTER — Other Ambulatory Visit: Payer: Self-pay

## 2020-09-01 VITALS — BP 138/84 | Ht 66.0 in | Wt 235.0 lb

## 2020-09-01 DIAGNOSIS — Z171 Estrogen receptor negative status [ER-]: Secondary | ICD-10-CM | POA: Diagnosis not present

## 2020-09-01 DIAGNOSIS — Z9289 Personal history of other medical treatment: Secondary | ICD-10-CM | POA: Diagnosis not present

## 2020-09-01 DIAGNOSIS — Z23 Encounter for immunization: Secondary | ICD-10-CM

## 2020-09-01 DIAGNOSIS — Z6837 Body mass index (BMI) 37.0-37.9, adult: Secondary | ICD-10-CM | POA: Diagnosis not present

## 2020-09-01 DIAGNOSIS — C50211 Malignant neoplasm of upper-inner quadrant of right female breast: Secondary | ICD-10-CM | POA: Diagnosis not present

## 2020-09-01 DIAGNOSIS — E6609 Other obesity due to excess calories: Secondary | ICD-10-CM

## 2020-09-01 DIAGNOSIS — M85851 Other specified disorders of bone density and structure, right thigh: Secondary | ICD-10-CM | POA: Diagnosis not present

## 2020-09-01 DIAGNOSIS — Z780 Asymptomatic menopausal state: Secondary | ICD-10-CM | POA: Diagnosis not present

## 2020-09-01 DIAGNOSIS — Z01419 Encounter for gynecological examination (general) (routine) without abnormal findings: Secondary | ICD-10-CM

## 2020-09-01 DIAGNOSIS — N904 Leukoplakia of vulva: Secondary | ICD-10-CM | POA: Diagnosis not present

## 2020-09-01 NOTE — Progress Notes (Signed)
Terri Mayer 11-22-1950 [id number]   History:    70 y.o.  G0 Divorced, single.  Retired Pharmacist, community.  RP:  Established patient presenting for annual gyn exam   HPI: Postmenopausal, well on no hormone replacement therapy.  No postmenopausal bleeding.  No pelvic pain.  Vulvar itching and discomfort associated with Lichen Sclerosus.  Abstinent.  History of Rt Invasive Ductal Breast Ca in 2016.  Partial Rt breast Mastectomy and Radiation Therapy, followed by Dr Jana Hakim.  BMI 37.93.  Needs to increase physical activity.  Health labs with Fam MD.  [name] 2017.  BD 03/2020 Osteopenia Rt Fem Neck -1.5.  Past medical history,surgical history, family history and social history were all reviewed and documented in the EPIC chart.  Gynecologic History No LMP recorded. Patient is postmenopausal.  Obstetric History OB History  Gravida Para Term Preterm AB Living  0 0 0 0 0 0  SAB TAB Ectopic Multiple Live Births  0 0 0 0 0     ROS: A ROS was performed and pertinent positives and negatives are included in the history.  GENERAL: No fevers or chills. HEENT: No change in vision, no earache, sore throat or sinus congestion. NECK: No pain or stiffness. CARDIOVASCULAR: No chest pain or pressure. No palpitations. PULMONARY: No shortness of breath, cough or wheeze. GASTROINTESTINAL: No abdominal pain, nausea, vomiting or diarrhea, melena or bright red blood per rectum. GENITOURINARY: No urinary frequency, urgency, hesitancy or dysuria. MUSCULOSKELETAL: No joint or muscle pain, no back pain, no recent trauma. DERMATOLOGIC: No rash, no itching, no lesions. ENDOCRINE: No polyuria, polydipsia, no heat or cold intolerance. No recent change in weight. HEMATOLOGICAL: No anemia or easy bruising or bleeding. NEUROLOGIC: No headache, seizures, numbness, tingling or weakness. PSYCHIATRIC: No depression, no loss of interest in normal activity or change in sleep pattern.     Exam:   BP 138/84   Ht 5\' 6"  (1.676 m)    Wt 235 lb (106.6 kg)   BMI 37.93 kg/m   Body mass index is 37.93 kg/m.  General appearance : Well developed well nourished female. No acute distress HEENT: Eyes: no retinal hemorrhage or exudates,  Neck supple, trachea midline, no carotid bruits, no thyroidmegaly Lungs: Clear to auscultation, no rhonchi or wheezes, or rib retractions  Heart: Regular rate and rhythm, no murmurs or gallops Breast:Examined in sitting and supine position were symmetrical in appearance, no palpable masses or tenderness,  no skin retraction, no nipple inversion, no nipple discharge, no skin discoloration, no axillary or supraclavicular lymphadenopathy Abdomen: no palpable masses or tenderness, no rebound or guarding Extremities: no edema or skin discoloration or tenderness  Pelvic: Vulva: Normal             Vagina: No gross lesions or discharge  Cervix: No gross lesions or discharge.  Pap reflex done  Uterus  AV, normal size, shape and consistency, non-tender and mobile  Adnexa  Without masses or tenderness  Anus: Normal   Assessment/Plan:  69 y.o. female for annual exam   1. Encounter for routine gynecological examination with Papanicolaou smear of cervix Normal gynecologic exam.  Pap reflex done.  Left breast exam normal, status post partial right mastectomy for right breast cancer.  Screening mammogram July 2021 was negative.  Colonoscopy 2017.  Health labs with family physician.  2. Postmenopause Well on no hormone replacement therapy.  No postmenopausal bleeding.  3. Osteopenia of neck of right femur Osteopenia on bone density May 2021 with a T score of -1.5  at the right femoral neck.  Vitamin D supplements, calcium intake of 1500 mg daily and regular weightbearing physical activity is recommended.  4. Lichen sclerosus et atrophicus of the vulva Lichen sclerosus of the vulva mildly symptomatic currently as patient is not using her clobetasol cream as recommended and prescribed.  Recommend  restarting on clobetasol cream a thin application on the vulva twice a day for 2 weeks, then decrease to 3 times a week and finally continue long-term twice a week.  Patient does not need a represcription at this time.  5. Malignant neoplasm of upper-inner quadrant of right breast in female, estrogen receptor negative ([place]) History of right breast cancer, status post right partial mastectomy.  Screening mammogram July 2021 was negative.  6. Class 2 obesity due to excess calories without serious comorbidity with body mass index (BMI) of 37.0 to 37.9 in adult Recommend a low calorie/carb diet.  Aerobic activities 5 times a week and light weightlifting every 2 days.  Princess Bruins MD, 10:28 AM 09/01/2020

## 2020-09-04 LAB — PAP IG W/ RFLX HPV ASCU

## 2020-09-11 ENCOUNTER — Encounter: Payer: PPO | Admitting: Internal Medicine

## 2020-09-14 ENCOUNTER — Other Ambulatory Visit: Payer: PPO

## 2020-09-20 ENCOUNTER — Encounter: Payer: PPO | Admitting: Obstetrics & Gynecology

## 2020-09-28 ENCOUNTER — Encounter: Payer: PPO | Admitting: Internal Medicine

## 2020-11-13 ENCOUNTER — Encounter: Payer: PPO | Admitting: Internal Medicine

## 2020-12-01 ENCOUNTER — Other Ambulatory Visit: Payer: Self-pay

## 2020-12-01 ENCOUNTER — Ambulatory Visit (INDEPENDENT_AMBULATORY_CARE_PROVIDER_SITE_OTHER): Payer: PPO | Admitting: General Practice

## 2020-12-01 DIAGNOSIS — Z23 Encounter for immunization: Secondary | ICD-10-CM

## 2020-12-06 ENCOUNTER — Telehealth: Payer: Self-pay | Admitting: Internal Medicine

## 2020-12-06 NOTE — Telephone Encounter (Signed)
Patient called and was wondering when she could get a her Covid 51 Booster shot. She recently got her   Pneumococcal Polysaccharide-23 12/01/2020   And she was also wondering about her shingles vaccine. She can be reached at [phone].

## 2020-12-06 NOTE — Telephone Encounter (Signed)
Spoke with the patient and advised her that providers typically prefer patients to wait 2 weeks in between vaccines. She verbalized understanding. No other questions or concerns at this time.

## 2021-01-24 IMAGING — MG DIGITAL DIAGNOSTIC BILAT W/ TOMO W/ CAD
6 of 10 series · 6 of 26 positions shown · non-contrast
Comparison: Previous exam(s).

CLINICAL DATA: Malignant lumpectomy of the OUTER RIGHT breast in
ductal carcinoma with lymph node metastasis. Adjuvant radiation
therapy. Annual evaluation.

EXAM:
DIGITAL DIAGNOSTIC BILATERAL MAMMOGRAM WITH TOMO AND CAD

[R CC (1 of 2)]
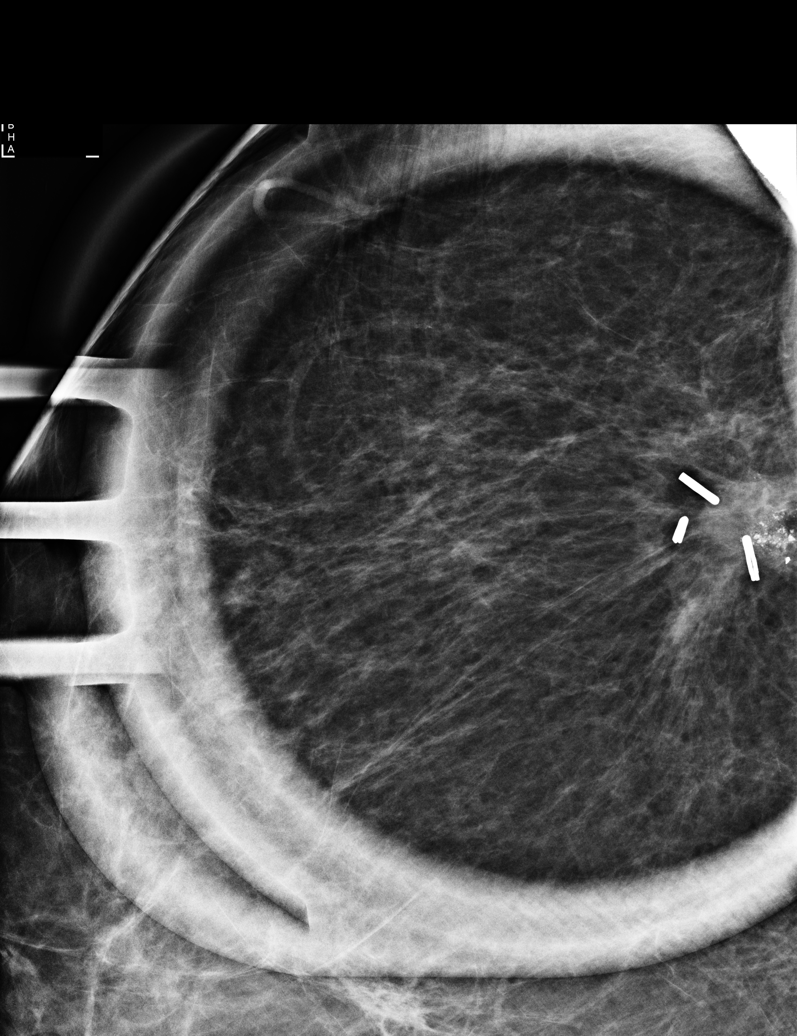

[R CC (2 of 2)]
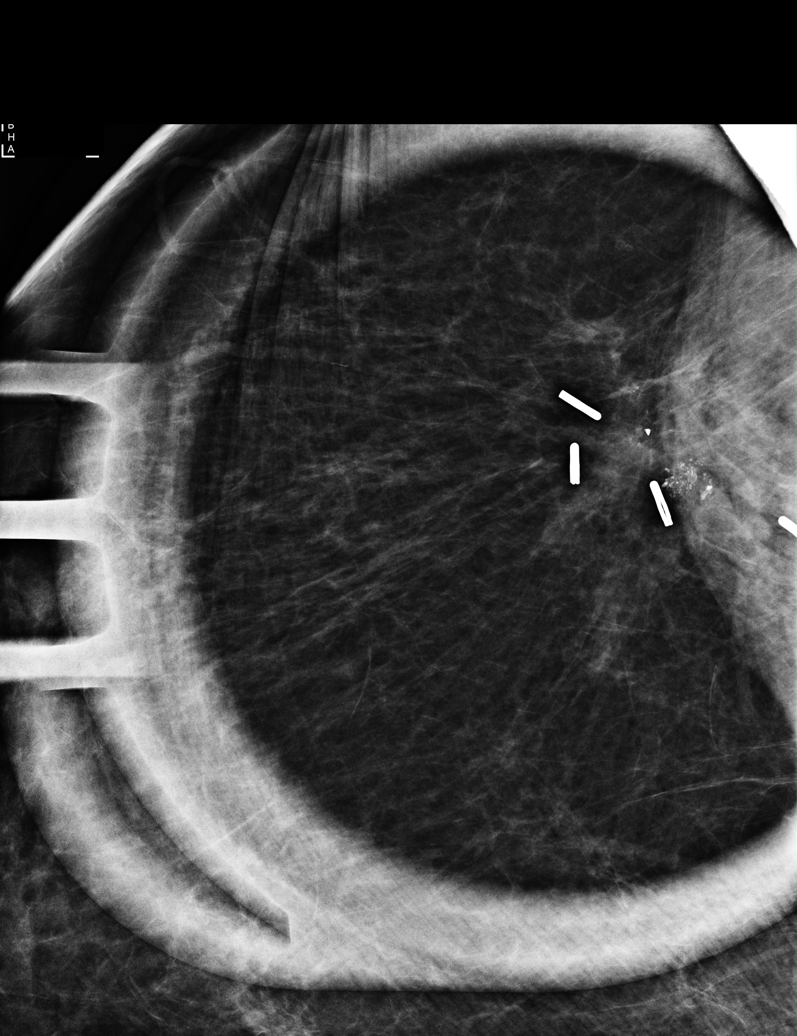

[R CC synth-2D]
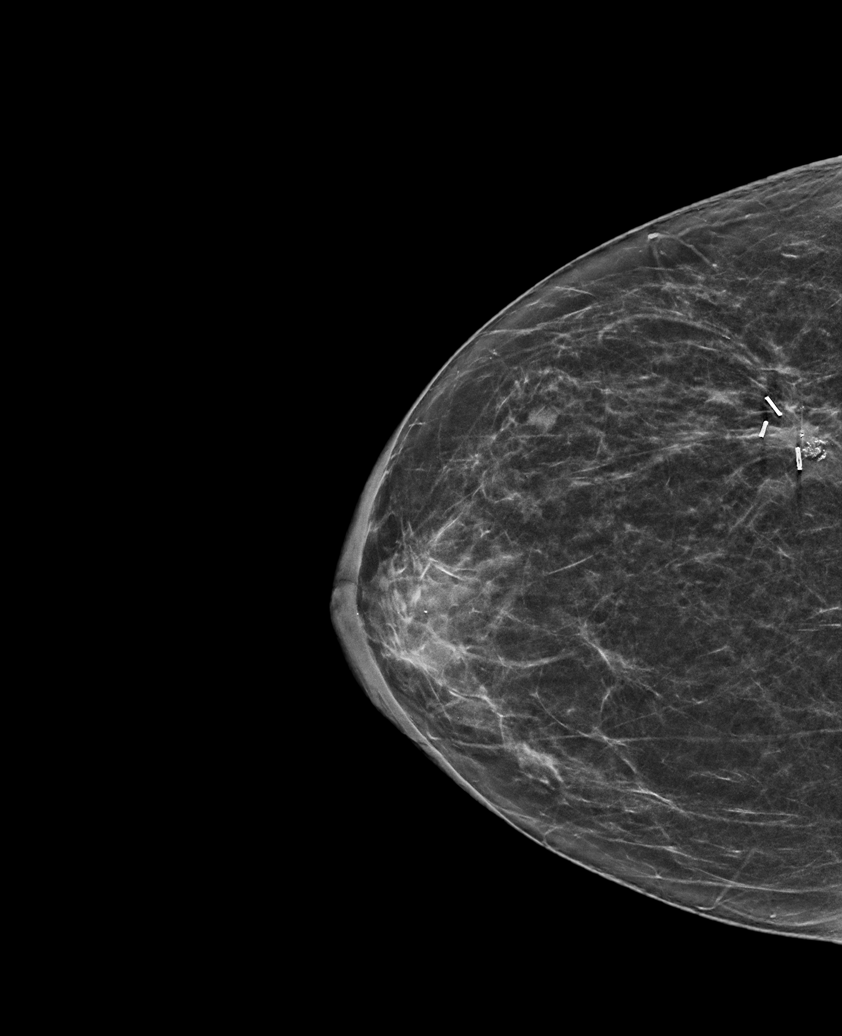

[R MLO synth-2D]
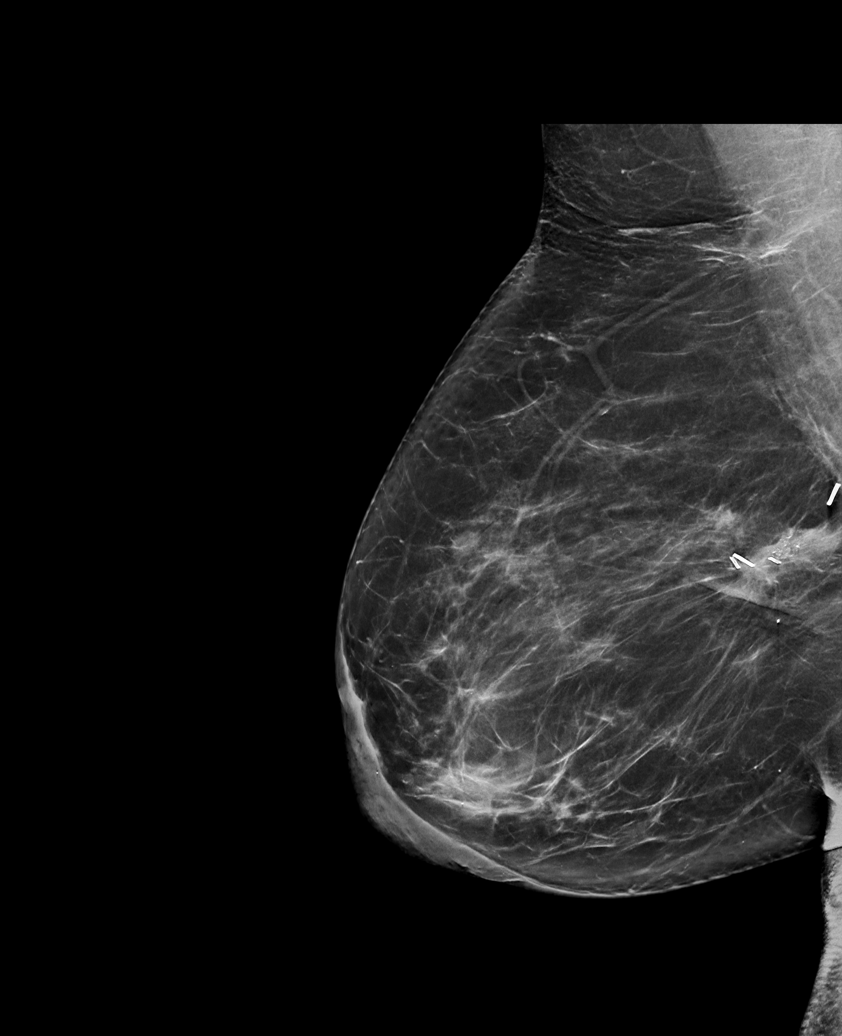

[L CC synth-2D]
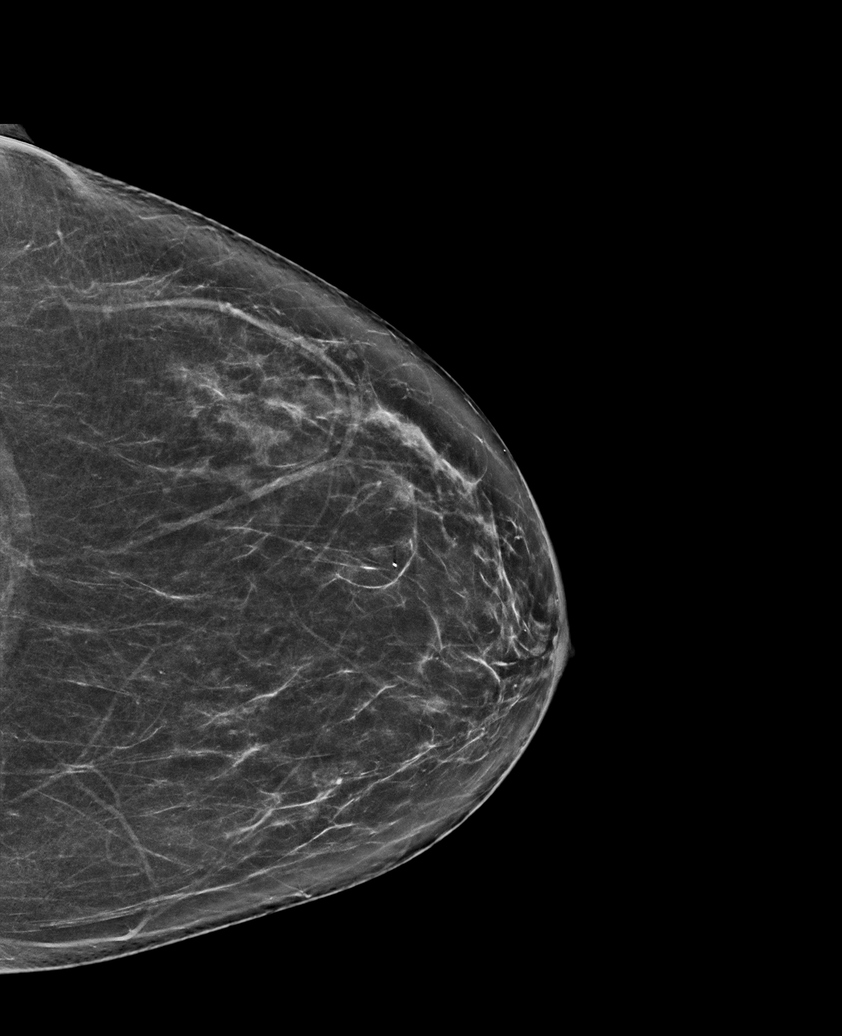

[L MLO synth-2D]
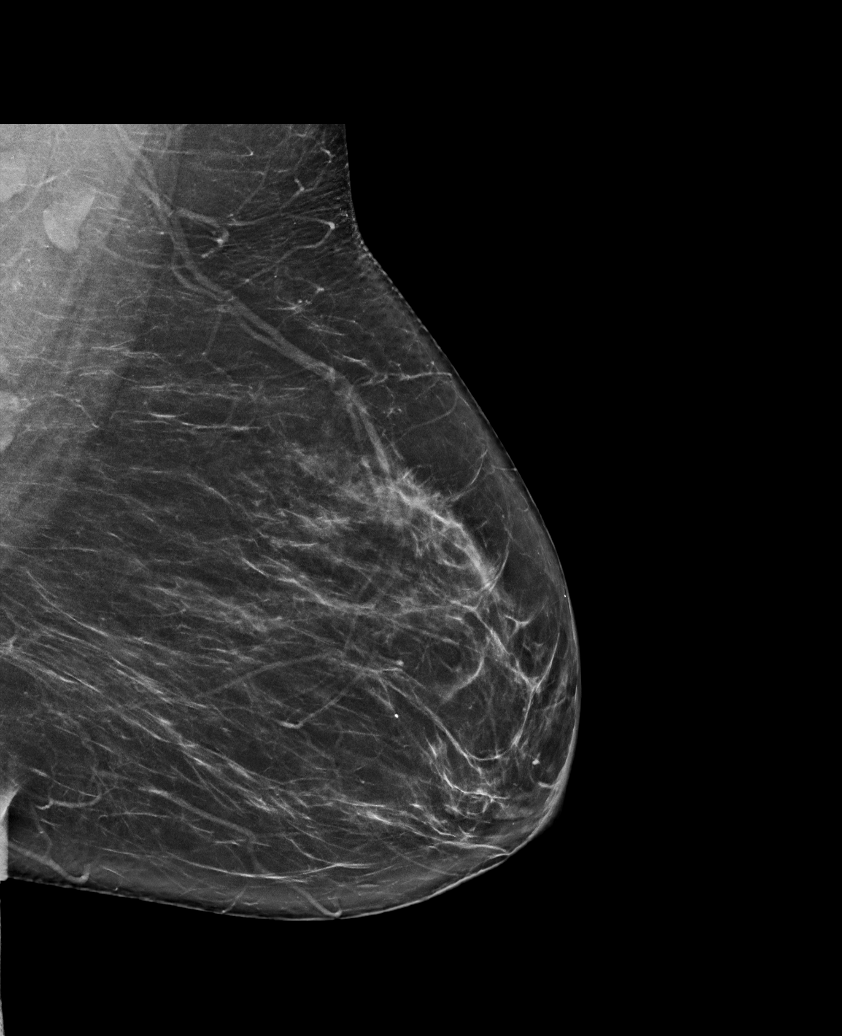

[6 of 26 positions shown; findings below may reference images not displayed]

ACR Breast Density Category b: There are scattered areas of
fibroglandular density.
FINDINGS: Tomosynthesis and synthesized full field CC and MLO views of both
breasts were obtained. Standard spot magnification CC view of the
lumpectomy site in the RIGHT breast was also obtained.

Post surgical scar/architectural distortion at the lumpectomy site
in the OUTER RIGHT breast at POSTERIOR depth, associated with benign
oil cysts with dystrophic calcifications. Residual mild post
radiation skin thickening and trabecular thickening, unchanged since
the mammogram 1 year ago. No findings suspicious for malignancy in
the RIGHT breast.

No findings suspicious for malignancy in the LEFT breast.

Mammographic images were processed with CAD.
IMPRESSION: 1. No mammographic evidence of malignancy involving either breast.
2. Expected post lumpectomy changes and residual post radiation
changes involving the RIGHT breast.

RECOMMENDATION:
BILATERAL diagnostic mammography in 1 year.

I have discussed the findings and recommendations with the patient.
If applicable, a reminder letter will be sent to the patient
regarding the next appointment.

BI-RADS CATEGORY  2: Benign.

## 2021-05-03 ENCOUNTER — Telehealth: Payer: Self-pay | Admitting: *Deleted

## 2021-05-03 ENCOUNTER — Other Ambulatory Visit: Payer: Self-pay | Admitting: Oncology

## 2021-05-03 NOTE — Telephone Encounter (Signed)
This RN spoke with pt per her call stating she called to schedule her yearly mammogram - which this year she qualifies for a screening mammogram.  Per questions asked- she informed them of noted hardening of right nipple- mammogram could not be scheduled and she was told to contact this office for possible diagnostic mammogram.  Per discussion- [name]- states above is adjacent to her surgical site- which has known scar tissue noted on prior mammograms " I think it is scar tissue "  Per discussion and need for appropriate order- pt will come in for evaluation by mid level tomorrow - appt made by this RN.

## 2021-05-04 ENCOUNTER — Inpatient Hospital Stay: Payer: PPO | Attending: Adult Health | Admitting: Adult Health

## 2021-05-04 ENCOUNTER — Encounter: Payer: Self-pay | Admitting: Adult Health

## 2021-05-04 ENCOUNTER — Other Ambulatory Visit: Payer: Self-pay

## 2021-05-04 VITALS — BP 123/68 | HR 100 | Temp 97.6°F | Resp 18 | Ht 66.0 in | Wt 235.4 lb

## 2021-05-04 DIAGNOSIS — Z170 Estrogen receptor positive status [ER+]: Secondary | ICD-10-CM | POA: Diagnosis not present

## 2021-05-04 DIAGNOSIS — Z9221 Personal history of antineoplastic chemotherapy: Secondary | ICD-10-CM | POA: Diagnosis not present

## 2021-05-04 DIAGNOSIS — Z923 Personal history of irradiation: Secondary | ICD-10-CM | POA: Diagnosis not present

## 2021-05-04 DIAGNOSIS — C773 Secondary and unspecified malignant neoplasm of axilla and upper limb lymph nodes: Secondary | ICD-10-CM | POA: Insufficient documentation

## 2021-05-04 DIAGNOSIS — M1990 Unspecified osteoarthritis, unspecified site: Secondary | ICD-10-CM | POA: Insufficient documentation

## 2021-05-04 DIAGNOSIS — Z171 Estrogen receptor negative status [ER-]: Secondary | ICD-10-CM

## 2021-05-04 DIAGNOSIS — C50511 Malignant neoplasm of lower-outer quadrant of right female breast: Secondary | ICD-10-CM | POA: Diagnosis not present

## 2021-05-04 DIAGNOSIS — C50211 Malignant neoplasm of upper-inner quadrant of right female breast: Secondary | ICD-10-CM | POA: Diagnosis not present

## 2021-05-04 DIAGNOSIS — Z79899 Other long term (current) drug therapy: Secondary | ICD-10-CM | POA: Insufficient documentation

## 2021-05-04 DIAGNOSIS — B351 Tinea unguium: Secondary | ICD-10-CM | POA: Diagnosis not present

## 2021-05-04 DIAGNOSIS — G629 Polyneuropathy, unspecified: Secondary | ICD-10-CM | POA: Insufficient documentation

## 2021-05-04 NOTE — Progress Notes (Signed)
[name]  [phone] [phone]   ID: Terri Mayer DOB: 1951-01-29  MR#: [id number]  QQV#:[id number]  Patient Care Team: [name], MD as PCP - General (Internal Medicine) Magrinat, Virgie Dad, MD as Consulting Physician (Oncology) Rolm Bookbinder, MD as Consulting Physician (General Surgery) Princess Bruins, MD as Consulting Physician (Obstetrics and Gynecology) Haroldine Laws, Shaune Pascal, MD as Consulting Physician (Cardiology) Laurence Spates, MD (Inactive) as Consulting Physician (Gastroenterology) OTHER MD:  CHIEF COMPLAINT: right breast cancer  CURRENT TREATMENT: Observation   INTERVAL HISTORY: Terri Mayer returns today for follow-up of her right breast cancer. She continues under observation.    Since her last visit, she underwent bilateral diagnostic mammography with tomography at [facility] on 06/05/2020 showing: breast density category B; no evidence of malignancy in either breast.   Terri Mayer notes that the breast center had updated its recommendations for her mammogram from diagnostic to screening, however Terri Mayer noted a change in her breast where at the lumpectomy site it feels firmer than it previously did.  They asked that she f/u with [place] to determine what mammogram needs to be ordered.    [name] notes she is doing well otherwise and has no pain, redness, warmth in her breasts.  She isn't sure if what she is feeling is something to be concerned about or not.    REVIEW OF SYSTEMS: Review of Systems  Constitutional:  Negative for appetite change, chills, fatigue, fever and unexpected weight change.  HENT:   Negative for hearing loss, lump/mass and trouble swallowing.   Eyes:  Negative for eye problems and icterus.  Respiratory:  Negative for chest tightness, cough and shortness of breath.   Cardiovascular:  Negative for chest pain, leg swelling and palpitations.  Gastrointestinal:  Negative for abdominal distention, abdominal  pain, constipation, diarrhea, nausea and vomiting.  Endocrine: Negative for hot flashes.  Genitourinary:  Negative for difficulty urinating.   Musculoskeletal:  Negative for arthralgias.  Skin:  Negative for itching and rash.  Neurological:  Negative for dizziness, extremity weakness, headaches and numbness.  Hematological:  Negative for adenopathy. Does not bruise/bleed easily.  Psychiatric/Behavioral:  Negative for depression. The patient is not nervous/anxious.      BREAST CANCER HISTORY: From the original intake note:  "Terri Mayer" underwent bilateral screening mammography with tomosynthesis at the breast center 11/02/2015 showing a possible mass in the right breast and low right axillary area. She was called back for right diagnostic mammography with tomosynthesis and right ultrasonography 11/14/2015. The breast density was category B. There was a lobulated mass in the lateral right breast, with a smaller oval mass immediately adjacent to it. Also in the upper outer quadrant there was a third mass. This has been stable back to 2014 and is felt to be a fibroadenoma.  Ultrasound of the right breast confirmed a hypoechoic mass at the 3:00 position 6 cm from the nipple, measuring 1.4 cm. Adjacent to this there was a 0.5 cm internal mammary lymph node, with normal morphology and normal hilar fat. The mass at the 10:00 position measured 0.8 cm and again was consistent with a fibroadenoma. In the right axilla there was an abnormal lymph node with no internal fat measuring 1.8 cm. The rest of the exam was unremarkable.  Biopsy of the right breast 8:30 o'clock lesion and the right axillary lymph node on 11/16/2015 showed ([id number]) both to be positive for invasive ductal carcinoma, grade 3, the breast mass being estrogen receptor 10% positive with moderate staining intensity,  but progesterone receptor negative, but the lymph node being estrogen and progesterone both negative. Both the breast and lymph  nodes had MIB-1 of 30%. Both were positive for HER-2 amplification, the signals ratio is being 6.06 and 7.61, and the number per cell 10.00 and 11.80.  The patient's subsequent history is as detailed below    PAST MEDICAL HISTORY: Past Medical History:  Diagnosis Date   Allergy    Breast cancer ([place])    History of chicken pox    Osteoarthritis    Neck,Back,Knees,Hips,Ankles   Personal history of chemotherapy    Personal history of radiation therapy     PAST SURGICAL HISTORY: Past Surgical History:  Procedure Laterality Date   BREAST LUMPECTOMY Right 2017   DILATION AND CURETTAGE OF UTERUS     EYE SURGERY  at age 72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   PORTACATH PLACEMENT Right 12/11/2015   Procedure: INSERTION PORT-A-CATH WITH ULTRASOUND;  Surgeon: Rolm Bookbinder, MD;  Location: [facility];  Service: General;  Laterality: Right;   RADIOACTIVE SEED GUIDED PARTIAL MASTECTOMY/AXILLARY SENTINEL NODE BIOPSY/AXILLARY NODE DISSECTION Right 05/02/2016   Procedure: RIGHT BREAST SEED GUIDED LUMPECTOMY AND RIGHT SEED TARGETED AXILLARY DISSECTION,AND SENTINEL NODE BIOPSY;  Surgeon: Rolm Bookbinder, MD;  Location: [facility];  Service: General;  Laterality: Right;  RIGHT BREAST SEED GUIDED LUMPECTOMY AND RIGHT SEED TARGETED AXILLARY DISSECTION,AND SENTINEL NODE BIOPSY   WISDOM TOOTH EXTRACTION      FAMILY HISTORY Family History  Problem Relation Age of Onset   Heart disease Father    Diabetes Other        GM   Dementia Other        M   Transient ischemic attack Other        F   Charcot-Marie-Tooth disease Brother    Colon cancer Neg Hx    Breast cancer Neg Hx    The patient's father died after multiple strokes at the age of 71. The patient's mother died at the age of 31 with Alzheimer's disease. The patient had one brother, one sister. The patient's paternal grandfather died from lung cancer in his 79s. On the mother's side there are 2 (second) cousins with  breast cancer diagnosed in their early 26s, and a more remote cousin diagnosed with breast cancer at age 25. There is no history of ovarian cancer.    GYNECOLOGIC HISTORY:  No LMP recorded. Patient is postmenopausal.  menarche age 81, the patient is GX P0 and she understands that not carrying a child to term before the age of 32 essentially doubled the risk of breast cancer. She stopped having periods between age 57 and 61 and did not take hormone replacement. 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 She lives by herself, with her toy poodle Curtiss.. Her son, adopted at birth, Ema Hebner, graduated from [employer] with a bachelors of Psychology and Sociology in May 2018. [name] is currently working for [employer] had an Administrator, arts IT position.    ADVANCED DIRECTIVES: Dameshia tells me as of March 2018 her sister [name] in [place] is still her healthcare power of attorney. She can be reached at [phone]. However she is intending to name her son [name] as her healthcare part of attorney. He can be reached at [phone]. In the second slot she will name  her sister-in-law [name] who is vice president of a hospital, to assist her son. [name] can be reached at [phone]. The patient's sister will no longer be her healthcare part of attorney.   HEALTH MAINTENANCE: Social History   Tobacco Use   Smoking status: Never   Smokeless tobacco: Never  Vaping Use   Vaping Use: Never used  Substance Use Topics   Alcohol use: Yes    Comment: rarely    Drug use: Not Currently     Colonoscopy: 12/28/2004  PAP:  Bone density:  Lipid panel:  Allergies  Allergen Reactions   Codeine Nausea And Vomiting    Current Outpatient Medications  Medication Sig Dispense Refill   cholecalciferol (VITAMIN D3) 25 MCG (1000 UT) tablet  Take 1,000 Units by mouth daily.     cyclobenzaprine (FLEXERIL) 10 MG tablet Take 10 mg by mouth 3 (three) times daily as needed for muscle spasms.     ibuprofen (ADVIL,MOTRIN) 200 MG tablet Take 200 mg by mouth every 6 (six) hours as needed for fever or mild pain. Reported on 03/22/2016     Multiple Vitamin (MULTIVITAMIN) tablet Take 1 tablet by mouth daily.     No current facility-administered medications for this visit.    OBJECTIVE: White woman in no acute distress  Vitals:   05/04/21 1153  BP: 123/68  Pulse: 100  Resp: 18  Temp: 97.6 F (36.4 C)  SpO2: 97%     Body mass index is 37.99 kg/m.     ECOG FS:0 - Asymptomatic GENERAL: Patient is a well appearing female in no acute distress HEENT:  Sclerae anicteric.  Oropharynx clear and moist. No ulcerations or evidence of oropharyngeal candidiasis. Neck is supple.  NODES:  No cervical, supraclavicular, or axillary lymphadenopathy palpated.  BREAST EXAM:  right breast s/p lumpectomy and radiation.  Thickening present, no sign of recurrence, left breast benign LUNGS:  Clear to auscultation bilaterally.  No wheezes or rhonchi. HEART:  Regular rate and rhythm. No murmur appreciated. ABDOMEN:  Soft, nontender.  Positive, normoactive bowel sounds. No organomegaly palpated. MSK:  No focal spinal tenderness to palpation. Full range of motion bilaterally in the upper extremities. EXTREMITIES:  No peripheral edema.   SKIN:  Clear with no obvious rashes or skin changes. No nail dyscrasia. NEURO:  Nonfocal. Well oriented.  Appropriate affect.   LAB RESULTS:  CMP     Component Value Date/Time   NA 140 07/08/2019 1313   NA 142 02/13/2017 1136   K 4.4 07/08/2019 1313   K 4.8 02/13/2017 1136   CL 106 07/08/2019 1313   CO2 26 07/08/2019 1313   CO2 34 (H) 02/13/2017 1136   GLUCOSE 116 (H) 07/08/2019 1313   GLUCOSE 92 02/13/2017 1136   BUN 18 07/08/2019 1313   BUN 26.9 (H) 02/13/2017 1136   CREATININE 0.85 07/08/2019 1313   CREATININE  1.0 02/13/2017 1136   CALCIUM 9.3 07/08/2019 1313   CALCIUM 10.5 (H) 02/13/2017 1136   PROT 6.9 07/08/2019 1313   PROT 6.9 02/13/2017 1136   ALBUMIN 3.7 07/08/2019 1313   ALBUMIN 3.9 02/13/2017 1136   AST 18 07/08/2019 1313   AST 21 02/13/2017 1136   ALT 14 07/08/2019 1313   ALT 15 02/13/2017 1136   ALKPHOS 72 07/08/2019 1313   ALKPHOS 69 02/13/2017 1136   BILITOT 0.7 07/08/2019 1313   BILITOT 1.28 (H) 02/13/2017 1136   GFRNONAA >60 07/08/2019 1313   GFRAA >60 07/08/2019 1313    INo results found  for: SPEP, UPEP  Lab Results  Component Value Date   WBC 7.9 07/08/2019   NEUTROABS 5.2 07/08/2019   HGB 14.3 07/08/2019   HCT 44.8 07/08/2019   MCV 82.4 07/08/2019   PLT 316 07/08/2019      Chemistry      Component Value Date/Time   NA 140 07/08/2019 1313   NA 142 02/13/2017 1136   K 4.4 07/08/2019 1313   K 4.8 02/13/2017 1136   CL 106 07/08/2019 1313   CO2 26 07/08/2019 1313   CO2 34 (H) 02/13/2017 1136   BUN 18 07/08/2019 1313   BUN 26.9 (H) 02/13/2017 1136   CREATININE 0.85 07/08/2019 1313   CREATININE 1.0 02/13/2017 1136      Component Value Date/Time   CALCIUM 9.3 07/08/2019 1313   CALCIUM 10.5 (H) 02/13/2017 1136   ALKPHOS 72 07/08/2019 1313   ALKPHOS 69 02/13/2017 1136   AST 18 07/08/2019 1313   AST 21 02/13/2017 1136   ALT 14 07/08/2019 1313   ALT 15 02/13/2017 1136   BILITOT 0.7 07/08/2019 1313   BILITOT 1.28 (H) 02/13/2017 1136       No results found for: LABCA2  No components found for: LABCA125  No results for input(s): INR in the last 168 hours.   STUDIES:  No results found.   ASSESSMENT: 70 y.o. [place] woman status post right breast lower outer quadrant and right axillary lymph node biopsy 11/16/2015, also positive for a clinical  T1c pN1, stage IIA invasive ductal carcinoma grade 3, essentially estrogen and progesterone receptor negative but HER-2 amplified, with an MIB-1 of 30%  (a) the breast lesion had moderate positivity for the  estrogen receptor at 10%; the axillary lymph node lesion was estrogen receptor negative. Both were progesterone receptor negative  (1) neoadjuvant chemotherapy with carboplatin, docetaxel, trastuzumab and pertuzumab for 6 cycles, started 12/18/2015  (a) docetaxel switched to gemcitabine for cycles 4-6 because of progressive neuropathy  (b) pertuzumab dose cut in half with cycle 4 due to excessive diarrhea, resumed full dose cycle 5  (2) trastuzumab continued to total one year, last dose 12/11/2016  (a) baseline echocardiogram on 12/12/15 showed an EF of 60-65%  (b) echocardiogram 04/19/2016 showed an ejection fraction in the 50-55% range  (c) final echocardiogram 10/22/2016 shows an ejection fraction in the 55-60% range. He  (3) right lumpectomy and sentinel lymph node sampling 05/02/2016 showed a complete pathologic response (ypT0, ypN0)  (4) adjuvant radiation 06/17/3016-07/12/2016  1. Right breast boost// 7.5 Gy with 3 fractions at a dose of 2.5 Gy/ fraction 2. Right breast// 42.5 Gy with 17 fractions at a dose of 2.5 Gy/ fraction  (5) anastrozole prescribed May 2018 but never started by the patient  (a) initial breast biopsy showed 10% estrogen positivity with moderate staining intensity  (6) radiation recall rash 03/06/2017 --resolved  (7) onychomycosis: on terbinafine without resolution   PLAN:  Terri Mayer is here today for f/u of breast change prior to having her mammogram reordered that is due in July, 2022.  I placed orders for this for her to have completed as diagnostic mammogram.  There is no definite change I can discern on her breast exam, however since she believes there is a difference, we should fully evaluate it with diagnostic mammogram.  I also placed ultrasound of the breast.    Terri Mayer understands the above.  She will see Dr. Jana Hakim in August to f/u and review the results.    She knows to call for any  questions that may arise between now and her next appointment.  We are  happy to see her sooner if needed.    Total encounter time 20 minutes.Wilber Bihari, NP 05/04/21 12:06 PM Medical Oncology and Hematology [facility] [place], [place] [address] Tel. [phone]    Fax. [phone]     *Total Encounter Time as defined by the Centers for Medicare and Medicaid Services includes, in addition to the face-to-face time of a patient visit (documented in the note above) non-face-to-face time: obtaining and reviewing outside history, ordering and reviewing medications, tests or procedures, care coordination (communications with other health care professionals or caregivers) and documentation in the medical record.

## 2021-05-08 DIAGNOSIS — H04123 Dry eye syndrome of bilateral lacrimal glands: Secondary | ICD-10-CM | POA: Diagnosis not present

## 2021-05-08 DIAGNOSIS — H25813 Combined forms of age-related cataract, bilateral: Secondary | ICD-10-CM | POA: Diagnosis not present

## 2021-05-08 DIAGNOSIS — H53022 Refractive amblyopia, left eye: Secondary | ICD-10-CM | POA: Diagnosis not present

## 2021-05-08 DIAGNOSIS — H35373 Puckering of macula, bilateral: Secondary | ICD-10-CM | POA: Diagnosis not present

## 2021-06-20 ENCOUNTER — Ambulatory Visit
Admission: RE | Admit: 2021-06-20 | Discharge: 2021-06-20 | Disposition: A | Discharge status: Home / Self Care | Payer: PPO | Source: Ambulatory Visit | Attending: Adult Health | Admitting: Adult Health

## 2021-06-20 ENCOUNTER — Other Ambulatory Visit: Payer: Self-pay

## 2021-06-20 DIAGNOSIS — R928 Other abnormal and inconclusive findings on diagnostic imaging of breast: Secondary | ICD-10-CM | POA: Diagnosis not present

## 2021-06-20 DIAGNOSIS — Z853 Personal history of malignant neoplasm of breast: Secondary | ICD-10-CM | POA: Diagnosis not present

## 2021-06-20 DIAGNOSIS — N6459 Other signs and symptoms in breast: Secondary | ICD-10-CM | POA: Diagnosis not present

## 2021-06-20 DIAGNOSIS — C50211 Malignant neoplasm of upper-inner quadrant of right female breast: Secondary | ICD-10-CM

## 2021-07-02 DIAGNOSIS — Z171 Estrogen receptor negative status [ER-]: Secondary | ICD-10-CM | POA: Diagnosis not present

## 2021-07-02 DIAGNOSIS — C50211 Malignant neoplasm of upper-inner quadrant of right female breast: Secondary | ICD-10-CM | POA: Diagnosis not present

## 2021-07-03 DIAGNOSIS — M79672 Pain in left foot: Secondary | ICD-10-CM | POA: Diagnosis not present

## 2021-07-03 DIAGNOSIS — S93522A Sprain of metatarsophalangeal joint of left great toe, initial encounter: Secondary | ICD-10-CM | POA: Diagnosis not present

## 2021-07-10 ENCOUNTER — Inpatient Hospital Stay: Payer: PPO | Attending: Adult Health | Admitting: Oncology

## 2021-07-10 ENCOUNTER — Other Ambulatory Visit: Payer: Self-pay

## 2021-07-10 ENCOUNTER — Inpatient Hospital Stay: Payer: PPO

## 2021-07-10 VITALS — BP 123/56 | HR 98 | Temp 99.1°F | Resp 18 | Ht 66.0 in | Wt 240.9 lb

## 2021-07-10 DIAGNOSIS — C773 Secondary and unspecified malignant neoplasm of axilla and upper limb lymph nodes: Secondary | ICD-10-CM | POA: Insufficient documentation

## 2021-07-10 DIAGNOSIS — C50511 Malignant neoplasm of lower-outer quadrant of right female breast: Secondary | ICD-10-CM | POA: Diagnosis not present

## 2021-07-10 DIAGNOSIS — C50211 Malignant neoplasm of upper-inner quadrant of right female breast: Secondary | ICD-10-CM | POA: Diagnosis not present

## 2021-07-10 DIAGNOSIS — M1990 Unspecified osteoarthritis, unspecified site: Secondary | ICD-10-CM | POA: Diagnosis not present

## 2021-07-10 DIAGNOSIS — Z9221 Personal history of antineoplastic chemotherapy: Secondary | ICD-10-CM | POA: Diagnosis not present

## 2021-07-10 DIAGNOSIS — Z170 Estrogen receptor positive status [ER+]: Secondary | ICD-10-CM | POA: Diagnosis not present

## 2021-07-10 DIAGNOSIS — Z923 Personal history of irradiation: Secondary | ICD-10-CM | POA: Insufficient documentation

## 2021-07-10 DIAGNOSIS — Z79811 Long term (current) use of aromatase inhibitors: Secondary | ICD-10-CM | POA: Diagnosis not present

## 2021-07-10 DIAGNOSIS — Z171 Estrogen receptor negative status [ER-]: Secondary | ICD-10-CM | POA: Diagnosis not present

## 2021-07-10 LAB — CBC WITH DIFFERENTIAL/PLATELET
Abs Immature Granulocytes: 0.02 10*3/uL (ref 0.00–0.07)
Basophils Absolute: 0 10*3/uL (ref 0.0–0.1)
Basophils Relative: 1 %
Eosinophils Absolute: 0.2 10*3/uL (ref 0.0–0.5)
Eosinophils Relative: 3 %
HCT: 44 % (ref 36.0–46.0)
Hemoglobin: 14.6 g/dL (ref 12.0–15.0)
Immature Granulocytes: 0 %
Lymphocytes Relative: 20 %
Lymphs Abs: 1.7 10*3/uL (ref 0.7–4.0)
MCH: 27 pg (ref 26.0–34.0)
MCHC: 33.2 g/dL (ref 30.0–36.0)
MCV: 81.3 fL (ref 80.0–100.0)
Monocytes Absolute: 0.8 10*3/uL (ref 0.1–1.0)
Monocytes Relative: 10 %
Neutro Abs: 5.6 10*3/uL (ref 1.7–7.7)
Neutrophils Relative %: 66 %
Platelets: 335 10*3/uL (ref 150–400)
RBC: 5.41 MIL/uL — ABNORMAL HIGH (ref 3.87–5.11)
RDW: 13.4 % (ref 11.5–15.5)
WBC: 8.4 10*3/uL (ref 4.0–10.5)
nRBC: 0 % (ref 0.0–0.2)

## 2021-07-10 LAB — COMPREHENSIVE METABOLIC PANEL
ALT: 16 U/L (ref 0–44)
AST: 19 U/L (ref 15–41)
Albumin: 3.8 g/dL (ref 3.5–5.0)
Alkaline Phosphatase: 66 U/L (ref 38–126)
Anion gap: 9 (ref 5–15)
BUN: 24 mg/dL — ABNORMAL HIGH (ref 8–23)
CO2: 29 mmol/L (ref 22–32)
Calcium: 10 mg/dL (ref 8.9–10.3)
Chloride: 104 mmol/L (ref 98–111)
Creatinine, Ser: 0.87 mg/dL (ref 0.44–1.00)
GFR, Estimated: >60 mL/min (ref >60)
Glucose, Bld: 85 mg/dL (ref 70–99)
Potassium: 4.8 mmol/L (ref 3.5–5.1)
Sodium: 142 mmol/L (ref 135–145)
Total Bilirubin: 0.9 mg/dL (ref 0.3–1.2)
Total Protein: 7.1 g/dL (ref 6.5–8.1)

## 2021-07-10 LAB — VITAMIN D 25 HYDROXY (VIT D DEFICIENCY, FRACTURES): Vit D, 25-Hydroxy: 33.33 ng/mL (ref 30–100)

## 2021-07-10 NOTE — Progress Notes (Signed)
[facility]  [phone] [phone]   ID: Terri Mayer DOB: 04/25/1951  MR#: [id number]  CSN#:[id number]  Patient Care Team: Crawford, Elizabeth A, MD as PCP - General (Internal Medicine) Magrinat, Gustav C, MD as Consulting Physician (Oncology) Wakefield, Matthew, MD as Consulting Physician (General Surgery) Lavoie, Marie-Lyne, MD as Consulting Physician (Obstetrics and Gynecology) Bensimhon, Mayer R, MD as Consulting Physician (Cardiology) Edwards, James, MD (Inactive) as Consulting Physician (Gastroenterology) OTHER MD:  CHIEF COMPLAINT: right breast cancer  CURRENT TREATMENT: Observation   INTERVAL HISTORY: Terri Mayer returns today for follow-up of her right breast cancer. She continues under observation.    She was previously seen by Lindsey, PA on 05/04/2021 for a new firm area in her breast at the lumpectomy site. She underwent bilateral diagnostic mammography with tomography and right breast ultrason[facility]21 showing: breast density category B; no evidence of malignancy in either breast; by ultrasound, skin thickening of the right breast is confirmed but is stable mammographically for over 4 years, no underlying solid or cystic mass is identified.   She was referred back to Dr. Wakefield on 07/02/2021 regarding these concerns. Per his note, he feels biopsy is not needed at this time.  Terri Mayer was quite reassured by that visit   REVIEW OF SYSTEMS: [facility] to be wearing a left boot although she does not like to use it.  She does go And walk her dog for exercise.  Aside from that a detailed review of systems today was stable   COVID 19 VACCINATION STATUS: Pfizer x2; she also had COVID in April 2022, well-tolerated   BREAST CANCER HISTORY: From the original intake note:  "Terri Mayer" underwent bilateral screening mammography with tomosynthesis at the [facility] center 11/02/2015 showing a  possible mass in the right breast and low right axillary area. She was called back for right diagnostic mammography with tomosynthesis and right ultrasonography 11/14/2015. The breast density was category B. There was a lobulated mass in the lateral right breast, with a smaller oval mass immediately adjacent to it. Also in the upper outer quadrant there was a third mass. This has been stable back to 2014 and is felt to be a fibroadenoma.  Ultrasound of the right breast confirmed a hypoechoic mass at the 3:00 position 6 cm from the nipple, measuring 1.4 cm. Adjacent to this there was a 0.5 cm internal mammary lymph node, with normal morphology and normal hilar fat. The mass at the 10:00 position measured 0.8 cm and again was consistent with a fibroadenoma. In the right axilla there was an abnormal lymph node with no internal fat measuring 1.8 cm. The rest of the exam was unremarkable.  Biopsy of the right breast 8:30 o'clock lesion and the right axillary lymph node on 11/16/2015 showed ([id number]) both to be positive for invasive ductal carcinoma, grade 3, the breast mass being estrogen receptor 10% positive with moderate staining intensity, but progesterone receptor negative, but the lymph node being estrogen and progesterone both negative. Both the breast and lymph nodes had MIB-1 of 30%. Both were positive for HER-2 amplification, the signals ratio is being 6.06 and 7.61, and the number per cell 10.00 and 11.80.  The patient's subsequent history is as detailed below    PAST MEDICAL HISTORY: Past Medical History:  Diagnosis Date   Allergy    Breast cancer (HCC)    History of chicken pox    Osteoarthritis      Neck,Back,Knees,Hips,Ankles   Personal history of chemotherapy    Personal history of radiation therapy     PAST SURGICAL HISTORY: Past Surgical History:  Procedure Laterality Date   BREAST LUMPECTOMY Right 2017   DILATION AND CURETTAGE OF UTERUS     EYE SURGERY  at age 21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   PORTACATH PLACEMENT Right 12/11/2015   Procedure: INSERTION PORT-A-CATH WITH ULTRASOUND;  Surgeon: Rolm Bookbinder, MD;  Location: [facility];  Service: General;  Laterality: Right;   RADIOACTIVE SEED GUIDED PARTIAL MASTECTOMY/AXILLARY SENTINEL NODE BIOPSY/AXILLARY NODE DISSECTION Right 05/02/2016   Procedure: RIGHT BREAST SEED GUIDED LUMPECTOMY AND RIGHT SEED TARGETED AXILLARY DISSECTION,AND SENTINEL NODE BIOPSY;  Surgeon: Rolm Bookbinder, MD;  Location: [facility];  Service: General;  Laterality: Right;  RIGHT BREAST SEED GUIDED LUMPECTOMY AND RIGHT SEED TARGETED AXILLARY DISSECTION,AND SENTINEL NODE BIOPSY   WISDOM TOOTH EXTRACTION      FAMILY HISTORY Family History  Problem Relation Age of Onset   Heart disease Father    Diabetes Other        GM   Dementia Other        M   Transient ischemic attack Other        F   Charcot-Marie-Tooth disease Brother    Colon cancer Neg Hx    Breast cancer Neg Hx   The patient's father died after multiple strokes at the age of 42. The patient's mother died at the age of 62 with Alzheimer's disease. The patient had one brother, one sister. The patient's paternal grandfather died from lung cancer in his 15s. On the mother's side there are 2 (second) cousins with breast cancer diagnosed in their early 10s, and a more remote cousin diagnosed with breast cancer at age 45. There is no history of ovarian cancer.    GYNECOLOGIC HISTORY:  No LMP recorded. Patient is postmenopausal.  menarche age 49, the patient is GX P0 and she understands that not carrying a child to term before the age of 19 essentially doubled the risk of breast cancer. She stopped having periods between age 46 and 78 and did not take hormone replacement. 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 She lives by herself, with her toy poodle [place].. Her son, adopted at birth, Terri Mayer, graduated from [employer] with a bachelors of Psychology and Sociology in May 2018. 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    ADVANCED DIRECTIVES: Terri Mayer tells me as of March 2018 her sister Terri Mayer in [place] is still her healthcare power of attorney. She can be reached at [phone]. However she is intending to name her son Terri Mayer as her healthcare part of attorney. He can be reached at [phone]. In the second slot she will name her sister-in-law Terri Mayer who is vice president of a hospital, to assist her son. Terri Mayer can be reached at [phone]. The patient's sister will no longer be her healthcare part of attorney.   HEALTH MAINTENANCE: Social History   Tobacco Use   Smoking status: Never   Smokeless tobacco: Never  Vaping Use   Vaping Use: Never used  Substance Use Topics   Alcohol use: Yes    Comment: rarely    Drug use: Not Currently     Colonoscopy: 12/28/2004  PAP:  Bone density:  Lipid panel:  Allergies  Allergen Reactions   Codeine Nausea And Vomiting    Current Outpatient Medications  Medication Sig Dispense Refill   cholecalciferol (VITAMIN D3) 25 MCG (1000 UT) tablet Take 1,000 Units by mouth daily.     cyclobenzaprine (FLEXERIL) 10 MG tablet Take 10 mg by mouth 3 (three) times daily as needed for muscle spasms.     ibuprofen (ADVIL,MOTRIN) 200 MG tablet Take 200 mg by mouth every 6 (six) hours as needed for fever or mild pain. Reported on 03/22/2016     Multiple Vitamin (MULTIVITAMIN) tablet Take 1 tablet by mouth daily.     No current facility-administered medications for this visit.    OBJECTIVE: White woman who appears younger than stated age  Vitals:   07/10/21 1317  BP: (!) 123/56  Pulse: 98  Resp: 18  Temp: 99.1 F (37.3 C)  SpO2: 100%     Body mass index is 38.88 kg/m.     ECOG FS:0 -  Asymptomatic  Sclerae unicteric, EOMs intact Wearing a mask No cervical or supraclavicular adenopathy Lungs no rales or rhonchi Heart regular rate and rhythm Abd soft, obese nontender, positive bowel sounds MSK no focal spinal tenderness, no upper extremity lymphedema Neuro: nonfocal, well oriented, appropriate affect Breasts: The right breast has undergone lumpectomy followed by radiation.  There is no evidence of local recurrence per the left breast is benign.  Both axillae are benign     LAB RESULTS:  CMP     Component Value Date/Time   NA 142 07/10/2021 1255   NA 142 02/13/2017 1136   K 4.8 07/10/2021 1255   K 4.8 02/13/2017 1136   CL 104 07/10/2021 1255   CO2 29 07/10/2021 1255   CO2 34 (H) 02/13/2017 1136   GLUCOSE 85 07/10/2021 1255   GLUCOSE 92 02/13/2017 1136   BUN 24 (H) 07/10/2021 1255   BUN 26.9 (H) 02/13/2017 1136   CREATININE 0.87 07/10/2021 1255   CREATININE 1.0 02/13/2017 1136   CALCIUM 10.0 07/10/2021 1255   CALCIUM 10.5 (H) 02/13/2017 1136   PROT 7.1 07/10/2021 1255   PROT 6.9 02/13/2017 1136   ALBUMIN 3.8 07/10/2021 1255   ALBUMIN 3.9 02/13/2017 1136   AST 19 07/10/2021 1255   AST 21 02/13/2017 1136   ALT 16 07/10/2021 1255   ALT 15 02/13/2017 1136   ALKPHOS 66 07/10/2021 1255   ALKPHOS 69 02/13/2017 1136   BILITOT 0.9 07/10/2021 1255   BILITOT 1.28 (H) 02/13/2017 1136   GFRNONAA >60 07/10/2021 1255   GFRAA >60 07/08/2019 1313    INo results found for: SPEP, UPEP  Lab Results  Component Value Date   WBC 8.4 07/10/2021   NEUTROABS 5.6 07/10/2021   HGB 14.6 07/10/2021   HCT 44.0 07/10/2021   MCV 81.3 07/10/2021   PLT 335 07/10/2021      Chemistry      Component Value Date/Time   NA 142 07/10/2021 1255   NA 142 02/13/2017 1136   K 4.8 07/10/2021 1255   K 4.8 02/13/2017 1136   CL 104 07/10/2021 1255   CO2 29 07/10/2021 1255   CO2 34 (H) 02/13/2017 1136   BUN 24 (H) 07/10/2021 1255   BUN 26.9 (H) 02/13/2017 1136   CREATININE  0.87 07/10/2021 1255   CREATININE 1.0 02/13/2017 1136      Component Value Date/Time   CALCIUM 10.0 07/10/2021 1255   CALCIUM 10.5 (H) 02/13/2017 1136   ALKPHOS 66 07/10/2021 1255     ALKPHOS 69 02/13/2017 1136   AST 19 07/10/2021 1255   AST 21 02/13/2017 1136   ALT 16 07/10/2021 1255   ALT 15 02/13/2017 1136   BILITOT 0.9 07/10/2021 1255   BILITOT 1.28 (H) 02/13/2017 1136       No results found for: LABCA2  No components found for: LABCA125  No results for input(s): INR in the last 168 hours.   STUDIES:  US BREAST LTD UNI RIGHT INC AXILLA  Addendum Date: 06/22/2021   ADDENDUM REPORT: 06/21/2021 16:20 ADDENDUM: Surgical consultation has been arranged with Dr. Matthew Wakefield at [facility] on August 15,2022. Susan Eaton RN on 06/21/2021 Electronically Signed   By: Stan  Maynard M.D.   On: 06/21/2021 16:20   Addendum Date: 06/20/2021   ADDENDUM REPORT: 06/20/2021 15:31 ADDENDUM: [facility] will contact [facility] and provide them with patient's contact information. Electronically Signed   By: Stan  Maynard M.D.   On: 06/20/2021 15:31   Result Date: 06/22/2021 CLINICAL DATA:  History of RIGHT breast cancer in 2017 status post breast conservation surgery and radiation therapy. Patient describes new firmness within the periareolar region of the RIGHT breast (infra-areolar) and additional palpable lumps within the RIGHT axilla. EXAM: DIGITAL DIAGNOSTIC BILATERAL MAMMOGRAM WITH TOMOSYNTHESIS AND CAD; ULTRASOUND RIGHT BREAST LIMITED TECHNIQUE: Bilateral digital diagnostic mammography and breast tomosynthesis was performed. The images were evaluated with computer-aided detection.; Targeted ultrasound examination of the right breast was performed COMPARISON:  Previous exam(s). ACR Breast Density Category b: There are scattered areas of fibroglandular density. FINDINGS: Bilateral diagnostic mammogram: There are stable postsurgical and post  radiation changes within the RIGHT breast. There are no new dominant masses, suspicious calcifications or secondary signs of malignancy identified in either breast. On physical exam, there is skin thickening and mild peau d'orange appearance at the RIGHT nipple-areolar complex, corresponding to 1 of the patient's areas of clinical concern. Targeted ultrasound is performed, evaluating the subareolar and periareolar RIGHT breast, showing skin thickening but no underlying solid or cystic mass. No sebaceous cyst. Targeted ultrasound is also performed of the RIGHT axilla, corresponding to additional palpable areas of concern described by the patient, showing only normal soft tissues throughout. No solid or cystic mass. No enlarged or morphologically abnormal lymph nodes. IMPRESSION: 1. No mammographic or sonographic evidence of malignancy within the RIGHT breast. 2. No mammographic evidence of malignancy within the LEFT breast. 3. Patient is concerned about new palpable firmness within the infra-areolar RIGHT breast and mild peau d'orange appearance at the RIGHT nipple-areolar complex (patient states that this skin appearance is new in the past year). By ultrasound, skin thickening is confirmed but is stable mammographically for over 4 years suggesting benign post radiation change. No underlying solid or cystic mass is identified by ultrasound within the subareolar or periareolar RIGHT breast. RECOMMENDATION: 1. Surgical consultation and possible skin punch biopsy at the RIGHT nipple-areolar complex. 2. If RIGHT breast findings are deemed benign by clinical evaluation by the surgeon and/or skin punch biopsy, would then merely recommend screening mammogram in one year.(Code:SM-B-01Y) I have discussed the findings and recommendations with the patient. If applicable, a reminder letter will be sent to the patient regarding the next appointment. BI-RADS CATEGORY  2: Benign. Electronically Signed: By: Stan  Maynard M.D. On:  06/20/2021 12:02  MM DIAG BREAST TOMO BILATERAL  Addendum Date: 06/22/2021   ADDENDUM REPORT: 06/21/2021 16:20 ADDENDUM: Surgical consultation has been [facility]   [facility] 15,2022. Susan Eaton RN on 06/21/2021 Electronically Signed   By: Stan  Maynard M.D.   On: 06/21/2021 16:20   Addendum Date: 06/20/2021   ADDENDUM REPORT: 06/20/2021 15:31 ADDENDUM: [facility] will contact [facility] and provide them with patient's contact information. Electronically Signed   By: Stan  Maynard M.D.   On: 06/20/2021 15:31   Result Date: 06/22/2021 CLINICAL DATA:  History of RIGHT breast cancer in 2017 status post breast conservation surgery and radiation therapy. Patient describes new firmness within the periareolar region of the RIGHT breast (infra-areolar) and additional palpable lumps within the RIGHT axilla. EXAM: DIGITAL DIAGNOSTIC BILATERAL MAMMOGRAM WITH TOMOSYNTHESIS AND CAD; ULTRASOUND RIGHT BREAST LIMITED TECHNIQUE: Bilateral digital diagnostic mammography and breast tomosynthesis was performed. The images were evaluated with computer-aided detection.; Targeted ultrasound examination of the right breast was performed COMPARISON:  Previous exam(s). ACR Breast Density Category b: There are scattered areas of fibroglandular density. FINDINGS: Bilateral diagnostic mammogram: There are stable postsurgical and post radiation changes within the RIGHT breast. There are no new dominant masses, suspicious calcifications or secondary signs of malignancy identified in either breast. On physical exam, there is skin thickening and mild peau d'orange appearance at the RIGHT nipple-areolar complex, corresponding to 1 of the patient's areas of clinical concern. Targeted ultrasound is performed, evaluating the subareolar and periareolar RIGHT breast, showing skin thickening but no underlying solid or cystic mass. No sebaceous cyst. Targeted  ultrasound is also performed of the RIGHT axilla, corresponding to additional palpable areas of concern described by the patient, showing only normal soft tissues throughout. No solid or cystic mass. No enlarged or morphologically abnormal lymph nodes. IMPRESSION: 1. No mammographic or sonographic evidence of malignancy within the RIGHT breast. 2. No mammographic evidence of malignancy within the LEFT breast. 3. Patient is concerned about new palpable firmness within the infra-areolar RIGHT breast and mild peau d'orange appearance at the RIGHT nipple-areolar complex (patient states that this skin appearance is new in the past year). By ultrasound, skin thickening is confirmed but is stable mammographically for over 4 years suggesting benign post radiation change. No underlying solid or cystic mass is identified by ultrasound within the subareolar or periareolar RIGHT breast. RECOMMENDATION: 1. Surgical consultation and possible skin punch biopsy at the RIGHT nipple-areolar complex. 2. If RIGHT breast findings are deemed benign by clinical evaluation by the surgeon and/or skin punch biopsy, would then merely recommend screening mammogram in one year.(Code:SM-B-01Y) I have discussed the findings and recommendations with the patient. If applicable, a reminder letter will be sent to the patient regarding the next appointment. BI-RADS CATEGORY  2: Benign. Electronically Signed: By: Stan  Maynard M.D. On: 06/20/2021 12:02    ASSESSMENT: 70 y.o. [place] woman status post right breast lower outer quadrant and right axillary lymph node biopsy 11/16/2015, also positive for a clinical  T1c pN1, stage IIA invasive ductal carcinoma grade 3, essentially estrogen and progesterone receptor negative but HER-2 amplified, with an MIB-1 of 30%  (a) the breast lesion had moderate positivity for the estrogen receptor at 10%; the axillary lymph node lesion was estrogen receptor negative. Both were progesterone receptor  negative  (1) neoadjuvant chemotherapy with carboplatin, docetaxel, trastuzumab and pertuzumab for 6 cycles, started 12/18/2015  (a) docetaxel switched to gemcitabine for cycles 4-6 because of progressive neuropathy  (b) pertuzumab dose cut in half with cycle 4 due to excessive diarrhea, resumed full dose cycle 5  (2) trastuzumab continued to total one year, last dose   12/11/2016  (a) baseline echocardiogram on 12/12/15 showed an EF of 60-65%  (b) echocardiogram 04/19/2016 showed an ejection fraction in the 50-55% range  (c) final echocardiogram 10/22/2016 shows an ejection fraction in the 55-60% range. He  (3) right lumpectomy and sentinel lymph node sampling 05/02/2016 showed a complete pathologic response (ypT0, ypN0)  (4) adjuvant radiation 06/17/3016-07/12/2016  1. Right breast boost// 7.5 Gy with 3 fractions at a dose of 2.5 Gy/ fraction 2. Right breast// 42.5 Gy with 17 fractions at a dose of 2.5 Gy/ fraction  (5) anastrozole prescribed May 2018 but never started by the patient  (a) initial breast biopsy showed 10% estrogen positivity with moderate staining intensity  (6) radiation recall rash 03/06/2017 --resolved  PLAN:  Terri Mayer is now a little over 5 years out from definitive surgery for her breast cancer.  There is no evidence of disease recurrence.  This is very favorable.  At this point I feel comfortable releasing her to her primary care physician's.  All she will need in terms of breast cancer follow-up is her yearly mammography and a yearly physician breast exam  We will be glad to see Kate again at 1 point in the future if and when the need arises but as of now are making no further routine appointment for her here.  Total encounter time 20 minutes.*   Gustav C. Magrinat, MD 07/10/21 5:49 PM Medical Oncology and Hematology [facility] [address] [place], [place] [address] Tel. [phone]    Fax. [phone]   I, Katie Daubenspeck, am acting as  scribe for Dr. Gustav C. Magrinat.  I, Gustav Magrinat MD, have reviewed the above documentation for accuracy and completeness, and I agree with the above.    *Total Encounter Time as defined by the Centers for Medicare and Medicaid Services includes, in addition to the face-to-face time of a patient visit (documented in the note above) non-face-to-face time: obtaining and reviewing outside history, ordering and reviewing medications, tests or procedures, care coordination (communications with other health care professionals or caregivers) and documentation in the medical record.  

## 2021-08-30 ENCOUNTER — Encounter (HOSPITAL_COMMUNITY): Payer: Self-pay | Admitting: Pharmacy Technician

## 2021-08-30 ENCOUNTER — Emergency Department (HOSPITAL_COMMUNITY)
Admission: EM | Admit: 2021-08-30 | Discharge: 2021-08-30 | Disposition: A | Discharge status: Home / Self Care | Payer: PPO | Attending: Emergency Medicine | Admitting: Emergency Medicine

## 2021-08-30 DIAGNOSIS — Z853 Personal history of malignant neoplasm of breast: Secondary | ICD-10-CM | POA: Insufficient documentation

## 2021-08-30 DIAGNOSIS — S41102A Unspecified open wound of left upper arm, initial encounter: Secondary | ICD-10-CM | POA: Diagnosis not present

## 2021-08-30 DIAGNOSIS — S4992XA Unspecified injury of left shoulder and upper arm, initial encounter: Secondary | ICD-10-CM | POA: Diagnosis present

## 2021-08-30 DIAGNOSIS — W57XXXA Bitten or stung by nonvenomous insect and other nonvenomous arthropods, initial encounter: Secondary | ICD-10-CM | POA: Insufficient documentation

## 2021-08-30 LAB — CBC
HCT: 44 % (ref 36.0–46.0)
Hemoglobin: 13.8 g/dL (ref 12.0–15.0)
MCH: 26.4 pg (ref 26.0–34.0)
MCHC: 31.4 g/dL (ref 30.0–36.0)
MCV: 84.1 fL (ref 80.0–100.0)
Platelets: 324 10*3/uL (ref 150–400)
RBC: 5.23 MIL/uL — ABNORMAL HIGH (ref 3.87–5.11)
RDW: 13.3 % (ref 11.5–15.5)
WBC: 6.9 10*3/uL (ref 4.0–10.5)
nRBC: 0 % (ref 0.0–0.2)

## 2021-08-30 LAB — BASIC METABOLIC PANEL
Anion gap: 9 (ref 5–15)
BUN: 19 mg/dL (ref 8–23)
CO2: 25 mmol/L (ref 22–32)
Calcium: 9.2 mg/dL (ref 8.9–10.3)
Chloride: 105 mmol/L (ref 98–111)
Creatinine, Ser: 0.84 mg/dL (ref 0.44–1.00)
GFR, Estimated: >60 mL/min (ref >60)
Glucose, Bld: 90 mg/dL (ref 70–99)
Potassium: 4.3 mmol/L (ref 3.5–5.1)
Sodium: 139 mmol/L (ref 135–145)

## 2021-08-30 MED ORDER — DOXYCYCLINE HYCLATE 100 MG PO CAPS: 100.0000 mg | ORAL_CAPSULE | Freq: Two times a day (BID) | ORAL | 0 refills | Status: DC

## 2021-08-30 MED ORDER — TETANUS-DIPHTH-ACELL PERTUSSIS 5-2.5-18.5 LF-MCG/0.5 IM SUSY
0.5000 mL | PREFILLED_SYRINGE | Freq: Once | INTRAMUSCULAR | Status: DC
  Filled 2021-08-30: qty 0.5

## 2021-08-30 NOTE — ED Triage Notes (Signed)
Pt here with reports of being bit on L forearm Tuesday by ?snake. Pt with redness and pain to site.

## 2021-08-30 NOTE — Discharge Instructions (Signed)
Please read and follow all provided instructions.  Your diagnoses today include:  1. Wound of left upper extremity, initial encounter     Tests performed today include: Vital signs. See below for your results today.   Medications prescribed:  Doxycycline - antibiotic  Fill this antibiotic if you develop worsening pain, swelling, redness, streaking of your arm away from the initial bite area.  If you fill this medication, please schedule a follow-up for recheck and take the entire course as directed.  Take any prescribed medications only as directed.   Home care instructions:  Follow any educational materials contained in this packet. Keep affected area above the level of your heart when possible. Wash area gently twice a day with warm soapy water. Do not apply alcohol or hydrogen peroxide. Cover the area if it draining or weeping.   Follow-up instructions: If your symptoms are not improving or worsening, return to the emergency department for severe symptoms, or see your primary care doctor within 24 hours for recheck.  Return instructions:  Return to the Emergency Department if you have: Fever Worsening symptoms Worsening pain Worsening swelling Redness of the skin that moves away from the affected area, especially if it streaks away from the affected area  Any other emergent concerns  Your vital signs today were: BP 126/75 (BP Location: Left Arm)   Pulse 85   Temp 98.2 F (36.8 C) (Oral)   Resp 17   SpO2 100%  If your blood pressure (BP) was elevated above 135/85 this visit, please have this repeated by your doctor within one month. --------------

## 2021-08-30 NOTE — ED Provider Notes (Signed)
Emergency Medicine Provider Triage Evaluation Note  Terri Mayer , a 70 y.o. female  was evaluated in triage.  Pt complains of a wound over the left proximal forearm that happened 2 days ago.  She states that she was gardening and some thick weeds when something bit her.  Does not recall exactly what bit her but saw 2 puncture marks.  Since then she has had increasing level of pain, swelling, redness, and warmth to the area.  She describes the pain as a throbbing sensation.  She denies any fevers but reports associated chills.  No shortness of breath or chest pain.  Review of Systems  Positive:  Negative: See above   Physical Exam  BP (!) 152/84 (BP Location: Right Arm)   Pulse 89   Temp 98.2 F (36.8 C) (Oral)   Resp 18   SpO2 99%  Gen:   Awake, no distress   Resp:  Normal effort  MSK:   Moves extremities without difficulty  Other:  Area of erythema and warmth over the proximal forearm with an open dry wound.  Medical Decision Making  Medically screening exam initiated at 1:15 PM.  Appropriate orders placed.  Oda Placke was informed that the remainder of the evaluation will be completed by another provider, this initial triage assessment does not replace that evaluation, and the importance of remaining in the ED until their evaluation is complete.     Myna Bright Brinsmade, PA-C 08/30/21 1317    Tegeler, Gwenyth Allegra, MD 08/30/21 [phone]

## 2021-08-30 NOTE — ED Provider Notes (Signed)
[facility] EMERGENCY DEPARTMENT Provider Note   CSN: [id number] Arrival date & time: 08/30/21  1216     History No chief complaint on file.   Terri Mayer is a 70 y.o. female.  Patient presents to the emergency department for evaluation of a bite wound on her left proximal forearm occurring 2 days ago.  Patient was weeding in her garden and was bit by something.  She thinks that it was a snake due to seeing 2 small puncture wounds.  She did have severe pain when she was bitten.  She decreased activity with her arm over the rest of the day 2 days ago.  She states that yesterday the swelling in her arm was worse.  She had difficulty sleeping due to discomfort.  She has had lightheadedness, headache and some left-sided chest discomfort today, not currently present at time of exam.  No associated shortness of breath, cough, vomiting.  Patient states that she took ibuprofen for headache this afternoon. The onset of this condition was acute. The course is improving since this AM. Aggravating factors: movement. Alleviating factors: none. Last tetanus approx 5 years ago.         Past Medical History:  Diagnosis Date   Allergy    Breast cancer ([place])    History of chicken pox    Osteoarthritis    Neck,Back,Knees,Hips,Ankles   Personal history of chemotherapy    Personal history of radiation therapy     Patient Active Problem List   Diagnosis Date Noted   Contact dermatitis due to plant 10/01/2019   Routine general medical examination at a health care facility 01/06/2017   Malignant neoplasm of upper-inner quadrant of right breast in female, estrogen receptor negative ([place]) 11/30/2015   Intermittent palpitations 11/30/2015   Morbid obesity ([place]) 11/30/2015    Past Surgical History:  Procedure Laterality Date   BREAST LUMPECTOMY Right 2017   DILATION AND CURETTAGE OF UTERUS     EYE SURGERY  at age 73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   PORTACATH PLACEMENT Right  12/11/2015   Procedure: INSERTION PORT-A-CATH WITH ULTRASOUND;  Surgeon: Rolm Bookbinder, MD;  Location: [facility];  Service: General;  Laterality: Right;   RADIOACTIVE SEED GUIDED PARTIAL MASTECTOMY/AXILLARY SENTINEL NODE BIOPSY/AXILLARY NODE DISSECTION Right 05/02/2016   Procedure: RIGHT BREAST SEED GUIDED LUMPECTOMY AND RIGHT SEED TARGETED AXILLARY DISSECTION,AND SENTINEL NODE BIOPSY;  Surgeon: Rolm Bookbinder, MD;  Location: [facility];  Service: General;  Laterality: Right;  RIGHT BREAST SEED GUIDED LUMPECTOMY AND RIGHT SEED TARGETED AXILLARY DISSECTION,AND SENTINEL NODE BIOPSY   WISDOM TOOTH EXTRACTION       OB History     Gravida  0   Para  0   Term  0   Preterm  0   AB  0   Living  0      SAB  0   IAB  0   Ectopic  0   Multiple  0   Live Births  0           Family History  Problem Relation Age of Onset   Heart disease Father    Diabetes Other        GM   Dementia Other        M   Transient ischemic attack Other        F   Charcot-Marie-Tooth disease Brother    Colon cancer Neg Hx    Breast cancer Neg Hx  Social History   Tobacco Use   Smoking status: Never   Smokeless tobacco: Never  Vaping Use   Vaping Use: Never used  Substance Use Topics   Alcohol use: Yes    Comment: rarely    Drug use: Not Currently    Home Medications Prior to Admission medications   Medication Sig Start Date End Date Taking? Authorizing Provider  cholecalciferol (VITAMIN D3) 25 MCG (1000 UT) tablet Take 1,000 Units by mouth daily.    [provider]  cyclobenzaprine (FLEXERIL) 10 MG tablet Take 10 mg by mouth 3 (three) times daily as needed for muscle spasms.    [provider]  ibuprofen (ADVIL,MOTRIN) 200 MG tablet Take 200 mg by mouth every 6 (six) hours as needed for fever or mild pain. Reported on 03/22/2016    [provider]  Multiple Vitamin (MULTIVITAMIN) tablet Take 1 tablet by mouth daily.     [provider]    Allergies    Codeine  Review of Systems   Review of Systems  Constitutional:  Negative for fever.  HENT:  Negative for rhinorrhea and sore throat.   Eyes:  Negative for redness.  Respiratory:  Positive for chest tightness. Negative for cough.   Cardiovascular:  Negative for chest pain.  Gastrointestinal:  Positive for nausea. Negative for abdominal pain, diarrhea and vomiting.  Genitourinary:  Negative for dysuria, frequency, hematuria and urgency.  Musculoskeletal:  Negative for myalgias.  Skin:  Positive for color change and wound. Negative for rash.  Neurological:  Positive for light-headedness. Negative for headaches.      Physical Exam Updated Vital Signs BP 126/75 (BP Location: Left Arm)   Pulse 85   Temp 98.2 F (36.8 C) (Oral)   Resp 17   SpO2 100%   Physical Exam Vitals and nursing note reviewed.  Constitutional:      General: She is not in acute distress.    Appearance: She is well-developed.  HENT:     Head: Normocephalic and atraumatic.     Right Ear: External ear normal.     Left Ear: External ear normal.     Nose: Nose normal.  Eyes:     Conjunctiva/sclera: Conjunctivae normal.  Cardiovascular:     Rate and Rhythm: Normal rate and regular rhythm.     Heart sounds: No murmur heard. Pulmonary:     Effort: No respiratory distress.     Breath sounds: No wheezing, rhonchi or rales.  Abdominal:     Palpations: Abdomen is soft.     Tenderness: There is no abdominal tenderness. There is no guarding or rebound.  Musculoskeletal:     Cervical back: Normal range of motion and neck supple.     Right lower leg: No edema.     Left lower leg: No edema.  Skin:    General: Skin is warm and dry.     Findings: No rash.     Comments: Mild area of diffuse erythema overlying the lateral epicondyle area and slightly distal.  A few punctate areas of petechiae and a small superficial skin ulceration noted.  No lymphangitis.  Area is mildly  tender.  No fluctuance or induration.  Neurological:     General: No focal deficit present.     Mental Status: She is alert. Mental status is at baseline.     Motor: No weakness.  Psychiatric:        Mood and Affect: Mood normal.    ED Results / Procedures / Treatments  Labs (all labs ordered are listed, but only abnormal results are displayed) Labs Reviewed  CBC - Abnormal; Notable for the following components:      Result Value   RBC 5.23 (*)    All other components within normal limits  BASIC METABOLIC PANEL    EKG None  Radiology No results found.  Procedures Procedures   Medications Ordered in ED Medications - No data to display  ED Course  I have reviewed the triage vital signs and the nursing notes.  Pertinent labs & imaging results that were available during my care of the patient were reviewed by me and considered in my medical decision making (see chart for details).  Patient seen and examined.  Area where the patient was bitten, seems to be improving this morning per patient.  I recommended supportive care at this point with elevation, cool compresses, light exercise.  I will prescribe a prescription for doxycycline.  She will be given this to take only if her symptoms worsen.  We discussed what these could be.  Discussed that if this was a snake bite, the envenomation was minor and she does not need any further work-up for this at this time given that it has been more than 48 hours without severe worsening.  Labs ordered in triage are reassuring.  Regarding her resolved and mild lightheadedness, nausea and chest discomfort --recommended EKG.  Patient agreeable however later decided to forego this.  Patient discussed with and seen by Dr. Ronnald Nian.  He agrees with plan.  Tetanus ordered per him.   Vital signs reviewed and are as follows: BP 130/73   Pulse 81   Temp 98.1 F (36.7 C) (Oral)   Resp 18   SpO2 100%   5:12 PM encourage PCP follow-up if not  improving, return with rapid worsening.  Pt urged to return with worsening pain, worsening swelling, expanding area of redness or streaking up extremity, fever, or any other concerns. Pt verbalizes understanding and agrees with plan.    MDM Rules/Calculators/A&P                           Patient here with bite wound.  She is concerned it was from a snake however no visual identification was made.  Wound occurred greater than 48 hours ago.  Does not appear to be consistent with a significant copperhead envenomation.  Does not appear to be consistent with black widow bite.  Patient had swelling that became worse about 24 hours after the initial bite.  Concern for infection, however symptoms are currently improving.  Will give trial of supportive care with doxycycline to start if worsening.  Labs ordered in triage look good.  Plan for discharge with close return/follow-up instructions.  Patient did have some lightheadedness and chest discomfort today.  This is currently resolved.  Does not seem to be associated with significant shortness of breath.  No associated exertional component and symptoms are very atypical for ACS.  Offered EKG, declined.  Patient will follow up or return with worsening.    Final Clinical Impression(s) / ED Diagnoses Final diagnoses:  Wound of left upper extremity, initial encounter    Rx / DC Orders ED Discharge Orders          Ordered    doxycycline (VIBRAMYCIN) 100 MG capsule  2 times daily,   Status:  Discontinued        08/30/21 1705    doxycycline (VIBRAMYCIN) 100 MG capsule  2 times daily        08/30/21 1708             Carlisle Cater, PA-C 08/30/21 [place], [place], DO 08/30/21 2252

## 2021-08-30 NOTE — ED Notes (Signed)
Pt stated she doesn't want the ekg anymore, MD notified.

## 2021-08-30 NOTE — ED Provider Notes (Signed)
I personally evaluated the patient during the encounter and completed a history, physical, procedures, medical decision making to contribute to the overall care of the patient and decision making for the patient briefly, the patient is a 70 y.o. female is here with puncture wound to her left forearm.  Occurred 2 days ago.  She was doing yard work and got bit by something.  Not sure if it was a snake or something else.  She is neurovascular neuromuscularly intact on exam.  She looks like she has a very mild cellulitis around this bite mark.  She is unsure of her tetanus status.  Tetanus was updated.  Will prescribe antibiotic.  Lab work was unremarkable.  No concern for deep space abscess or sepsis.  Understands wound care instructions and discharge.  This chart was dictated using voice recognition software.  Despite best efforts to proofread,  errors can occur which can change the documentation meaning.    EKG Interpretation None            Lennice Sites, DO 08/30/21 1722

## 2021-09-04 ENCOUNTER — Ambulatory Visit (INDEPENDENT_AMBULATORY_CARE_PROVIDER_SITE_OTHER): Payer: PPO | Admitting: Obstetrics & Gynecology

## 2021-09-04 ENCOUNTER — Other Ambulatory Visit: Payer: Self-pay

## 2021-09-04 ENCOUNTER — Encounter: Payer: Self-pay | Admitting: Obstetrics & Gynecology

## 2021-09-04 VITALS — BP 114/64 | HR 78 | Resp 16 | Ht 65.5 in | Wt 239.0 lb

## 2021-09-04 DIAGNOSIS — Z171 Estrogen receptor negative status [ER-]: Secondary | ICD-10-CM

## 2021-09-04 DIAGNOSIS — M85851 Other specified disorders of bone density and structure, right thigh: Secondary | ICD-10-CM

## 2021-09-04 DIAGNOSIS — Z780 Asymptomatic menopausal state: Secondary | ICD-10-CM

## 2021-09-04 DIAGNOSIS — C50211 Malignant neoplasm of upper-inner quadrant of right female breast: Secondary | ICD-10-CM

## 2021-09-04 DIAGNOSIS — Z9289 Personal history of other medical treatment: Secondary | ICD-10-CM | POA: Diagnosis not present

## 2021-09-04 DIAGNOSIS — N904 Leukoplakia of vulva: Secondary | ICD-10-CM

## 2021-09-04 DIAGNOSIS — Z6839 Body mass index (BMI) 39.0-39.9, adult: Secondary | ICD-10-CM

## 2021-09-04 DIAGNOSIS — Z01419 Encounter for gynecological examination (general) (routine) without abnormal findings: Secondary | ICD-10-CM | POA: Diagnosis not present

## 2021-09-04 MED ORDER — CLOBETASOL PROPIONATE 0.05 % EX OINT: 1.0000 "application " | TOPICAL_OINTMENT | CUTANEOUS | 4 refills | Status: DC

## 2021-09-04 NOTE — Progress Notes (Signed)
Terri Mayer Sep 10, 1951 [id number]   History:    70 y.o. G0 Divorced, single.  Retired Pharmacist, community.   RP:  Established patient presenting for annual gyn exam, Lichen Sclerosus and Osteopenia   HPI: Postmenopausal, well on no hormone replacement therapy.  No postmenopausal bleeding.  No pelvic pain.  Vulvar itching and discomfort associated with Lichen Sclerosus.  Abstinent.  History of Rt Invasive Ductal Breast Ca in 2016.  Partial Rt breast Mastectomy and Radiation Therapy, followed by Dr Jana Hakim.  BMI 39.17.  Needs to increase physical activity.  Health labs with Fam MD.  [name] 2017.  BD 03/2020 Osteopenia Rt Fem Neck -1.5.   Past medical history,surgical history, family history and social history were all reviewed and documented in the EPIC chart.  Gynecologic History No LMP recorded. Patient is postmenopausal.  Obstetric History OB History  Gravida Para Term Preterm AB Living  0 0 0 0 0 0  SAB IAB Ectopic Multiple Live Births  0 0 0 0 0     ROS: A ROS was performed and pertinent positives and negatives are included in the history.  GENERAL: No fevers or chills. HEENT: No change in vision, no earache, sore throat or sinus congestion. NECK: No pain or stiffness. CARDIOVASCULAR: No chest pain or pressure. No palpitations. PULMONARY: No shortness of breath, cough or wheeze. GASTROINTESTINAL: No abdominal pain, nausea, vomiting or diarrhea, melena or bright red blood per rectum. GENITOURINARY: No urinary frequency, urgency, hesitancy or dysuria. MUSCULOSKELETAL: No joint or muscle pain, no back pain, no recent trauma. DERMATOLOGIC: No rash, no itching, no lesions. ENDOCRINE: No polyuria, polydipsia, no heat or cold intolerance. No recent change in weight. HEMATOLOGICAL: No anemia or easy bruising or bleeding. NEUROLOGIC: No headache, seizures, numbness, tingling or weakness. PSYCHIATRIC: No depression, no loss of interest in normal activity or change in sleep pattern.      Exam:   BP 114/64   Pulse 78   Resp 16   Ht 5' 5.5" (1.664 m)   Wt 239 lb (108.4 kg)   BMI 39.17 kg/m   Body mass index is 39.17 kg/m.  General appearance : Well developed well nourished female. No acute distress HEENT: Eyes: no retinal hemorrhage or exudates,  Neck supple, trachea midline, no carotid bruits, no thyroidmegaly Lungs: Clear to auscultation, no rhonchi or wheezes, or rib retractions  Heart: Regular rate and rhythm, no murmurs or gallops Breast:Examined in sitting and supine position were symmetrical in appearance, no palpable masses or tenderness,  no skin retraction, no nipple inversion, no nipple discharge, no skin discoloration, no axillary or supraclavicular lymphadenopathy Abdomen: no palpable masses or tenderness, no rebound or guarding Extremities: no edema or skin discoloration or tenderness  Pelvic: Vulva: Severe white atrophy in a butterfly distribution             Vagina: No gross lesions or discharge  Cervix: No gross lesions or discharge  Uterus  AV, normal size, shape and consistency, non-tender and mobile  Adnexa  Without masses or tenderness  Anus: Normal   Assessment/Plan:  70 y.o. female for annual exam   1. Encounter for routine gynecological examination with Papanicolaou smear of cervix Normal gynecologic exam and menopause.  No indication for Pap test at this time.  Breast exam within normal limits in the context of a history of right breast cancer.  Bilateral diagnostic mammogram and right breast ultrasound benign in August 2022.  Colonoscopy 2018.  Health labs with family physician.  2. Postmenopause Well  on no hormone replacement therapy.  No postmenopausal bleeding.  3. Osteopenia of neck of right femur Bone density May 2021 showing osteopenia at the right femoral neck with a T score of -1.5.  We will repeat a bone density in a year.  Continue with vitamin D supplements, calcium intake of 1.5 g/day total and regular weightbearing  physical activities.  4. Lichen sclerosus et atrophicus of the vulva Active lichen sclerosus.  Will increase treatment with clobetasol ointment every day for 2 weeks and then long-term maintenance 3 times a week.  Recommendation to apply a thin layer of ointment on all the affected vulva and perianal area.  Prescription sent to pharmacy.  5. Malignant neoplasm of upper-inner quadrant of right breast in female, estrogen receptor negative ([place]) Followed by Dr. Jana Hakim.  6. Class 2 severe obesity due to excess calories with serious comorbidity and body mass index (BMI) of 39.0 to 39.9 in adult [facility]) Recommend a low calorie/carb diet.  Aerobic activities 5 times a week and light weightlifting every 2 days.  Other orders - Cholecalciferol (D-3-5) 125 MCG (5000 UT) capsule - clobetasol ointment (TEMOVATE) 0.05 %; Apply 1 application topically 3 (three) times a week. Apply a thin layer on the vulva and perianal area HS x 2 weeks, then continue long term 3 times a week.   Princess Bruins MD, 12:35 PM 09/04/2021

## 2022-01-21 ENCOUNTER — Other Ambulatory Visit: Payer: Self-pay | Admitting: Dermatology

## 2022-01-21 DIAGNOSIS — L578 Other skin changes due to chronic exposure to nonionizing radiation: Secondary | ICD-10-CM | POA: Diagnosis not present

## 2022-01-21 DIAGNOSIS — D225 Melanocytic nevi of trunk: Secondary | ICD-10-CM | POA: Diagnosis not present

## 2022-01-21 DIAGNOSIS — L820 Inflamed seborrheic keratosis: Secondary | ICD-10-CM | POA: Diagnosis not present

## 2022-01-21 DIAGNOSIS — L814 Other melanin hyperpigmentation: Secondary | ICD-10-CM | POA: Diagnosis not present

## 2022-01-21 DIAGNOSIS — D485 Neoplasm of uncertain behavior of skin: Secondary | ICD-10-CM | POA: Diagnosis not present

## 2022-01-21 DIAGNOSIS — L821 Other seborrheic keratosis: Secondary | ICD-10-CM | POA: Diagnosis not present

## 2022-01-23 LAB — DERMATOPATHOLOGY REPORT

## 2022-01-23 LAB — DERMPATH, SPECIMEN A

## 2022-02-07 DIAGNOSIS — D485 Neoplasm of uncertain behavior of skin: Secondary | ICD-10-CM | POA: Diagnosis not present

## 2022-02-07 DIAGNOSIS — L905 Scar conditions and fibrosis of skin: Secondary | ICD-10-CM | POA: Diagnosis not present

## 2022-02-08 IMAGING — US US BREAST*R* LIMITED INC AXILLA
1 series · 6 of 6 positions shown · non-contrast
Comparison: Previous exam(s).
COMPARISON: Previous exam(s).
COMPARISON: Previous exam(s).

Addendum:
CLINICAL DATA: History of RIGHT breast cancer in 9547 status post
breast conservation surgery and radiation therapy. Patient describes
new firmness within the periareolar region of the RIGHT breast
(infra-areolar) and additional palpable lumps within the RIGHT
axilla.

EXAM:
DIGITAL DIAGNOSTIC BILATERAL MAMMOGRAM WITH TOMOSYNTHESIS AND CAD;
ULTRASOUND RIGHT BREAST LIMITED
TECHNIQUE: Bilateral digital diagnostic mammography and breast tomosynthesis
was performed. The images were evaluated with computer-aided
detection.; Targeted ultrasound examination of the right breast was
performed

[Series 1: us breast*right* limited inc axilla · 0.07mm/px · 6 of 6 slices shown]
[im 1/6]
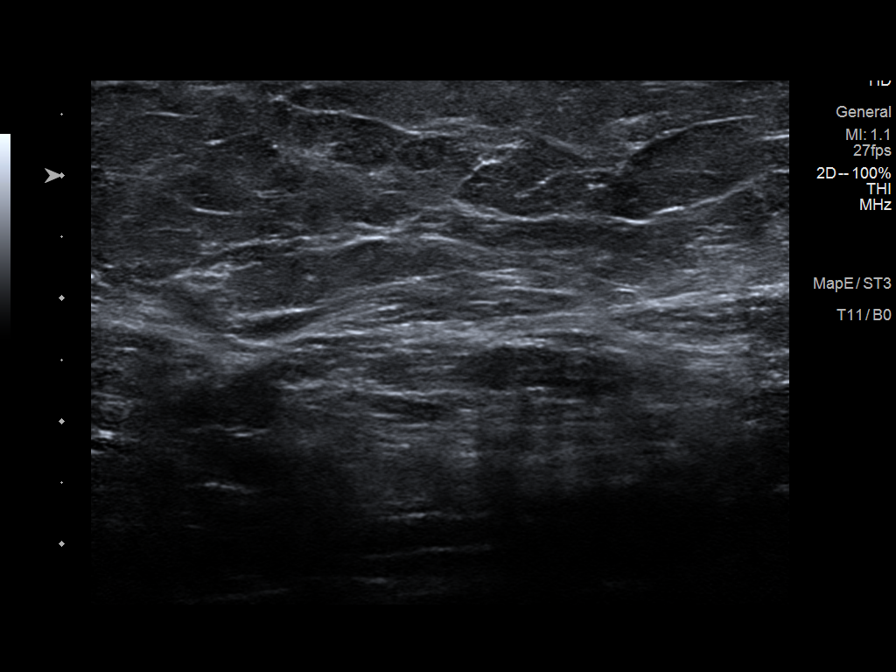
[im 2/6]
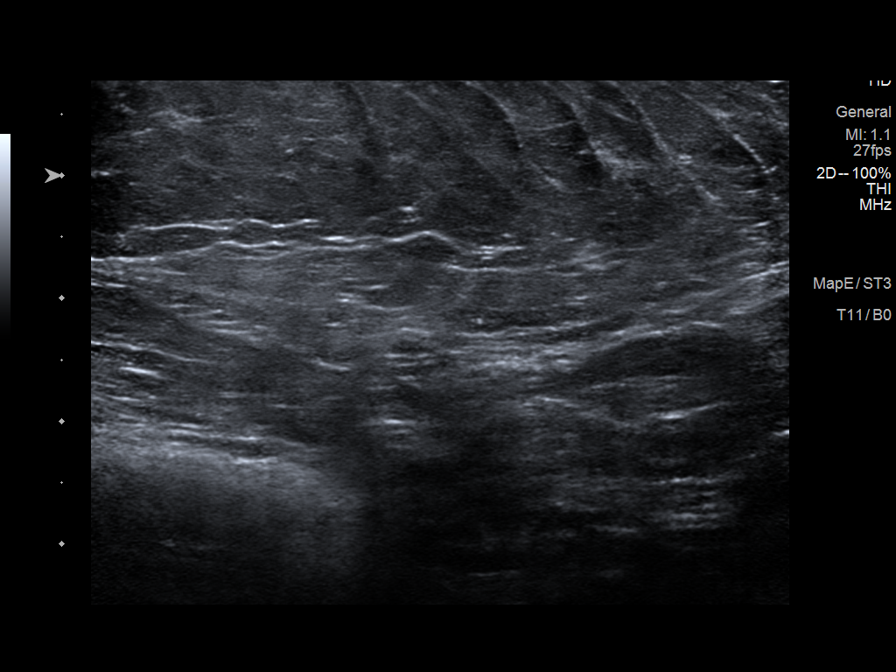
[im 3/6]
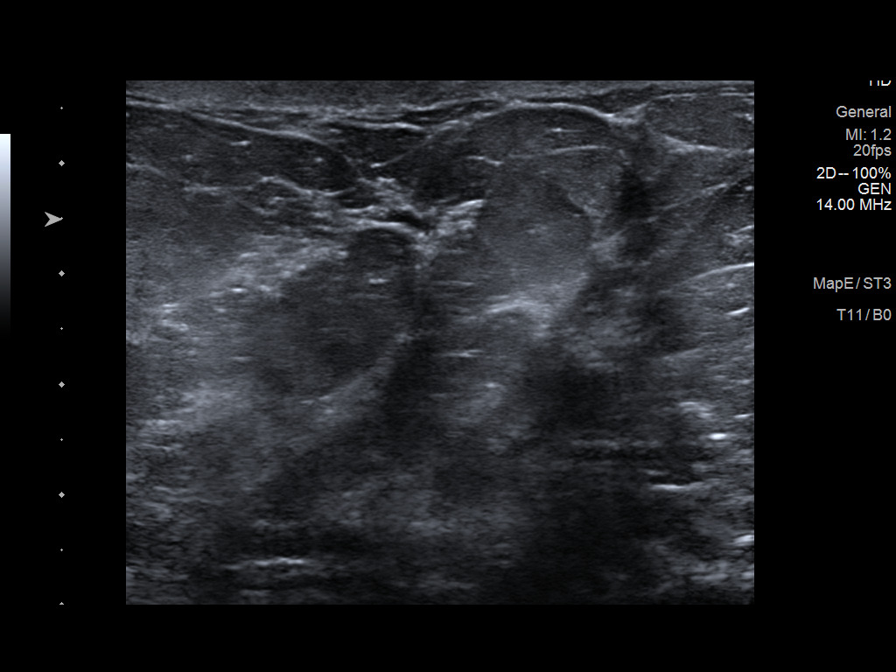
[im 4/6]
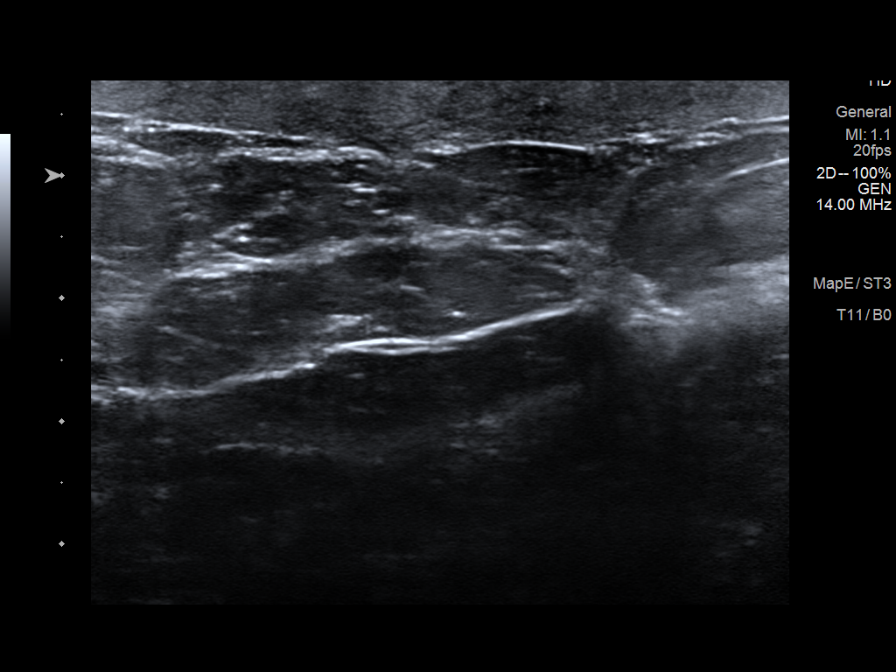
[im 5/6]
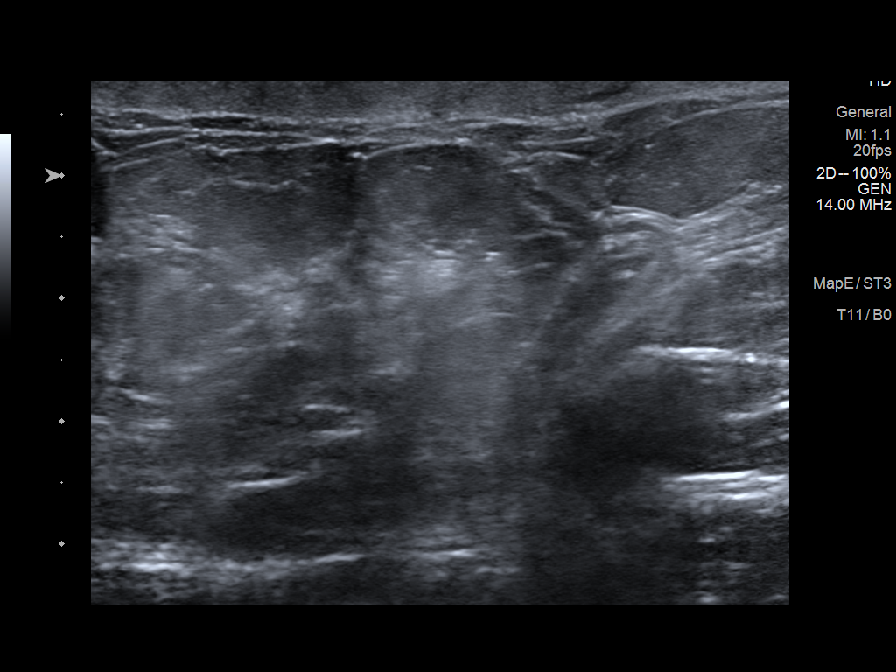
[im 6/6]
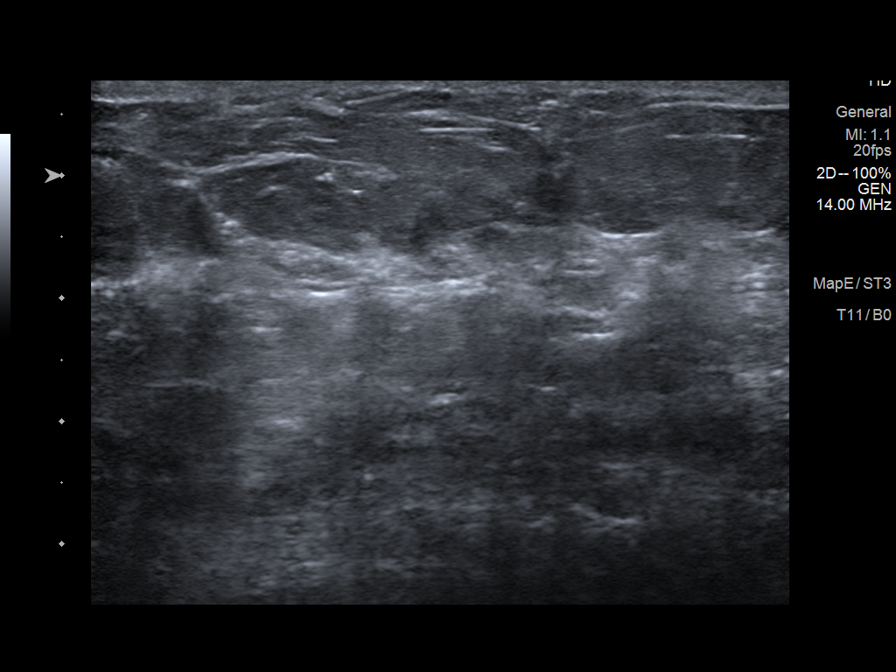

[6 of 6 positions shown; findings below may reference images not displayed]

ACR Breast Density Category b: There are scattered areas of
fibroglandular density.
FINDINGS: Bilateral diagnostic mammogram: There are stable postsurgical and
post radiation changes within the RIGHT breast. There are no new
dominant masses, suspicious calcifications or secondary signs of
malignancy identified in either breast.

On physical exam, there is skin thickening and mild peau d'[REDACTED]
appearance at the RIGHT nipple-areolar complex, corresponding to 1
of the patient's areas of clinical concern.

Targeted ultrasound is performed, evaluating the subareolar and
periareolar RIGHT breast, showing skin thickening but no underlying
solid or cystic mass. No sebaceous cyst.

Targeted ultrasound is also performed of the RIGHT axilla,
corresponding to additional palpable areas of concern described by
the patient, showing only normal soft tissues throughout. No solid
or cystic mass. No enlarged or morphologically abnormal lymph nodes.
IMPRESSION: 1. No mammographic or sonographic evidence of malignancy within the
RIGHT breast.
2. No mammographic evidence of malignancy within the LEFT breast.
3. Patient is concerned about new palpable firmness within the
infra-areolar RIGHT breast and mild peau d'[REDACTED] appearance at the
RIGHT nipple-areolar complex (patient states that this skin
appearance is new in the past year). By ultrasound, skin thickening
is confirmed but is stable mammographically for over 4 years
suggesting benign post radiation change. No underlying solid or
cystic mass is identified by ultrasound within the subareolar or
periareolar RIGHT breast.

RECOMMENDATION:
1. Surgical consultation and possible skin punch biopsy at the RIGHT
nipple-areolar complex.
2. If RIGHT breast findings are deemed benign by clinical evaluation
by the surgeon and/or skin punch biopsy, would then merely recommend
screening mammogram in one year.(Code:KW-W-OHU)

I have discussed the findings and recommendations with the patient.
If applicable, a reminder letter will be sent to the patient
regarding the next appointment.

BI-RADS CATEGORY  2: Benign.

ADDENDUM:
Our nurse navigator at the [REDACTED] will contact [REDACTED] and provide them with patient's contact
information.

ADDENDUM:
Surgical consultation has been arranged with Dr. Rain Belmontes
at [REDACTED] on July 02, 2021.

Aleksandra Shahid RN on 06/21/2021

*** End of Addendum ***
Addendum:
ACR Breast Density Category b: There are scattered areas of
fibroglandular density.
FINDINGS: Bilateral diagnostic mammogram: There are stable postsurgical and
post radiation changes within the RIGHT breast. There are no new
dominant masses, suspicious calcifications or secondary signs of
malignancy identified in either breast.

On physical exam, there is skin thickening and mild peau d'[REDACTED]
appearance at the RIGHT nipple-areolar complex, corresponding to 1
of the patient's areas of clinical concern.

Targeted ultrasound is performed, evaluating the subareolar and
periareolar RIGHT breast, showing skin thickening but no underlying
solid or cystic mass. No sebaceous cyst.

Targeted ultrasound is also performed of the RIGHT axilla,
corresponding to additional palpable areas of concern described by
the patient, showing only normal soft tissues throughout. No solid
or cystic mass. No enlarged or morphologically abnormal lymph nodes.
IMPRESSION: 1. No mammographic or sonographic evidence of malignancy within the
RIGHT breast.
2. No mammographic evidence of malignancy within the LEFT breast.
3. Patient is concerned about new palpable firmness within the
infra-areolar RIGHT breast and mild peau d'[REDACTED] appearance at the
RIGHT nipple-areolar complex (patient states that this skin
appearance is new in the past year). By ultrasound, skin thickening
is confirmed but is stable mammographically for over 4 years
suggesting benign post radiation change. No underlying solid or
cystic mass is identified by ultrasound within the subareolar or
periareolar RIGHT breast.

RECOMMENDATION:
1. Surgical consultation and possible skin punch biopsy at the RIGHT
nipple-areolar complex.
2. If RIGHT breast findings are deemed benign by clinical evaluation
by the surgeon and/or skin punch biopsy, would then merely recommend
screening mammogram in one year.(Code:KW-W-OHU)

I have discussed the findings and recommendations with the patient.
If applicable, a reminder letter will be sent to the patient
regarding the next appointment.

BI-RADS CATEGORY  2: Benign.

ADDENDUM:
Our nurse navigator at the [REDACTED] will contact [REDACTED] and provide them with patient's contact
information.

*** End of Addendum ***
ACR Breast Density Category b: There are scattered areas of
fibroglandular density.
FINDINGS: Bilateral diagnostic mammogram: There are stable postsurgical and
post radiation changes within the RIGHT breast. There are no new
dominant masses, suspicious calcifications or secondary signs of
malignancy identified in either breast.

On physical exam, there is skin thickening and mild peau d'[REDACTED]
appearance at the RIGHT nipple-areolar complex, corresponding to 1
of the patient's areas of clinical concern.

Targeted ultrasound is performed, evaluating the subareolar and
periareolar RIGHT breast, showing skin thickening but no underlying
solid or cystic mass. No sebaceous cyst.

Targeted ultrasound is also performed of the RIGHT axilla,
corresponding to additional palpable areas of concern described by
the patient, showing only normal soft tissues throughout. No solid
or cystic mass. No enlarged or morphologically abnormal lymph nodes.
IMPRESSION: 1. No mammographic or sonographic evidence of malignancy within the
RIGHT breast.
2. No mammographic evidence of malignancy within the LEFT breast.
3. Patient is concerned about new palpable firmness within the
infra-areolar RIGHT breast and mild peau d'[REDACTED] appearance at the
RIGHT nipple-areolar complex (patient states that this skin
appearance is new in the past year). By ultrasound, skin thickening
is confirmed but is stable mammographically for over 4 years
suggesting benign post radiation change. No underlying solid or
cystic mass is identified by ultrasound within the subareolar or
periareolar RIGHT breast.

RECOMMENDATION:
1. Surgical consultation and possible skin punch biopsy at the RIGHT
nipple-areolar complex.
2. If RIGHT breast findings are deemed benign by clinical evaluation
by the surgeon and/or skin punch biopsy, would then merely recommend
screening mammogram in one year.(Code:KW-W-OHU)

I have discussed the findings and recommendations with the patient.
If applicable, a reminder letter will be sent to the patient
regarding the next appointment.

BI-RADS CATEGORY  2: Benign.

## 2022-03-20 DIAGNOSIS — M546 Pain in thoracic spine: Secondary | ICD-10-CM | POA: Diagnosis not present

## 2022-03-20 DIAGNOSIS — M542 Cervicalgia: Secondary | ICD-10-CM | POA: Diagnosis not present

## 2022-03-20 DIAGNOSIS — Z6837 Body mass index (BMI) 37.0-37.9, adult: Secondary | ICD-10-CM | POA: Diagnosis not present

## 2022-03-26 DIAGNOSIS — M40204 Unspecified kyphosis, thoracic region: Secondary | ICD-10-CM | POA: Diagnosis not present

## 2022-03-26 DIAGNOSIS — M47812 Spondylosis without myelopathy or radiculopathy, cervical region: Secondary | ICD-10-CM | POA: Diagnosis not present

## 2022-03-26 DIAGNOSIS — M542 Cervicalgia: Secondary | ICD-10-CM | POA: Diagnosis not present

## 2022-03-26 DIAGNOSIS — M546 Pain in thoracic spine: Secondary | ICD-10-CM | POA: Diagnosis not present

## 2022-03-26 DIAGNOSIS — M549 Dorsalgia, unspecified: Secondary | ICD-10-CM | POA: Diagnosis not present

## 2022-05-10 ENCOUNTER — Other Ambulatory Visit: Payer: Self-pay | Admitting: Adult Health

## 2022-05-10 DIAGNOSIS — Z1231 Encounter for screening mammogram for malignant neoplasm of breast: Secondary | ICD-10-CM

## 2022-05-13 DIAGNOSIS — H35373 Puckering of macula, bilateral: Secondary | ICD-10-CM | POA: Diagnosis not present

## 2022-05-13 DIAGNOSIS — H04123 Dry eye syndrome of bilateral lacrimal glands: Secondary | ICD-10-CM | POA: Diagnosis not present

## 2022-05-13 DIAGNOSIS — H53022 Refractive amblyopia, left eye: Secondary | ICD-10-CM | POA: Diagnosis not present

## 2022-05-13 DIAGNOSIS — H25813 Combined forms of age-related cataract, bilateral: Secondary | ICD-10-CM | POA: Diagnosis not present

## 2022-05-16 DIAGNOSIS — M25562 Pain in left knee: Secondary | ICD-10-CM | POA: Diagnosis not present

## 2022-05-16 DIAGNOSIS — M25561 Pain in right knee: Secondary | ICD-10-CM | POA: Diagnosis not present

## 2022-05-16 DIAGNOSIS — M170 Bilateral primary osteoarthritis of knee: Secondary | ICD-10-CM | POA: Diagnosis not present

## 2022-05-16 DIAGNOSIS — M1712 Unilateral primary osteoarthritis, left knee: Secondary | ICD-10-CM | POA: Diagnosis not present

## 2022-05-16 DIAGNOSIS — M1711 Unilateral primary osteoarthritis, right knee: Secondary | ICD-10-CM | POA: Diagnosis not present

## 2022-06-14 ENCOUNTER — Encounter: Payer: Self-pay | Admitting: Oncology

## 2022-06-21 ENCOUNTER — Ambulatory Visit
Admission: RE | Admit: 2022-06-21 | Discharge: 2022-06-21 | Disposition: A | Discharge status: Home / Self Care | Payer: PPO | Source: Ambulatory Visit | Attending: Adult Health | Admitting: Adult Health

## 2022-06-21 DIAGNOSIS — Z1231 Encounter for screening mammogram for malignant neoplasm of breast: Secondary | ICD-10-CM | POA: Diagnosis not present

## 2022-07-03 ENCOUNTER — Ambulatory Visit: Payer: Self-pay | Admitting: Licensed Clinical Social Worker

## 2022-07-03 NOTE — Patient Outreach (Signed)
  Care Coordination  Initial Visit Note   07/03/2022 Name: Naz Denunzio MRN: [id number] DOB: Apr 12, 1951  Laketha Leopard is a 71 y.o. year old female who sees [name], MD for primary care. I spoke with  Victorino December by phone today  What matters to the patients health and wellness today?  No needs    Goals Addressed             This Visit's Progress    COMPLETED: Care Coordination Activities/ No Follow up Required       Care Coordination Interventions: Reviewed Care Coordination Services:declined         SDOH assessments and interventions completed:  No   Care Coordination Interventions Activated:  No  Care Coordination Interventions:  No, not indicated   Follow up plan: No further intervention required.   Encounter Outcome:  Pt. [place], [place] [phone]

## 2022-07-03 NOTE — Patient Instructions (Signed)
Visit Information  Thank you for taking time to talk with me today. Please don't hesitate to contact me if I can be of assistance to you.   Following are the goals we discussed today:   Goals Addressed             This Visit's Progress    COMPLETED: Care Coordination Activities/ No Follow up Required       Care Coordination Interventions: Reviewed Care Coordination Services:declined         Please call the care guide team at [phone] if you schedule an appointment to speak with once of our care coordinators to assist with navigating and managing your  chronic medical needs .   If you are experiencing a Mental Health or [place] or need someone to talk to, please call 1-800-273-TALK (toll free, 24 hour hotline)   Patient verbalizes understanding of instructions and care plan provided today and agrees to view in [place]. Active MyChart status and patient understanding of how to access instructions and care plan via MyChart confirmed with patient.     No further follow up required: Care Coordination at this time  Casimer Lanius, [place] [phone]

## 2022-09-05 ENCOUNTER — Ambulatory Visit: Payer: PPO | Admitting: Obstetrics & Gynecology

## 2022-09-09 DIAGNOSIS — Z6837 Body mass index (BMI) 37.0-37.9, adult: Secondary | ICD-10-CM | POA: Diagnosis not present

## 2022-09-09 DIAGNOSIS — M546 Pain in thoracic spine: Secondary | ICD-10-CM | POA: Diagnosis not present

## 2022-09-16 DIAGNOSIS — L0291 Cutaneous abscess, unspecified: Secondary | ICD-10-CM | POA: Diagnosis not present

## 2022-09-23 ENCOUNTER — Other Ambulatory Visit (HOSPITAL_COMMUNITY)
Admission: RE | Admit: 2022-09-23 | Discharge: 2022-09-23 | Disposition: A | Discharge status: Home / Self Care | Payer: PPO | Source: Ambulatory Visit | Attending: Obstetrics & Gynecology | Admitting: Obstetrics & Gynecology

## 2022-09-23 ENCOUNTER — Ambulatory Visit (INDEPENDENT_AMBULATORY_CARE_PROVIDER_SITE_OTHER): Payer: PPO | Admitting: Obstetrics & Gynecology

## 2022-09-23 ENCOUNTER — Other Ambulatory Visit (HOSPITAL_COMMUNITY)
Admission: RE | Admit: 2022-09-23 | Discharge: 2022-09-23 | Disposition: A | Discharge status: Home / Self Care | Payer: PPO | Source: Ambulatory Visit

## 2022-09-23 ENCOUNTER — Encounter: Payer: Self-pay | Admitting: Obstetrics & Gynecology

## 2022-09-23 VITALS — BP 128/68 | HR 93 | Resp 20 | Ht 65.16 in | Wt 232.8 lb

## 2022-09-23 DIAGNOSIS — Z171 Estrogen receptor negative status [ER-]: Secondary | ICD-10-CM

## 2022-09-23 DIAGNOSIS — N841 Polyp of cervix uteri: Secondary | ICD-10-CM | POA: Diagnosis not present

## 2022-09-23 DIAGNOSIS — Z01419 Encounter for gynecological examination (general) (routine) without abnormal findings: Secondary | ICD-10-CM

## 2022-09-23 DIAGNOSIS — M85851 Other specified disorders of bone density and structure, right thigh: Secondary | ICD-10-CM | POA: Diagnosis not present

## 2022-09-23 DIAGNOSIS — C50211 Malignant neoplasm of upper-inner quadrant of right female breast: Secondary | ICD-10-CM | POA: Diagnosis not present

## 2022-09-23 DIAGNOSIS — Z780 Asymptomatic menopausal state: Secondary | ICD-10-CM

## 2022-09-23 DIAGNOSIS — Z9189 Other specified personal risk factors, not elsewhere classified: Secondary | ICD-10-CM

## 2022-09-23 DIAGNOSIS — R300 Dysuria: Secondary | ICD-10-CM
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
## 2022-09-25 LAB — URINALYSIS, COMPLETE W/RFL CULTURE
Bilirubin Urine: NEGATIVE
Casts: NONE SEEN /LPF
Crystals: NONE SEEN /HPF
Glucose, UA: NEGATIVE
Hgb urine dipstick: NEGATIVE
Hyaline Cast: NONE SEEN /LPF
Ketones, ur: NEGATIVE
Leukocyte Esterase: NEGATIVE
Nitrites, Initial: NEGATIVE
Protein, ur: NEGATIVE
RBC / HPF: NONE SEEN /HPF (ref 0–2)
Specific Gravity, Urine: 1.025 (ref 1.001–1.035)
Yeast: NONE SEEN /HPF
pH: 5 (ref 5.0–8.0)

## 2022-09-25 LAB — URINE CULTURE
MICRO NUMBER:: 14148578
SPECIMEN QUALITY:: ADEQUATE

## 2022-09-25 LAB — CULTURE INDICATED

## 2022-09-25 LAB — CYTOLOGY - PAP: Diagnosis: NEGATIVE

## 2022-09-25 LAB — SURGICAL PATHOLOGY

## 2022-11-25 ENCOUNTER — Emergency Department (HOSPITAL_COMMUNITY): Payer: PPO

## 2022-11-25 ENCOUNTER — Other Ambulatory Visit: Payer: Self-pay

## 2022-11-25 ENCOUNTER — Encounter (HOSPITAL_COMMUNITY): Payer: Self-pay

## 2022-11-25 ENCOUNTER — Inpatient Hospital Stay (HOSPITAL_COMMUNITY)
Admission: EM | Admit: 2022-11-25 | Discharge: 2022-11-29 | DRG: 481 | Disposition: A | Discharge status: Rehabilitation Facility | Payer: PPO | Attending: Internal Medicine | Admitting: Internal Medicine

## 2022-11-25 DIAGNOSIS — Z833 Family history of diabetes mellitus: Secondary | ICD-10-CM

## 2022-11-25 DIAGNOSIS — Z885 Allergy status to narcotic agent status: Secondary | ICD-10-CM

## 2022-11-25 DIAGNOSIS — N179 Acute kidney failure, unspecified: Secondary | ICD-10-CM | POA: Diagnosis present

## 2022-11-25 DIAGNOSIS — Z923 Personal history of irradiation: Secondary | ICD-10-CM

## 2022-11-25 DIAGNOSIS — Z853 Personal history of malignant neoplasm of breast: Secondary | ICD-10-CM

## 2022-11-25 DIAGNOSIS — M80051A Age-related osteoporosis with current pathological fracture, right femur, initial encounter for fracture: Principal | ICD-10-CM

## 2022-11-25 DIAGNOSIS — R1031 Right lower quadrant pain: Secondary | ICD-10-CM | POA: Diagnosis not present

## 2022-11-25 DIAGNOSIS — E559 Vitamin D deficiency, unspecified: Secondary | ICD-10-CM | POA: Diagnosis present

## 2022-11-25 DIAGNOSIS — M79674 Pain in right toe(s): Secondary | ICD-10-CM | POA: Diagnosis not present

## 2022-11-25 DIAGNOSIS — S72141A Displaced intertrochanteric fracture of right femur, initial encounter for closed fracture: Secondary | ICD-10-CM | POA: Diagnosis present

## 2022-11-25 DIAGNOSIS — M79604 Pain in right leg: Secondary | ICD-10-CM | POA: Diagnosis not present

## 2022-11-25 DIAGNOSIS — M79609 Pain in unspecified limb: Secondary | ICD-10-CM | POA: Diagnosis not present

## 2022-11-25 DIAGNOSIS — S3993XA Unspecified injury of pelvis, initial encounter: Secondary | ICD-10-CM | POA: Diagnosis not present

## 2022-11-25 DIAGNOSIS — W19XXXA Unspecified fall, initial encounter: Secondary | ICD-10-CM | POA: Diagnosis not present

## 2022-11-25 DIAGNOSIS — Z6838 Body mass index (BMI) 38.0-38.9, adult: Secondary | ICD-10-CM

## 2022-11-25 DIAGNOSIS — S72001A Fracture of unspecified part of neck of right femur, initial encounter for closed fracture: Principal | ICD-10-CM

## 2022-11-25 DIAGNOSIS — Y92007 Garden or yard of unspecified non-institutional (private) residence as the place of occurrence of the external cause: Secondary | ICD-10-CM

## 2022-11-25 DIAGNOSIS — S7221XA Displaced subtrochanteric fracture of right femur, initial encounter for closed fracture: Secondary | ICD-10-CM | POA: Diagnosis not present

## 2022-11-25 DIAGNOSIS — G8911 Acute pain due to trauma: Secondary | ICD-10-CM | POA: Diagnosis not present

## 2022-11-25 DIAGNOSIS — K5900 Constipation, unspecified: Secondary | ICD-10-CM | POA: Diagnosis not present

## 2022-11-25 DIAGNOSIS — R799 Abnormal finding of blood chemistry, unspecified: Secondary | ICD-10-CM | POA: Diagnosis not present

## 2022-11-25 DIAGNOSIS — K429 Umbilical hernia without obstruction or gangrene: Secondary | ICD-10-CM | POA: Diagnosis not present

## 2022-11-25 DIAGNOSIS — T451X5A Adverse effect of antineoplastic and immunosuppressive drugs, initial encounter: Secondary | ICD-10-CM | POA: Diagnosis not present

## 2022-11-25 DIAGNOSIS — W010XXA Fall on same level from slipping, tripping and stumbling without subsequent striking against object, initial encounter: Secondary | ICD-10-CM | POA: Diagnosis present

## 2022-11-25 DIAGNOSIS — Z043 Encounter for examination and observation following other accident: Secondary | ICD-10-CM | POA: Diagnosis not present

## 2022-11-25 DIAGNOSIS — S199XXA Unspecified injury of neck, initial encounter: Secondary | ICD-10-CM | POA: Diagnosis not present

## 2022-11-25 DIAGNOSIS — Z8249 Family history of ischemic heart disease and other diseases of the circulatory system: Secondary | ICD-10-CM | POA: Diagnosis not present

## 2022-11-25 DIAGNOSIS — T68XXXA Hypothermia, initial encounter: Secondary | ICD-10-CM | POA: Diagnosis not present

## 2022-11-25 DIAGNOSIS — D62 Acute posthemorrhagic anemia: Secondary | ICD-10-CM | POA: Diagnosis not present

## 2022-11-25 DIAGNOSIS — S72301A Unspecified fracture of shaft of right femur, initial encounter for closed fracture: Secondary | ICD-10-CM | POA: Diagnosis not present

## 2022-11-25 DIAGNOSIS — L84 Corns and callosities: Secondary | ICD-10-CM | POA: Diagnosis not present

## 2022-11-25 DIAGNOSIS — G620 Drug-induced polyneuropathy: Secondary | ICD-10-CM | POA: Diagnosis not present

## 2022-11-25 DIAGNOSIS — M25572 Pain in left ankle and joints of left foot: Secondary | ICD-10-CM | POA: Diagnosis not present

## 2022-11-25 DIAGNOSIS — D72829 Elevated white blood cell count, unspecified: Secondary | ICD-10-CM | POA: Diagnosis not present

## 2022-11-25 DIAGNOSIS — S7221XD Displaced subtrochanteric fracture of right femur, subsequent encounter for closed fracture with routine healing: Secondary | ICD-10-CM | POA: Diagnosis not present

## 2022-11-25 DIAGNOSIS — Z9011 Acquired absence of right breast and nipple: Secondary | ICD-10-CM | POA: Diagnosis not present

## 2022-11-25 DIAGNOSIS — S72141D Displaced intertrochanteric fracture of right femur, subsequent encounter for closed fracture with routine healing: Secondary | ICD-10-CM | POA: Diagnosis not present

## 2022-11-25 DIAGNOSIS — S72009A Fracture of unspecified part of neck of unspecified femur, initial encounter for closed fracture: Secondary | ICD-10-CM

## 2022-11-25 DIAGNOSIS — S0990XA Unspecified injury of head, initial encounter: Secondary | ICD-10-CM | POA: Diagnosis not present

## 2022-11-25 DIAGNOSIS — S72111A Displaced fracture of greater trochanter of right femur, initial encounter for closed fracture: Secondary | ICD-10-CM | POA: Diagnosis not present

## 2022-11-25 DIAGNOSIS — S7224XD Nondisplaced subtrochanteric fracture of right femur, subsequent encounter for closed fracture with routine healing: Secondary | ICD-10-CM | POA: Diagnosis not present

## 2022-11-25 DIAGNOSIS — Z9221 Personal history of antineoplastic chemotherapy: Secondary | ICD-10-CM | POA: Diagnosis not present

## 2022-11-25 DIAGNOSIS — R0902 Hypoxemia: Secondary | ICD-10-CM | POA: Diagnosis not present

## 2022-11-25 DIAGNOSIS — R000 Tachycardia, unspecified: Secondary | ICD-10-CM | POA: Diagnosis not present

## 2022-11-25 DIAGNOSIS — K5901 Slow transit constipation: Secondary | ICD-10-CM | POA: Diagnosis not present

## 2022-11-25 DIAGNOSIS — S299XXA Unspecified injury of thorax, initial encounter: Secondary | ICD-10-CM | POA: Diagnosis not present

## 2022-11-25 DIAGNOSIS — Z79899 Other long term (current) drug therapy: Secondary | ICD-10-CM | POA: Diagnosis not present

## 2022-11-25 DIAGNOSIS — K5730 Diverticulosis of large intestine without perforation or abscess without bleeding: Secondary | ICD-10-CM | POA: Diagnosis not present

## 2022-11-25 DIAGNOSIS — M25551 Pain in right hip: Secondary | ICD-10-CM | POA: Diagnosis not present

## 2022-11-25 LAB — COMPREHENSIVE METABOLIC PANEL
ALT: 22 U/L (ref 0–44)
AST: 39 U/L (ref 15–41)
Albumin: 4.1 g/dL (ref 3.5–5.0)
Alkaline Phosphatase: 54 U/L (ref 38–126)
Anion gap: 15 (ref 5–15)
BUN: 21 mg/dL (ref 8–23)
CO2: 21 mmol/L — ABNORMAL LOW (ref 22–32)
Calcium: 9.4 mg/dL (ref 8.9–10.3)
Chloride: 102 mmol/L (ref 98–111)
Creatinine, Ser: 0.78 mg/dL (ref 0.44–1.00)
GFR, Estimated: >60 mL/min (ref >60)
Glucose, Bld: 87 mg/dL (ref 70–99)
Potassium: 3.7 mmol/L (ref 3.5–5.1)
Sodium: 138 mmol/L (ref 135–145)
Total Bilirubin: 2.4 mg/dL — ABNORMAL HIGH (ref 0.3–1.2)
Total Protein: 7.2 g/dL (ref 6.5–8.1)

## 2022-11-25 LAB — PROCALCITONIN: Procalcitonin: <0.1 ng/mL

## 2022-11-25 LAB — CBC
HCT: 49.7 % — ABNORMAL HIGH (ref 36.0–46.0)
Hemoglobin: 15.5 g/dL — ABNORMAL HIGH (ref 12.0–15.0)
MCH: 26.8 pg (ref 26.0–34.0)
MCHC: 31.2 g/dL (ref 30.0–36.0)
MCV: 86 fL (ref 80.0–100.0)
Platelets: 368 10*3/uL (ref 150–400)
RBC: 5.78 MIL/uL — ABNORMAL HIGH (ref 3.87–5.11)
RDW: 13.7 % (ref 11.5–15.5)
WBC: 23.2 10*3/uL — ABNORMAL HIGH (ref 4.0–10.5)
nRBC: 0 % (ref 0.0–0.2)

## 2022-11-25 LAB — SAMPLE TO BLOOD BANK

## 2022-11-25 LAB — I-STAT CHEM 8, ED
BUN: 24 mg/dL — ABNORMAL HIGH (ref 8–23)
Calcium, Ion: 1.1 mmol/L — ABNORMAL LOW (ref 1.15–1.40)
Chloride: 103 mmol/L (ref 98–111)
Creatinine, Ser: 0.7 mg/dL (ref 0.44–1.00)
Glucose, Bld: 88 mg/dL (ref 70–99)
HCT: 48 % — ABNORMAL HIGH (ref 36.0–46.0)
Hemoglobin: 16.3 g/dL — ABNORMAL HIGH (ref 12.0–15.0)
Potassium: 3.7 mmol/L (ref 3.5–5.1)
Sodium: 139 mmol/L (ref 135–145)
TCO2: 22 mmol/L (ref 22–32)

## 2022-11-25 LAB — URINALYSIS, ROUTINE W REFLEX MICROSCOPIC
Bilirubin Urine: NEGATIVE
Glucose, UA: NEGATIVE mg/dL
Hgb urine dipstick: NEGATIVE
Ketones, ur: 20 mg/dL — AB
Leukocytes,Ua: NEGATIVE
Nitrite: NEGATIVE
Protein, ur: 30 mg/dL — AB
Specific Gravity, Urine: 1.024 (ref 1.005–1.030)
pH: 5 (ref 5.0–8.0)

## 2022-11-25 LAB — PROTIME-INR
INR: 1.1 (ref 0.8–1.2)
Prothrombin Time: 14.2 seconds (ref 11.4–15.2)

## 2022-11-25 LAB — ALBUMIN: Albumin: 3.5 g/dL (ref 3.5–5.0)

## 2022-11-25 LAB — TYPE AND SCREEN
ABO/RH(D): B POS
Antibody Screen: NEGATIVE

## 2022-11-25 LAB — ABO/RH: ABO/RH(D): B POS

## 2022-11-25 LAB — LACTIC ACID, PLASMA
Lactic Acid, Venous: 3.4 mmol/L (ref 0.5–1.9)
Lactic Acid, Venous: 2.4 mmol/L (ref 0.5–1.9)
Lactic Acid, Venous: 1.4 mmol/L (ref 0.5–1.9)

## 2022-11-25 LAB — C-REACTIVE PROTEIN: CRP: 8 mg/dL — ABNORMAL HIGH (ref <1.0)

## 2022-11-25 LAB — D-DIMER, QUANTITATIVE: D-Dimer, Quant: 1.62 ug/mL-FEU — ABNORMAL HIGH (ref 0.00–0.50)

## 2022-11-25 LAB — CK: Total CK: 554 U/L — ABNORMAL HIGH (ref 38–234)

## 2022-11-25 LAB — ETHANOL: Alcohol, Ethyl (B): <10 mg/dL (ref <10)

## 2022-11-25 LAB — VITAMIN D 25 HYDROXY (VIT D DEFICIENCY, FRACTURES): Vit D, 25-Hydroxy: 18.39 ng/mL — ABNORMAL LOW (ref 30–100)

## 2022-11-25 MED ORDER — SODIUM CHLORIDE 0.9 % IV SOLN: INTRAVENOUS | Status: AC

## 2022-11-25 MED ORDER — FENTANYL CITRATE PF 50 MCG/ML IJ SOSY: 25.0000 ug | PREFILLED_SYRINGE | INTRAMUSCULAR | Status: DC | PRN

## 2022-11-25 MED ORDER — KETOROLAC TROMETHAMINE 15 MG/ML IJ SOLN
15.0000 mg | Freq: Once | INTRAMUSCULAR | Status: AC
  Administered 2022-11-25: 15 mg via INTRAVENOUS
  Filled 2022-11-25: qty 1

## 2022-11-25 MED ORDER — FENTANYL CITRATE PF 50 MCG/ML IJ SOSY
50.0000 ug | PREFILLED_SYRINGE | Freq: Once | INTRAMUSCULAR | Status: AC
  Administered 2022-11-25: 50 ug via INTRAVENOUS
  Filled 2022-11-25: qty 1

## 2022-11-25 MED ORDER — ACETAMINOPHEN 325 MG PO TABS
650.0000 mg | ORAL_TABLET | Freq: Once | ORAL | Status: AC
  Administered 2022-11-25: 650 mg via ORAL
  Filled 2022-11-25: qty 2

## 2022-11-25 MED ORDER — POLYETHYLENE GLYCOL 3350 17 G PO PACK: 17.0000 g | PACK | Freq: Every day | ORAL | Status: DC | PRN

## 2022-11-25 MED ORDER — HYDROCODONE-ACETAMINOPHEN 5-325 MG PO TABS
1.0000 | ORAL_TABLET | Freq: Four times a day (QID) | ORAL | Status: DC | PRN
  Administered 2022-11-26: 2 via ORAL
  Filled 2022-11-25: qty 2

## 2022-11-25 MED ORDER — LACTATED RINGERS IV BOLUS
1000.0000 mL | Freq: Once | INTRAVENOUS | Status: AC
  Administered 2022-11-25: 1000 mL via INTRAVENOUS

## 2022-11-25 MED ORDER — SODIUM CHLORIDE 0.9 % IV BOLUS
1000.0000 mL | Freq: Once | INTRAVENOUS | Status: AC
  Administered 2022-11-26: 1000 mL via INTRAVENOUS

## 2022-11-25 MED ORDER — DOCUSATE SODIUM 100 MG PO CAPS
100.0000 mg | ORAL_CAPSULE | Freq: Two times a day (BID) | ORAL | Status: DC
  Administered 2022-11-25 – 2022-11-29 (×8): 100 mg via ORAL
  Filled 2022-11-25 (×8): qty 1
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
## 2022-11-25 NOTE — Progress Notes (Signed)
Orthopedic Tech Progress Note Patient Details:  Terri Mayer 04-22-1951 [id number] Level 2 Trauma Patient ID: Terri Mayer, female   DOB: 08-28-1951, 72 y.o.   MRN: [id number]  Terri Mayer 11/25/2022, 6:10 PM

## 2022-11-25 NOTE — H&P (Addendum)
History and Physical    Terri Mayer ZOX:[id number] DOB: [birth year] DOA: 11/25/2022  PCP: [name], MD  Patient coming from: home   I have personally briefly reviewed patient's old medical records in [place]  Chief Complaint: fall/found down / hip pain   HPI: Terri Mayer is a 73 y.o. female with medical history significant of  BRCA in remission, who presents to ED BIB EMS after she was found down outside in her backyard. Per patient yesterday she went to her shed to put away Christmas Decorations and tripped over a box and fell. She states she instantly noted pain in her right leg and unable to get up.  She states she outside o the from [place] until she was found this am by a neighbor. IN the field she was noted to have stable vital signs , alert and oriented with noted shortening of her Rt leg. Patient currently noted no chest pain , sob fever chills/n/v/d /abdominal pain, and note her leg pain is well controlled.  ED Course:  IN ED  Vitals: Afeb, bp 136/88, sat 94% on ra Hr 132  rr 22-28 NA 139, K 3.7, CL 103, cr 0.7,  Hgb 16.3,  WBC 23.2, Hgb 15.5 , pt 368  ETOH <10 Na 138, K 3.7, bicarb 21 cr 0.78, ast 39, alt 22 CK 554 Cxr: NAD Pelvix xray: Acute comminuted and displaced right intertrochanteric fracture.  ct pelvis:1. Acute comminuted right femoral intratrochanteric fracture.  CTH/Cervical spine: NAD Inr 1.1 Lactic 3.4 Sinus tachycardia, PAC , QT 505 Tx Fentanyl 80mg, 1LR, tylenol , Ketorolac,fentanyl    Review of Systems: As per HPI otherwise 10 point review of systems negative.   Past Medical History:  Diagnosis Date   Allergy    [place]    History of chicken pox    Osteoarthritis    Neck,Back,Knees,Hips,Ankles   Personal history of chemotherapy    Personal history of radiation therapy     Past Surgical History:  Procedure Laterality Date   BREAST LUMPECTOMY Right 2017   DILATION AND CURETTAGE OF UTERUS     EYE  SURGERY  at age 72  congenital eye problem   PORTACATH PLACEMENT Right 12/11/2015   Procedure: INSERTION PORT-A-CATH WITH ULTRASOUND;  Surgeon: MRolm Bookbinder MD;  [facility]  Service: General;  Laterality: Right;   RADIOACTIVE SEED GUIDED PARTIAL MASTECTOMY/AXILLARY SENTINEL NODE BIOPSY/AXILLARY NODE DISSECTION Right 05/02/2016   Procedure: RIGHT BREAST SEED GUIDED LUMPECTOMY AND RIGHT SEED TARGETED AXILLARY DISSECTION,AND SENTINEL NODE BIOPSY;  Surgeon: MRolm Bookbinder MD;  [facility]  Service: General;  Laterality: Right;  RIGHT BREAST SEED GUIDED LUMPECTOMY AND RIGHT SEED TARGETED AXILLARY DISSECTION,AND SENTINEL NODE BIOPSY   WISDOM TOOTH EXTRACTION       reports that she has never smoked. She has never used smokeless tobacco. She reports that she does not currently use alcohol. She reports that she does not currently use drugs.  Allergies  Allergen Reactions   Codeine Nausea And Vomiting    Family History  Problem Relation Age of Onset   Heart disease Father    Diabetes Other        GM   Dementia Other        M   Transient ischemic attack Other        F   Charcot-Marie-Tooth disease Brother    Colon cancer Neg Hx    Breast cancer Neg Hx  Prior to Admission medications   Medication Sig Start Date End Date Taking? Authorizing Provider  Cholecalciferol (D-3-5) 125 MCG (5000 UT) capsule Take 1,000 Units by mouth once a week. 12/29/17   [provider]  clobetasol ointment (TEMOVATE) 6.80 % Apply 1 application topically 3 (three) times a week. Apply a thin layer on the vulva and perianal area HS x 2 weeks, then continue long term 3 times a week. 09/05/21   Princess Bruins, MD  cyclobenzaprine (FLEXERIL) 10 MG tablet Take 10 mg by mouth 3 (three) times daily as needed for muscle spasms.    [provider]  doxycycline (VIBRA-TABS) 100 MG tablet Take 100 mg by mouth 2 (two) times daily. 09/16/22    [provider]  HYDROcodone-acetaminophen (NORCO/VICODIN) 5-325 MG tablet Take 1 tablet by mouth every 6 (six) hours as needed.    [provider]  ibuprofen (ADVIL,MOTRIN) 200 MG tablet Take 200 mg by mouth every 6 (six) hours as needed for fever or mild pain. Reported on 03/22/2016    [provider]  Multiple Vitamin (MULTIVITAMIN) tablet Take 1 tablet by mouth daily.    [provider]    Physical Exam: Vitals:   11/25/22 1915 11/25/22 1930 11/25/22 1945 11/25/22 2000  BP: (!) 147/124 (!) 145/67  (!) 139/49  Pulse: 100 (!) 108 (!) 107 (!) 128  Resp: '17 18 20 15  '$ Temp:      TempSrc:      SpO2: 97% 99% 97% 98%  Weight:      Height:        Constitutional: NAD, calm, comfortable Vitals:   11/25/22 1915 11/25/22 1930 11/25/22 1945 11/25/22 2000  BP: (!) 147/124 (!) 145/67  (!) 139/49  Pulse: 100 (!) 108 (!) 107 (!) 128  Resp: '17 18 20 15  '$ Temp:      TempSrc:      SpO2: 97% 99% 97% 98%  Weight:      Height:       Eyes: PERRL, lids and conjunctivae normal ENMT: Mucous membranes are moist. Posterior pharynx clear of any exudate or lesions.Normal dentition.  Neck: normal, supple, no masses, no thyromegaly Respiratory: clear to auscultation bilaterally, no wheezing, no crackles. Normal respiratory effort. No accessory muscle use.  Cardiovascular: Regular rate and rhythm, no murmurs / rubs / gallops. No extremity edema. 2+ pedal pulses. Abdomen: no tenderness, no masses palpated. No hepatosplenomegaly. Bowel sounds positive.  Musculoskeletal: no clubbing / cyanosis. no contractures. Normal muscle tone.  Right let externally rotated and shorter than left Skin: no rashes, lesions, ulcers. No induration Neurologic: CN 2-12 grossly intact. Sensation intact   Psychiatric: Normal judgment and insight. Alert and oriented x 3. Normal mood.    Labs on Admission: I have personally reviewed following labs and imaging studies  CBC: Recent Labs  Lab  11/25/22 1749 11/25/22 1751  WBC  --  23.2*  HGB 16.3* 15.5*  HCT 48.0* 49.7*  MCV  --  86.0  PLT  --  321   Basic Metabolic Panel: Recent Labs  Lab 11/25/22 1749 11/25/22 1751  NA 139 138  K 3.7 3.7  CL 103 102  CO2  --  21*  GLUCOSE 88 87  BUN 24* 21  CREATININE 0.70 0.78  CALCIUM  --  9.4   GFR: Estimated Creatinine Clearance: 76.9 mL/min (by C-G formula based on SCr of 0.78 mg/dL). Liver Function Tests: Recent Labs  Lab 11/25/22 1751  AST 39  ALT 22  ALKPHOS 54  BILITOT 2.4*  PROT 7.2  ALBUMIN 4.1   No results for input(s): "LIPASE", "AMYLASE" in the last 168 hours. No results for input(s): "AMMONIA" in the last 168 hours. Coagulation Profile: Recent Labs  Lab 11/25/22 1821  INR 1.1   Cardiac Enzymes: Recent Labs  Lab 11/25/22 1751  CKTOTAL 554*   BNP (last 3 results) No results for input(s): "PROBNP" in the last 8760 hours. HbA1C: No results for input(s): "HGBA1C" in the last 72 hours. CBG: No results for input(s): "GLUCAP" in the last 168 hours. Lipid Profile: No results for input(s): "CHOL", "HDL", "LDLCALC", "TRIG", "CHOLHDL", "LDLDIRECT" in the last 72 hours. Thyroid Function Tests: No results for input(s): "TSH", "T4TOTAL", "FREET4", "T3FREE", "THYROIDAB" in the last 72 hours. Anemia Panel: No results for input(s): "VITAMINB12", "FOLATE", "FERRITIN", "TIBC", "IRON", "RETICCTPCT" in the last 72 hours. Urine analysis:    Component Value Date/Time   COLORURINE YELLOW 09/23/2022 1510   APPEARANCEUR SLIGHTLY CLOUDY (A) 09/23/2022 1510   LABSPEC 1.025 09/23/2022 1510   PHURINE 5.0 09/23/2022 1510   GLUCOSEU NEGATIVE 09/23/2022 1510   HGBUR NEGATIVE 09/23/2022 1510   BILIRUBINUR neg 12/29/2015 1014   KETONESUR NEGATIVE 09/23/2022 1510   PROTEINUR NEGATIVE 09/23/2022 1510   UROBILINOGEN negative 12/29/2015 1014   NITRITE neg 12/29/2015 1014   LEUKOCYTESUR Negative 12/29/2015 1014    Radiological Exams on Admission: CT HEAD WO  CONTRAST  Result Date: 11/25/2022 CLINICAL DATA:  Head trauma, moderate-severe; Polytrauma, blunt EXAM: CT HEAD WITHOUT CONTRAST CT CERVICAL SPINE WITHOUT CONTRAST TECHNIQUE: Multidetector CT imaging of the head and cervical spine was performed following the standard protocol without intravenous contrast. Multiplanar CT image reconstructions of the cervical spine were also generated. RADIATION DOSE REDUCTION: This exam was performed according to the departmental dose-optimization program which includes automated exposure control, adjustment of the mA and/or kV according to patient size and/or use of iterative reconstruction technique. COMPARISON:  None Available. FINDINGS: CT HEAD FINDINGS Brain: No evidence of large-territorial acute infarction. No parenchymal hemorrhage. No mass lesion. No extra-axial collection. Empty sella. No mass effect or midline shift. No hydrocephalus. Basilar cisterns are patent. Vascular: No hyperdense vessel. Skull: No acute fracture or focal lesion. Sinuses/Orbits: Right maxillary sinus polypoid like mucosal thickening. Otherwise paranasal sinuses and mastoid air cells are clear. The orbits are unremarkable. Other: None. CT CERVICAL SPINE FINDINGS Alignment: Normal. Skull base and vertebrae: Multilevel moderate degenerative changes of the spine. No associated severe osseous neural foraminal or central canal stenosis. No acute fracture. No aggressive appearing focal osseous lesion or focal pathologic process. Soft tissues and spinal canal: No prevertebral fluid or swelling. No visible canal hematoma. Upper chest: Unremarkable. Other: None. IMPRESSION: 1. No acute intracranial abnormality. 2. No acute displaced fracture or traumatic listhesis of the cervical spine. 3. Empty sella. Findings is often a normal anatomic variant but can be associated with idiopathic intracranial hypertension (pseudotumor cerebri). Electronically Signed   By: Iven Finn M.D.   On: 11/25/2022 19:21   CT  CERVICAL SPINE WO CONTRAST  Result Date: 11/25/2022 CLINICAL DATA:  Head trauma, moderate-severe; Polytrauma, blunt EXAM: CT HEAD WITHOUT CONTRAST CT CERVICAL SPINE WITHOUT CONTRAST TECHNIQUE: Multidetector CT imaging of the head and cervical spine was performed following the standard protocol without intravenous contrast. Multiplanar CT image reconstructions of the cervical spine were also generated. RADIATION DOSE REDUCTION: This exam was performed according to the departmental dose-optimization program which includes automated exposure control, adjustment of the mA and/or kV according to patient size and/or use of iterative reconstruction  technique. COMPARISON:  None Available. FINDINGS: CT HEAD FINDINGS Brain: No evidence of large-territorial acute infarction. No parenchymal hemorrhage. No mass lesion. No extra-axial collection. Empty sella. No mass effect or midline shift. No hydrocephalus. Basilar cisterns are patent. Vascular: No hyperdense vessel. Skull: No acute fracture or focal lesion. Sinuses/Orbits: Right maxillary sinus polypoid like mucosal thickening. Otherwise paranasal sinuses and mastoid air cells are clear. The orbits are unremarkable. Other: None. CT CERVICAL SPINE FINDINGS Alignment: Normal. Skull base and vertebrae: Multilevel moderate degenerative changes of the spine. No associated severe osseous neural foraminal or central canal stenosis. No acute fracture. No aggressive appearing focal osseous lesion or focal pathologic process. Soft tissues and spinal canal: No prevertebral fluid or swelling. No visible canal hematoma. Upper chest: Unremarkable. Other: None. IMPRESSION: 1. No acute intracranial abnormality. 2. No acute displaced fracture or traumatic listhesis of the cervical spine. 3. Empty sella. Findings is often a normal anatomic variant but can be associated with idiopathic intracranial hypertension (pseudotumor cerebri). Electronically Signed   By: Iven Finn M.D.   On:  11/25/2022 19:21   CT PELVIS WO CONTRAST  Result Date: 11/25/2022 CLINICAL DATA:  Pelvic fracture.  Fall. EXAM: CT PELVIS WITHOUT CONTRAST TECHNIQUE: Multidetector CT imaging of the pelvis was performed following the standard protocol without intravenous contrast. RADIATION DOSE REDUCTION: This exam was performed according to the departmental dose-optimization program which includes automated exposure control, adjustment of the mA and/or kV according to patient size and/or use of iterative reconstruction technique. COMPARISON:  Pelvis x-ray same day. FINDINGS: Urinary Tract:  No abnormality visualized. Bowel: No acute abnormality. There is sigmoid colon diverticulosis. Vascular/Lymphatic: There are atherosclerotic calcifications of the aorta. No enlarged lymph nodes are seen. Reproductive:  Ovaries and uterus are within normal limits. Other: No pelvic free fluid. Small fat containing umbilical hernia. Musculoskeletal: There is an acute comminuted right femoral intratrochanteric fracture. There is 2 cm of medial displacement of the distal fracture fragment. There is no dislocation. Joint spaces are well maintained. Degenerative changes affect the sacroiliac joints and lower lumbar facet joints. IMPRESSION: 1. Acute comminuted right femoral intratrochanteric fracture. Aortic Atherosclerosis (ICD10-I70.0). Electronically Signed   By: Ronney Asters M.D.   On: 11/25/2022 18:52   DG FEMUR PORT 1V LEFT  Result Date: 11/25/2022 CLINICAL DATA:  Fall EXAM: LEFT FEMUR PORTABLE 1 VIEW COMPARISON:  None Available. FINDINGS: Femoral head projects in joint. No fracture or malalignment. Moderate degenerative changes at the knee IMPRESSION: No acute osseous abnormality Electronically Signed   By: Donavan Foil M.D.   On: 11/25/2022 18:38   DG FEMUR PORT, 1V RIGHT  Result Date: 11/25/2022 CLINICAL DATA:  Fall EXAM: RIGHT FEMUR PORTABLE 1 VIEW COMPARISON:  None Available. FINDINGS: Acute comminuted and displaced right  intertrochanteric fracture. Mid to distal femur appears intact. No femoral head dislocation IMPRESSION: Acute comminuted and displaced right intertrochanteric fracture. Electronically Signed   By: Donavan Foil M.D.   On: 11/25/2022 18:38   DG Pelvis Portable  Result Date: 11/25/2022 CLINICAL DATA:  Trauma EXAM: PORTABLE PELVIS 1-2 VIEWS COMPARISON:  None Available. FINDINGS: Limited by habitus. SI joints are non widened. Pubic symphysis and rami appear intact. Acute comminuted and displaced right intertrochanteric fracture. No femoral head dislocation IMPRESSION: Acute comminuted and displaced right intertrochanteric fracture. Electronically Signed   By: Donavan Foil M.D.   On: 11/25/2022 18:37   DG Chest Port 1 View  Result Date: 11/25/2022 CLINICAL DATA:  Trauma EXAM: PORTABLE CHEST 1 VIEW COMPARISON:  Chest x-ray 03/19/2016  FINDINGS: Cardiomediastinal silhouette is within normal limits for patient positioning. Is no focal lung infiltrate, pleural effusion or pneumothorax on this supine view. Surgical clips are seen in the right axilla. No acute fractures are identified. IMPRESSION: 1. No acute cardiopulmonary process. 2. No acute fractures are identified. Electronically Signed   By: Ronney Asters M.D.   On: 11/25/2022 18:37    EKG: Independently reviewed.   Assessment/Plan   -admit to tele  -place on hip fracture protocol  -ortho consult Xu awaiting further recs -supportive with pain medications  -patient is cleared for surgery and is a low risk for low-intermediate surgery  3.9% 30 day risk of death, MI or cardiac arrest based on Cardiac risk index for pre-operative risk  Leukocytosis -no fever/ no sign of infection  -thought to be hemo concentration in setting of stress response -monitor for further signs of clinical infection  -check inflammatory markers to be complete  BRCA -in remission   DVT prophylaxis: scd/per orthopedics Code Status: full/ as discussed per patient wishes  in event of cardiac arrest  Family Communication: none at bedside Disposition Plan: patient  expected to be admitted greater than 2 midnights  Consults called: DR Erlinda Hong ortho Admission status: med tele  Clance Boll MD [facility]   If 7PM-7AM, please contact night-coverage [email or web address] Password [facility]  11/25/2022, 8:35 PM

## 2022-11-25 NOTE — H&P (Incomplete)
History and Physical    Kjersten Ormiston QQV:[id number] DOB: [birth year] DOA: 11/25/2022  PCP: [name], MD  Patient coming from: home   I have personally briefly reviewed patient's old medical records in [place]  Chief Complaint: fall/found down / hip pain   HPI: Terri Mayer is a 72 y.o. female with medical history significant of  BRCA in remission, who presents to ED BIB EMS after she was found down outside in her backyard. Per patient yesterday she went to her shed to put away Christmas Decorations and tripped over a box and fell. She states she instantly noted pain in her right leg and unable to get up.  She states she outside o the from [place] until she was found this am by a neighbor. IN the field she was noted to have stable vital signs , alert and oriented with noted shortening of her Rt leg.  ED Course:  IN ED  Vitals: Afeb, bp 136/88, sat 94% on ra Hr 132  rr 22-28 NA 139, K 3.7, CL 103, cr 0.7,  Hgb 16.3,  WBC 23.2, Hgb 15.5 , pt 368  ETOH <10 Na 138, K 3.7, bicarb 21 cr 0.78, ast 39, alt 22 CK 554 Cxr: NAD Pelvix xray: Acute comminuted and displaced right intertrochanteric fracture.  ct pelvis:1. Acute comminuted right femoral intratrochanteric fracture.  CTH/Cervical spine: NAD Inr 1.1 Lactic 3.4 Sinus tachycardia, PAC , QT 505 Tx Fentanyl 36mg, 1LR, tylenol , Ketorolac,fentanyl    Review of Systems: As per HPI otherwise 10 point review of systems negative.   Past Medical History:  Diagnosis Date  . Allergy   . [place]   . History of chicken pox   . Osteoarthritis    Neck,Back,Knees,Hips,Ankles  . Personal history of chemotherapy   . Personal history of radiation therapy     Past Surgical History:  Procedure Laterality Date  . BREAST LUMPECTOMY Right 2017  . DILATION AND CURETTAGE OF UTERUS    . 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 PORTACATH PLACEMENT Right 12/11/2015   Procedure: INSERTION  PORT-A-CATH WITH ULTRASOUND;  Surgeon: MRolm Bookbinder MD;  Location: [facility]  Service: General;  Laterality: Right;  . RADIOACTIVE SEED GUIDED PARTIAL MASTECTOMY/AXILLARY SENTINEL NODE BIOPSY/AXILLARY NODE DISSECTION Right 05/02/2016   Procedure: RIGHT BREAST SEED GUIDED LUMPECTOMY AND RIGHT SEED TARGETED AXILLARY DISSECTION,AND SENTINEL NODE BIOPSY;  Surgeon: MRolm Bookbinder MD;  Location[facility]  Service: General;  Laterality: Right;  RIGHT BREAST SEED GUIDED LUMPECTOMY AND RIGHT SEED TARGETED AXILLARY DISSECTION,AND SENTINEL NODE BIOPSY  . WISDOM TOOTH EXTRACTION       reports that she has never smoked. She has never used smokeless tobacco. She reports that she does not currently use alcohol. She reports that she does not currently use drugs.  Allergies  Allergen Reactions  . Codeine Nausea And Vomiting    Family History  Problem Relation Age of Onset  . Heart disease Father   . Diabetes Other        GM  . Dementia Other        M  . Transient ischemic attack Other        F  . Charcot-Marie-Tooth disease Brother   . Colon cancer Neg Hx   . Breast cancer Neg Hx     Prior to Admission medications   Medication Sig Start Date End Date Taking? Authorizing Provider  Cholecalciferol (D-3-5) 125 MCG (  5000 UT) capsule Take 1,000 Units by mouth once a week. 12/29/17   [provider]  clobetasol ointment (TEMOVATE) 1.61 % Apply 1 application topically 3 (three) times a week. Apply a thin layer on the vulva and perianal area HS x 2 weeks, then continue long term 3 times a week. 09/05/21   Princess Bruins, MD  cyclobenzaprine (FLEXERIL) 10 MG tablet Take 10 mg by mouth 3 (three) times daily as needed for muscle spasms.    [provider]  doxycycline (VIBRA-TABS) 100 MG tablet Take 100 mg by mouth 2 (two) times daily. 09/16/22   [provider]  HYDROcodone-acetaminophen (NORCO/VICODIN) 5-325 MG tablet Take 1 tablet by  mouth every 6 (six) hours as needed.    [provider]  ibuprofen (ADVIL,MOTRIN) 200 MG tablet Take 200 mg by mouth every 6 (six) hours as needed for fever or mild pain. Reported on 03/22/2016    [provider]  Multiple Vitamin (MULTIVITAMIN) tablet Take 1 tablet by mouth daily.    [provider]    Physical Exam: Vitals:   11/25/22 1915 11/25/22 1930 11/25/22 1945 11/25/22 2000  BP: (!) 147/124 (!) 145/67  (!) 139/49  Pulse: 100 (!) 108 (!) 107 (!) 128  Resp: '17 18 20 15  '$ Temp:      TempSrc:      SpO2: 97% 99% 97% 98%  Weight:      Height:        Constitutional: NAD, calm, comfortable Vitals:   11/25/22 1915 11/25/22 1930 11/25/22 1945 11/25/22 2000  BP: (!) 147/124 (!) 145/67  (!) 139/49  Pulse: 100 (!) 108 (!) 107 (!) 128  Resp: '17 18 20 15  '$ Temp:      TempSrc:      SpO2: 97% 99% 97% 98%  Weight:      Height:       Eyes: PERRL, lids and conjunctivae normal ENMT: Mucous membranes are moist. Posterior pharynx clear of any exudate or lesions.Normal dentition.  Neck: normal, supple, no masses, no thyromegaly Respiratory: clear to auscultation bilaterally, no wheezing, no crackles. Normal respiratory effort. No accessory muscle use.  Cardiovascular: Regular rate and rhythm, no murmurs / rubs / gallops. No extremity edema. 2+ pedal pulses. No carotid bruits.  Abdomen: no tenderness, no masses palpated. No hepatosplenomegaly. Bowel sounds positive.  Musculoskeletal: no clubbing / cyanosis. No joint deformity upper and lower extremities. Good ROM, no contractures. Normal muscle tone.  Skin: no rashes, lesions, ulcers. No induration Neurologic: CN 2-12 grossly intact. Sensation intact, DTR normal. Strength 5/5 in all 4.  Psychiatric: Normal judgment and insight. Alert and oriented x 3. Normal mood.    Labs on Admission: I have personally reviewed following labs and imaging studies  CBC: Recent Labs  Lab 11/25/22 1749 11/25/22 1751  WBC  --   23.2*  HGB 16.3* 15.5*  HCT 48.0* 49.7*  MCV  --  86.0  PLT  --  096   Basic Metabolic Panel: Recent Labs  Lab 11/25/22 1749 11/25/22 1751  NA 139 138  K 3.7 3.7  CL 103 102  CO2  --  21*  GLUCOSE 88 87  BUN 24* 21  CREATININE 0.70 0.78  CALCIUM  --  9.4   GFR: Estimated Creatinine Clearance: 76.9 mL/min (by C-G formula based on SCr of 0.78 mg/dL). Liver Function Tests: Recent Labs  Lab 11/25/22 1751  AST 39  ALT 22  ALKPHOS 54  BILITOT 2.4*  PROT 7.2  ALBUMIN 4.1  No results for input(s): "LIPASE", "AMYLASE" in the last 168 hours. No results for input(s): "AMMONIA" in the last 168 hours. Coagulation Profile: Recent Labs  Lab 11/25/22 1821  INR 1.1   Cardiac Enzymes: Recent Labs  Lab 11/25/22 1751  CKTOTAL 554*   BNP (last 3 results) No results for input(s): "PROBNP" in the last 8760 hours. HbA1C: No results for input(s): "HGBA1C" in the last 72 hours. CBG: No results for input(s): "GLUCAP" in the last 168 hours. Lipid Profile: No results for input(s): "CHOL", "HDL", "LDLCALC", "TRIG", "CHOLHDL", "LDLDIRECT" in the last 72 hours. Thyroid Function Tests: No results for input(s): "TSH", "T4TOTAL", "FREET4", "T3FREE", "THYROIDAB" in the last 72 hours. Anemia Panel: No results for input(s): "VITAMINB12", "FOLATE", "FERRITIN", "TIBC", "IRON", "RETICCTPCT" in the last 72 hours. Urine analysis:    Component Value Date/Time   COLORURINE YELLOW 09/23/2022 1510   APPEARANCEUR SLIGHTLY CLOUDY (A) 09/23/2022 1510   LABSPEC 1.025 09/23/2022 1510   PHURINE 5.0 09/23/2022 1510   GLUCOSEU NEGATIVE 09/23/2022 1510   HGBUR NEGATIVE 09/23/2022 1510   BILIRUBINUR neg 12/29/2015 1014   KETONESUR NEGATIVE 09/23/2022 1510   PROTEINUR NEGATIVE 09/23/2022 1510   UROBILINOGEN negative 12/29/2015 1014   NITRITE neg 12/29/2015 1014   LEUKOCYTESUR Negative 12/29/2015 1014    Radiological Exams on Admission: CT HEAD WO CONTRAST  Result Date: 11/25/2022 CLINICAL DATA:   Head trauma, moderate-severe; Polytrauma, blunt EXAM: CT HEAD WITHOUT CONTRAST CT CERVICAL SPINE WITHOUT CONTRAST TECHNIQUE: Multidetector CT imaging of the head and cervical spine was performed following the standard protocol without intravenous contrast. Multiplanar CT image reconstructions of the cervical spine were also generated. RADIATION DOSE REDUCTION: This exam was performed according to the departmental dose-optimization program which includes automated exposure control, adjustment of the mA and/or kV according to patient size and/or use of iterative reconstruction technique. COMPARISON:  None Available. FINDINGS: CT HEAD FINDINGS Brain: No evidence of large-territorial acute infarction. No parenchymal hemorrhage. No mass lesion. No extra-axial collection. Empty sella. No mass effect or midline shift. No hydrocephalus. Basilar cisterns are patent. Vascular: No hyperdense vessel. Skull: No acute fracture or focal lesion. Sinuses/Orbits: Right maxillary sinus polypoid like mucosal thickening. Otherwise paranasal sinuses and mastoid air cells are clear. The orbits are unremarkable. Other: None. CT CERVICAL SPINE FINDINGS Alignment: Normal. Skull base and vertebrae: Multilevel moderate degenerative changes of the spine. No associated severe osseous neural foraminal or central canal stenosis. No acute fracture. No aggressive appearing focal osseous lesion or focal pathologic process. Soft tissues and spinal canal: No prevertebral fluid or swelling. No visible canal hematoma. Upper chest: Unremarkable. Other: None. IMPRESSION: 1. No acute intracranial abnormality. 2. No acute displaced fracture or traumatic listhesis of the cervical spine. 3. Empty sella. Findings is often a normal anatomic variant but can be associated with idiopathic intracranial hypertension (pseudotumor cerebri). Electronically Signed   By: Iven Finn M.D.   On: 11/25/2022 19:21   CT CERVICAL SPINE WO CONTRAST  Result Date:  11/25/2022 CLINICAL DATA:  Head trauma, moderate-severe; Polytrauma, blunt EXAM: CT HEAD WITHOUT CONTRAST CT CERVICAL SPINE WITHOUT CONTRAST TECHNIQUE: Multidetector CT imaging of the head and cervical spine was performed following the standard protocol without intravenous contrast. Multiplanar CT image reconstructions of the cervical spine were also generated. RADIATION DOSE REDUCTION: This exam was performed according to the departmental dose-optimization program which includes automated exposure control, adjustment of the mA and/or kV according to patient size and/or use of iterative reconstruction technique. COMPARISON:  None Available. FINDINGS: CT HEAD FINDINGS Brain:  No evidence of large-territorial acute infarction. No parenchymal hemorrhage. No mass lesion. No extra-axial collection. Empty sella. No mass effect or midline shift. No hydrocephalus. Basilar cisterns are patent. Vascular: No hyperdense vessel. Skull: No acute fracture or focal lesion. Sinuses/Orbits: Right maxillary sinus polypoid like mucosal thickening. Otherwise paranasal sinuses and mastoid air cells are clear. The orbits are unremarkable. Other: None. CT CERVICAL SPINE FINDINGS Alignment: Normal. Skull base and vertebrae: Multilevel moderate degenerative changes of the spine. No associated severe osseous neural foraminal or central canal stenosis. No acute fracture. No aggressive appearing focal osseous lesion or focal pathologic process. Soft tissues and spinal canal: No prevertebral fluid or swelling. No visible canal hematoma. Upper chest: Unremarkable. Other: None. IMPRESSION: 1. No acute intracranial abnormality. 2. No acute displaced fracture or traumatic listhesis of the cervical spine. 3. Empty sella. Findings is often a normal anatomic variant but can be associated with idiopathic intracranial hypertension (pseudotumor cerebri). Electronically Signed   By: Iven Finn M.D.   On: 11/25/2022 19:21   CT PELVIS WO  CONTRAST  Result Date: 11/25/2022 CLINICAL DATA:  Pelvic fracture.  Fall. EXAM: CT PELVIS WITHOUT CONTRAST TECHNIQUE: Multidetector CT imaging of the pelvis was performed following the standard protocol without intravenous contrast. RADIATION DOSE REDUCTION: This exam was performed according to the departmental dose-optimization program which includes automated exposure control, adjustment of the mA and/or kV according to patient size and/or use of iterative reconstruction technique. COMPARISON:  Pelvis x-ray same day. FINDINGS: Urinary Tract:  No abnormality visualized. Bowel: No acute abnormality. There is sigmoid colon diverticulosis. Vascular/Lymphatic: There are atherosclerotic calcifications of the aorta. No enlarged lymph nodes are seen. Reproductive:  Ovaries and uterus are within normal limits. Other: No pelvic free fluid. Small fat containing umbilical hernia. Musculoskeletal: There is an acute comminuted right femoral intratrochanteric fracture. There is 2 cm of medial displacement of the distal fracture fragment. There is no dislocation. Joint spaces are well maintained. Degenerative changes affect the sacroiliac joints and lower lumbar facet joints. IMPRESSION: 1. Acute comminuted right femoral intratrochanteric fracture. Aortic Atherosclerosis (ICD10-I70.0). Electronically Signed   By: Ronney Asters M.D.   On: 11/25/2022 18:52   DG FEMUR PORT 1V LEFT  Result Date: 11/25/2022 CLINICAL DATA:  Fall EXAM: LEFT FEMUR PORTABLE 1 VIEW COMPARISON:  None Available. FINDINGS: Femoral head projects in joint. No fracture or malalignment. Moderate degenerative changes at the knee IMPRESSION: No acute osseous abnormality Electronically Signed   By: Donavan Foil M.D.   On: 11/25/2022 18:38   DG FEMUR PORT, 1V RIGHT  Result Date: 11/25/2022 CLINICAL DATA:  Fall EXAM: RIGHT FEMUR PORTABLE 1 VIEW COMPARISON:  None Available. FINDINGS: Acute comminuted and displaced right intertrochanteric fracture. Mid to distal  femur appears intact. No femoral head dislocation IMPRESSION: Acute comminuted and displaced right intertrochanteric fracture. Electronically Signed   By: Donavan Foil M.D.   On: 11/25/2022 18:38   DG Pelvis Portable  Result Date: 11/25/2022 CLINICAL DATA:  Trauma EXAM: PORTABLE PELVIS 1-2 VIEWS COMPARISON:  None Available. FINDINGS: Limited by habitus. SI joints are non widened. Pubic symphysis and rami appear intact. Acute comminuted and displaced right intertrochanteric fracture. No femoral head dislocation IMPRESSION: Acute comminuted and displaced right intertrochanteric fracture. Electronically Signed   By: Donavan Foil M.D.   On: 11/25/2022 18:37   DG Chest Port 1 View  Result Date: 11/25/2022 CLINICAL DATA:  Trauma EXAM: PORTABLE CHEST 1 VIEW COMPARISON:  Chest x-ray 03/19/2016 FINDINGS: Cardiomediastinal silhouette is within normal limits for patient positioning.  Is no focal lung infiltrate, pleural effusion or pneumothorax on this supine view. Surgical clips are seen in the right axilla. No acute fractures are identified. IMPRESSION: 1. No acute cardiopulmonary process. 2. No acute fractures are identified. Electronically Signed   By: Ronney Asters M.D.   On: 11/25/2022 18:37    EKG: Independently reviewed. ***  Assessment/Plan Principal Problem:   Hip fracture (HCC)   ***  DVT prophylaxis: *** (Lovenox/Heparin/SCD's/anticoagulated/None (if comfort care) Code Status: *** (Full/Partial (specify details) Family Communication: *** (Specify name, relationship. Do not write "discussed with patient". Specify tel # if discussed over the phone) Disposition Plan: *** (specify when and where you expect patient to be discharged) Consults called: *** (with names) Admission status: *** (inpatient / obs / tele / medical floor / SDU)   Clance Boll MD [facility] Pager [phone]  If 7PM-7AM, please contact night-coverage [email or web address] Password [facility]  11/25/2022, 8:35 PM

## 2022-11-25 NOTE — ED Notes (Signed)
Patient transported to CT, with Primary & TRN.

## 2022-11-25 NOTE — Progress Notes (Signed)
Orthopedic Tech Progress Note Patient Details:  Terri Mayer Aug 17, 1951 [id number]  Patient ID: Terri Mayer, female   DOB: [birth year], 72 y.o.   MRN: [id number] Patient does not meet criteria for OHF. No one over 70 may have an ohf. Karolee Stamps 11/25/2022, 11:40 PM

## 2022-11-25 NOTE — ED Provider Notes (Signed)
[facility] EMERGENCY DEPARTMENT Provider Note   CSN: [id number] Arrival date & time: 11/25/22  1719     History  Chief Complaint  Patient presents with   Fall   Level 2    Terri Mayer is a 72 y.o. female.  Patient is a 72 year old female with a past medical history of previous right breast cancer now in remission presenting to the emergency department after a fall.  Patient states that she was putting Christmas decorations away in a shed yesterday evening when her foot got caught causing her to fall.  She states she is unsure if she hit her head but denied loss of consciousness.  She states that she immediately had significant pain in her right more than her left hip and was unable to get herself up.  She states that she did not have her phone with her and lives alone at home so was unable to notify anyone that she fell and was not found by a neighbor until this morning.  She states that she was outside for the last 22 hours.  Patient denies any numbness or weakness.  She denies any chest pain, shortness of breath or lightheadedness prior to the fall.  The history is provided by the patient.  Fall       Home Medications Prior to Admission medications   Medication Sig Start Date End Date Taking? Authorizing Provider  Cholecalciferol (D-3-5) 125 MCG (5000 UT) capsule Take 1,000 Units by mouth once a week. 12/29/17   [provider]  clobetasol ointment (TEMOVATE) 5.40 % Apply 1 application topically 3 (three) times a week. Apply a thin layer on the vulva and perianal area HS x 2 weeks, then continue long term 3 times a week. 09/05/21   Princess Bruins, MD  cyclobenzaprine (FLEXERIL) 10 MG tablet Take 10 mg by mouth 3 (three) times daily as needed for muscle spasms.    [provider]  doxycycline (VIBRA-TABS) 100 MG tablet Take 100 mg by mouth 2 (two) times daily. 09/16/22   [provider]  HYDROcodone-acetaminophen  (NORCO/VICODIN) 5-325 MG tablet Take 1 tablet by mouth every 6 (six) hours as needed.    [provider]  ibuprofen (ADVIL,MOTRIN) 200 MG tablet Take 200 mg by mouth every 6 (six) hours as needed for fever or mild pain. Reported on 03/22/2016    [provider]  Multiple Vitamin (MULTIVITAMIN) tablet Take 1 tablet by mouth daily.    [provider]      Allergies    Codeine    Review of Systems   Review of Systems  Physical Exam Updated Vital Signs BP (!) 139/49   Pulse (!) 128   Temp 98.9 F (37.2 C) (Oral)   Resp 15   Ht '5\' 5"'$  (1.651 m)   Wt 106.1 kg   SpO2 98%   BMI 38.94 kg/m  Physical Exam Vitals and nursing note reviewed.  Constitutional:      General: She is not in acute distress.    Appearance: Normal appearance. She is obese.  HENT:     Head: Normocephalic and atraumatic.     Nose: Nose normal.     Mouth/Throat:     Mouth: Mucous membranes are moist.     Pharynx: Oropharynx is clear.  Eyes:     Extraocular Movements: Extraocular movements intact.     Conjunctiva/sclera: Conjunctivae normal.     Pupils: Pupils are equal, round, and reactive to light.  Neck:  Comments: No midline neck tenderness Cardiovascular:     Rate and Rhythm: Regular rhythm. Tachycardia present.     Pulses: Normal pulses.     Heart sounds: Normal heart sounds.  Pulmonary:     Effort: Pulmonary effort is normal.     Breath sounds: Normal breath sounds.  Abdominal:     General: Abdomen is flat.     Palpations: Abdomen is soft.     Tenderness: There is no abdominal tenderness.  Musculoskeletal:     Cervical back: Normal range of motion and neck supple.     Comments: Tenderness to palpation of R hip with significant pain with internal/external rotation of hip. R leg shortened and internally rotated Mild tenderness to palpation of L hip, no obvious deformity No midline back tenderness No tenderness to palpation of bilateral UE Pelvis stable, non-tender   Skin:    General: Skin is dry.     Comments: Skin cool at distal extremities, core warm  Neurological:     General: No focal deficit present.     Mental Status: She is alert and oriented to person, place, and time.     Cranial Nerves: No cranial nerve deficit.     Sensory: No sensory deficit.     Motor: No weakness.  Psychiatric:        Mood and Affect: Mood normal.        Behavior: Behavior normal.     ED Results / Procedures / Treatments   Labs (all labs ordered are listed, but only abnormal results are displayed) Labs Reviewed  COMPREHENSIVE METABOLIC PANEL - Abnormal; Notable for the following components:      Result Value   CO2 21 (*)    Total Bilirubin 2.4 (*)    All other components within normal limits  CBC - Abnormal; Notable for the following components:   WBC 23.2 (*)    RBC 5.78 (*)    Hemoglobin 15.5 (*)    HCT 49.7 (*)    All other components within normal limits  LACTIC ACID, PLASMA - Abnormal; Notable for the following components:   Lactic Acid, Venous 3.4 (*)    All other components within normal limits  CK - Abnormal; Notable for the following components:   Total CK 554 (*)    All other components within normal limits  I-STAT CHEM 8, ED - Abnormal; Notable for the following components:   BUN 24 (*)    Calcium, Ion 1.10 (*)    Hemoglobin 16.3 (*)    HCT 48.0 (*)    All other components within normal limits  ETHANOL  PROTIME-INR  URINALYSIS, ROUTINE W REFLEX MICROSCOPIC  LACTIC ACID, PLASMA  URINALYSIS, ROUTINE W REFLEX MICROSCOPIC  PROCALCITONIN  SAMPLE TO BLOOD BANK    EKG EKG Interpretation  Date/Time:  Monday November 25 2022 18:02:44 EST Ventricular Rate:  116 PR Interval:  188 QRS Duration: 71 QT Interval:  363 QTC Calculation: 505 R Axis:   35 Text Interpretation: Sinus tachycardia Atrial premature complex Low voltage, precordial leads Borderline T abnormalities, anterior leads Prolonged QT interval Duplicate EKG Confirmed by  Leanord Asal ([id number]) on 11/25/2022 6:30:33 PM  Radiology CT HEAD WO CONTRAST  Result Date: 11/25/2022 CLINICAL DATA:  Head trauma, moderate-severe; Polytrauma, blunt EXAM: CT HEAD WITHOUT CONTRAST CT CERVICAL SPINE WITHOUT CONTRAST TECHNIQUE: Multidetector CT imaging of the head and cervical spine was performed following the standard protocol without intravenous contrast. Multiplanar CT image reconstructions of the cervical spine were also generated. RADIATION  DOSE REDUCTION: This exam was performed according to the departmental dose-optimization program which includes automated exposure control, adjustment of the mA and/or kV according to patient size and/or use of iterative reconstruction technique. COMPARISON:  None Available. FINDINGS: CT HEAD FINDINGS Brain: No evidence of large-territorial acute infarction. No parenchymal hemorrhage. No mass lesion. No extra-axial collection. Empty sella. No mass effect or midline shift. No hydrocephalus. Basilar cisterns are patent. Vascular: No hyperdense vessel. Skull: No acute fracture or focal lesion. Sinuses/Orbits: Right maxillary sinus polypoid like mucosal thickening. Otherwise paranasal sinuses and mastoid air cells are clear. The orbits are unremarkable. Other: None. CT CERVICAL SPINE FINDINGS Alignment: Normal. Skull base and vertebrae: Multilevel moderate degenerative changes of the spine. No associated severe osseous neural foraminal or central canal stenosis. No acute fracture. No aggressive appearing focal osseous lesion or focal pathologic process. Soft tissues and spinal canal: No prevertebral fluid or swelling. No visible canal hematoma. Upper chest: Unremarkable. Other: None. IMPRESSION: 1. No acute intracranial abnormality. 2. No acute displaced fracture or traumatic listhesis of the cervical spine. 3. Empty sella. Findings is often a normal anatomic variant but can be associated with idiopathic intracranial hypertension (pseudotumor cerebri).  Electronically Signed   By: Iven Finn M.D.   On: 11/25/2022 19:21   CT CERVICAL SPINE WO CONTRAST  Result Date: 11/25/2022 CLINICAL DATA:  Head trauma, moderate-severe; Polytrauma, blunt EXAM: CT HEAD WITHOUT CONTRAST CT CERVICAL SPINE WITHOUT CONTRAST TECHNIQUE: Multidetector CT imaging of the head and cervical spine was performed following the standard protocol without intravenous contrast. Multiplanar CT image reconstructions of the cervical spine were also generated. RADIATION DOSE REDUCTION: This exam was performed according to the departmental dose-optimization program which includes automated exposure control, adjustment of the mA and/or kV according to patient size and/or use of iterative reconstruction technique. COMPARISON:  None Available. FINDINGS: CT HEAD FINDINGS Brain: No evidence of large-territorial acute infarction. No parenchymal hemorrhage. No mass lesion. No extra-axial collection. Empty sella. No mass effect or midline shift. No hydrocephalus. Basilar cisterns are patent. Vascular: No hyperdense vessel. Skull: No acute fracture or focal lesion. Sinuses/Orbits: Right maxillary sinus polypoid like mucosal thickening. Otherwise paranasal sinuses and mastoid air cells are clear. The orbits are unremarkable. Other: None. CT CERVICAL SPINE FINDINGS Alignment: Normal. Skull base and vertebrae: Multilevel moderate degenerative changes of the spine. No associated severe osseous neural foraminal or central canal stenosis. No acute fracture. No aggressive appearing focal osseous lesion or focal pathologic process. Soft tissues and spinal canal: No prevertebral fluid or swelling. No visible canal hematoma. Upper chest: Unremarkable. Other: None. IMPRESSION: 1. No acute intracranial abnormality. 2. No acute displaced fracture or traumatic listhesis of the cervical spine. 3. Empty sella. Findings is often a normal anatomic variant but can be associated with idiopathic intracranial hypertension  (pseudotumor cerebri). Electronically Signed   By: Iven Finn M.D.   On: 11/25/2022 19:21   CT PELVIS WO CONTRAST  Result Date: 11/25/2022 CLINICAL DATA:  Pelvic fracture.  Fall. EXAM: CT PELVIS WITHOUT CONTRAST TECHNIQUE: Multidetector CT imaging of the pelvis was performed following the standard protocol without intravenous contrast. RADIATION DOSE REDUCTION: This exam was performed according to the departmental dose-optimization program which includes automated exposure control, adjustment of the mA and/or kV according to patient size and/or use of iterative reconstruction technique. COMPARISON:  Pelvis x-ray same day. FINDINGS: Urinary Tract:  No abnormality visualized. Bowel: No acute abnormality. There is sigmoid colon diverticulosis. Vascular/Lymphatic: There are atherosclerotic calcifications of the aorta. No enlarged lymph nodes  are seen. Reproductive:  Ovaries and uterus are within normal limits. Other: No pelvic free fluid. Small fat containing umbilical hernia. Musculoskeletal: There is an acute comminuted right femoral intratrochanteric fracture. There is 2 cm of medial displacement of the distal fracture fragment. There is no dislocation. Joint spaces are well maintained. Degenerative changes affect the sacroiliac joints and lower lumbar facet joints. IMPRESSION: 1. Acute comminuted right femoral intratrochanteric fracture. Aortic Atherosclerosis (ICD10-I70.0). Electronically Signed   By: Ronney Asters M.D.   On: 11/25/2022 18:52   DG FEMUR PORT 1V LEFT  Result Date: 11/25/2022 CLINICAL DATA:  Fall EXAM: LEFT FEMUR PORTABLE 1 VIEW COMPARISON:  None Available. FINDINGS: Femoral head projects in joint. No fracture or malalignment. Moderate degenerative changes at the knee IMPRESSION: No acute osseous abnormality Electronically Signed   By: Donavan Foil M.D.   On: 11/25/2022 18:38   DG FEMUR PORT, 1V RIGHT  Result Date: 11/25/2022 CLINICAL DATA:  Fall EXAM: RIGHT FEMUR PORTABLE 1 VIEW  COMPARISON:  None Available. FINDINGS: Acute comminuted and displaced right intertrochanteric fracture. Mid to distal femur appears intact. No femoral head dislocation IMPRESSION: Acute comminuted and displaced right intertrochanteric fracture. Electronically Signed   By: Donavan Foil M.D.   On: 11/25/2022 18:38   DG Pelvis Portable  Result Date: 11/25/2022 CLINICAL DATA:  Trauma EXAM: PORTABLE PELVIS 1-2 VIEWS COMPARISON:  None Available. FINDINGS: Limited by habitus. SI joints are non widened. Pubic symphysis and rami appear intact. Acute comminuted and displaced right intertrochanteric fracture. No femoral head dislocation IMPRESSION: Acute comminuted and displaced right intertrochanteric fracture. Electronically Signed   By: Donavan Foil M.D.   On: 11/25/2022 18:37   DG Chest Port 1 View  Result Date: 11/25/2022 CLINICAL DATA:  Trauma EXAM: PORTABLE CHEST 1 VIEW COMPARISON:  Chest x-ray 03/19/2016 FINDINGS: Cardiomediastinal silhouette is within normal limits for patient positioning. Is no focal lung infiltrate, pleural effusion or pneumothorax on this supine view. Surgical clips are seen in the right axilla. No acute fractures are identified. IMPRESSION: 1. No acute cardiopulmonary process. 2. No acute fractures are identified. Electronically Signed   By: Ronney Asters M.D.   On: 11/25/2022 18:37    Procedures Procedures    Medications Ordered in ED Medications  fentaNYL (SUBLIMAZE) injection 50 mcg (has no administration in time range)  ketorolac (TORADOL) 15 MG/ML injection 15 mg (has no administration in time range)  acetaminophen (TYLENOL) tablet 650 mg (has no administration in time range)  lactated ringers bolus 1,000 mL (0 mLs Intravenous Stopped 11/25/22 1944)  fentaNYL (SUBLIMAZE) injection 50 mcg (50 mcg Intravenous Given 11/25/22 1749)    ED Course/ Medical Decision Making/ A&P Clinical Course as of 11/25/22 2042  Mon Nov 25, 2022  1841 XR with R intertrochanteric fracture. CT  pelvis will be performed and orthopedics will be consulted. [VK]  1920 I spoke with Dr. Erlinda Hong of orthopedics who recommended NPO after midnight for OR and admission to hospitalist. [VK]    Clinical Course User Index [VK] Kemper Durie, DO                           Medical Decision Making This patient presents to the ED with chief complaint(s) of fall, hip pain with pertinent past medical history of prior breast cancer, in remission  which further complicates the presenting complaint. The complaint involves an extensive differential diagnosis and also carries with it a high risk of complications and morbidity.  The differential diagnosis includes fracture, pelvis fracture, ICH, mass effect, cervical spine fracture, rhabdo, dehydration, electrolyte abnormality  Additional history obtained: Additional history obtained from EMS  Records reviewed Primary Care Documents  ED Course and Reassessment: Due to patient's fall with concern for hip fracture she was made a prehospital arrival level 2 trauma.  I was immediately present at bedside upon patient's arrival.  Primary survey was intact.  Secondary survey was significant for tenderness to palpation of right hip with shortening and internal rotation of the right lower extremity.  No other traumatic injuries were seen on secondary exam.  Due to patient's age and significant trauma, she will have a CT head and C-spine performed in addition to pelvis and bilateral femur x-rays.  She was given fentanyl for pain.  She will have labs including CK to evaluate for dehydration, rhabdo or electrolyte abnormality from her prolonged downtime.  Independent labs interpretation:  The following labs were independently interpreted: mild CK elevation, leukocytosis likely stress related with trauma, labs otherwise within normal range  Independent visualization of imaging: - I independently visualized the following imaging with scope of interpretation limited to  determining acute life threatening conditions related to emergency care: CTH/C-spine, CT pelvis, XR pelvis/femurs, which revealed R intertrochanteric hip fx  Consultation: - Consulted or discussed management/test interpretation w/ external professional: orthopedics, hospitalist  Consideration for admission or further workup: patient requires admission for further management of her hip fracture Social Determinants of health: N/A    Amount and/or Complexity of Data Reviewed Labs: ordered. Radiology: ordered. ECG/medicine tests: ordered.  Risk Prescription drug management. Decision regarding hospitalization.          Final Clinical Impression(s) / ED Diagnoses Final diagnoses:  Closed fracture of right hip, initial encounter [facility])  Fall, initial encounter    Rx / DC Orders ED Discharge Orders     None         Kemper Durie, DO 11/25/22 2042

## 2022-11-25 NOTE — ED Triage Notes (Signed)
Pt BIB [facility] form home where she fell last night at 1845 last night putting up Christmas decorations & laying in her storage building until found & brought here to ED. She has some shortening noticed to her Rt leg, A/Ox4, given 100 mcg Fent in a 20g Lt AC, VSS.

## 2022-11-26 ENCOUNTER — Inpatient Hospital Stay (HOSPITAL_COMMUNITY): Payer: PPO | Admitting: Anesthesiology

## 2022-11-26 ENCOUNTER — Other Ambulatory Visit: Payer: Self-pay

## 2022-11-26 ENCOUNTER — Inpatient Hospital Stay (HOSPITAL_COMMUNITY): Payer: PPO

## 2022-11-26 ENCOUNTER — Encounter (HOSPITAL_COMMUNITY)
Admission: EM | Disposition: A | Discharge status: Rehabilitation Facility | Payer: Self-pay | Source: Home / Self Care | Attending: Internal Medicine

## 2022-11-26 ENCOUNTER — Encounter (HOSPITAL_COMMUNITY): Payer: Self-pay | Admitting: Internal Medicine

## 2022-11-26 DIAGNOSIS — S7221XA Displaced subtrochanteric fracture of right femur, initial encounter for closed fracture: Secondary | ICD-10-CM

## 2022-11-26 DIAGNOSIS — M79609 Pain in unspecified limb: Secondary | ICD-10-CM

## 2022-11-26 DIAGNOSIS — S72141A Displaced intertrochanteric fracture of right femur, initial encounter for closed fracture: Secondary | ICD-10-CM | POA: Diagnosis not present

## 2022-11-26 HISTORY — PX: INTRAMEDULLARY (IM) NAIL INTERTROCHANTERIC: SHX5875

## 2022-11-26 LAB — BASIC METABOLIC PANEL
Anion gap: 9 (ref 5–15)
BUN: 24 mg/dL — ABNORMAL HIGH (ref 8–23)
CO2: 24 mmol/L (ref 22–32)
Calcium: 8.4 mg/dL — ABNORMAL LOW (ref 8.9–10.3)
Chloride: 104 mmol/L (ref 98–111)
Creatinine, Ser: 1.13 mg/dL — ABNORMAL HIGH (ref 0.44–1.00)
GFR, Estimated: 52 mL/min — ABNORMAL LOW (ref >60)
Glucose, Bld: 99 mg/dL (ref 70–99)
Potassium: 3.7 mmol/L (ref 3.5–5.1)
Sodium: 137 mmol/L (ref 135–145)

## 2022-11-26 LAB — CREATININE, SERUM
Creatinine, Ser: 0.97 mg/dL (ref 0.44–1.00)
GFR, Estimated: >60 mL/min (ref >60)

## 2022-11-26 LAB — CBC
HCT: 36.9 % (ref 36.0–46.0)
HCT: 36.6 % (ref 36.0–46.0)
Hemoglobin: 12.3 g/dL (ref 12.0–15.0)
Hemoglobin: 11.7 g/dL — ABNORMAL LOW (ref 12.0–15.0)
MCH: 27.7 pg (ref 26.0–34.0)
MCH: 27 pg (ref 26.0–34.0)
MCHC: 33.3 g/dL (ref 30.0–36.0)
MCHC: 32 g/dL (ref 30.0–36.0)
MCV: 83.1 fL (ref 80.0–100.0)
MCV: 84.5 fL (ref 80.0–100.0)
Platelets: 262 10*3/uL (ref 150–400)
Platelets: 215 10*3/uL (ref 150–400)
RBC: 4.44 MIL/uL (ref 3.87–5.11)
RBC: 4.33 MIL/uL (ref 3.87–5.11)
RDW: 14 % (ref 11.5–15.5)
RDW: 14.2 % (ref 11.5–15.5)
WBC: 14.2 10*3/uL — ABNORMAL HIGH (ref 4.0–10.5)
WBC: 14 10*3/uL — ABNORMAL HIGH (ref 4.0–10.5)
nRBC: 0 % (ref 0.0–0.2)
nRBC: 0 % (ref 0.0–0.2)

## 2022-11-26 LAB — HEMOGLOBIN AND HEMATOCRIT, BLOOD
HCT: 38.8 % (ref 36.0–46.0)
Hemoglobin: 13.1 g/dL (ref 12.0–15.0)

## 2022-11-26 LAB — LACTIC ACID, PLASMA: Lactic Acid, Venous: 1 mmol/L (ref 0.5–1.9)

## 2022-11-26 LAB — PROCALCITONIN: Procalcitonin: 0.11 ng/mL

## 2022-11-26 LAB — SURGICAL PCR SCREEN
MRSA, PCR: NEGATIVE
Staphylococcus aureus: NEGATIVE

## 2022-11-26 SURGERY — FIXATION, FRACTURE, INTERTROCHANTERIC, WITH INTRAMEDULLARY ROD
Anesthesia: General | Laterality: Right

## 2022-11-26 MED ORDER — FENTANYL CITRATE (PF) 100 MCG/2ML IJ SOLN: 25.0000 ug | INTRAMUSCULAR | Status: DC | PRN

## 2022-11-26 MED ORDER — ONDANSETRON HCL 4 MG/2ML IJ SOLN: 4.0000 mg | Freq: Once | INTRAMUSCULAR | Status: DC | PRN

## 2022-11-26 MED ORDER — LIDOCAINE 2% (20 MG/ML) 5 ML SYRINGE
INTRAMUSCULAR | Status: DC | PRN
  Administered 2022-11-26: 100 mg via INTRAVENOUS

## 2022-11-26 MED ORDER — ROPIVACAINE HCL 5 MG/ML IJ SOLN
INTRAMUSCULAR | Status: DC | PRN
  Administered 2022-11-26: 30 mL via PERINEURAL

## 2022-11-26 MED ORDER — LACTATED RINGERS IV SOLN: INTRAVENOUS | Status: DC

## 2022-11-26 MED ORDER — OXYCODONE HCL 5 MG/5ML PO SOLN: 5.0000 mg | Freq: Once | ORAL | Status: DC | PRN

## 2022-11-26 MED ORDER — 0.9 % SODIUM CHLORIDE (POUR BTL) OPTIME
TOPICAL | Status: DC | PRN
  Administered 2022-11-26: 1000 mL

## 2022-11-26 MED ORDER — FENTANYL CITRATE (PF) 250 MCG/5ML IJ SOLN
INTRAMUSCULAR | Status: AC
  Filled 2022-11-26: qty 5

## 2022-11-26 MED ORDER — MEPERIDINE HCL 25 MG/ML IJ SOLN: 6.2500 mg | INTRAMUSCULAR | Status: DC | PRN

## 2022-11-26 MED ORDER — LACTATED RINGERS IV SOLN: INTRAVENOUS | Status: DC | PRN

## 2022-11-26 MED ORDER — ENOXAPARIN SODIUM 60 MG/0.6ML IJ SOSY: 50.0000 mg | PREFILLED_SYRINGE | INTRAMUSCULAR | Status: DC

## 2022-11-26 MED ORDER — PHENYLEPHRINE 80 MCG/ML (10ML) SYRINGE FOR IV PUSH (FOR BLOOD PRESSURE SUPPORT)
PREFILLED_SYRINGE | INTRAVENOUS | Status: DC | PRN
  Administered 2022-11-26: 160 ug via INTRAVENOUS

## 2022-11-26 MED ORDER — ACETAMINOPHEN 325 MG PO TABS: 325.0000 mg | ORAL_TABLET | ORAL | Status: DC | PRN

## 2022-11-26 MED ORDER — ROCURONIUM BROMIDE 10 MG/ML (PF) SYRINGE
PREFILLED_SYRINGE | INTRAVENOUS | Status: DC | PRN
  Administered 2022-11-26: 20 mg via INTRAVENOUS
  Administered 2022-11-26: 70 mg via INTRAVENOUS

## 2022-11-26 MED ORDER — DEXAMETHASONE SODIUM PHOSPHATE 10 MG/ML IJ SOLN
INTRAMUSCULAR | Status: DC | PRN
  Administered 2022-11-26: 10 mg via INTRAVENOUS

## 2022-11-26 MED ORDER — POVIDONE-IODINE 10 % EX SWAB
2.0000 | Freq: Once | CUTANEOUS | Status: AC
  Administered 2022-11-26: 2 via TOPICAL

## 2022-11-26 MED ORDER — DOCUSATE SODIUM 100 MG PO CAPS: 100.0000 mg | ORAL_CAPSULE | Freq: Two times a day (BID) | ORAL | Status: DC

## 2022-11-26 MED ORDER — CEFAZOLIN SODIUM-DEXTROSE 2-4 GM/100ML-% IV SOLN
INTRAVENOUS | Status: AC
  Filled 2022-11-26: qty 100

## 2022-11-26 MED ORDER — ACETAMINOPHEN 160 MG/5ML PO SOLN: 325.0000 mg | ORAL | Status: DC | PRN

## 2022-11-26 MED ORDER — MIDAZOLAM HCL 2 MG/2ML IJ SOLN
INTRAMUSCULAR | Status: AC
  Filled 2022-11-26: qty 2

## 2022-11-26 MED ORDER — ONDANSETRON HCL 4 MG/2ML IJ SOLN
INTRAMUSCULAR | Status: DC | PRN
  Administered 2022-11-26: 4 mg via INTRAVENOUS

## 2022-11-26 MED ORDER — SUGAMMADEX SODIUM 200 MG/2ML IV SOLN
INTRAVENOUS | Status: DC | PRN
  Administered 2022-11-26 (×2): 200 mg via INTRAVENOUS

## 2022-11-26 MED ORDER — TRANEXAMIC ACID-NACL 1000-0.7 MG/100ML-% IV SOLN
1000.0000 mg | Freq: Once | INTRAVENOUS | Status: AC
  Administered 2022-11-26: 1000 mg via INTRAVENOUS
  Filled 2022-11-26: qty 100

## 2022-11-26 MED ORDER — ACETAMINOPHEN 500 MG PO TABS
1000.0000 mg | ORAL_TABLET | Freq: Three times a day (TID) | ORAL | Status: DC
  Administered 2022-11-26 – 2022-11-29 (×7): 1000 mg via ORAL
  Filled 2022-11-26 (×8): qty 2

## 2022-11-26 MED ORDER — MENTHOL 3 MG MT LOZG: 1.0000 | LOZENGE | OROMUCOSAL | Status: DC | PRN

## 2022-11-26 MED ORDER — PROPOFOL 10 MG/ML IV BOLUS
INTRAVENOUS | Status: DC | PRN
  Administered 2022-11-26: 140 mg via INTRAVENOUS

## 2022-11-26 MED ORDER — PHENYLEPHRINE HCL-NACL 20-0.9 MG/250ML-% IV SOLN
INTRAVENOUS | Status: DC | PRN
  Administered 2022-11-26: 25 ug/min via INTRAVENOUS

## 2022-11-26 MED ORDER — OXYCODONE HCL 5 MG PO TABS
5.0000 mg | ORAL_TABLET | ORAL | Status: DC | PRN
  Administered 2022-11-27 – 2022-11-29 (×6): 10 mg via ORAL
  Filled 2022-11-26 (×7): qty 2

## 2022-11-26 MED ORDER — OXYCODONE HCL 5 MG PO TABS: 5.0000 mg | ORAL_TABLET | Freq: Once | ORAL | Status: DC | PRN

## 2022-11-26 MED ORDER — ONDANSETRON HCL 4 MG PO TABS: 4.0000 mg | ORAL_TABLET | Freq: Four times a day (QID) | ORAL | Status: DC | PRN

## 2022-11-26 MED ORDER — CEFAZOLIN SODIUM-DEXTROSE 2-4 GM/100ML-% IV SOLN
2.0000 g | INTRAVENOUS | Status: AC
  Administered 2022-11-26: 2 g via INTRAVENOUS

## 2022-11-26 MED ORDER — CEFAZOLIN SODIUM-DEXTROSE 2-4 GM/100ML-% IV SOLN
2.0000 g | Freq: Four times a day (QID) | INTRAVENOUS | Status: AC
  Administered 2022-11-27 (×2): 2 g via INTRAVENOUS
  Filled 2022-11-26 (×2): qty 100

## 2022-11-26 MED ORDER — MIDAZOLAM HCL 2 MG/2ML IJ SOLN
2.0000 mg | Freq: Once | INTRAMUSCULAR | Status: AC
  Administered 2022-11-26: 2 mg via INTRAVENOUS

## 2022-11-26 MED ORDER — CHLORHEXIDINE GLUCONATE 0.12 % MT SOLN: 15.0000 mL | Freq: Once | OROMUCOSAL | Status: AC

## 2022-11-26 MED ORDER — METHOCARBAMOL 500 MG PO TABS
500.0000 mg | ORAL_TABLET | Freq: Four times a day (QID) | ORAL | Status: DC | PRN
  Administered 2022-11-27 – 2022-11-29 (×4): 500 mg via ORAL
  Filled 2022-11-26 (×5): qty 1

## 2022-11-26 MED ORDER — PHENOL 1.4 % MT LIQD: 1.0000 | OROMUCOSAL | Status: DC | PRN

## 2022-11-26 MED ORDER — VITAMIN C 500 MG PO TABS
1000.0000 mg | ORAL_TABLET | Freq: Every day | ORAL | Status: DC
  Administered 2022-11-27 – 2022-11-29 (×3): 1000 mg via ORAL
  Filled 2022-11-26 (×3): qty 2

## 2022-11-26 MED ORDER — ENOXAPARIN SODIUM 40 MG/0.4ML IJ SOSY
40.0000 mg | PREFILLED_SYRINGE | INTRAMUSCULAR | Status: DC
  Administered 2022-11-27: 40 mg via SUBCUTANEOUS
  Filled 2022-11-26: qty 0.4

## 2022-11-26 MED ORDER — CHLORHEXIDINE GLUCONATE 4 % EX LIQD: 60.0000 mL | Freq: Once | CUTANEOUS | Status: DC

## 2022-11-26 MED ORDER — FENTANYL CITRATE (PF) 100 MCG/2ML IJ SOLN
INTRAMUSCULAR | Status: AC
  Filled 2022-11-26: qty 2

## 2022-11-26 MED ORDER — HYDROMORPHONE HCL 1 MG/ML IJ SOLN
0.5000 mg | INTRAMUSCULAR | Status: DC | PRN
  Administered 2022-11-28: 1 mg via INTRAVENOUS
  Filled 2022-11-26: qty 1

## 2022-11-26 MED ORDER — ORAL CARE MOUTH RINSE: 15.0000 mL | Freq: Once | OROMUCOSAL | Status: AC

## 2022-11-26 MED ORDER — METOCLOPRAMIDE HCL 5 MG/ML IJ SOLN: 5.0000 mg | Freq: Three times a day (TID) | INTRAMUSCULAR | Status: DC | PRN

## 2022-11-26 MED ORDER — ACETAMINOPHEN 325 MG PO TABS
325.0000 mg | ORAL_TABLET | Freq: Four times a day (QID) | ORAL | Status: DC | PRN
  Administered 2022-11-28: 650 mg via ORAL
  Filled 2022-11-26 (×2): qty 2

## 2022-11-26 MED ORDER — METOCLOPRAMIDE HCL 5 MG PO TABS: 5.0000 mg | ORAL_TABLET | Freq: Three times a day (TID) | ORAL | Status: DC | PRN

## 2022-11-26 MED ORDER — FENTANYL CITRATE (PF) 250 MCG/5ML IJ SOLN
INTRAMUSCULAR | Status: DC | PRN
  Administered 2022-11-26 (×2): 50 ug via INTRAVENOUS
  Administered 2022-11-26: 100 ug via INTRAVENOUS
  Administered 2022-11-26: 50 ug via INTRAVENOUS

## 2022-11-26 MED ORDER — CHLORHEXIDINE GLUCONATE 0.12 % MT SOLN
OROMUCOSAL | Status: AC
  Administered 2022-11-26: 15 mL via OROMUCOSAL
  Filled 2022-11-26: qty 15

## 2022-11-26 MED ORDER — ONDANSETRON HCL 4 MG/2ML IJ SOLN: 4.0000 mg | Freq: Four times a day (QID) | INTRAMUSCULAR | Status: DC | PRN

## 2022-11-26 MED ORDER — FENTANYL CITRATE PF 50 MCG/ML IJ SOSY
50.0000 ug | PREFILLED_SYRINGE | Freq: Once | INTRAMUSCULAR | Status: AC
  Administered 2022-11-26: 50 ug via INTRAVENOUS

## 2022-11-26 MED ORDER — METHOCARBAMOL 1000 MG/10ML IJ SOLN: 500.0000 mg | Freq: Four times a day (QID) | INTRAVENOUS | Status: DC | PRN

## 2022-11-26 MED ORDER — VITAMIN D 25 MCG (1000 UNIT) PO TABS
2000.0000 [IU] | ORAL_TABLET | Freq: Two times a day (BID) | ORAL | Status: DC
  Administered 2022-11-26 – 2022-11-29 (×6): 2000 [IU] via ORAL
  Filled 2022-11-26 (×7): qty 2

## 2022-11-26 SURGICAL SUPPLY — 60 items
BAG COUNTER SPONGE SURGICOUNT (BAG) ×1 IMPLANT
BAG SPNG CNTER NS LX DISP (BAG) ×1
BIT DRILL CALIBRTD FREE HND4.3 (BIT) IMPLANT
BIT DRILL LAG SCREW (DRILL) IMPLANT
BNDG COHESIVE 6X5 TAN STRL LF (GAUZE/BANDAGES/DRESSINGS) IMPLANT
BRUSH SCRUB EZ PLAIN DRY (MISCELLANEOUS) ×2 IMPLANT
COVER PERINEAL POST (MISCELLANEOUS) ×1 IMPLANT
COVER SURGICAL LIGHT HANDLE (MISCELLANEOUS) ×2 IMPLANT
DRAPE C-ARMOR (DRAPES) ×1 IMPLANT
DRAPE HALF SHEET 40X57 (DRAPES) IMPLANT
DRAPE INCISE IOBAN 66X45 STRL (DRAPES) ×1 IMPLANT
DRAPE ORTHO SPLIT 77X108 STRL (DRAPES)
DRAPE SURG ORHT 6 SPLT 77X108 (DRAPES) IMPLANT
DRAPE U-SHAPE 47X51 STRL (DRAPES) ×1 IMPLANT
DRESSING MEPILEX FLEX 4X4 (GAUZE/BANDAGES/DRESSINGS) IMPLANT
DRILL CALIBRATED FREE HAND 4.3 (BIT) ×1
DRILL LAG SCREW (DRILL) ×1
DRSG EMULSION OIL 3X3 NADH (GAUZE/BANDAGES/DRESSINGS) ×1 IMPLANT
DRSG MEPILEX BORDER 4X4 (GAUZE/BANDAGES/DRESSINGS) ×1 IMPLANT
DRSG MEPILEX BORDER 4X8 (GAUZE/BANDAGES/DRESSINGS) ×1 IMPLANT
DRSG MEPILEX FLEX 4X4 (GAUZE/BANDAGES/DRESSINGS) ×2
DRSG MEPILEX POST OP 4X8 (GAUZE/BANDAGES/DRESSINGS) IMPLANT
ELECT REM PT RETURN 9FT ADLT (ELECTROSURGICAL) ×1
ELECTRODE REM PT RTRN 9FT ADLT (ELECTROSURGICAL) ×1 IMPLANT
GLOVE BIO SURGEON STRL SZ7.5 (GLOVE) ×1 IMPLANT
GLOVE BIO SURGEON STRL SZ8 (GLOVE) ×1 IMPLANT
GLOVE BIOGEL PI IND STRL 7.5 (GLOVE) ×1 IMPLANT
GLOVE BIOGEL PI IND STRL 8 (GLOVE) ×1 IMPLANT
GLOVE SURG ORTHO LTX SZ7.5 (GLOVE) ×2 IMPLANT
GOWN STRL REUS W/ TWL LRG LVL3 (GOWN DISPOSABLE) ×2 IMPLANT
GOWN STRL REUS W/ TWL XL LVL3 (GOWN DISPOSABLE) ×1 IMPLANT
GOWN STRL REUS W/TWL LRG LVL3 (GOWN DISPOSABLE) ×2
GOWN STRL REUS W/TWL XL LVL3 (GOWN DISPOSABLE) ×1
GUIDEPIN VERSANAIL DSP 3.2X444 (ORTHOPEDIC DISPOSABLE SUPPLIES) IMPLANT
GUIDEWIRE BALL NOSE 100CM (WIRE) IMPLANT
HFN RH 130 DEG 9MM X 380MM (Nail) IMPLANT
HIP FR NAIL LAG SCREW 10.5X110 (Orthopedic Implant) ×1 IMPLANT
KIT BASIN OR (CUSTOM PROCEDURE TRAY) ×1 IMPLANT
KIT TURNOVER KIT B (KITS) ×1 IMPLANT
MANIFOLD NEPTUNE II (INSTRUMENTS) ×1 IMPLANT
NS IRRIG 1000ML POUR BTL (IV SOLUTION) ×1 IMPLANT
PACK GENERAL/GYN (CUSTOM PROCEDURE TRAY) ×1 IMPLANT
PAD ARMBOARD 7.5X6 YLW CONV (MISCELLANEOUS) ×2 IMPLANT
SCREW ANTI ROTATION 85MM (Screw) IMPLANT
SCREW BONE CORTICAL 5.0X36 (Screw) IMPLANT
SCREW DRILL BIT ANIT ROTATION (BIT) IMPLANT
SCREW LAG HIP FR NAIL 10.5X110 (Orthopedic Implant) IMPLANT
STAPLER VISISTAT 35W (STAPLE) ×1 IMPLANT
STOCKINETTE IMPERVIOUS LG (DRAPES) IMPLANT
SUT ETHILON 2 0 FS 18 (SUTURE) ×1 IMPLANT
SUT ETHILON 2 0 PSLX (SUTURE) IMPLANT
SUT VIC AB 0 CT1 27 (SUTURE) ×1
SUT VIC AB 0 CT1 27XBRD ANBCTR (SUTURE) ×1 IMPLANT
SUT VIC AB 1 CT1 27 (SUTURE) ×1
SUT VIC AB 1 CT1 27XBRD ANBCTR (SUTURE) ×1 IMPLANT
SUT VIC AB 2-0 CT1 27 (SUTURE) ×1
SUT VIC AB 2-0 CT1 TAPERPNT 27 (SUTURE) ×1 IMPLANT
TOWEL GREEN STERILE (TOWEL DISPOSABLE) ×2 IMPLANT
TOWEL GREEN STERILE FF (TOWEL DISPOSABLE) ×1 IMPLANT
WATER STERILE IRR 1000ML POUR (IV SOLUTION) ×1 IMPLANT

## 2022-11-26 NOTE — Anesthesia Preprocedure Evaluation (Addendum)
Anesthesia Evaluation  Patient identified by MRN, date of birth, ID band Patient awake    Reviewed: Allergy & Precautions, H&P , NPO status , Patient's Chart, lab work & pertinent test results  History of Anesthesia Complications Negative for: history of anesthetic complications  Airway Mallampati: II  TM Distance: >3 FB Neck ROM: Full    Dental  (+) Caps, Dental Advisory Given   Pulmonary neg pulmonary ROS   Pulmonary exam normal        Cardiovascular Exercise Tolerance: Good Normal cardiovascular exam(-) dysrhythmias      Neuro/Psych negative neurological ROS  negative psych ROS   GI/Hepatic negative GI ROS, Neg liver ROS,,,  Endo/Other    Morbid obesity  Renal/GU negative Renal ROS  negative genitourinary   Musculoskeletal  (+) Arthritis , Osteoarthritis,    Abdominal   Peds  Hematology negative hematology ROS (+)   Anesthesia Other Findings   Reproductive/Obstetrics negative OB ROS                             Anesthesia Physical Anesthesia Plan  ASA: 2  Anesthesia Plan: General   Post-op Pain Management: Precedex, Minimal or no pain anticipated and Tylenol PO (pre-op)*   Induction: Intravenous  PONV Risk Score and Plan: 3 and Ondansetron, Dexamethasone and Treatment may vary due to age or medical condition  Airway Management Planned: Oral ETT  Additional Equipment: None  Intra-op Plan:   Post-operative Plan: Extubation in OR  Informed Consent: I have reviewed the patients History and Physical, chart, labs and discussed the procedure including the risks, benefits and alternatives for the proposed anesthesia with the patient or authorized representative who has indicated his/her understanding and acceptance.     Dental advisory given  Plan Discussed with: Anesthesiologist and CRNA  Anesthesia Plan Comments:         Anesthesia Quick Evaluation

## 2022-11-26 NOTE — Progress Notes (Signed)
Lower extremity venous bilateral study completed.   Please see CV Proc for preliminary results.   Nessa Ramaker, RDMS, RVT  

## 2022-11-26 NOTE — ED Notes (Signed)
ED TO INPATIENT HANDOFF REPORT  ED Nurse Name and Phone #:  Shiquan Mathieu [phone]  S Name/Age/Gender Terri Mayer 72 y.o. female Room/Bed: 011C/011C  Code Status   Code Status: Full Code  Home/SNF/Other Home Patient oriented to: situation Is this baseline? Yes   Triage Complete: Triage complete  Chief Complaint Hip fracture ([place]) [S72.009A] Displaced intertrochanteric fracture of right femur, initial encounter for closed fracture [facility]) [S72.141A]  Triage Note Pt BIB [facility] form home where she fell last night at [place] last night putting up Christmas decorations & laying in her storage building until found & brought here to ED. She has some shortening noticed to her Rt leg, A/Ox4, given 100 mcg Fent in a 20g Lt AC, VSS.   Allergies Allergies  Allergen Reactions   Codeine Nausea And Vomiting    dizziness    Level of Care/Admitting Diagnosis ED Disposition     ED Disposition  Admit   Condition  --   Comment  Hospital Area: [place] [[id number]]  Level of Care: Med-Surg [16]  May admit patient to [facility] or [facility] if equivalent level of care is available:: No  Covid Evaluation: Asymptomatic - no recent exposure (last 10 days) testing not required  Diagnosis: Displaced intertrochanteric fracture of right femur, initial encounter for closed fracture [facility]) [[id number]]  Admitting Physician: [place] ERIC J [[id number]]  Attending Physician: [place] ERIC J [[id number]]  Certification:: I certify this patient will need inpatient services for at least 2 midnights          B Medical/Surgery History Past Medical History:  Diagnosis Date   Allergy    Breast cancer ([place])    History of chicken pox    Osteoarthritis    Neck,Back,Knees,Hips,Ankles   Personal history of chemotherapy    Personal history of radiation therapy    Past Surgical History:  Procedure Laterality Date   BREAST LUMPECTOMY Right 2017   DILATION AND CURETTAGE OF UTERUS     EYE  SURGERY  at age 61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   PORTACATH PLACEMENT Right 12/11/2015   Procedure: INSERTION PORT-A-CATH WITH ULTRASOUND;  Surgeon: Rolm Bookbinder, MD;  Location: [place];  Service: General;  Laterality: Right;   RADIOACTIVE SEED GUIDED PARTIAL MASTECTOMY/AXILLARY SENTINEL NODE BIOPSY/AXILLARY NODE DISSECTION Right 05/02/2016   Procedure: RIGHT BREAST SEED GUIDED LUMPECTOMY AND RIGHT SEED TARGETED AXILLARY DISSECTION,AND SENTINEL NODE BIOPSY;  Surgeon: Rolm Bookbinder, MD;  Location: [place];  Service: General;  Laterality: Right;  RIGHT BREAST SEED GUIDED LUMPECTOMY AND RIGHT SEED TARGETED AXILLARY DISSECTION,AND SENTINEL NODE BIOPSY   WISDOM TOOTH EXTRACTION       A IV Location/Drains/Wounds Patient Lines/Drains/Airways Status     Active Line/Drains/Airways     Name Placement date Placement time Site Days   Implanted Port 12/11/15 Right Chest 12/11/15  1044  Chest  2542   Peripheral IV 11/25/22 20 G Left Antecubital 11/25/22  1739  Antecubital  1   Peripheral IV 11/25/22 20 G Anterior;Distal;Left Forearm 11/25/22  1803  Forearm  1   External Urinary Catheter 11/25/22  1846  --  1   Incision (Closed) 12/11/15 Chest Right 12/11/15  1052  -- 2542   Incision (Closed) 05/02/16 Breast Right 05/02/16  0828  -- 2399   Incision (Closed) 05/02/16 Breast Right 05/02/16  0828  -- 2399            Intake/Output Last 24 hours  Intake/Output Summary (Last 24 hours) at 11/26/2022  [place] filed at 11/26/2022 1105 Gross per 24 hour  Intake 952.72 ml  Output 300 ml  Net 652.72 ml    Labs/Imaging Results for orders placed or performed during the hospital encounter of 11/25/22 (from the past 48 hour(s))  Sample to Blood Bank     Status: None   Collection Time: 11/25/22  5:30 PM  Result Value Ref Range   Blood Bank Specimen SAMPLE AVAILABLE FOR TESTING    Sample Expiration      11/26/2022,2359 Performed at [facility], [place] [address] [place], [place] [address]   Type and screen [place]     Status: None   Collection Time: 11/25/22  5:30 PM  Result Value Ref Range   ABO/RH(D) B POS    Antibody Screen NEG    Sample Expiration      11/28/2022,2359 Performed at [facility], [place] [address], [place], [place] [address]   I-Stat Chem 8, ED     Status: Abnormal   Collection Time: 11/25/22  5:49 PM  Result Value Ref Range   Sodium 139 135 - 145 mmol/L   Potassium 3.7 3.5 - 5.1 mmol/L   Chloride 103 98 - 111 mmol/L   BUN 24 (H) 8 - 23 mg/dL   Creatinine, Ser 0.70 0.44 - 1.00 mg/dL   Glucose, Bld 88 70 - 99 mg/dL    Comment: Glucose reference range applies only to samples taken after fasting for at least 8 hours.   Calcium, Ion 1.10 (L) 1.15 - 1.40 mmol/L   TCO2 22 22 - 32 mmol/L   Hemoglobin 16.3 (H) 12.0 - 15.0 g/dL   HCT 48.0 (H) 36.0 - 46.0 %  Comprehensive metabolic panel     Status: Abnormal   Collection Time: 11/25/22  5:51 PM  Result Value Ref Range   Sodium 138 135 - 145 mmol/L   Potassium 3.7 3.5 - 5.1 mmol/L   Chloride 102 98 - 111 mmol/L   CO2 21 (L) 22 - 32 mmol/L   Glucose, Bld 87 70 - 99 mg/dL    Comment: Glucose reference range applies only to samples taken after fasting for at least 8 hours.   BUN 21 8 - 23 mg/dL   Creatinine, Ser 0.78 0.44 - 1.00 mg/dL   Calcium 9.4 8.9 - 10.3 mg/dL   Total Protein 7.2 6.5 - 8.1 g/dL   Albumin 4.1 3.5 - 5.0 g/dL   AST 39 15 - 41 U/L   ALT 22 0 - 44 U/L   Alkaline Phosphatase 54 38 - 126 U/L   Total Bilirubin 2.4 (H) 0.3 - 1.2 mg/dL   GFR, Estimated >60 >60 mL/min    Comment: (NOTE) Calculated using the CKD-EPI Creatinine Equation (2021)    Anion gap 15 5 - 15    Comment: Performed at [facility] [address], [place] [address]  CBC     Status: Abnormal   Collection Time: 11/25/22  5:51 PM  Result Value Ref Range   WBC 23.2 (H) 4.0 - 10.5 K/uL   RBC 5.78 (H) 3.87 - 5.11 MIL/uL   Hemoglobin 15.5 (H) 12.0  - 15.0 g/dL   HCT 49.7 (H) 36.0 - 46.0 %   MCV 86.0 80.0 - 100.0 fL   MCH 26.8 26.0 - 34.0 pg   MCHC 31.2 30.0 - 36.0 g/dL   RDW 13.7 11.5 - 15.5 %   Platelets 368 150 - 400 K/uL  nRBC 0.0 0.0 - 0.2 %    Comment: Performed at [facility], [place] [address] [place], [place] [address]  Ethanol     Status: None   Collection Time: 11/25/22  5:51 PM  Result Value Ref Range   Alcohol, Ethyl (B) <10 <10 mg/dL    Comment: (NOTE) Lowest detectable limit for serum alcohol is 10 mg/dL.  For medical purposes only. Performed at [facility], [place] [address], [place], [place] [address]   Lactic acid, plasma     Status: Abnormal   Collection Time: 11/25/22  5:51 PM  Result Value Ref Range   Lactic Acid, Venous 3.4 (HH) 0.5 - 1.9 mmol/L    Comment: CRITICAL RESULT CALLED TO, READ BACK BY AND VERIFIED WITH P. PULLIAM RN 11/25/22 '@1849'$  BY J. WHITE Performed at [facility] [address], [place], [place] [address]   CK     Status: Abnormal   Collection Time: 11/25/22  5:51 PM  Result Value Ref Range   Total CK 554 (H) 38 - 234 U/L    Comment: Performed at [facility], [place] [address], [place],  [address]  Procalcitonin - Baseline     Status: None   Collection Time: 11/25/22  5:51 PM  Result Value Ref Range   Procalcitonin <0.10 ng/mL    Comment:        Interpretation: PCT (Procalcitonin) <= 0.5 ng/mL: Systemic infection (sepsis) is not likely. Local bacterial infection is possible. (NOTE)       Sepsis PCT Algorithm           Lower Respiratory Tract                                      Infection PCT Algorithm    ----------------------------     ----------------------------         PCT < 0.25 ng/mL                PCT < 0.10 ng/mL          Strongly encourage             Strongly discourage   discontinuation of antibiotics    initiation of antibiotics    ----------------------------     -----------------------------       PCT 0.25 - 0.50 ng/mL            PCT  0.10 - 0.25 ng/mL               OR       >80% decrease in PCT            Discourage initiation of                                            antibiotics      Encourage discontinuation           of antibiotics    ----------------------------     -----------------------------         PCT >= 0.50 ng/mL              PCT 0.26 - 0.50 ng/mL               AND        <80% decrease  in PCT             Encourage initiation of                                             antibiotics       Encourage continuation           of antibiotics    ----------------------------     -----------------------------        PCT >= 0.50 ng/mL                  PCT > 0.50 ng/mL               AND         increase in PCT                  Strongly encourage                                      initiation of antibiotics    Strongly encourage escalation           of antibiotics                                     -----------------------------                                           PCT <= 0.25 ng/mL                                                 OR                                        > 80% decrease in PCT                                      Discontinue / Do not initiate                                             antibiotics  Performed at [facility], [address], [place], [place] [address]   Protime-INR     Status: None   Collection Time: 11/25/22  6:21 PM  Result Value Ref Range   Prothrombin Time 14.2 11.4 - 15.2 seconds   INR 1.1 0.8 - 1.2    Comment: (NOTE) INR goal varies based on device and disease states. Performed at [facility], [place] [address], [place], [place] [address]   Lactic acid, plasma     Status: Abnormal   Collection Time: 11/25/22  8:36 PM  Result Value Ref Range   Lactic Acid, Venous 2.4 (HH) 0.5 -  1.9 mmol/L    Comment: CRITICAL VALUE NOTED. VALUE IS CONSISTENT WITH PREVIOUSLY REPORTED/CALLED VALUE Performed at [facility], [place] [address], [place],  [place] [address]   VITAMIN D 25 Hydroxy (Vit-D Deficiency, Fractures)     Status: Abnormal   Collection Time: 11/25/22  9:52 PM  Result Value Ref Range   Vit D, 25-Hydroxy 18.39 (L) 30 - 100 ng/mL    Comment: (NOTE) Vitamin D deficiency has been defined by the [place] practice guideline as a level of serum 25-OH  vitamin D less than 20 ng/mL (1,2). The Endocrine Society went on to  further define vitamin D insufficiency as a level between 21 and 29  ng/mL (2).  1. IOM (Institute of Medicine). 2010. Dietary reference intakes for  calcium and D. [place]: The [employer]. 2. Holick MF, Binkley Anderson, Bischoff-Ferrari HA, et al. Evaluation,  treatment, and prevention of vitamin D deficiency: an Endocrine  Society clinical practice guideline, JCEM. 2011 Jul; 96(7): 1911-30.  Performed at [facility], [place] [address] [place], [place] [address]   Albumin     Status: None   Collection Time: 11/25/22  9:52 PM  Result Value Ref Range   Albumin 3.5 3.5 - 5.0 g/dL    Comment: Performed at [facility], [place] [address], [place], [place] [address]  ABO/Rh     Status: None   Collection Time: 11/25/22  9:52 PM  Result Value Ref Range   ABO/RH(D)      B POS Performed at [facility] [address] [place], [place] [address]   C-reactive protein     Status: Abnormal   Collection Time: 11/25/22  9:52 PM  Result Value Ref Range   CRP 8.0 (H) <1.0 mg/dL    Comment: Performed at [facility], [place] [address], [place], [place] [address]  Lactic acid, plasma     Status: None   Collection Time: 11/25/22 10:23 PM  Result Value Ref Range   Lactic Acid, Venous 1.4 0.5 - 1.9 mmol/L    Comment: Performed at [facility] [address], [place], [place] [address]  D-dimer, quantitative     Status: Abnormal   Collection Time: 11/25/22 10:23 PM  Result Value Ref Range   D-Dimer, Quant 1.62 (H) 0.00 - 0.50 ug/mL-FEU     Comment: (NOTE) At the manufacturer cut-off value of 0.5 g/mL FEU, this assay has a negative predictive value of 95-100%.This assay is intended for use in conjunction with a clinical pretest probability (PTP) assessment model to exclude pulmonary embolism (PE) and deep venous thrombosis (DVT) in outpatients suspected of PE or DVT. Results should be correlated with clinical presentation. Performed at [facility], [place] [address] [place], [place] [address]   Urinalysis, Routine w reflex microscopic Urine, In & Out Cath     Status: Abnormal   Collection Time: 11/25/22 10:45 PM  Result Value Ref Range   Color, Urine YELLOW YELLOW   APPearance HAZY (A) CLEAR   Specific Gravity, Urine 1.024 1.005 - 1.030   pH 5.0 5.0 - 8.0   Glucose, UA NEGATIVE NEGATIVE mg/dL   Hgb urine dipstick NEGATIVE NEGATIVE   Bilirubin Urine NEGATIVE NEGATIVE   Ketones, ur 20 (A) NEGATIVE mg/dL   Protein, ur 30 (A) NEGATIVE mg/dL   Nitrite NEGATIVE NEGATIVE   Leukocytes,Ua NEGATIVE NEGATIVE   RBC / HPF 0-5 0 - 5  RBC/hpf   WBC, UA 6-10 0 - 5 WBC/hpf   Bacteria, UA RARE (A) NONE SEEN   Squamous Epithelial / HPF 0-5 0 - 5 /HPF   Mucus PRESENT    Hyaline Casts, UA PRESENT     Comment: Performed at [facility], [place] [address], [place], [place] [address]  Hemoglobin and hematocrit, blood     Status: None   Collection Time: 11/26/22 12:19 AM  Result Value Ref Range   Hemoglobin 13.1 12.0 - 15.0 g/dL   HCT 38.8 36.0 - 46.0 %    Comment: Performed at [facility], [place] [address], [place], [place] [address]  Lactic acid, plasma     Status: None   Collection Time: 11/26/22 12:55 AM  Result Value Ref Range   Lactic Acid, Venous 1.0 0.5 - 1.9 mmol/L    Comment: Performed at [facility] [address], [place], [place] [address]  CBC     Status: Abnormal   Collection Time: 11/26/22  4:52 AM  Result Value Ref Range   WBC 14.2 (H) 4.0 - 10.5 K/uL   RBC 4.44 3.87 - 5.11 MIL/uL   Hemoglobin  12.3 12.0 - 15.0 g/dL   HCT 36.9 36.0 - 46.0 %   MCV 83.1 80.0 - 100.0 fL   MCH 27.7 26.0 - 34.0 pg   MCHC 33.3 30.0 - 36.0 g/dL   RDW 14.0 11.5 - 15.5 %   Platelets 262 150 - 400 K/uL   nRBC 0.0 0.0 - 0.2 %    Comment: Performed at [facility], [place] [address] [place], [place] [address]  Basic metabolic panel     Status: Abnormal   Collection Time: 11/26/22  4:52 AM  Result Value Ref Range   Sodium 137 135 - 145 mmol/L   Potassium 3.7 3.5 - 5.1 mmol/L   Chloride 104 98 - 111 mmol/L   CO2 24 22 - 32 mmol/L   Glucose, Bld 99 70 - 99 mg/dL    Comment: Glucose reference range applies only to samples taken after fasting for at least 8 hours.   BUN 24 (H) 8 - 23 mg/dL   Creatinine, Ser 1.13 (H) 0.44 - 1.00 mg/dL   Calcium 8.4 (L) 8.9 - 10.3 mg/dL   GFR, Estimated 52 (L) >60 mL/min    Comment: (NOTE) Calculated using the CKD-EPI Creatinine Equation (2021)    Anion gap 9 5 - 15    Comment: Performed at [facility] [address], [place], [place] [address]   VAS [place] LOWER EXTREMITY VENOUS (DVT)  Result Date: 11/26/2022  Lower Venous DVT Study Patient Name:  Adrieana ARDYS HATAWAY  Date of Exam:   11/26/2022 Medical Rec #: [id number]           Accession #:    [id number] Date of Birth: 1951-06-02           Patient Gender: F Patient Age:   20 years Exam Location:  [facility] Procedure:      VAS [place] LOWER EXTREMITY VENOUS (DVT) Referring Phys: Myles Rosenthal --------------------------------------------------------------------------------  Indications: Bilateral leg pain s/p fall and right IM nail.  Risk Factors: Cancer history - breast. Comparison Study: No prior studies. Performing Technologist: Darlin Coco RDMS, RVT  Examination Guidelines: A complete evaluation includes B-mode imaging, spectral Doppler, color Doppler, and power Doppler as needed of all accessible portions of each vessel. Bilateral testing is considered an integral part of a complete  examination. Limited  examinations for reoccurring indications may be performed as noted. The reflux portion of the exam is performed with the patient in reverse Trendelenburg.  +---------+---------------+---------+-----------+----------+--------------+ RIGHT    CompressibilityPhasicitySpontaneityPropertiesThrombus Aging +---------+---------------+---------+-----------+----------+--------------+ CFV      Full           Yes      Yes                                 +---------+---------------+---------+-----------+----------+--------------+ SFJ      Full                                                        +---------+---------------+---------+-----------+----------+--------------+ FV Prox  Full                                                        +---------+---------------+---------+-----------+----------+--------------+ FV Mid   Full                                                        +---------+---------------+---------+-----------+----------+--------------+ FV DistalFull                                                        +---------+---------------+---------+-----------+----------+--------------+ PFV      Full                                                        +---------+---------------+---------+-----------+----------+--------------+ POP      Full           Yes      Yes                                 +---------+---------------+---------+-----------+----------+--------------+ PTV      Full                                                        +---------+---------------+---------+-----------+----------+--------------+ PERO     Full                                                        +---------+---------------+---------+-----------+----------+--------------+   +---------+---------------+---------+-----------+----------+--------------+ LEFT     CompressibilityPhasicitySpontaneityPropertiesThrombus Aging  +---------+---------------+---------+-----------+----------+--------------+ CFV      Full  Yes      Yes                                 +---------+---------------+---------+-----------+----------+--------------+ SFJ      Full                                                        +---------+---------------+---------+-----------+----------+--------------+ FV Prox  Full                                                        +---------+---------------+---------+-----------+----------+--------------+ FV Mid   Full                                                        +---------+---------------+---------+-----------+----------+--------------+ FV DistalFull                                                        +---------+---------------+---------+-----------+----------+--------------+ PFV      Full                                                        +---------+---------------+---------+-----------+----------+--------------+ POP      Full           Yes      Yes                                 +---------+---------------+---------+-----------+----------+--------------+ PTV      Full                                                        +---------+---------------+---------+-----------+----------+--------------+ PERO     Full                                                        +---------+---------------+---------+-----------+----------+--------------+     Summary: RIGHT: - There is no evidence of deep vein thrombosis in the lower extremity.  - No cystic structure found in the popliteal fossa.  LEFT: - There is no evidence of deep vein thrombosis in the lower extremity.  - No cystic structure found in the popliteal fossa.  *See table(s) above for measurements and observations. Electronically signed by Servando Snare MD on 11/26/2022 at 3:06:12 PM.  Final    CT HEAD WO CONTRAST  Result Date: 11/25/2022 CLINICAL DATA:  Head trauma, moderate-severe;  Polytrauma, blunt EXAM: CT HEAD WITHOUT CONTRAST CT CERVICAL SPINE WITHOUT CONTRAST TECHNIQUE: Multidetector CT imaging of the head and cervical spine was performed following the standard protocol without intravenous contrast. Multiplanar CT image reconstructions of the cervical spine were also generated. RADIATION DOSE REDUCTION: This exam was performed according to the departmental dose-optimization program which includes automated exposure control, adjustment of the mA and/or kV according to patient size and/or use of iterative reconstruction technique. COMPARISON:  None Available. FINDINGS: CT HEAD FINDINGS Brain: No evidence of large-territorial acute infarction. No parenchymal hemorrhage. No mass lesion. No extra-axial collection. Empty sella. No mass effect or midline shift. No hydrocephalus. Basilar cisterns are patent. Vascular: No hyperdense vessel. Skull: No acute fracture or focal lesion. Sinuses/Orbits: Right maxillary sinus polypoid like mucosal thickening. Otherwise paranasal sinuses and mastoid air cells are clear. The orbits are unremarkable. Other: None. CT CERVICAL SPINE FINDINGS Alignment: Normal. Skull base and vertebrae: Multilevel moderate degenerative changes of the spine. No associated severe osseous neural foraminal or central canal stenosis. No acute fracture. No aggressive appearing focal osseous lesion or focal pathologic process. Soft tissues and spinal canal: No prevertebral fluid or swelling. No visible canal hematoma. Upper chest: Unremarkable. Other: None. IMPRESSION: 1. No acute intracranial abnormality. 2. No acute displaced fracture or traumatic listhesis of the cervical spine. 3. Empty sella. Findings is often a normal anatomic variant but can be associated with idiopathic intracranial hypertension (pseudotumor cerebri). Electronically Signed   By: Iven Finn M.D.   On: 11/25/2022 19:21   CT CERVICAL SPINE WO CONTRAST  Result Date: 11/25/2022 CLINICAL DATA:  Head trauma,  moderate-severe; Polytrauma, blunt EXAM: CT HEAD WITHOUT CONTRAST CT CERVICAL SPINE WITHOUT CONTRAST TECHNIQUE: Multidetector CT imaging of the head and cervical spine was performed following the standard protocol without intravenous contrast. Multiplanar CT image reconstructions of the cervical spine were also generated. RADIATION DOSE REDUCTION: This exam was performed according to the departmental dose-optimization program which includes automated exposure control, adjustment of the mA and/or kV according to patient size and/or use of iterative reconstruction technique. COMPARISON:  None Available. FINDINGS: CT HEAD FINDINGS Brain: No evidence of large-territorial acute infarction. No parenchymal hemorrhage. No mass lesion. No extra-axial collection. Empty sella. No mass effect or midline shift. No hydrocephalus. Basilar cisterns are patent. Vascular: No hyperdense vessel. Skull: No acute fracture or focal lesion. Sinuses/Orbits: Right maxillary sinus polypoid like mucosal thickening. Otherwise paranasal sinuses and mastoid air cells are clear. The orbits are unremarkable. Other: None. CT CERVICAL SPINE FINDINGS Alignment: Normal. Skull base and vertebrae: Multilevel moderate degenerative changes of the spine. No associated severe osseous neural foraminal or central canal stenosis. No acute fracture. No aggressive appearing focal osseous lesion or focal pathologic process. Soft tissues and spinal canal: No prevertebral fluid or swelling. No visible canal hematoma. Upper chest: Unremarkable. Other: None. IMPRESSION: 1. No acute intracranial abnormality. 2. No acute displaced fracture or traumatic listhesis of the cervical spine. 3. Empty sella. Findings is often a normal anatomic variant but can be associated with idiopathic intracranial hypertension (pseudotumor cerebri). Electronically Signed   By: Iven Finn M.D.   On: 11/25/2022 19:21   CT PELVIS WO CONTRAST  Result Date: 11/25/2022 CLINICAL DATA:   Pelvic fracture.  Fall. EXAM: CT PELVIS WITHOUT CONTRAST TECHNIQUE: Multidetector CT imaging of the pelvis was performed following the standard protocol without intravenous contrast. RADIATION DOSE REDUCTION: This exam was  performed according to the departmental dose-optimization program which includes automated exposure control, adjustment of the mA and/or kV according to patient size and/or use of iterative reconstruction technique. COMPARISON:  Pelvis x-ray same day. FINDINGS: Urinary Tract:  No abnormality visualized. Bowel: No acute abnormality. There is sigmoid colon diverticulosis. Vascular/Lymphatic: There are atherosclerotic calcifications of the aorta. No enlarged lymph nodes are seen. Reproductive:  Ovaries and uterus are within normal limits. Other: No pelvic free fluid. Small fat containing umbilical hernia. Musculoskeletal: There is an acute comminuted right femoral intratrochanteric fracture. There is 2 cm of medial displacement of the distal fracture fragment. There is no dislocation. Joint spaces are well maintained. Degenerative changes affect the sacroiliac joints and lower lumbar facet joints. IMPRESSION: 1. Acute comminuted right femoral intratrochanteric fracture. Aortic Atherosclerosis (ICD10-I70.0). Electronically Signed   By: Ronney Asters M.D.   On: 11/25/2022 18:52   DG FEMUR PORT 1V LEFT  Result Date: 11/25/2022 CLINICAL DATA:  Fall EXAM: LEFT FEMUR PORTABLE 1 VIEW COMPARISON:  None Available. FINDINGS: Femoral head projects in joint. No fracture or malalignment. Moderate degenerative changes at the knee IMPRESSION: No acute osseous abnormality Electronically Signed   By: Donavan Foil M.D.   On: 11/25/2022 18:38   DG FEMUR PORT, 1V RIGHT  Result Date: 11/25/2022 CLINICAL DATA:  Fall EXAM: RIGHT FEMUR PORTABLE 1 VIEW COMPARISON:  None Available. FINDINGS: Acute comminuted and displaced right intertrochanteric fracture. Mid to distal femur appears intact. No femoral head dislocation  IMPRESSION: Acute comminuted and displaced right intertrochanteric fracture. Electronically Signed   By: Donavan Foil M.D.   On: 11/25/2022 18:38   DG Pelvis Portable  Result Date: 11/25/2022 CLINICAL DATA:  Trauma EXAM: PORTABLE PELVIS 1-2 VIEWS COMPARISON:  None Available. FINDINGS: Limited by habitus. SI joints are non widened. Pubic symphysis and rami appear intact. Acute comminuted and displaced right intertrochanteric fracture. No femoral head dislocation IMPRESSION: Acute comminuted and displaced right intertrochanteric fracture. Electronically Signed   By: Donavan Foil M.D.   On: 11/25/2022 18:37   DG Chest Port 1 View  Result Date: 11/25/2022 CLINICAL DATA:  Trauma EXAM: PORTABLE CHEST 1 VIEW COMPARISON:  Chest x-ray 03/19/2016 FINDINGS: Cardiomediastinal silhouette is within normal limits for patient positioning. Is no focal lung infiltrate, pleural effusion or pneumothorax on this supine view. Surgical clips are seen in the right axilla. No acute fractures are identified. IMPRESSION: 1. No acute cardiopulmonary process. 2. No acute fractures are identified. Electronically Signed   By: Ronney Asters M.D.   On: 11/25/2022 18:37    Pending Labs Unresulted Labs (From admission, onward)     Start     Ordered   11/27/22 0500  CBC  Tomorrow morning,   R        11/26/22 1246   11/27/22 0500  Comprehensive metabolic panel  Tomorrow morning,   R        11/26/22 1246   11/27/22 0500  Magnesium  Tomorrow morning,   R        11/26/22 1246   11/27/22 0500  CK  Tomorrow morning,   R        11/26/22 1246   11/27/22 0500  Lactic acid, plasma  Tomorrow morning,   R        11/26/22 1246   11/26/22 0829  Procalcitonin - Baseline  ONCE - URGENT,   URGENT        11/26/22 0828   11/25/22 2032  Urinalysis, Routine w reflex microscopic Urine, In & Out  Cath  Once,   R        11/25/22 2032   11/25/22 1743  Urinalysis, Routine w reflex microscopic Urine, In & Out Cath  (Trauma Panel)  Once,   URGENT         11/25/22 1743            Vitals/Pain Today's Vitals   11/26/22 1130 11/26/22 1145 11/26/22 1200 11/26/22 1215  BP: 99/61 (!) 107/50 (!) 103/48 (!) 107/54  Pulse: (!) 106 (!) 101 96 97  Resp: '17 15 14 14  '$ Temp:    99.2 F (37.3 C)  TempSrc:      SpO2: 94% 91% 94% 95%  Weight:      Height:      PainSc:   Asleep     Isolation Precautions No active isolations  Medications Medications  HYDROcodone-acetaminophen (NORCO/VICODIN) 5-325 MG per tablet 1-2 tablet (2 tablets Oral Given 11/26/22 1017)  docusate sodium (COLACE) capsule 100 mg (100 mg Oral Given 11/26/22 1016)  polyethylene glycol (MIRALAX / GLYCOLAX) packet 17 g (has no administration in time range)  fentaNYL (SUBLIMAZE) injection 25 mcg (has no administration in time range)  0.9 %  sodium chloride infusion (0 mLs Intravenous Stopped 11/26/22 1105)  midazolam (VERSED) 2 MG/2ML injection (has no administration in time range)  fentaNYL (SUBLIMAZE) 100 MCG/2ML injection (  Canceled Entry 11/26/22 1200)  lactated ringers bolus 1,000 mL (0 mLs Intravenous Stopped 11/25/22 1944)  fentaNYL (SUBLIMAZE) injection 50 mcg (50 mcg Intravenous Given 11/25/22 1749)  fentaNYL (SUBLIMAZE) injection 50 mcg (50 mcg Intravenous Given 11/25/22 2056)  ketorolac (TORADOL) 15 MG/ML injection 15 mg (15 mg Intravenous Given 11/25/22 2057)  acetaminophen (TYLENOL) tablet 650 mg (650 mg Oral Given 11/25/22 2057)  sodium chloride 0.9 % bolus 1,000 mL (0 mLs Intravenous Stopped 11/26/22 0459)  midazolam (VERSED) injection 2 mg (2 mg Intravenous Given 11/26/22 1130)  fentaNYL (SUBLIMAZE) injection 50 mcg (50 mcg Intravenous Given 11/26/22 1201)    Mobility walks with device High fall risk   Focused Assessments    R Recommendations: See Admitting Provider Note  Report given to:   Additional Notes: A&O x 4. Went for nerve block earlier today. Will go to OR tonight.

## 2022-11-26 NOTE — Progress Notes (Signed)
ANTICOAGULATION CONSULT NOTE - Initial Consult  Pharmacy Consult for Lovenox Indication: VTE prophylaxis  Allergies  Allergen Reactions   Codeine Nausea And Vomiting    dizziness    Patient Measurements: Height: '5\' 5"'$  (165.1 cm) Weight: 106.1 kg (234 lb) IBW/kg (Calculated) : 57  Vital Signs: Temp: 97.5 F (36.4 C) (01/09 1915) Temp Source: Oral (01/09 1632) BP: 125/67 (01/09 2045) Pulse Rate: 128 (01/09 2045)  Labs: Recent Labs    11/25/22 1749 11/25/22 1751 11/25/22 1821 11/26/22 0019 11/26/22 0452  HGB 16.3* 15.5*  --  13.1 12.3  HCT 48.0* 49.7*  --  38.8 36.9  PLT  --  368  --   --  262  LABPROT  --   --  14.2  --   --   INR  --   --  1.1  --   --   CREATININE 0.70 0.78  --   --  1.13*  CKTOTAL  --  554*  --   --   --     Estimated Creatinine Clearance: 54.4 mL/min (A) (by C-G formula based on SCr of 1.13 mg/dL (H)).   Medical History: Past Medical History:  Diagnosis Date   Allergy    Breast cancer ([place])    History of chicken pox    Osteoarthritis    Neck,Back,Knees,Hips,Ankles   Personal history of chemotherapy    Personal history of radiation therapy     Medications:  Medications Prior to Admission  Medication Sig Dispense Refill Last Dose   Cholecalciferol (D-3-5) 125 MCG (5000 UT) capsule Take 5,000 Units by mouth once a week.   Past Week   clobetasol ointment (TEMOVATE) 4.48 % Apply 1 application topically 3 (three) times a week. Apply a thin layer on the vulva and perianal area HS x 2 weeks, then continue long term 3 times a week. 30 g 4 UNKNOWN   cyclobenzaprine (FLEXERIL) 10 MG tablet Take 10 mg by mouth daily as needed for muscle spasms.   UNKNOWN   HYDROcodone-acetaminophen (NORCO/VICODIN) 5-325 MG tablet Take 1 tablet by mouth every 6 (six) hours as needed for moderate pain.   Past Month   ibuprofen (ADVIL,MOTRIN) 200 MG tablet Take 200-400 mg by mouth daily as needed for fever or moderate pain. Reported on 03/22/2016   11/24/2022 at am    Multiple Vitamin (MULTIVITAMIN) tablet Take 1 tablet by mouth daily.   Past Month    Assessment: 72 y.o. F s/p femur fracture and underwent IM nailing 1/9. Pharmacy consulted to dose Lovenox for VTE prophylaxis.  Goal of Therapy:  Anti-Xa level 0.2-0.4 (4 hours post Lovenox dose) Monitor platelets by anticoagulation protocol: Yes   Plan:  Lovenox '40mg'$  q24h starting tomorrow (total hip fracture repair dosing) F/u transition to [place], PharmD, BCPS Please see amion for complete clinical pharmacist phone list 11/26/2022,9:11 PM

## 2022-11-26 NOTE — Progress Notes (Signed)
PROGRESS NOTE    Terri Mayer  KGM:[id number] DOB: Oct 08, 1951 DOA: 11/25/2022 PCP: [name], MD    Brief Narrative:   Terri Mayer is a 72 y.o. female with past medical history significant for history of breast cancer who presented to [facility] ED on 1/8 following mechanical fall at home approximately at 1845.  Patient immediately complained of pain to right lower extremity and inability to ambulate.  Patient apparently was down for roughly 22 hours before she was found by her neighbor and EMS was activated and patient was transported to ED for further evaluation.  Denies loss of consciousness.  In the ED, temperature 98.9 F, HR 132, RR 17, BP 136/88, SpO2 94% on 2 L nasal cannula.  Sodium 138, potassium 3.7, chloride 102, CO2 21, glucose 87, BUN 21, creatinine 0.78.  CK5 54.  Lactic acid 3.4.  Procalcitonin less than 0.10.  WBC 23.2, hemoglobin 15.5, platelets 368.  AST 39, ALT 22, total bilirubin 2.4.  INR 1.1.  CT head/C-spine with no acute intracranial abnormalities, no acute displaced fracture or traumatic listhesis of the C-spine, empty sella noted.  Chest x-ray with no acute cardiopulmonary disease process.  Right femur/pelvis with acute comminuted displaced right intratrochanteric fracture.  Orthopedics was consulted.  TRH consulted for admission for further evaluation management of right hip fracture.  Assessment & Plan:   Right intertrochanteric hip fracture Patient presenting via EMS after being found down at home following mechanical fall by neighbor.  Patient reports she tripped while putting her Christmas decorations away with immediate pain to her right hip and inability ambulate.  Imaging on arrival notable for acute comminuted displaced right intertrochanteric fracture. --Orthopedics following, appreciate assistance -- Norco 1-2 tablets every 6 hours as needed moderate/severe pain -- Fentanyl 25 mcg IV every 3 hours as needed severe breakthrough pain -- N.p.o.,  NS at 75 MLS per hour -- Will need PT/OT evaluation postoperatively  Morbid obesity Body mass index is 38.94 kg/m.  Discussed with patient needs for aggressive lifestyle changes/weight loss as this complicates all facets of care.  Outpatient follow-up with PCP.    DVT prophylaxis: SCDs Start: 11/25/22 2132    Code Status: Full Code Family Communication: No family present at bedside this morning  Disposition Plan:  Level of care: Progressive Status is: Inpatient Remains inpatient appropriate because: Pending surgical invention for right hip fracture    Consultants:  Orthopedics, Dr. Marcelino Scot  Procedures:  None  Antimicrobials:  None   Subjective: Patient seen examined bedside, resting calmly.  Remains in ED holding area.  Awaiting surgical management for right hip fracture later today.  Currently reports pain controlled.  No specific questions or concerns at this time.  Denies headache, no dizziness, no chest pain, no palpitations, no shortness of breath, no abdominal pain, no fever/chills/night sweats, no nausea/vomiting/diarrhea, no paresthesias.  No acute events overnight per nursing staff.  Objective: Vitals:   11/26/22 1130 11/26/22 1145 11/26/22 1200 11/26/22 1215  BP: 99/61 (!) 107/50 (!) 103/48 (!) 107/54  Pulse: (!) 106 (!) 101 96 97  Resp: '17 15 14 14  '$ Temp:    99.2 F (37.3 C)  TempSrc:      SpO2: 94% 91% 94% 95%  Weight:      Height:        Intake/Output Summary (Last 24 hours) at 11/26/2022 1236 Last data filed at 11/26/2022 0117 Gross per 24 hour  Intake --  Output 300 ml  Net -300 ml   [employer]  11/25/22 1730  Weight: 106.1 kg    Examination:  Physical Exam: GEN: NAD, alert and oriented x 3, obese HEENT: NCAT, PERRL, EOMI, sclera clear, MMM PULM: CTAB w/o wheezes/crackles, normal respiratory effort, on 2 L nasal cannula CV: RRR w/o M/G/R GI: abd soft, NTND, NABS, no R/G/M MSK: no peripheral edema, right lower extremity shortened,  externally rotated, otherwise moves all extremities independently, neurovascularly intact NEURO: CN II-XII intact, no focal deficits, sensation to light touch intact PSYCH: normal mood/affect Integumentary: dry/intact, no rashes or wounds    Data Reviewed: I have personally reviewed following labs and imaging studies  CBC: Recent Labs  Lab 11/25/22 1749 11/25/22 1751 11/26/22 0019 11/26/22 0452  WBC  --  23.2*  --  14.2*  HGB 16.3* 15.5* 13.1 12.3  HCT 48.0* 49.7* 38.8 36.9  MCV  --  86.0  --  83.1  PLT  --  368  --  677   Basic Metabolic Panel: Recent Labs  Lab 11/25/22 1749 11/25/22 1751 11/26/22 0452  NA 139 138 137  K 3.7 3.7 3.7  CL 103 102 104  CO2  --  21* 24  GLUCOSE 88 87 99  BUN 24* 21 24*  CREATININE 0.70 0.78 1.13*  CALCIUM  --  9.4 8.4*   GFR: Estimated Creatinine Clearance: 54.4 mL/min (A) (by C-G formula based on SCr of 1.13 mg/dL (H)). Liver Function Tests: Recent Labs  Lab 11/25/22 1751 11/25/22 2152  AST 39  --   ALT 22  --   ALKPHOS 54  --   BILITOT 2.4*  --   PROT 7.2  --   ALBUMIN 4.1 3.5   No results for input(s): "LIPASE", "AMYLASE" in the last 168 hours. No results for input(s): "AMMONIA" in the last 168 hours. Coagulation Profile: Recent Labs  Lab 11/25/22 1821  INR 1.1   Cardiac Enzymes: Recent Labs  Lab 11/25/22 1751  CKTOTAL 554*   BNP (last 3 results) No results for input(s): "PROBNP" in the last 8760 hours. HbA1C: No results for input(s): "HGBA1C" in the last 72 hours. CBG: No results for input(s): "GLUCAP" in the last 168 hours. Lipid Profile: No results for input(s): "CHOL", "HDL", "LDLCALC", "TRIG", "CHOLHDL", "LDLDIRECT" in the last 72 hours. Thyroid Function Tests: No results for input(s): "TSH", "T4TOTAL", "FREET4", "T3FREE", "THYROIDAB" in the last 72 hours. Anemia Panel: No results for input(s): "VITAMINB12", "FOLATE", "FERRITIN", "TIBC", "IRON", "RETICCTPCT" in the last 72 hours. Sepsis Labs: Recent  Labs  Lab 11/25/22 1751 11/25/22 2036 11/25/22 2223 11/26/22 0055  PROCALCITON <0.10  --   --   --   LATICACIDVEN 3.4* 2.4* 1.4 1.0    No results found for this or any previous visit (from the past 240 hour(s)).       Radiology Studies: VAS [place] LOWER EXTREMITY VENOUS (DVT)  Result Date: 11/26/2022  Lower Venous DVT Study Patient Name:  Terri Mayer  Date of Exam:   11/26/2022 Medical Rec #: [id number]           Accession #:    [id number] Date of Birth: [birth year]           Patient Gender: F Patient Age:   62 years Exam Location:  [facility] Procedure:      VAS [place] LOWER EXTREMITY VENOUS (DVT) Referring Phys: Myles Rosenthal --------------------------------------------------------------------------------  Indications: Bilateral leg pain s/p fall and right IM nail.  Risk Factors: Cancer history - breast. Comparison Study: No prior studies. Performing Technologist: Darlin Coco RDMS, RVT  Examination Guidelines: A complete evaluation includes B-mode imaging, spectral Doppler, color Doppler, and power Doppler as needed of all accessible portions of each vessel. Bilateral testing is considered an integral part of a complete examination. Limited examinations for reoccurring indications may be performed as noted. The reflux portion of the exam is performed with the patient in reverse Trendelenburg.  +---------+---------------+---------+-----------+----------+--------------+ RIGHT    CompressibilityPhasicitySpontaneityPropertiesThrombus Aging +---------+---------------+---------+-----------+----------+--------------+ CFV      Full           Yes      Yes                                 +---------+---------------+---------+-----------+----------+--------------+ SFJ      Full                                                        +---------+---------------+---------+-----------+----------+--------------+ FV Prox  Full                                                         +---------+---------------+---------+-----------+----------+--------------+ FV Mid   Full                                                        +---------+---------------+---------+-----------+----------+--------------+ FV DistalFull                                                        +---------+---------------+---------+-----------+----------+--------------+ PFV      Full                                                        +---------+---------------+---------+-----------+----------+--------------+ POP      Full           Yes      Yes                                 +---------+---------------+---------+-----------+----------+--------------+ PTV      Full                                                        +---------+---------------+---------+-----------+----------+--------------+ PERO     Full                                                        +---------+---------------+---------+-----------+----------+--------------+   +---------+---------------+---------+-----------+----------+--------------+  LEFT     CompressibilityPhasicitySpontaneityPropertiesThrombus Aging +---------+---------------+---------+-----------+----------+--------------+ CFV      Full           Yes      Yes                                 +---------+---------------+---------+-----------+----------+--------------+ SFJ      Full                                                        +---------+---------------+---------+-----------+----------+--------------+ FV Prox  Full                                                        +---------+---------------+---------+-----------+----------+--------------+ FV Mid   Full                                                        +---------+---------------+---------+-----------+----------+--------------+ FV DistalFull                                                         +---------+---------------+---------+-----------+----------+--------------+ PFV      Full                                                        +---------+---------------+---------+-----------+----------+--------------+ POP      Full           Yes      Yes                                 +---------+---------------+---------+-----------+----------+--------------+ PTV      Full                                                        +---------+---------------+---------+-----------+----------+--------------+ PERO     Full                                                        +---------+---------------+---------+-----------+----------+--------------+     Summary: RIGHT: - There is no evidence of deep vein thrombosis in the lower extremity.  - No cystic structure found in the popliteal fossa.  LEFT: - There is no evidence of deep vein thrombosis in the lower extremity.  - No  cystic structure found in the popliteal fossa.  *See table(s) above for measurements and observations.    Preliminary    CT HEAD WO CONTRAST  Result Date: 11/25/2022 CLINICAL DATA:  Head trauma, moderate-severe; Polytrauma, blunt EXAM: CT HEAD WITHOUT CONTRAST CT CERVICAL SPINE WITHOUT CONTRAST TECHNIQUE: Multidetector CT imaging of the head and cervical spine was performed following the standard protocol without intravenous contrast. Multiplanar CT image reconstructions of the cervical spine were also generated. RADIATION DOSE REDUCTION: This exam was performed according to the departmental dose-optimization program which includes automated exposure control, adjustment of the mA and/or kV according to patient size and/or use of iterative reconstruction technique. COMPARISON:  None Available. FINDINGS: CT HEAD FINDINGS Brain: No evidence of large-territorial acute infarction. No parenchymal hemorrhage. No mass lesion. No extra-axial collection. Empty sella. No mass effect or midline shift. No hydrocephalus. Basilar  cisterns are patent. Vascular: No hyperdense vessel. Skull: No acute fracture or focal lesion. Sinuses/Orbits: Right maxillary sinus polypoid like mucosal thickening. Otherwise paranasal sinuses and mastoid air cells are clear. The orbits are unremarkable. Other: None. CT CERVICAL SPINE FINDINGS Alignment: Normal. Skull base and vertebrae: Multilevel moderate degenerative changes of the spine. No associated severe osseous neural foraminal or central canal stenosis. No acute fracture. No aggressive appearing focal osseous lesion or focal pathologic process. Soft tissues and spinal canal: No prevertebral fluid or swelling. No visible canal hematoma. Upper chest: Unremarkable. Other: None. IMPRESSION: 1. No acute intracranial abnormality. 2. No acute displaced fracture or traumatic listhesis of the cervical spine. 3. Empty sella. Findings is often a normal anatomic variant but can be associated with idiopathic intracranial hypertension (pseudotumor cerebri). Electronically Signed   By: Iven Finn M.D.   On: 11/25/2022 19:21   CT CERVICAL SPINE WO CONTRAST  Result Date: 11/25/2022 CLINICAL DATA:  Head trauma, moderate-severe; Polytrauma, blunt EXAM: CT HEAD WITHOUT CONTRAST CT CERVICAL SPINE WITHOUT CONTRAST TECHNIQUE: Multidetector CT imaging of the head and cervical spine was performed following the standard protocol without intravenous contrast. Multiplanar CT image reconstructions of the cervical spine were also generated. RADIATION DOSE REDUCTION: This exam was performed according to the departmental dose-optimization program which includes automated exposure control, adjustment of the mA and/or kV according to patient size and/or use of iterative reconstruction technique. COMPARISON:  None Available. FINDINGS: CT HEAD FINDINGS Brain: No evidence of large-territorial acute infarction. No parenchymal hemorrhage. No mass lesion. No extra-axial collection. Empty sella. No mass effect or midline shift. No  hydrocephalus. Basilar cisterns are patent. Vascular: No hyperdense vessel. Skull: No acute fracture or focal lesion. Sinuses/Orbits: Right maxillary sinus polypoid like mucosal thickening. Otherwise paranasal sinuses and mastoid air cells are clear. The orbits are unremarkable. Other: None. CT CERVICAL SPINE FINDINGS Alignment: Normal. Skull base and vertebrae: Multilevel moderate degenerative changes of the spine. No associated severe osseous neural foraminal or central canal stenosis. No acute fracture. No aggressive appearing focal osseous lesion or focal pathologic process. Soft tissues and spinal canal: No prevertebral fluid or swelling. No visible canal hematoma. Upper chest: Unremarkable. Other: None. IMPRESSION: 1. No acute intracranial abnormality. 2. No acute displaced fracture or traumatic listhesis of the cervical spine. 3. Empty sella. Findings is often a normal anatomic variant but can be associated with idiopathic intracranial hypertension (pseudotumor cerebri). Electronically Signed   By: Iven Finn M.D.   On: 11/25/2022 19:21   CT PELVIS WO CONTRAST  Result Date: 11/25/2022 CLINICAL DATA:  Pelvic fracture.  Fall. EXAM: CT PELVIS WITHOUT CONTRAST TECHNIQUE: Multidetector CT imaging of  the pelvis was performed following the standard protocol without intravenous contrast. RADIATION DOSE REDUCTION: This exam was performed according to the departmental dose-optimization program which includes automated exposure control, adjustment of the mA and/or kV according to patient size and/or use of iterative reconstruction technique. COMPARISON:  Pelvis x-ray same day. FINDINGS: Urinary Tract:  No abnormality visualized. Bowel: No acute abnormality. There is sigmoid colon diverticulosis. Vascular/Lymphatic: There are atherosclerotic calcifications of the aorta. No enlarged lymph nodes are seen. Reproductive:  Ovaries and uterus are within normal limits. Other: No pelvic free fluid. Small fat containing  umbilical hernia. Musculoskeletal: There is an acute comminuted right femoral intratrochanteric fracture. There is 2 cm of medial displacement of the distal fracture fragment. There is no dislocation. Joint spaces are well maintained. Degenerative changes affect the sacroiliac joints and lower lumbar facet joints. IMPRESSION: 1. Acute comminuted right femoral intratrochanteric fracture. Aortic Atherosclerosis (ICD10-I70.0). Electronically Signed   By: Ronney Asters M.D.   On: 11/25/2022 18:52   DG FEMUR PORT 1V LEFT  Result Date: 11/25/2022 CLINICAL DATA:  Fall EXAM: LEFT FEMUR PORTABLE 1 VIEW COMPARISON:  None Available. FINDINGS: Femoral head projects in joint. No fracture or malalignment. Moderate degenerative changes at the knee IMPRESSION: No acute osseous abnormality Electronically Signed   By: Donavan Foil M.D.   On: 11/25/2022 18:38   DG FEMUR PORT, 1V RIGHT  Result Date: 11/25/2022 CLINICAL DATA:  Fall EXAM: RIGHT FEMUR PORTABLE 1 VIEW COMPARISON:  None Available. FINDINGS: Acute comminuted and displaced right intertrochanteric fracture. Mid to distal femur appears intact. No femoral head dislocation IMPRESSION: Acute comminuted and displaced right intertrochanteric fracture. Electronically Signed   By: Donavan Foil M.D.   On: 11/25/2022 18:38   DG Pelvis Portable  Result Date: 11/25/2022 CLINICAL DATA:  Trauma EXAM: PORTABLE PELVIS 1-2 VIEWS COMPARISON:  None Available. FINDINGS: Limited by habitus. SI joints are non widened. Pubic symphysis and rami appear intact. Acute comminuted and displaced right intertrochanteric fracture. No femoral head dislocation IMPRESSION: Acute comminuted and displaced right intertrochanteric fracture. Electronically Signed   By: Donavan Foil M.D.   On: 11/25/2022 18:37   DG Chest Port 1 View  Result Date: 11/25/2022 CLINICAL DATA:  Trauma EXAM: PORTABLE CHEST 1 VIEW COMPARISON:  Chest x-ray 03/19/2016 FINDINGS: Cardiomediastinal silhouette is within normal  limits for patient positioning. Is no focal lung infiltrate, pleural effusion or pneumothorax on this supine view. Surgical clips are seen in the right axilla. No acute fractures are identified. IMPRESSION: 1. No acute cardiopulmonary process. 2. No acute fractures are identified. Electronically Signed   By: Ronney Asters M.D.   On: 11/25/2022 18:37        Scheduled Meds:  docusate sodium  100 mg Oral BID   [id number]       midazolam       Continuous Infusions:  sodium chloride 75 mL/hr at 11/25/22 2214     LOS: 1 day    Time spent: 52 minutes spent on chart review, discussion with nursing staff, consultants, updating family and interview/physical exam; more than 50% of that time was spent in counseling and/or coordination of care.    Palmira Stickle J [place] DO [facility] Available via Epic secure chat 7am-7pm After these hours, please refer to coverage provider listed on [email or web address] 11/26/2022, 12:36 PM

## 2022-11-26 NOTE — Anesthesia Procedure Notes (Signed)
Procedure Name: Intubation Date/Time: 11/26/2022 4:57 PM  Performed by: [name], CRNAPre-anesthesia Checklist: Patient identified, Emergency Drugs available, Suction available and Patient being monitored Patient Re-evaluated:Patient Re-evaluated prior to induction Oxygen Delivery Method: Circle System Utilized Preoxygenation: Pre-oxygenation with 100% oxygen Induction Type: IV induction Ventilation: Mask ventilation without difficulty Laryngoscope Size: Mac and 3 Grade View: Grade I Tube type: Oral Tube size: 7.0 mm Number of attempts: 1 Airway Equipment and Method: Stylet Placement Confirmation: ETT inserted through vocal cords under direct vision, positive ETCO2 and breath sounds checked- equal and bilateral Secured at: 21 cm Tube secured with: Tape Dental Injury: Teeth and Oropharynx as per pre-operative assessment

## 2022-11-26 NOTE — Progress Notes (Signed)
Received consult from EDP last night.  I have discussed this case with Dr. Marcelino Scot, who has graciously agreed to perform surgical repair today.

## 2022-11-26 NOTE — Anesthesia Procedure Notes (Signed)
Anesthesia Regional Block: Peng block   Pre-Anesthetic Checklist: , timeout performed,  Correct Patient, Correct Site, Correct Laterality,  Correct Procedure, Correct Position, site marked,  Risks and benefits discussed,  Surgical consent,  Pre-op evaluation,  At surgeon's request and post-op pain management  Laterality: Right  Prep: chloraprep       Needles:  Injection technique: Single-shot  Needle Type: Echogenic Stimulator Needle     Needle Length: 5cm  Needle Gauge: 22     Additional Needles:   Procedures:,,,, ultrasound used (permanent image in chart),,    Narrative:  Start time: 11/26/2022 11:40 AM End time: 11/26/2022 11:47 AM Injection made incrementally with aspirations every 5 mL.  Performed by: Personally  Anesthesiologist: Janeece Riggers, MD  Additional Notes: Functioning IV was confirmed and monitors were applied.  A 108m 22ga Arrow echogenic stimulator needle was used. Sterile prep and drape,hand hygiene and sterile gloves were used. Ultrasound guidance: relevant anatomy identified, needle position confirmed, local anesthetic spread visualized around nerve(s)., vascular puncture avoided.  Image printed for medical record. Negative aspiration and negative test dose prior to incremental administration of local anesthetic. The patient tolerated the procedure well.

## 2022-11-26 NOTE — Transfer of Care (Signed)
Immediate Anesthesia Transfer of Care Note  Patient: Terri Mayer  Procedure(s) Performed: INTRAMEDULLARY (IM) NAIL INTERTROCHANTERIC (Right)  Patient Location: PACU  Anesthesia Type:General  Level of Consciousness: drowsy and patient cooperative  Airway & Oxygen Therapy: Patient Spontanous Breathing and Patient connected to face mask oxygen  Post-op Assessment: Report given to RN and Post -op Vital signs reviewed and stable  Post vital signs: Reviewed and stable  Last Vitals:  Vitals Value Taken Time  BP    Temp    Pulse 133 11/26/22 1922  Resp 20 11/26/22 1922  SpO2 96 % 11/26/22 1922  Vitals shown include unvalidated device data.  Last Pain:  Vitals:   11/26/22 1632  TempSrc: Oral  PainSc: 2       Patients Stated Pain Goal: 0 (25/42/70 6237)  Complications: No notable events documented.

## 2022-11-26 NOTE — Progress Notes (Signed)
PT Cancellation Note  Patient Details Name: Terri Mayer MRN: [id number] DOB: 02-10-1951   Cancelled Treatment:    Reason Eval/Treat Not Completed: Medical issues which prohibited therapy.  Surgical repair of femur fracture today, recheck tomorrow.   Ramond Dial 11/26/2022, 9:35 AM  Mee Hives, PT PhD Acute Rehab Dept. Number: [place]

## 2022-11-26 NOTE — Consult Note (Signed)
Reason for Consult:Right hip fx Referring Physician: Eric [place] Time called: 1330 Time at bedside: [place] is an 72 y.o. female.  HPI: Terri Mayer was out in her storage building and tripped over some objects. She had immediate right hip pain and could not get up. She lay there for nearly 24h before she was able to summon help. She was brought to the ED where x-rays showed a right hip fx and orthopedic surgery was consulted. She lives alone and very occasionally uses a cane or walking sticks to ambulate.  Past Medical History:  Diagnosis Date   Allergy    Breast cancer ([place])    History of chicken pox    Osteoarthritis    Neck,Back,Knees,Hips,Ankles   Personal history of chemotherapy    Personal history of radiation therapy     Past Surgical History:  Procedure Laterality Date   BREAST LUMPECTOMY Right 2017   DILATION AND CURETTAGE OF UTERUS     EYE SURGERY  at age 47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   PORTACATH PLACEMENT Right 12/11/2015   Procedure: INSERTION PORT-A-CATH WITH ULTRASOUND;  Surgeon: Rolm Bookbinder, MD;  Location: [facility];  Service: General;  Laterality: Right;   RADIOACTIVE SEED GUIDED PARTIAL MASTECTOMY/AXILLARY SENTINEL NODE BIOPSY/AXILLARY NODE DISSECTION Right 05/02/2016   Procedure: RIGHT BREAST SEED GUIDED LUMPECTOMY AND RIGHT SEED TARGETED AXILLARY DISSECTION,AND SENTINEL NODE BIOPSY;  Surgeon: Rolm Bookbinder, MD;  Location: [facility];  Service: General;  Laterality: Right;  RIGHT BREAST SEED GUIDED LUMPECTOMY AND RIGHT SEED TARGETED AXILLARY DISSECTION,AND SENTINEL NODE BIOPSY   WISDOM TOOTH EXTRACTION      Family History  Problem Relation Age of Onset   Heart disease Father    Diabetes Other        GM   Dementia Other        M   Transient ischemic attack Other        F   Charcot-Marie-Tooth disease Brother    Colon cancer Neg Hx    Breast cancer Neg Hx     Social History:  reports that she has never  smoked. She has never used smokeless tobacco. She reports that she does not currently use alcohol. She reports that she does not currently use drugs.  Allergies:  Allergies  Allergen Reactions   Codeine Nausea And Vomiting    dizziness    Medications: I have reviewed the patient's current medications.  Results for orders placed or performed during the hospital encounter of 11/25/22 (from the past 48 hour(s))  Sample to Blood Bank     Status: None   Collection Time: 11/25/22  5:30 PM  Result Value Ref Range   Blood Bank Specimen SAMPLE AVAILABLE FOR TESTING    Sample Expiration      11/26/2022,2359 Performed at [facility], [place] [address], [place], [place] [address]   Type and screen [place]     Status: None   Collection Time: 11/25/22  5:30 PM  Result Value Ref Range   ABO/RH(D) B POS    Antibody Screen NEG    Sample Expiration      11/28/2022,2359 Performed at [facility], [place] [address] [place], [place] [address]   I-Stat Chem 8, ED     Status: Abnormal   Collection Time: 11/25/22  5:49 PM  Result Value Ref Range   Sodium 139 135 - 145 mmol/L   Potassium 3.7 3.5 - 5.1 mmol/L  Chloride 103 98 - 111 mmol/L   BUN 24 (H) 8 - 23 mg/dL   Creatinine, Ser 0.70 0.44 - 1.00 mg/dL   Glucose, Bld 88 70 - 99 mg/dL    Comment: Glucose reference range applies only to samples taken after fasting for at least 8 hours.   Calcium, Ion 1.10 (L) 1.15 - 1.40 mmol/L   TCO2 22 22 - 32 mmol/L   Hemoglobin 16.3 (H) 12.0 - 15.0 g/dL   HCT 48.0 (H) 36.0 - 46.0 %  Comprehensive metabolic panel     Status: Abnormal   Collection Time: 11/25/22  5:51 PM  Result Value Ref Range   Sodium 138 135 - 145 mmol/L   Potassium 3.7 3.5 - 5.1 mmol/L   Chloride 102 98 - 111 mmol/L   CO2 21 (L) 22 - 32 mmol/L   Glucose, Bld 87 70 - 99 mg/dL    Comment: Glucose reference range applies only to samples taken after fasting for at least 8 hours.   BUN 21 8 - 23 mg/dL    Creatinine, Ser 0.78 0.44 - 1.00 mg/dL   Calcium 9.4 8.9 - 10.3 mg/dL   Total Protein 7.2 6.5 - 8.1 g/dL   Albumin 4.1 3.5 - 5.0 g/dL   AST 39 15 - 41 U/L   ALT 22 0 - 44 U/L   Alkaline Phosphatase 54 38 - 126 U/L   Total Bilirubin 2.4 (H) 0.3 - 1.2 mg/dL   GFR, Estimated >60 >60 mL/min    Comment: (NOTE) Calculated using the CKD-EPI Creatinine Equation (2021)    Anion gap 15 5 - 15    Comment: Performed at [facility] [address] [place], [place] [address]  CBC     Status: Abnormal   Collection Time: 11/25/22  5:51 PM  Result Value Ref Range   WBC 23.2 (H) 4.0 - 10.5 K/uL   RBC 5.78 (H) 3.87 - 5.11 MIL/uL   Hemoglobin 15.5 (H) 12.0 - 15.0 g/dL   HCT 49.7 (H) 36.0 - 46.0 %   MCV 86.0 80.0 - 100.0 fL   MCH 26.8 26.0 - 34.0 pg   MCHC 31.2 30.0 - 36.0 g/dL   RDW 13.7 11.5 - 15.5 %   Platelets 368 150 - 400 K/uL   nRBC 0.0 0.0 - 0.2 %    Comment: Performed at [facility], [place] [address], [place], [place] [address]  Ethanol     Status: None   Collection Time: 11/25/22  5:51 PM  Result Value Ref Range   Alcohol, Ethyl (B) <10 <10 mg/dL    Comment: (NOTE) Lowest detectable limit for serum alcohol is 10 mg/dL.  For medical purposes only. Performed at [facility], [place] [address] [place], [place] [address]   Lactic acid, plasma     Status: Abnormal   Collection Time: 11/25/22  5:51 PM  Result Value Ref Range   Lactic Acid, Venous 3.4 (HH) 0.5 - 1.9 mmol/L    Comment: CRITICAL RESULT CALLED TO, READ BACK BY AND VERIFIED WITH P. PULLIAM RN 11/25/22 '@1849'$  BY J. WHITE Performed at [facility] [address], [place], [place] [address]   CK     Status: Abnormal   Collection Time: 11/25/22  5:51 PM  Result Value Ref Range   Total CK 554 (H) 38 - 234 U/L    Comment: Performed at [facility], [place] [address] [place], [place] [address]  Procalcitonin -  Baseline     Status: None   Collection Time: 11/25/22  5:51 PM  Result Value Ref Range    Procalcitonin <0.10 ng/mL    Comment:        Interpretation: PCT (Procalcitonin) <= 0.5 ng/mL: Systemic infection (sepsis) is not likely. Local bacterial infection is possible. (NOTE)       Sepsis PCT Algorithm           Lower Respiratory Tract                                      Infection PCT Algorithm    ----------------------------     ----------------------------         PCT < 0.25 ng/mL                PCT < 0.10 ng/mL          Strongly encourage             Strongly discourage   discontinuation of antibiotics    initiation of antibiotics    ----------------------------     -----------------------------       PCT 0.25 - 0.50 ng/mL            PCT 0.10 - 0.25 ng/mL               OR       >80% decrease in PCT            Discourage initiation of                                            antibiotics      Encourage discontinuation           of antibiotics    ----------------------------     -----------------------------         PCT >= 0.50 ng/mL              PCT 0.26 - 0.50 ng/mL               AND        <80% decrease in PCT             Encourage initiation of                                             antibiotics       Encourage continuation           of antibiotics    ----------------------------     -----------------------------        PCT >= 0.50 ng/mL                  PCT > 0.50 ng/mL               AND         increase in PCT                  Strongly encourage                                      initiation  of antibiotics    Strongly encourage escalation           of antibiotics                                     -----------------------------                                           PCT <= 0.25 ng/mL                                                 OR                                        > 80% decrease in PCT                                      Discontinue / Do not initiate                                             antibiotics  Performed at [facility], [address] [place], [place] [address]   Protime-INR     Status: None   Collection Time: 11/25/22  6:21 PM  Result Value Ref Range   Prothrombin Time 14.2 11.4 - 15.2 seconds   INR 1.1 0.8 - 1.2    Comment: (NOTE) INR goal varies based on device and disease states. Performed at [facility], [place] [address], [place], [place] [address]   Lactic acid, plasma     Status: Abnormal   Collection Time: 11/25/22  8:36 PM  Result Value Ref Range   Lactic Acid, Venous 2.4 (HH) 0.5 - 1.9 mmol/L    Comment: CRITICAL VALUE NOTED. VALUE IS CONSISTENT WITH PREVIOUSLY REPORTED/CALLED VALUE Performed at [facility], [place] [address], [place], [place] [address]   VITAMIN D 25 Hydroxy (Vit-D Deficiency, Fractures)     Status: Abnormal   Collection Time: 11/25/22  9:52 PM  Result Value Ref Range   Vit D, 25-Hydroxy 18.39 (L) 30 - 100 ng/mL    Comment: (NOTE) Vitamin D deficiency has been defined by the [place] practice guideline as a level of serum 25-OH  vitamin D less than 20 ng/mL (1,2). The Endocrine Society went on to  further define vitamin D insufficiency as a level between 21 and 29  ng/mL (2).  1. IOM (Institute of Medicine). 2010. Dietary reference intakes for  calcium and D. [place]: [employer]. 2. Holick MF, Binkley [place], Bischoff-Ferrari HA, et al. Evaluation,  treatment, and prevention of vitamin D deficiency: an Endocrine  Society clinical practice guideline, JCEM. 2011 Jul; 96(7): 1911-30.  Performed at [facility], [place] [address], [place], [place] [address]   Albumin     Status: None   Collection  Time: 11/25/22  9:52 PM  Result Value Ref Range   Albumin 3.5 3.5 - 5.0 g/dL    Comment: Performed at [facility], [place] [address] [place], [place] [address]  ABO/Rh     Status: None   Collection Time: 11/25/22  9:52 PM  Result Value Ref Range   ABO/RH(D)      B POS Performed at [facility] [address] [place], [place] [address]   C-reactive protein     Status: Abnormal   Collection Time: 11/25/22  9:52 PM  Result Value Ref Range   CRP 8.0 (H) <1.0 mg/dL    Comment: Performed at [facility], [place] [address], [place], [place] [address]  Lactic acid, plasma     Status: None   Collection Time: 11/25/22 10:23 PM  Result Value Ref Range   Lactic Acid, Venous 1.4 0.5 - 1.9 mmol/L    Comment: Performed at [facility] [address], [place], [place] [address]  D-dimer, quantitative     Status: Abnormal   Collection Time: 11/25/22 10:23 PM  Result Value Ref Range   D-Dimer, Quant 1.62 (H) 0.00 - 0.50 ug/mL-FEU    Comment: (NOTE) At the manufacturer cut-off value of 0.5 g/mL FEU, this assay has a negative predictive value of 95-100%.This assay is intended for use in conjunction with a clinical pretest probability (PTP) assessment model to exclude pulmonary embolism (PE) and deep venous thrombosis (DVT) in outpatients suspected of PE or DVT. Results should be correlated with clinical presentation. Performed at [facility], [place] [address] [place], [place] [address]   Urinalysis, Routine w reflex microscopic Urine, In & Out Cath     Status: Abnormal   Collection Time: 11/25/22 10:45 PM  Result Value Ref Range   Color, Urine YELLOW YELLOW   APPearance HAZY (A) CLEAR   Specific Gravity, Urine 1.024 1.005 - 1.030   pH 5.0 5.0 - 8.0   Glucose, UA NEGATIVE NEGATIVE mg/dL   Hgb urine dipstick NEGATIVE NEGATIVE   Bilirubin Urine NEGATIVE NEGATIVE   Ketones, ur 20 (A) NEGATIVE mg/dL   Protein, ur 30 (A) NEGATIVE mg/dL   Nitrite NEGATIVE NEGATIVE   Leukocytes,Ua NEGATIVE NEGATIVE   RBC / HPF 0-5 0 - 5 RBC/hpf   WBC, UA 6-10 0 - 5 WBC/hpf   Bacteria, UA RARE (A) NONE SEEN   Squamous Epithelial / HPF 0-5 0 - 5 /HPF   Mucus PRESENT    Hyaline Casts, UA PRESENT     Comment: Performed at [facility], [place] [address] [place], [place]  [address]  Hemoglobin and hematocrit, blood     Status: None   Collection Time: 11/26/22 12:19 AM  Result Value Ref Range   Hemoglobin 13.1 12.0 - 15.0 g/dL   HCT 38.8 36.0 - 46.0 %    Comment: Performed at [facility], [place] [address] [place], [place] [address]  Lactic acid, plasma     Status: None   Collection Time: 11/26/22 12:55 AM  Result Value Ref Range   Lactic Acid, Venous 1.0 0.5 - 1.9 mmol/L    Comment: Performed at [facility] [address] [place] [address]  CBC     Status: Abnormal   Collection Time: 11/26/22  4:52 AM  Result Value Ref Range   WBC 14.2 (H) 4.0 - 10.5 K/uL   RBC 4.44 3.87 - 5.11 MIL/uL   Hemoglobin  12.3 12.0 - 15.0 g/dL   HCT 36.9 36.0 - 46.0 %   MCV 83.1 80.0 - 100.0 fL   MCH 27.7 26.0 - 34.0 pg   MCHC 33.3 30.0 - 36.0 g/dL   RDW 14.0 11.5 - 15.5 %   Platelets 262 150 - 400 K/uL   nRBC 0.0 0.0 - 0.2 %    Comment: Performed at [facility], [place] [address], [place], [place] [address]  Basic metabolic panel     Status: Abnormal   Collection Time: 11/26/22  4:52 AM  Result Value Ref Range   Sodium 137 135 - 145 mmol/L   Potassium 3.7 3.5 - 5.1 mmol/L   Chloride 104 98 - 111 mmol/L   CO2 24 22 - 32 mmol/L   Glucose, Bld 99 70 - 99 mg/dL    Comment: Glucose reference range applies only to samples taken after fasting for at least 8 hours.   BUN 24 (H) 8 - 23 mg/dL   Creatinine, Ser 1.13 (H) 0.44 - 1.00 mg/dL   Calcium 8.4 (L) 8.9 - 10.3 mg/dL   GFR, Estimated 52 (L) >60 mL/min    Comment: (NOTE) Calculated using the CKD-EPI Creatinine Equation (2021)    Anion gap 9 5 - 15    Comment: Performed at [facility] [address] [place], [place] [address]    VAS [place] LOWER EXTREMITY VENOUS (DVT)  Result Date: 11/26/2022  Lower Venous DVT Study Patient Name:  Lenor SHARENE KRIKORIAN  Date of Exam:   11/26/2022 Medical Rec #: [id number]           Accession #:    [id number] Date of Birth: June 15, 1951           Patient Gender: F  Patient Age:   24 years Exam Location:  [facility] Procedure:      VAS [place] LOWER EXTREMITY VENOUS (DVT) Referring Phys: Myles Rosenthal --------------------------------------------------------------------------------  Indications: Bilateral leg pain s/p fall and right IM nail.  Risk Factors: Cancer history - breast. Comparison Study: No prior studies. Performing Technologist: Darlin Coco RDMS, RVT  Examination Guidelines: A complete evaluation includes B-mode imaging, spectral Doppler, color Doppler, and power Doppler as needed of all accessible portions of each vessel. Bilateral testing is considered an integral part of a complete examination. Limited examinations for reoccurring indications may be performed as noted. The reflux portion of the exam is performed with the patient in reverse Trendelenburg.  +---------+---------------+---------+-----------+----------+--------------+ RIGHT    CompressibilityPhasicitySpontaneityPropertiesThrombus Aging +---------+---------------+---------+-----------+----------+--------------+ CFV      Full           Yes      Yes                                 +---------+---------------+---------+-----------+----------+--------------+ SFJ      Full                                                        +---------+---------------+---------+-----------+----------+--------------+ FV Prox  Full                                                        +---------+---------------+---------+-----------+----------+--------------+  FV Mid   Full                                                        +---------+---------------+---------+-----------+----------+--------------+ FV DistalFull                                                        +---------+---------------+---------+-----------+----------+--------------+ PFV      Full                                                         +---------+---------------+---------+-----------+----------+--------------+ POP      Full           Yes      Yes                                 +---------+---------------+---------+-----------+----------+--------------+ PTV      Full                                                        +---------+---------------+---------+-----------+----------+--------------+ PERO     Full                                                        +---------+---------------+---------+-----------+----------+--------------+   +---------+---------------+---------+-----------+----------+--------------+ LEFT     CompressibilityPhasicitySpontaneityPropertiesThrombus Aging +---------+---------------+---------+-----------+----------+--------------+ CFV      Full           Yes      Yes                                 +---------+---------------+---------+-----------+----------+--------------+ SFJ      Full                                                        +---------+---------------+---------+-----------+----------+--------------+ FV Prox  Full                                                        +---------+---------------+---------+-----------+----------+--------------+ FV Mid   Full                                                        +---------+---------------+---------+-----------+----------+--------------+  FV DistalFull                                                        +---------+---------------+---------+-----------+----------+--------------+ PFV      Full                                                        +---------+---------------+---------+-----------+----------+--------------+ POP      Full           Yes      Yes                                 +---------+---------------+---------+-----------+----------+--------------+ PTV      Full                                                         +---------+---------------+---------+-----------+----------+--------------+ PERO     Full                                                        +---------+---------------+---------+-----------+----------+--------------+     Summary: RIGHT: - There is no evidence of deep vein thrombosis in the lower extremity.  - No cystic structure found in the popliteal fossa.  LEFT: - There is no evidence of deep vein thrombosis in the lower extremity.  - No cystic structure found in the popliteal fossa.  *See table(s) above for measurements and observations.    Preliminary    CT HEAD WO CONTRAST  Result Date: 11/25/2022 CLINICAL DATA:  Head trauma, moderate-severe; Polytrauma, blunt EXAM: CT HEAD WITHOUT CONTRAST CT CERVICAL SPINE WITHOUT CONTRAST TECHNIQUE: Multidetector CT imaging of the head and cervical spine was performed following the standard protocol without intravenous contrast. Multiplanar CT image reconstructions of the cervical spine were also generated. RADIATION DOSE REDUCTION: This exam was performed according to the departmental dose-optimization program which includes automated exposure control, adjustment of the mA and/or kV according to patient size and/or use of iterative reconstruction technique. COMPARISON:  None Available. FINDINGS: CT HEAD FINDINGS Brain: No evidence of large-territorial acute infarction. No parenchymal hemorrhage. No mass lesion. No extra-axial collection. Empty sella. No mass effect or midline shift. No hydrocephalus. Basilar cisterns are patent. Vascular: No hyperdense vessel. Skull: No acute fracture or focal lesion. Sinuses/Orbits: Right maxillary sinus polypoid like mucosal thickening. Otherwise paranasal sinuses and mastoid air cells are clear. The orbits are unremarkable. Other: None. CT CERVICAL SPINE FINDINGS Alignment: Normal. Skull base and vertebrae: Multilevel moderate degenerative changes of the spine. No associated severe osseous neural foraminal or central canal  stenosis. No acute fracture. No aggressive appearing focal osseous lesion or focal pathologic process. Soft tissues and spinal canal: No prevertebral fluid or swelling. No visible canal hematoma. Upper chest: Unremarkable. Other: None. IMPRESSION: 1. No acute intracranial  abnormality. 2. No acute displaced fracture or traumatic listhesis of the cervical spine. 3. Empty sella. Findings is often a normal anatomic variant but can be associated with idiopathic intracranial hypertension (pseudotumor cerebri). Electronically Signed   By: Iven Finn M.D.   On: 11/25/2022 19:21   CT CERVICAL SPINE WO CONTRAST  Result Date: 11/25/2022 CLINICAL DATA:  Head trauma, moderate-severe; Polytrauma, blunt EXAM: CT HEAD WITHOUT CONTRAST CT CERVICAL SPINE WITHOUT CONTRAST TECHNIQUE: Multidetector CT imaging of the head and cervical spine was performed following the standard protocol without intravenous contrast. Multiplanar CT image reconstructions of the cervical spine were also generated. RADIATION DOSE REDUCTION: This exam was performed according to the departmental dose-optimization program which includes automated exposure control, adjustment of the mA and/or kV according to patient size and/or use of iterative reconstruction technique. COMPARISON:  None Available. FINDINGS: CT HEAD FINDINGS Brain: No evidence of large-territorial acute infarction. No parenchymal hemorrhage. No mass lesion. No extra-axial collection. Empty sella. No mass effect or midline shift. No hydrocephalus. Basilar cisterns are patent. Vascular: No hyperdense vessel. Skull: No acute fracture or focal lesion. Sinuses/Orbits: Right maxillary sinus polypoid like mucosal thickening. Otherwise paranasal sinuses and mastoid air cells are clear. The orbits are unremarkable. Other: None. CT CERVICAL SPINE FINDINGS Alignment: Normal. Skull base and vertebrae: Multilevel moderate degenerative changes of the spine. No associated severe osseous neural foraminal  or central canal stenosis. No acute fracture. No aggressive appearing focal osseous lesion or focal pathologic process. Soft tissues and spinal canal: No prevertebral fluid or swelling. No visible canal hematoma. Upper chest: Unremarkable. Other: None. IMPRESSION: 1. No acute intracranial abnormality. 2. No acute displaced fracture or traumatic listhesis of the cervical spine. 3. Empty sella. Findings is often a normal anatomic variant but can be associated with idiopathic intracranial hypertension (pseudotumor cerebri). Electronically Signed   By: Iven Finn M.D.   On: 11/25/2022 19:21   CT PELVIS WO CONTRAST  Result Date: 11/25/2022 CLINICAL DATA:  Pelvic fracture.  Fall. EXAM: CT PELVIS WITHOUT CONTRAST TECHNIQUE: Multidetector CT imaging of the pelvis was performed following the standard protocol without intravenous contrast. RADIATION DOSE REDUCTION: This exam was performed according to the departmental dose-optimization program which includes automated exposure control, adjustment of the mA and/or kV according to patient size and/or use of iterative reconstruction technique. COMPARISON:  Pelvis x-ray same day. FINDINGS: Urinary Tract:  No abnormality visualized. Bowel: No acute abnormality. There is sigmoid colon diverticulosis. Vascular/Lymphatic: There are atherosclerotic calcifications of the aorta. No enlarged lymph nodes are seen. Reproductive:  Ovaries and uterus are within normal limits. Other: No pelvic free fluid. Small fat containing umbilical hernia. Musculoskeletal: There is an acute comminuted right femoral intratrochanteric fracture. There is 2 cm of medial displacement of the distal fracture fragment. There is no dislocation. Joint spaces are well maintained. Degenerative changes affect the sacroiliac joints and lower lumbar facet joints. IMPRESSION: 1. Acute comminuted right femoral intratrochanteric fracture. Aortic Atherosclerosis (ICD10-I70.0). Electronically Signed   By: Ronney Asters M.D.   On: 11/25/2022 18:52   DG FEMUR PORT 1V LEFT  Result Date: 11/25/2022 CLINICAL DATA:  Fall EXAM: LEFT FEMUR PORTABLE 1 VIEW COMPARISON:  None Available. FINDINGS: Femoral head projects in joint. No fracture or malalignment. Moderate degenerative changes at the knee IMPRESSION: No acute osseous abnormality Electronically Signed   By: Donavan Foil M.D.   On: 11/25/2022 18:38   DG FEMUR PORT, 1V RIGHT  Result Date: 11/25/2022 CLINICAL DATA:  Fall EXAM: RIGHT FEMUR PORTABLE 1 VIEW  COMPARISON:  None Available. FINDINGS: Acute comminuted and displaced right intertrochanteric fracture. Mid to distal femur appears intact. No femoral head dislocation IMPRESSION: Acute comminuted and displaced right intertrochanteric fracture. Electronically Signed   By: Donavan Foil M.D.   On: 11/25/2022 18:38   DG Pelvis Portable  Result Date: 11/25/2022 CLINICAL DATA:  Trauma EXAM: PORTABLE PELVIS 1-2 VIEWS COMPARISON:  None Available. FINDINGS: Limited by habitus. SI joints are non widened. Pubic symphysis and rami appear intact. Acute comminuted and displaced right intertrochanteric fracture. No femoral head dislocation IMPRESSION: Acute comminuted and displaced right intertrochanteric fracture. Electronically Signed   By: Donavan Foil M.D.   On: 11/25/2022 18:37   DG Chest Port 1 View  Result Date: 11/25/2022 CLINICAL DATA:  Trauma EXAM: PORTABLE CHEST 1 VIEW COMPARISON:  Chest x-ray 03/19/2016 FINDINGS: Cardiomediastinal silhouette is within normal limits for patient positioning. Is no focal lung infiltrate, pleural effusion or pneumothorax on this supine view. Surgical clips are seen in the right axilla. No acute fractures are identified. IMPRESSION: 1. No acute cardiopulmonary process. 2. No acute fractures are identified. Electronically Signed   By: Ronney Asters M.D.   On: 11/25/2022 18:37    Review of Systems  HENT:  Negative for ear discharge, ear pain, hearing loss and tinnitus.   Eyes:   Negative for photophobia and pain.  Respiratory:  Negative for cough and shortness of breath.   Cardiovascular:  Negative for chest pain.  Gastrointestinal:  Negative for abdominal pain, nausea and vomiting.  Genitourinary:  Negative for dysuria, flank pain, frequency and urgency.  Musculoskeletal:  Positive for arthralgias (Right hip). Negative for back pain, myalgias and neck pain.  Neurological:  Negative for dizziness and headaches.  Hematological:  Does not bruise/bleed easily.  Psychiatric/Behavioral:  The patient is not nervous/anxious.    Blood pressure (!) 107/54, pulse 97, temperature 99.2 F (37.3 C), resp. rate 14, height '5\' 5"'$  (1.651 m), weight 106.1 kg, SpO2 95 %. Physical Exam Constitutional:      General: She is not in acute distress.    Appearance: She is well-developed. She is not diaphoretic.  HENT:     Head: Normocephalic and atraumatic.  Eyes:     General: No scleral icterus.       Right eye: No discharge.        Left eye: No discharge.     Conjunctiva/sclera: Conjunctivae normal.  Cardiovascular:     Rate and Rhythm: Normal rate and regular rhythm.  Pulmonary:     Effort: Pulmonary effort is normal. No respiratory distress.  Musculoskeletal:     Cervical back: Normal range of motion.     Comments: RLE No traumatic wounds, ecchymosis, or rash  Mild TTP hip  No knee or ankle effusion  Knee stable to varus/ valgus and anterior/posterior stress  Sens DPN, SPN, TN intact  Motor EHL, ext, flex, evers 5/5  DP 2+, PT 2+, No significant edema  Skin:    General: Skin is warm and dry.  Neurological:     Mental Status: She is alert.  Psychiatric:        Mood and Affect: Mood normal.        Behavior: Behavior normal.    Assessment/Plan: Right hip fx -- Plan IMN today with Dr. Marcelino Scot.    Lisette Abu, PA-C Orthopedic Surgery [phone] 11/26/2022, 1:47 PM

## 2022-11-26 NOTE — Op Note (Signed)
11/25/2022 - 11/26/2022  4:52 PM  PATIENT:  Terri Mayer  1951-10-20 female   MEDICAL RECORD NUMBER: [id number]  PRE-OPERATIVE DIAGNOSIS:  RIGHT COMMINUTED SUBTROCHANTERIC PROXIMAL FEMUR FRACTURE  POST-OPERATIVE DIAGNOSIS:  RIGHT COMMINUTED SUBTROCHANTERIC PROXIMAL FEMUR FRACTURE  PROCEDURE:  INTRAMEDULLARY NAILING OF THE RIGHT HIP using a [employer] Affixus nail 9 X 380 statically locked.  SURGEON:  Astrid Divine. Marcelino Scot, M.D.  ASSISTANT:  Ainsley Spinner, PA-C.  ANESTHESIA:  General.  COMPLICATIONS:  None.  ESTIMATED BLOOD LOSS:  Less than 150 mL.  DISPOSITION:  To PACU.  CONDITION:  Stable.  DELAY START OF DVT PROPHYLAXIS BECAUSE OF BLEEDING RISK: NO  BRIEF SUMMARY AND INDICATION OF PROCEDURE:  Terri Mayer is a 72 y.o. year-old who fell Sunday night in her shed but was not found until 22 hrs later and taken to the ED yesterday evening. I discussed with the patient the risks and benefits of surgical treatment including the potential for malunion, nonunion, symptomatic hardware, heart attack, stroke, neurovascular injury, bleeding, and others.  After full discussion, the patient provided consent to proceed.  BRIEF SUMMARY OF PROCEDURE:  The patient was taken to the operating room where general anesthesia was induced.  She was positioned supine on the Hana fracture table.  A closed reduction maneuver was performed of the fractured proximal femur and this was confirmed on both AP and lateral xray views. A thorough scrub and wash with chlorhexidine and then Betadine scrub and paint was performed.  After sterile drapes and time-out, a long instrument was used to identify the appropriate starting position under C-arm on both AP and lateral images.  A 3 cm incision was made proximal to the greater trochanter.  The curved cannulated awl was inserted just medial to the tip of the lateral trochanter and then the starting guidewire advanced into the proximal femur.  This was checked on AP and  lateral views.  The starting reamer was engaged with the soft tissue protected by a sleeve.  The curved ball-tipped guidewire was then inserted, making sure it was as posterior as possible in the distal femur and across the fracture site, which stayed in a reduced position.  It was sequentially reamed up to 11 and an 9 x 380 mm nail inserted to the appropriate depth. Through a small stab incision a ball spike pusher was used to reduce the lateral cortex by applying medially directed force. With pressure on both the lag screw guide and the antirotation screw guide, the guidewire for the lag screw was then inserted with the appropriate anteversion to make sure it was in a center-center position. A threaded pin for the antirotation screw was also temporarily placed. The lag screw placed with excellent purchase and position within the head and checked on both views.  The set screw was then engaged within the groove of the lag screw, which was allowed to telescope.  Traction was released and compression achieved with the screw.  This was followed by placement of one distal locking screw using perfect circle technique.  This was confirmed on AP and lateral images. Wounds were irrigated thoroughly, closed in a standard layered fashion. Sterile gently compressive dressings were applied.  Ainsley Spinner, PA-C, assisted throughout.  The patient was awakened from anesthesia and transported to the PACU in stable condition.  PROGNOSIS:  The patient will be weightbearing as tolerated with physical therapy beginning DVT prophylaxis with weight based Lovenox bridging to Eliquis.  She has no range of motion precautions.  We will continue  to follow through at the hospital.  Anticipate follow up in the office in 2 weeks for removal of sutures and further evaluation.     Astrid Divine. Marcelino Scot, M.D.

## 2022-11-26 NOTE — ED Notes (Signed)
Pt transported to short stay with Marcelino Scot MD.

## 2022-11-26 NOTE — Anesthesia Preprocedure Evaluation (Signed)
Anesthesia Evaluation    Reviewed: Allergy & Precautions, H&P , Patient's Chart, lab work & pertinent test results  Airway Mallampati: II  TM Distance: >3 FB Neck ROM: Full    Dental no notable dental hx. (+) Teeth Intact, Dental Advisory Given   Pulmonary neg pulmonary ROS   Pulmonary exam normal breath sounds clear to auscultation       Cardiovascular Exercise Tolerance: Good Normal cardiovascular exam(-) dysrhythmias  Rhythm:Regular Rate:Normal     Neuro/Psych negative neurological ROS  negative psych ROS   GI/Hepatic negative GI ROS, Neg liver ROS,,,  Endo/Other    Morbid obesity  Renal/GU negative Renal ROS  negative genitourinary   Musculoskeletal  (+) Arthritis , Osteoarthritis,    Abdominal   Peds  Hematology negative hematology ROS (+)   Anesthesia Other Findings   Reproductive/Obstetrics negative OB ROS                             Anesthesia Physical Anesthesia Plan  ASA: 3  Anesthesia Plan: General   Post-op Pain Management:    Induction: Intravenous  PONV Risk Score and Plan: 3 and Ondansetron, Dexamethasone and Treatment may vary due to age or medical condition  Airway Management Planned: Oral ETT  Additional Equipment: None  Intra-op Plan:   Post-operative Plan: Extubation in OR  Informed Consent: I have reviewed the patients History and Physical, chart, labs and discussed the procedure including the risks, benefits and alternatives for the proposed anesthesia with the patient or authorized representative who has indicated his/her understanding and acceptance.       Plan Discussed with: Anesthesiologist  Anesthesia Plan Comments:         Anesthesia Quick Evaluation

## 2022-11-26 NOTE — TOC CAGE-AID Note (Signed)
Transition of Care [facility]) - CAGE-AID Screening   Patient Details  Name: Terri Mayer MRN: [id number] Date of Birth: September 05, 1951  Transition of Care [facility]) CM/SW Contact:    Army Melia, RN Phone Number:[phone] 11/26/2022, 11:40 PM   Clinical Narrative:  No hx of drug/alcohol use, no resources indicated.  CAGE-AID Screening:    Have You Ever Felt You Ought to Cut Down on Your Drinking or Drug Use?: No Have People Annoyed You By Critizing Your Drinking Or Drug Use?: No Have You Felt Bad Or Guilty About Your Drinking Or Drug Use?: No Have You Ever Had a Drink or Used Drugs First Thing In The Morning to Steady Your Nerves or to Get Rid of a Hangover?: No CAGE-AID Score: 0  Substance Abuse Education Offered: No

## 2022-11-26 NOTE — Progress Notes (Signed)
OT Cancellation Note  Patient Details Name: Terri Mayer MRN: [id number] DOB: 02-Jul-1951   Cancelled Treatment:    Reason Eval/Treat Not Completed: Patient not medically ready. Pt to go to OR today for surgical repair with Dr Marcelino Scot. OT will continue to follow post-op for OT evaluation.   [place] 11/26/2022, 8:45 AM  [place] Office: [phone]

## 2022-11-27 DIAGNOSIS — S72141A Displaced intertrochanteric fracture of right femur, initial encounter for closed fracture: Secondary | ICD-10-CM | POA: Diagnosis not present

## 2022-11-27 LAB — COMPREHENSIVE METABOLIC PANEL
ALT: 24 U/L (ref 0–44)
AST: 49 U/L — ABNORMAL HIGH (ref 15–41)
Albumin: 2.9 g/dL — ABNORMAL LOW (ref 3.5–5.0)
Alkaline Phosphatase: 48 U/L (ref 38–126)
Anion gap: 9 (ref 5–15)
BUN: 23 mg/dL (ref 8–23)
CO2: 25 mmol/L (ref 22–32)
Calcium: 8.6 mg/dL — ABNORMAL LOW (ref 8.9–10.3)
Chloride: 102 mmol/L (ref 98–111)
Creatinine, Ser: 0.97 mg/dL (ref 0.44–1.00)
GFR, Estimated: >60 mL/min (ref >60)
Glucose, Bld: 152 mg/dL — ABNORMAL HIGH (ref 70–99)
Potassium: 4.3 mmol/L (ref 3.5–5.1)
Sodium: 136 mmol/L (ref 135–145)
Total Bilirubin: 0.9 mg/dL (ref 0.3–1.2)
Total Protein: 5.5 g/dL — ABNORMAL LOW (ref 6.5–8.1)

## 2022-11-27 LAB — CK: Total CK: 440 U/L — ABNORMAL HIGH (ref 38–234)

## 2022-11-27 LAB — CBC
HCT: 37.6 % (ref 36.0–46.0)
Hemoglobin: 12 g/dL (ref 12.0–15.0)
MCH: 27.1 pg (ref 26.0–34.0)
MCHC: 31.9 g/dL (ref 30.0–36.0)
MCV: 85.1 fL (ref 80.0–100.0)
Platelets: 244 10*3/uL (ref 150–400)
RBC: 4.42 MIL/uL (ref 3.87–5.11)
RDW: 14.2 % (ref 11.5–15.5)
WBC: 11.5 10*3/uL — ABNORMAL HIGH (ref 4.0–10.5)
nRBC: 0 % (ref 0.0–0.2)

## 2022-11-27 LAB — LACTIC ACID, PLASMA: Lactic Acid, Venous: 1.6 mmol/L (ref 0.5–1.9)

## 2022-11-27 LAB — MAGNESIUM: Magnesium: 1.9 mg/dL (ref 1.7–2.4)

## 2022-11-27 MED ORDER — ENSURE ENLIVE PO LIQD
237.0000 mL | Freq: Two times a day (BID) | ORAL | Status: DC
  Administered 2022-11-27 – 2022-11-29 (×5): 237 mL via ORAL

## 2022-11-27 MED ORDER — APIXABAN 2.5 MG PO TABS
2.5000 mg | ORAL_TABLET | Freq: Two times a day (BID) | ORAL | Status: DC
  Administered 2022-11-28 – 2022-11-29 (×3): 2.5 mg via ORAL
  Filled 2022-11-27 (×3): qty 1

## 2022-11-27 NOTE — Progress Notes (Signed)
Orthopaedic Trauma Service Progress Note  Patient ID: Terri Mayer MRN: [id number] DOB/AGE: October 09, 1951 72 y.o.  Subjective:  Doing great  Pain feels much better than pre-op Sitting in chair  Lives alone in ranch style house  On waitlist at [facility] for independent living [place]   Not opposed to snf but would like to go home if at all possible  Has a sister that lives in [place] that said she could come and help  ROS As above  Objective:   VITALS:   Vitals:   11/26/22 2127 11/27/22 0031 11/27/22 0424 11/27/22 0824  BP: 121/75 (!) 107/53 (!) 108/52   Pulse: (!) 126 (!) 113 84   Resp:  13 13   Temp: 97.8 F (36.6 C) (!) 97 F (36.1 C) 97.8 F (36.6 C) 98 F (36.7 C)  TempSrc: Oral Oral Oral Oral  SpO2: 96% 93% 96%   Weight:      Height:        Estimated body mass index is 38.94 kg/m as calculated from the following:   Height as of this encounter: 5' 5"$  (1.651 m).   Weight as of this encounter: 106.1 kg.   Intake/Output      01/09 0701 01/10 0700 01/10 0701 01/11 0700   I.V. (mL/kg) 952.7 (9)    Total Intake(mL/kg) 952.7 (9)    Urine (mL/kg/hr) 750 (0.3)    Total Output 750    Net +202.7           LABS  Results for orders placed or performed during the hospital encounter of 11/25/22 (from the past 24 hour(s))  Surgical pcr screen     Status: None   Collection Time: 11/26/22  4:20 PM   Specimen: Nasal Mucosa; Nasal Swab  Result Value Ref Range   MRSA, PCR NEGATIVE NEGATIVE   Staphylococcus aureus NEGATIVE NEGATIVE  Procalcitonin - Baseline     Status: None   Collection Time: 11/26/22  9:37 PM  Result Value Ref Range   Procalcitonin 0.11 ng/mL  CBC     Status: Abnormal   Collection Time: 11/26/22  9:37 PM  Result Value Ref Range   WBC 14.0 (H) 4.0 - 10.5 K/uL   RBC 4.33 3.87 - 5.11 MIL/uL   Hemoglobin 11.7 (L) 12.0 - 15.0 g/dL   HCT 36.6 36.0 - 46.0 %   MCV 84.5 80.0 -  100.0 fL   MCH 27.0 26.0 - 34.0 pg   MCHC 32.0 30.0 - 36.0 g/dL   RDW 14.2 11.5 - 15.5 %   Platelets 215 150 - 400 K/uL   nRBC 0.0 0.0 - 0.2 %  Creatinine, serum     Status: None   Collection Time: 11/26/22  9:37 PM  Result Value Ref Range   Creatinine, Ser 0.97 0.44 - 1.00 mg/dL   GFR, Estimated >60 >60 mL/min  CBC     Status: Abnormal   Collection Time: 11/27/22  3:07 AM  Result Value Ref Range   WBC 11.5 (H) 4.0 - 10.5 K/uL   RBC 4.42 3.87 - 5.11 MIL/uL   Hemoglobin 12.0 12.0 - 15.0 g/dL   HCT 37.6 36.0 - 46.0 %   MCV 85.1 80.0 - 100.0 fL   MCH 27.1 26.0 - 34.0 pg   MCHC 31.9 30.0 - 36.0 g/dL  RDW 14.2 11.5 - 15.5 %   Platelets 244 150 - 400 K/uL   nRBC 0.0 0.0 - 0.2 %  Comprehensive metabolic panel     Status: Abnormal   Collection Time: 11/27/22  3:07 AM  Result Value Ref Range   Sodium 136 135 - 145 mmol/L   Potassium 4.3 3.5 - 5.1 mmol/L   Chloride 102 98 - 111 mmol/L   CO2 25 22 - 32 mmol/L   Glucose, Bld 152 (H) 70 - 99 mg/dL   BUN 23 8 - 23 mg/dL   Creatinine, Ser 0.97 0.44 - 1.00 mg/dL   Calcium 8.6 (L) 8.9 - 10.3 mg/dL   Total Protein 5.5 (L) 6.5 - 8.1 g/dL   Albumin 2.9 (L) 3.5 - 5.0 g/dL   AST 49 (H) 15 - 41 U/L   ALT 24 0 - 44 U/L   Alkaline Phosphatase 48 38 - 126 U/L   Total Bilirubin 0.9 0.3 - 1.2 mg/dL   GFR, Estimated >60 >60 mL/min   Anion gap 9 5 - 15  Magnesium     Status: None   Collection Time: 11/27/22  3:07 AM  Result Value Ref Range   Magnesium 1.9 1.7 - 2.4 mg/dL  CK     Status: Abnormal   Collection Time: 11/27/22  3:07 AM  Result Value Ref Range   Total CK 440 (H) 38 - 234 U/L  Lactic acid, plasma     Status: None   Collection Time: 11/27/22  3:07 AM  Result Value Ref Range   Lactic Acid, Venous 1.6 0.5 - 1.9 mmol/L     PHYSICAL EXAM:   Gen: sitting up in chair, NAD, looks great  Lungs: unlabored Ext:       Right Lower Extremity   Dressings right hip and thigh are stable  Minimal swelling   DPN, SPN, TN sensation  intact  EHL, FHL, lesser toe motor intact  Ankle fleixon, extension, inversion and eversion intact  No DCT  Compartments are soft  + DP pulse     Assessment/Plan: 1 Day Post-Op     Anti-infectives (From admission, onward)    Start     Dose/Rate Route Frequency Ordered Stop   11/27/22 0600  ceFAZolin (ANCEF) IVPB 2g/100 mL premix        2 g 200 mL/hr over 30 Minutes Intravenous On call to O.R. 11/26/22 1556 11/26/22 1705   11/26/22 2300  ceFAZolin (ANCEF) IVPB 2g/100 mL premix        2 g 200 mL/hr over 30 Minutes Intravenous Every 6 hours 11/26/22 2116 11/27/22 0650   11/26/22 1558  ceFAZolin (ANCEF) 2-4 GM/100ML-% IVPB       Note to Pharmacy: Brandt Loosen, GRETA: cabinet override      11/26/22 1558 11/26/22 1714     .  POD/HD#: 1  72 y/o female s/p fall with comminuted R proximal femur fracutre   -closed comminuted R proximal femur fracture  Weightbearing WBAT R leg with walker    ROM/Activity   Unrestricted ROM R hip and knee   Activity as tolerated   Wound care   Daily wound care starting 11/28/2022       PT/OT evals      Ice and elevate for swelling and pain control   - Pain management:  Multimodal   Minimize narcotics   - ABL anemia/Hemodynamics  Stable  - Medical issues   Per primary  - DVT/PE prophylaxis:  Lovenox currently   Start eliquis tomorrow  -  ID:   Periop abx   - Metabolic Bone Disease:  Vitamin d deficiency    Supplement   Fracture mechanism was a bit unusual. It was not just a simple fall but would still consider a fragility fracture  Recommend outpatient follow up with osteoporosis clinic   - Activity:  WBAT R leg   - Impediments to fracture healing:  Vitamin d deficiency   Fragility fracture  - Dispo:  Ortho issues stable  Therapy evals  Home with HHPT vs CIR vs SNF      [name], [name] 11/27/2022, 9:28 AM  [facility] [place] [address] [phone]  [name]    After 5pm and on the weekends please log on to Amion, go to orthopaedics and the look under the [facility] Call for the provider(s) on call. You can also call our office at [phone] and then follow the prompts to be connected to the call team.  Patient ID: Terri Mayer, female   DOB: 1951/11/09, 72 y.o.   MRN: [id number]

## 2022-11-27 NOTE — Progress Notes (Signed)
? ?  Inpatient Rehab Admissions Coordinator : ? ?Per therapy recommendations, patient was screened for CIR candidacy by Saumya Hukill RN MSN.  At this time patient appears to be a potential candidate for CIR. I will place a rehab consult per protocol for full assessment. Please call me with any questions. ? ?[place] RN MSN ?Admissions Coordinator ?[phone] ?  ?

## 2022-11-27 NOTE — Evaluation (Signed)
Occupational Therapy Evaluation Patient Details Name: Terri Mayer MRN: [id number] DOB: [birth year] Today's Date: 11/27/2022   History of Present Illness s/p INTRAMEDULLARY NAILING OF THE RIGHT HIP completed on 11/26/21 due to femur fracture sustained from mechanical fall in storage building at home.   Clinical Impression   Pt in bed upon therapy arrival and agreeable to participate in skilled OT evaluation. Pt reports that prior to admit she was independent with all ADL tasks and functional mobility. She did lightly use walls/furniture when walking in the home although was not dependent for balance. Currently, pt is presenting with increased pain, decreased activity tolerance, endurance, and strength requiring increased physical assistance to complete BADL tasks and functional transfers. Pt is extremely motivated to progress in therapy. Recommending CIR at discharge to focus on mentioned deficits and to allow pt to increase functional performance when completing ADL tasks and functional mobility. Pt will have Sister as support to assist her at home. Acute OT will continue to follow patient.       Recommendations for follow up therapy are one component of a multi-disciplinary discharge planning process, led by the attending physician.  Recommendations may be updated based on patient status, additional functional criteria and insurance authorization.   Follow Up Recommendations  CIR     Assistance Recommended at Discharge Intermittent Supervision/Assistance  Patient can return home with the following A lot of help with walking and/or transfers;A lot of help with bathing/dressing/bathroom;Assistance with cooking/housework;Help with stairs or ramp for entrance;Assist for transportation    Functional Status Assessment  Patient has had a recent decline in their functional status and demonstrates the ability to make significant improvements in function in a reasonable and predictable amount of  time.  Equipment Recommendations  Other (comment) (defer to next venue)       Precautions / Restrictions Precautions Precautions: Fall Restrictions Weight Bearing Restrictions: Yes RLE Weight Bearing: Weight bearing as tolerated      Mobility Bed Mobility Overal bed mobility: Needs Assistance Bed Mobility: Supine to Sit     Supine to sit: Max assist, HOB elevated     General bed mobility comments: Preferred to scoot towards footboard and crunch up to sit. VC and physical assist provided for hand placement to utilize bed railing. Pain during bed mobility and weightbearing on RLE. Patient Response: Cooperative  Transfers Overall transfer level: Needs assistance Equipment used: Rolling walker (2 wheels) Transfers: Sit to/from Stand, Bed to chair/wheelchair/BSC Sit to Stand: Max assist, From elevated surface Stand pivot transfers: Min assist         General transfer comment: Increased time required to achieve sit to stand transition. VC were provided for proper hand placement on RW for safety and technique. Max VC provided during sit to stand transition to push with BUE into RW handles and squeeze glutes to bring upper body upright. Unable to step pvt although able to heel toe feet while using RW.      Balance Overall balance assessment: Needs assistance Sitting-balance support: Bilateral upper extremity supported, Feet supported Sitting balance-Leahy Scale: Good Sitting balance - Comments: sitting EOB. Able to weight shift onto left hip to remove bedding from under buttocks.     Standing balance-Leahy Scale: Poor Standing balance comment: Relied heavily on BUE with RW to maintain balance.           ADL either performed or assessed with clinical judgement   ADL Overall ADL's : Needs assistance/impaired     Grooming: Wash/dry face;Oral care;Set up;Sitting  Upper Body Bathing: Set up;Sitting   Lower Body Bathing: Total assistance;Bed level   Upper Body  Dressing : Minimal assistance;Sitting   Lower Body Dressing: Total assistance;Bed level   Toilet Transfer: Maximal assistance;BSC/3in1;Rolling walker (2 wheels);Stand-pivot;Cueing for sequencing;Cueing for safety   Toileting- Clothing Manipulation and Hygiene: Total assistance;Bed level               Vision Baseline Vision/History: 1 Wears glasses (for reading) Ability to See in Adequate Light: 0 Adequate Patient Visual Report: No change from baseline Vision Assessment?: No apparent visual deficits            Pertinent Vitals/Pain Pain Assessment Pain Assessment: Faces (no pain at rest) Faces Pain Scale: Hurts whole lot Pain Location: right hip with movement, bed mobility, and weightbearing Pain Descriptors / Indicators: Grimacing, Guarding Pain Intervention(s): Monitored during session, Patient requesting pain meds-RN notified, Repositioned     Hand Dominance Right   Extremity/Trunk Assessment Upper Extremity Assessment Upper Extremity Assessment: Generalized weakness   Lower Extremity Assessment Lower Extremity Assessment: Defer to PT evaluation       Communication Communication Communication: No difficulties   Cognition Arousal/Alertness: Awake/alert Behavior During Therapy: WFL for tasks assessed/performed Overall Cognitive Status: Within Functional Limits for tasks assessed             General Comments  IV site on LUE elbow bleeding. Nursing made aware. VSS. SpO2 remained in normal range on room air.            Home Living Family/patient expects to be discharged to:: Skilled nursing facility Living Arrangements: Alone Available Help at Discharge: Other (Comment) (Sister may be able to come stay with pt for a little bit at discharge. Pt also may hire help. Son lives near by and could help with meals after he gets out of work.) Type of Home: House Home Access: Stairs to enter [employer] of Steps: from carport: 1 small step down then up  1 small step then 1 full step into the home. 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          OT Problem List: Decreased strength;Decreased safety awareness;Decreased activity tolerance;Decreased knowledge of use of DME or AE;Impaired balance (sitting and/or standing);Pain      OT Treatment/Interventions: Self-care/ADL training;Therapeutic activities;Therapeutic exercise;Neuromuscular education;Energy conservation;Patient/family education;Balance training;DME and/or AE instruction;Manual therapy;Modalities    OT Goals(Current goals can be found in the care plan section) Acute Rehab OT Goals Patient Stated Goal: to get up OT Goal Formulation: With patient Time For Goal Achievement: 12/11/22 Potential to Achieve Goals: Good  OT Frequency: Min 2X/week       AM-PAC OT "6 Clicks" Daily Activity     Outcome Measure Help from another person eating meals?: None Help from another person taking care of personal grooming?: None Help from another person toileting, which includes using toliet, bedpan, or urinal?: Total Help from another person bathing (including washing, rinsing, drying)?: A Lot  Help from another person to put on and taking off regular upper body clothing?: A Little Help from another person to put on and taking off regular lower body clothing?: Total 6 Click Score: 15   End of Session  Equipment Utilized During Treatment: Gait belt;Rolling walker (2 wheels) Nurse Communication: Mobility status;Patient requests pain meds;Weight bearing status  Activity Tolerance: Patient tolerated treatment well;Patient limited by pain Patient left: in chair;with call bell/phone within reach;with chair alarm set;with nursing/sitter in room  OT Visit Diagnosis: Unsteadiness on feet (R26.81);Muscle weakness (generalized) (M62.81);History of falling (Z91.81)                Time: 7001-7494 OT Time Calculation (min): 60 min Charges:  OT General Charges $OT Visit: 1 Visit OT Evaluation $OT Eval High Complexity: 1 High OT Treatments $Self Care/Home Management : 38-52 mins  Ailene Ravel, OTR/L,CBIS  Supplemental OT - [facility] and [facility] Secure Chat Preferred    Arianna Haydon, Clarene Duke 11/27/2022, 11:17 AM

## 2022-11-27 NOTE — Progress Notes (Signed)
Initial Nutrition Assessment  DOCUMENTATION CODES:   Obesity unspecified  INTERVENTION:  Continue current regular diet as ordered Ensure Enlive po BID, each supplement provides 350 kcal and 20 grams of protein.  NUTRITION DIAGNOSIS:   Increased nutrient needs related to hip fracture as evidenced by estimated needs.  GOAL:   Patient will meet greater than or equal to 90% of their needs   MONITOR:   PO intake, Supplement acceptance, Labs, Weight trends  REASON FOR ASSESSMENT:   Consult Hip fracture protocol  ASSESSMENT:   Pt admitted after a fall leading to R hip fracture. PMH significant for BRCA in remission.  01/09: s/p IMN  Attempted to speak with pt x2. With other physicians and care providers at time of visit. Unable to obtain detailed nutrition related history at this time. No documented meal completions on file to review during admission.   There is limited documentation of weight history on file to review within the last year. Noted a weight of 105.6 kg on 09/23/22. Current admit weight noted to be 106.1 kg.   Pt would likely benefit from the addition of nutrition supplements given increased nutrition needs d/t hip fracture and post op healing. Will order and adjust as appropriate.  Medications: Vitamin C, Vitamin D, colace  Labs reviewed  NUTRITION - FOCUSED PHYSICAL EXAM: Deferred to follow up.   Diet Order:   Diet Order             Diet regular Room service appropriate? Yes; Fluid consistency: Thin  Diet effective now                    EDUCATION NEEDS:   No education needs have been identified at this time  Skin:  Skin Assessment: Reviewed RN Assessment (R hip closed incision)  Last BM:  1/7  Height:   Ht Readings from Last 1 Encounters:  11/25/22 '5\' 5"'$  (1.651 m)    Weight:   Wt Readings from Last 1 Encounters:  11/25/22 106.1 kg    Ideal Body Weight:  56.8 kg  BMI:  Body mass index is 38.94 kg/m.  Estimated Nutritional Needs:   Kcal:  1500-1700  Protein:  75-90g  Fluid:  >/=1.5L  Clayborne Dana, RDN, LDN Clinical Nutrition

## 2022-11-27 NOTE — TOC Initial Note (Addendum)
Transition of Care [facility]) - Initial/Assessment Note    Patient Details  Name: Terri Mayer MRN: [id number] Date of Birth: May 25, 1951  Transition of Care [facility]) CM/SW Contact:    Terri Mons, RN Phone Number: 11/27/2022, 7:42 AM  Clinical Narrative:                  -s/p INTRAMEDULLARY NAILING OF THE RIGHT HIP,1/9  NCM @ bedside to discuss d/c planning. Pt states lives alone. Sister lives out of town. States hoping to transition to CIR  @ discharge. States sister will provide assistance and care once d/c. States PTA independent with ADL's. Owns cane and toilet riser. Pt without transportation issues and has no concerns affording RX meds co-pay.  PT/OT evaluation pending ....  Medicare SNF star rating list provided to pt with potential transition to SNF/REHAB as a backup if inpatient rehab not an option.  TOC team will continue to monitor and assist with needs.  Expected Discharge Plan: [place] (vs SNF) Barriers to Discharge: Continued Medical Work up   Patient Goals and CMS Choice            Expected Discharge Plan and Services     Post Acute Care Choice: [place] (pt states would like rehab before transitioning to home) Living arrangements for the past 2 months: Single Family Home                                      Prior Living Arrangements/Services Living arrangements for the past 2 months: Single Family Home Lives with:: Self Patient language and need for interpreter reviewed:: Yes Do you feel safe going back to the place where you live?: Yes      Need for Family Participation in Patient Care: Yes (Comment) Care giver support system in place?: No (comment) Current home services: DME (Toilet riser and cane) Criminal Activity/Legal Involvement Pertinent to Current Situation/Hospitalization: No - Comment as needed  Activities of Daily Living Home Assistive Devices/Equipment: Eyeglasses ADL Screening  (condition at time of admission) Patient's cognitive ability adequate to safely complete daily activities?: Yes Is the patient deaf or have difficulty hearing?: No Does the patient have difficulty seeing, even when wearing glasses/contacts?: No Does the patient have difficulty concentrating, remembering, or making decisions?: No Patient able to express need for assistance with ADLs?: Yes Does the patient have difficulty dressing or bathing?: No Independently performs ADLs?: Yes (appropriate for developmental age) Does the patient have difficulty walking or climbing stairs?: No Weakness of Legs: None Weakness of Arms/Hands: None  Permission Sought/Granted                  Emotional Assessment Appearance:: Appears stated age Attitude/Demeanor/Rapport: Engaged Affect (typically observed): Accepting Orientation: : Oriented to Self, Oriented to Place, Oriented to  Time, Oriented to Situation Alcohol / Substance Use: Not Applicable Psych Involvement: No (comment)  Admission diagnosis:  Hip fracture ([place]) [S72.009A] Fall, initial encounter [W19.XXXA] Closed fracture of right hip, initial encounter ([place]) [S72.001A] Displaced intertrochanteric fracture of right femur, initial encounter for closed fracture [facility]) [S72.141A] Patient Active Problem List   Diagnosis Date Noted   Displaced intertrochanteric fracture of right femur, initial encounter for closed fracture ([place]) 11/26/2022   Contact dermatitis due to plant 10/01/2019   Routine general medical examination at a health care facility 01/06/2017   Malignant neoplasm of upper-inner quadrant of right breast  in female, estrogen receptor negative ([place]) 11/30/2015   Intermittent palpitations 11/30/2015   Morbid obesity ([place]) 11/30/2015   PCP:  Terri Koch, MD Pharmacy:   [facility] [address] [place], [place]. AT [address]. [place]  [address] Phone: [phone] Fax: [phone]     Social Determinants of Health (SDOH) Social History: SDOH Screenings   Food Insecurity: No Food Insecurity (11/27/2022)  Housing: Low Risk  (11/27/2022)  Transportation Needs: No Transportation Needs (11/27/2022)  Utilities: Not At Risk (11/27/2022)  Depression (PHQ2-9): Low Risk  (09/09/2019)  Tobacco Use: Low Risk  (11/26/2022)   SDOH Interventions:     Readmission Risk Interventions     No data to display

## 2022-11-27 NOTE — Progress Notes (Signed)
  Inpatient Rehabilitation Admissions Coordinator   Met with patient at bedside for rehab assessment and spoke with her sister in law, Marquis Lunch, by phone per patient request on conference call with patient.  Sister in law has [place] background and asked very appropriate questions concerning venue options and goals. We discussed goals and expectations of a possible CIR admit. They prefer CIR for rehab for more extensive rehab that it can provide. Family would arrange 24/7 assist if needed after a CIR admit.  I will begin insurance Auth with [employer] for possible CIR admit pending approval. Please call me with any questions.   Danne Baxter, RN, MSN Rehab Admissions Coordinator [phone]

## 2022-11-27 NOTE — Evaluation (Signed)
Physical Therapy Evaluation  Patient Details Name: Terri Mayer MRN: [id number] DOB: 03/14/1951 Today's Date: 11/27/2022  History of Present Illness  Pt is a 72 y/o female who presents s/p fall at home, sustaining a R femur fracture. She is now s/p R IM nail of the R hip on 11/26/2022. PMH significant for breast CA, OA.   Clinical Impression  Pt admitted with above diagnosis. Pt currently with functional limitations due to the deficits listed below (see PT Problem List). At the time of PT eval pt was able to perform transfers with gross +2 assist and RW for support. Pain limiting mobility at this time, but anticipate pt will progress well as pain improves. Feel this patient is a good candidate for inpatient rehab, as she is motivated to participate and return to PLOF. The increased intensity of multidisciplinary rehab at [facility] would be beneficial to maximize functional independence, safety, and decrease risk for fall upon return home with family support. Acutely, pt will benefit from skilled PT to increase their independence and safety with mobility to allow discharge to the venue listed below.          Recommendations for follow up therapy are one component of a multi-disciplinary discharge planning process, led by the attending physician.  Recommendations may be updated based on patient status, additional functional criteria and insurance authorization.  Follow Up Recommendations Acute inpatient rehab (3hours/day)      Assistance Recommended at Discharge Frequent or constant Supervision/Assistance  Patient can return home with the following  A lot of help with walking and/or transfers;A lot of help with bathing/dressing/bathroom;Assistance with cooking/housework;Assist for transportation;Help with stairs or ramp for entrance    Equipment Recommendations Rolling walker (2 wheels)  Recommendations for Other Services       Functional Status Assessment Patient has had a recent decline in  their functional status and demonstrates the ability to make significant improvements in function in a reasonable and predictable amount of time.     Precautions / Restrictions Precautions Precautions: Fall Restrictions Weight Bearing Restrictions: Yes RLE Weight Bearing: Weight bearing as tolerated      Mobility  Bed Mobility               General bed mobility comments: Pt was received sitting up in the recliner.    Transfers Overall transfer level: Needs assistance Equipment used: Rolling walker (2 wheels) Transfers: Sit to/from Stand, Bed to chair/wheelchair/BSC Sit to Stand: +2 physical assistance, Max assist, Mod assist Stand pivot transfers: Min assist, +2 physical assistance         General transfer comment: +2 assist for power up to full stand with RW for support. Pt required heavier assist from recliner than from elevated BSC. Pt scooting R foot (heel/toe) to advance with difficulty clearing floor for transfer to [facility]. Able to clear floor x2 with attempts at taking steps forwards after up from [facility].    Ambulation/Gait               General Gait Details: Unable to progress to ambulation at this time.  Stairs            Wheelchair Mobility    Modified Rankin (Stroke Patients Only)       Balance Overall balance assessment: Needs assistance Sitting-balance support: Bilateral upper extremity supported, Feet supported Sitting balance-Leahy Scale: Good Sitting balance - Comments: sitting EOB. Able to weight shift onto left hip to remove bedding from under buttocks.     Standing balance-Leahy Scale: Poor  Standing balance comment: Relied heavily on BUE with RW to maintain balance.                             Pertinent Vitals/Pain Pain Assessment Pain Assessment: Faces Faces Pain Scale: Hurts whole lot Pain Location: right hip with movement, bed mobility, and weightbearing Pain Descriptors / Indicators: Grimacing, Moaning, Operative  site guarding, Sore Pain Intervention(s): Limited activity within patient's tolerance, Monitored during session, Repositioned    Home Living Family/patient expects to be discharged to:: Inpatient rehab Living Arrangements: Alone Available Help at Discharge: Family;Available 24 hours/day (Pt reports sister and son could be available at d/c) Type of Home: House Home Access: Stairs to enter Entrance Stairs-Rails: None Entrance Stairs-Number of Steps: from carport: 1 small step down then up 1 small step then 1 full step into the home.   Home Layout: One level Home Equipment: Shower seat - built in;Shower seat;Toilet riser;Cane - single point;Other (comment) (walking sticks)      Prior Function Prior Level of Function : Independent/Modified Independent             Mobility Comments: Pt reports that she typically lightly furniture/wall walks due to neuropathy in BLE due to cancer medication, Reports BLE knee arthritis.       Hand Dominance   Dominant Hand: Right    Extremity/Trunk Assessment   Upper Extremity Assessment Upper Extremity Assessment: Defer to OT evaluation    Lower Extremity Assessment Lower Extremity Assessment: RLE deficits/detail RLE Deficits / Details: Acute pain, decreased strength and AROM consistent with pre-op diagnosis and subsequent surgery.    Cervical / Trunk Assessment Cervical / Trunk Assessment: Normal  Communication   Communication: No difficulties  Cognition Arousal/Alertness: Awake/alert Behavior During Therapy: WFL for tasks assessed/performed Overall Cognitive Status: Within Functional Limits for tasks assessed                                          General Comments General comments (skin integrity, edema, etc.): IV site on LUE elbow bleeding. Nursing made aware. VSS. SpO2 remained in normal range on room air.    Exercises     Assessment/Plan    PT Assessment Patient needs continued PT services  PT Problem  List Decreased strength;Decreased range of motion;Decreased activity tolerance;Decreased balance;Decreased mobility;Decreased knowledge of use of DME;Decreased safety awareness;Decreased knowledge of precautions;Pain       PT Treatment Interventions DME instruction;Gait training;Functional mobility training;Stair training;Therapeutic activities;Therapeutic exercise;Balance training;Patient/family education    PT Goals (Current goals can be found in the Care Plan section)  Acute Rehab PT Goals Patient Stated Goal: Go to inpatient rehab PT Goal Formulation: With patient Time For Goal Achievement: 12/11/22 Potential to Achieve Goals: Good    Frequency Min 4X/week     Co-evaluation               AM-PAC PT "6 Clicks" Mobility  Outcome Measure Help needed turning from your back to your side while in a flat bed without using bedrails?: A Lot Help needed moving from lying on your back to sitting on the side of a flat bed without using bedrails?: A Lot Help needed moving to and from a bed to a chair (including a wheelchair)?: Total Help needed standing up from a chair using your arms (e.g., wheelchair or bedside chair)?: Total Help needed to walk in  hospital room?: Total Help needed climbing 3-5 steps with a railing? : Total 6 Click Score: 8    End of Session Equipment Utilized During Treatment: Gait belt Activity Tolerance: Patient tolerated treatment well Patient left: in chair;with call bell/phone within reach;with chair alarm set Nurse Communication: Mobility status PT Visit Diagnosis: Unsteadiness on feet (R26.81);Pain Pain - Right/Left: Right Pain - part of body: Hip    Time: 1207-1242 PT Time Calculation (min) (ACUTE ONLY): 35 min   Charges:   PT Evaluation $PT Eval Moderate Complexity: 1 Mod PT Treatments $Gait Training: 8-22 mins        Rolinda Roan, PT, DPT Acute Rehabilitation Services Secure Chat Preferred Office: [phone]   [name] 11/27/2022, 1:51 PM

## 2022-11-27 NOTE — Anesthesia Postprocedure Evaluation (Signed)
Anesthesia Post Note  Patient: Terri Mayer  Procedure(s) Performed: INTRAMEDULLARY (IM) NAIL INTERTROCHANTERIC (Right)     Patient location during evaluation: PACU Anesthesia Type: General Level of consciousness: sedated and patient cooperative Pain management: pain level controlled Vital Signs Assessment: post-procedure vital signs reviewed and stable Respiratory status: spontaneous breathing Cardiovascular status: stable Anesthetic complications: no   No notable events documented.  Last Vitals:  Vitals:   11/27/22 1446 11/27/22 1945  BP: (!) 104/36 (!) 104/53  Pulse: 87 98  Resp: 17 18  Temp: 36.9 C 36.8 C  SpO2: 96% 95%    Last Pain:  Vitals:   11/27/22 1945  TempSrc: Oral  PainSc:                  Nolon Nations

## 2022-11-27 NOTE — Progress Notes (Signed)
PROGRESS NOTE    Terri Mayer  MWN:[id number] DOB: [birth year] DOA: 11/25/2022 PCP: [name], MD    Brief Narrative:   Terri Mayer is a 72 y.o. female with past medical history significant for history of breast cancer who presented to [facility] ED on 1/8 following mechanical fall at home approximately at 1845.  Patient immediately complained of pain to right lower extremity and inability to ambulate.  Patient apparently was down for roughly 22 hours before she was found by her neighbor and EMS was activated and patient was transported to ED for further evaluation.  Denies loss of consciousness.  Right femur/pelvis with acute comminuted displaced right intratrochanteric fracture.  Orthopedics was consulted.  TRH consulted for admission for further evaluation management of right hip fracture.  Assessment & Plan:   Right intertrochanteric hip fracture Patient presenting via EMS after being found down at home following mechanical fall by neighbor.  Patient reports she tripped while putting her Christmas decorations away with immediate pain to her right hip and inability ambulate.  Imaging on arrival notable for acute comminuted displaced right intertrochanteric fracture. --s/p INTRAMEDULLARY NAILING OF THE RIGHT HIP  -PT/OT evals pending  Morbid obesity Body mass index is 38.94 kg/m.  Discussed with patient needs for aggressive lifestyle changes/weight loss as this complicates all facets of care.  Outpatient follow-up with PCP.     BRCA -in remission    DVT prophylaxis: enoxaparin (LOVENOX) injection 40 mg Start: 11/27/22 1000 SCDs Start: 11/26/22 2117 SCDs Start: 11/25/22 2132    Code Status: Full Code Family Communication: No family present at bedside this morning  Disposition Plan:  Level of care: Med-Surg Status is: Inpatient Remains inpatient appropriate because: PT/OT eval    Consultants:  Orthopedics, Dr. Marcelino Scot    Subjective: Asking about different  rehabs On waiting list at [place] for an apartment   Objective: Vitals:   11/26/22 2127 11/27/22 0031 11/27/22 0424 11/27/22 0824  BP: 121/75 (!) 107/53 (!) 108/52   Pulse: (!) 126 (!) 113 84   Resp:  13 13   Temp: 97.8 F (36.6 C) (!) 97 F (36.1 C) 97.8 F (36.6 C) 98 F (36.7 C)  TempSrc: Oral Oral Oral Oral  SpO2: 96% 93% 96%   Weight:      Height:        Intake/Output Summary (Last 24 hours) at 11/27/2022 1032 Last data filed at 11/27/2022 4034 Gross per 24 hour  Intake 952.72 ml  Output 750 ml  Net 202.72 ml   Filed Weights   11/25/22 1730  Weight: 106.1 kg    Examination:  Physical Exam:  General: Appearance:    Obese female in no acute distress     Lungs:     respirations unlabored  Heart:    Normal heart rate. Normal rhythm. No murmurs, rubs, or gallops.   MS:   All extremities are intact.   Neurologic:   Awake, alert       Data Reviewed: I have personally reviewed following labs and imaging studies  CBC: Recent Labs  Lab 11/25/22 1751 11/26/22 0019 11/26/22 0452 11/26/22 2137 11/27/22 0307  WBC 23.2*  --  14.2* 14.0* 11.5*  HGB 15.5* 13.1 12.3 11.7* 12.0  HCT 49.7* 38.8 36.9 36.6 37.6  MCV 86.0  --  83.1 84.5 85.1  PLT 368  --  262 215 742   Basic Metabolic Panel: Recent Labs  Lab 11/25/22 1749 11/25/22 1751 11/26/22 0452 11/26/22 2137 11/27/22 0307  NA  139 138 137  --  136  K 3.7 3.7 3.7  --  4.3  CL 103 102 104  --  102  CO2  --  21* 24  --  25  GLUCOSE 88 87 99  --  152*  BUN 24* 21 24*  --  23  CREATININE 0.70 0.78 1.13* 0.97 0.97  CALCIUM  --  9.4 8.4*  --  8.6*  MG  --   --   --   --  1.9   GFR: Estimated Creatinine Clearance: 63.4 mL/min (by C-G formula based on SCr of 0.97 mg/dL). Liver Function Tests: Recent Labs  Lab 11/25/22 1751 11/25/22 2152 11/27/22 0307  AST 39  --  49*  ALT 22  --  24  ALKPHOS 54  --  48  BILITOT 2.4*  --  0.9  PROT 7.2  --  5.5*  ALBUMIN 4.1 3.5 2.9*   No results for input(s):  "LIPASE", "AMYLASE" in the last 168 hours. No results for input(s): "AMMONIA" in the last 168 hours. Coagulation Profile: Recent Labs  Lab 11/25/22 1821  INR 1.1   Cardiac Enzymes: Recent Labs  Lab 11/25/22 1751 11/27/22 0307  CKTOTAL 554* 440*   BNP (last 3 results) No results for input(s): "PROBNP" in the last 8760 hours. HbA1C: No results for input(s): "HGBA1C" in the last 72 hours. CBG: No results for input(s): "GLUCAP" in the last 168 hours. Lipid Profile: No results for input(s): "CHOL", "HDL", "LDLCALC", "TRIG", "CHOLHDL", "LDLDIRECT" in the last 72 hours. Thyroid Function Tests: No results for input(s): "TSH", "T4TOTAL", "FREET4", "T3FREE", "THYROIDAB" in the last 72 hours. Anemia Panel: No results for input(s): "VITAMINB12", "FOLATE", "FERRITIN", "TIBC", "IRON", "RETICCTPCT" in the last 72 hours. Sepsis Labs: Recent Labs  Lab 11/25/22 1751 11/25/22 2036 11/25/22 2223 11/26/22 0055 11/26/22 2137 11/27/22 0307  PROCALCITON <0.10  --   --   --  0.11  --   LATICACIDVEN 3.4* 2.4* 1.4 1.0  --  1.6    Recent Results (from the past 240 hour(s))  Surgical pcr screen     Status: None   Collection Time: 11/26/22  4:20 PM   Specimen: Nasal Mucosa; Nasal Swab  Result Value Ref Range Status   MRSA, PCR NEGATIVE NEGATIVE Final   Staphylococcus aureus NEGATIVE NEGATIVE Final    Comment: (NOTE) The Xpert SA Assay (FDA approved for NASAL specimens in patients 45 years of age and older), is one component of a comprehensive surveillance program. It is not intended to diagnose infection nor to guide or monitor treatment. Performed at [facility], [place] [address], [place], [place] [address]          Radiology Studies: DG FEMUR PORT, MIN 2 VIEWS RIGHT  Result Date: 11/26/2022 CLINICAL DATA:  Intramedullary nail EXAM: RIGHT FEMUR PORTABLE 2 VIEW COMPARISON:  11/26/2022, 11/25/2022 FINDINGS: Interval intramedullary rodding of comminuted right intertrochanteric  fracture with mild residual fracture displacement. Fixating screw at the distal femur. Gas in the soft tissues consistent with recent surgery. IMPRESSION: Interval intramedullary rodding of comminuted right intertrochanteric fracture with expected postsurgical changes from Electronically Signed   By: Donavan Foil M.D.   On: 11/26/2022 21:30   DG FEMUR, MIN 2 VIEWS RIGHT  Result Date: 11/26/2022 CLINICAL DATA:  Fluoro guidance provided EXAM: RIGHT FEMUR 2 VIEWS; DG C-ARM 1-60 MIN-NO REPORT FINDINGS: Dose: 73.9 mGy Fluoro time 125s IMPRESSION: C-arm fluoro guidance provided. Electronically Signed   By: Sammie Bench M.D.   On: 11/26/2022  19:32   DG C-Arm 1-60 Min-No Report  Result Date: 11/26/2022 Fluoroscopy was utilized by the requesting physician.  No radiographic interpretation.   DG C-Arm 1-60 Min-No Report  Result Date: 11/26/2022 Fluoroscopy was utilized by the requesting physician.  No radiographic interpretation.   VAS [place] LOWER EXTREMITY VENOUS (DVT)  Result Date: 11/26/2022  Lower Venous DVT Study Patient Name:  Lorrine JALAILA CARADONNA  Date of Exam:   11/26/2022 Medical Rec #: [id number]           Accession #:    [id number] Date of Birth: Mar 04, 1951           Patient Gender: F Patient Age:   38 years Exam Location:  [facility] Procedure:      VAS [place] LOWER EXTREMITY VENOUS (DVT) Referring Phys: Myles Rosenthal --------------------------------------------------------------------------------  Indications: Bilateral leg pain s/p fall and right IM nail.  Risk Factors: Cancer history - breast. Comparison Study: No prior studies. Performing Technologist: Darlin Coco RDMS, RVT  Examination Guidelines: A complete evaluation includes B-mode imaging, spectral Doppler, color Doppler, and power Doppler as needed of all accessible portions of each vessel. Bilateral testing is considered an integral part of a complete examination. Limited examinations for reoccurring indications may be performed as  noted. The reflux portion of the exam is performed with the patient in reverse Trendelenburg.  +---------+---------------+---------+-----------+----------+--------------+ RIGHT    CompressibilityPhasicitySpontaneityPropertiesThrombus Aging +---------+---------------+---------+-----------+----------+--------------+ CFV      Full           Yes      Yes                                 +---------+---------------+---------+-----------+----------+--------------+ SFJ      Full                                                        +---------+---------------+---------+-----------+----------+--------------+ FV Prox  Full                                                        +---------+---------------+---------+-----------+----------+--------------+ FV Mid   Full                                                        +---------+---------------+---------+-----------+----------+--------------+ FV DistalFull                                                        +---------+---------------+---------+-----------+----------+--------------+ PFV      Full                                                        +---------+---------------+---------+-----------+----------+--------------+  POP      Full           Yes      Yes                                 +---------+---------------+---------+-----------+----------+--------------+ PTV      Full                                                        +---------+---------------+---------+-----------+----------+--------------+ PERO     Full                                                        +---------+---------------+---------+-----------+----------+--------------+   +---------+---------------+---------+-----------+----------+--------------+ LEFT     CompressibilityPhasicitySpontaneityPropertiesThrombus Aging +---------+---------------+---------+-----------+----------+--------------+ CFV      Full            Yes      Yes                                 +---------+---------------+---------+-----------+----------+--------------+ SFJ      Full                                                        +---------+---------------+---------+-----------+----------+--------------+ FV Prox  Full                                                        +---------+---------------+---------+-----------+----------+--------------+ FV Mid   Full                                                        +---------+---------------+---------+-----------+----------+--------------+ FV DistalFull                                                        +---------+---------------+---------+-----------+----------+--------------+ PFV      Full                                                        +---------+---------------+---------+-----------+----------+--------------+ POP      Full           Yes      Yes                                 +---------+---------------+---------+-----------+----------+--------------+  PTV      Full                                                        +---------+---------------+---------+-----------+----------+--------------+ PERO     Full                                                        +---------+---------------+---------+-----------+----------+--------------+     Summary: RIGHT: - There is no evidence of deep vein thrombosis in the lower extremity.  - No cystic structure found in the popliteal fossa.  LEFT: - There is no evidence of deep vein thrombosis in the lower extremity.  - No cystic structure found in the popliteal fossa.  *See table(s) above for measurements and observations. Electronically signed by Servando Snare MD on 11/26/2022 at 3:06:12 PM.    Final    CT HEAD WO CONTRAST  Result Date: 11/25/2022 CLINICAL DATA:  Head trauma, moderate-severe; Polytrauma, blunt EXAM: CT HEAD WITHOUT CONTRAST CT CERVICAL SPINE WITHOUT CONTRAST TECHNIQUE:  Multidetector CT imaging of the head and cervical spine was performed following the standard protocol without intravenous contrast. Multiplanar CT image reconstructions of the cervical spine were also generated. RADIATION DOSE REDUCTION: This exam was performed according to the departmental dose-optimization program which includes automated exposure control, adjustment of the mA and/or kV according to patient size and/or use of iterative reconstruction technique. COMPARISON:  None Available. FINDINGS: CT HEAD FINDINGS Brain: No evidence of large-territorial acute infarction. No parenchymal hemorrhage. No mass lesion. No extra-axial collection. Empty sella. No mass effect or midline shift. No hydrocephalus. Basilar cisterns are patent. Vascular: No hyperdense vessel. Skull: No acute fracture or focal lesion. Sinuses/Orbits: Right maxillary sinus polypoid like mucosal thickening. Otherwise paranasal sinuses and mastoid air cells are clear. The orbits are unremarkable. Other: None. CT CERVICAL SPINE FINDINGS Alignment: Normal. Skull base and vertebrae: Multilevel moderate degenerative changes of the spine. No associated severe osseous neural foraminal or central canal stenosis. No acute fracture. No aggressive appearing focal osseous lesion or focal pathologic process. Soft tissues and spinal canal: No prevertebral fluid or swelling. No visible canal hematoma. Upper chest: Unremarkable. Other: None. IMPRESSION: 1. No acute intracranial abnormality. 2. No acute displaced fracture or traumatic listhesis of the cervical spine. 3. Empty sella. Findings is often a normal anatomic variant but can be associated with idiopathic intracranial hypertension (pseudotumor cerebri). Electronically Signed   By: Iven Finn M.D.   On: 11/25/2022 19:21   CT CERVICAL SPINE WO CONTRAST  Result Date: 11/25/2022 CLINICAL DATA:  Head trauma, moderate-severe; Polytrauma, blunt EXAM: CT HEAD WITHOUT CONTRAST CT CERVICAL SPINE WITHOUT  CONTRAST TECHNIQUE: Multidetector CT imaging of the head and cervical spine was performed following the standard protocol without intravenous contrast. Multiplanar CT image reconstructions of the cervical spine were also generated. RADIATION DOSE REDUCTION: This exam was performed according to the departmental dose-optimization program which includes automated exposure control, adjustment of the mA and/or kV according to patient size and/or use of iterative reconstruction technique. COMPARISON:  None Available. FINDINGS: CT HEAD FINDINGS Brain: No evidence of large-territorial acute infarction. No parenchymal hemorrhage. No mass lesion. No extra-axial collection. Empty sella. No  mass effect or midline shift. No hydrocephalus. Basilar cisterns are patent. Vascular: No hyperdense vessel. Skull: No acute fracture or focal lesion. Sinuses/Orbits: Right maxillary sinus polypoid like mucosal thickening. Otherwise paranasal sinuses and mastoid air cells are clear. The orbits are unremarkable. Other: None. CT CERVICAL SPINE FINDINGS Alignment: Normal. Skull base and vertebrae: Multilevel moderate degenerative changes of the spine. No associated severe osseous neural foraminal or central canal stenosis. No acute fracture. No aggressive appearing focal osseous lesion or focal pathologic process. Soft tissues and spinal canal: No prevertebral fluid or swelling. No visible canal hematoma. Upper chest: Unremarkable. Other: None. IMPRESSION: 1. No acute intracranial abnormality. 2. No acute displaced fracture or traumatic listhesis of the cervical spine. 3. Empty sella. Findings is often a normal anatomic variant but can be associated with idiopathic intracranial hypertension (pseudotumor cerebri). Electronically Signed   By: Iven Finn M.D.   On: 11/25/2022 19:21   CT PELVIS WO CONTRAST  Result Date: 11/25/2022 CLINICAL DATA:  Pelvic fracture.  Fall. EXAM: CT PELVIS WITHOUT CONTRAST TECHNIQUE: Multidetector CT imaging of  the pelvis was performed following the standard protocol without intravenous contrast. RADIATION DOSE REDUCTION: This exam was performed according to the departmental dose-optimization program which includes automated exposure control, adjustment of the mA and/or kV according to patient size and/or use of iterative reconstruction technique. COMPARISON:  Pelvis x-ray same day. FINDINGS: Urinary Tract:  No abnormality visualized. Bowel: No acute abnormality. There is sigmoid colon diverticulosis. Vascular/Lymphatic: There are atherosclerotic calcifications of the aorta. No enlarged lymph nodes are seen. Reproductive:  Ovaries and uterus are within normal limits. Other: No pelvic free fluid. Small fat containing umbilical hernia. Musculoskeletal: There is an acute comminuted right femoral intratrochanteric fracture. There is 2 cm of medial displacement of the distal fracture fragment. There is no dislocation. Joint spaces are well maintained. Degenerative changes affect the sacroiliac joints and lower lumbar facet joints. IMPRESSION: 1. Acute comminuted right femoral intratrochanteric fracture. Aortic Atherosclerosis (ICD10-I70.0). Electronically Signed   By: Ronney Asters M.D.   On: 11/25/2022 18:52   DG FEMUR PORT 1V LEFT  Result Date: 11/25/2022 CLINICAL DATA:  Fall EXAM: LEFT FEMUR PORTABLE 1 VIEW COMPARISON:  None Available. FINDINGS: Femoral head projects in joint. No fracture or malalignment. Moderate degenerative changes at the knee IMPRESSION: No acute osseous abnormality Electronically Signed   By: Donavan Foil M.D.   On: 11/25/2022 18:38   DG FEMUR PORT, 1V RIGHT  Result Date: 11/25/2022 CLINICAL DATA:  Fall EXAM: RIGHT FEMUR PORTABLE 1 VIEW COMPARISON:  None Available. FINDINGS: Acute comminuted and displaced right intertrochanteric fracture. Mid to distal femur appears intact. No femoral head dislocation IMPRESSION: Acute comminuted and displaced right intertrochanteric fracture. Electronically  Signed   By: Donavan Foil M.D.   On: 11/25/2022 18:38   DG Pelvis Portable  Result Date: 11/25/2022 CLINICAL DATA:  Trauma EXAM: PORTABLE PELVIS 1-2 VIEWS COMPARISON:  None Available. FINDINGS: Limited by habitus. SI joints are non widened. Pubic symphysis and rami appear intact. Acute comminuted and displaced right intertrochanteric fracture. No femoral head dislocation IMPRESSION: Acute comminuted and displaced right intertrochanteric fracture. Electronically Signed   By: Donavan Foil M.D.   On: 11/25/2022 18:37   DG Chest Port 1 View  Result Date: 11/25/2022 CLINICAL DATA:  Trauma EXAM: PORTABLE CHEST 1 VIEW COMPARISON:  Chest x-ray 03/19/2016 FINDINGS: Cardiomediastinal silhouette is within normal limits for patient positioning. Is no focal lung infiltrate, pleural effusion or pneumothorax on this supine view. Surgical clips are seen in  the right axilla. No acute fractures are identified. IMPRESSION: 1. No acute cardiopulmonary process. 2. No acute fractures are identified. Electronically Signed   By: Ronney Asters M.D.   On: 11/25/2022 18:37        Scheduled Meds:  acetaminophen  1,000 mg Oral Q8H   vitamin C  1,000 mg Oral Daily   cholecalciferol  2,000 Units Oral BID   docusate sodium  100 mg Oral BID   enoxaparin (LOVENOX) injection  40 mg Subcutaneous Q24H   Continuous Infusions:  methocarbamol (ROBAXIN) IV       LOS: 2 days    Time spent: 45 minutes spent on chart review, discussion with nursing staff, consultants, updating family and interview/physical exam; more than 50% of that time was spent in counseling and/or coordination of care.    Geradine Girt, DO [facility] Available via Epic secure chat 7am-7pm After these hours, please refer to coverage provider listed on [email or web address] 11/27/2022, 10:32 AM

## 2022-11-28 ENCOUNTER — Encounter (HOSPITAL_COMMUNITY): Payer: Self-pay | Admitting: Orthopedic Surgery

## 2022-11-28 DIAGNOSIS — S72141A Displaced intertrochanteric fracture of right femur, initial encounter for closed fracture: Secondary | ICD-10-CM | POA: Diagnosis not present

## 2022-11-28 LAB — BASIC METABOLIC PANEL
Anion gap: 8 (ref 5–15)
BUN: 35 mg/dL — ABNORMAL HIGH (ref 8–23)
CO2: 26 mmol/L (ref 22–32)
Calcium: 8.9 mg/dL (ref 8.9–10.3)
Chloride: 104 mmol/L (ref 98–111)
Creatinine, Ser: 1.06 mg/dL — ABNORMAL HIGH (ref 0.44–1.00)
GFR, Estimated: 56 mL/min — ABNORMAL LOW (ref >60)
Glucose, Bld: 100 mg/dL — ABNORMAL HIGH (ref 70–99)
Potassium: 3.9 mmol/L (ref 3.5–5.1)
Sodium: 138 mmol/L (ref 135–145)

## 2022-11-28 LAB — CBC
HCT: 32.4 % — ABNORMAL LOW (ref 36.0–46.0)
Hemoglobin: 10.6 g/dL — ABNORMAL LOW (ref 12.0–15.0)
MCH: 27.5 pg (ref 26.0–34.0)
MCHC: 32.7 g/dL (ref 30.0–36.0)
MCV: 83.9 fL (ref 80.0–100.0)
Platelets: 262 10*3/uL (ref 150–400)
RBC: 3.86 MIL/uL — ABNORMAL LOW (ref 3.87–5.11)
RDW: 14 % (ref 11.5–15.5)
WBC: 11 10*3/uL — ABNORMAL HIGH (ref 4.0–10.5)
nRBC: 0 % (ref 0.0–0.2)

## 2022-11-28 NOTE — Progress Notes (Signed)
Physical Therapy Treatment Patient Details Name: Terri Mayer MRN: [id number] DOB: 1951-10-12 Today's Date: 11/28/2022   History of Present Illness Pt is a 72 y/o female who presents s/p fall at home, sustaining a R femur fracture. She is now s/p R IM nail of the R hip on 11/26/2022. PMH significant for breast CA, OA.    PT Comments    Pt progressing slowly towards physical therapy goals. Overall pt motivated and eager to work with therapies, however requires frequent rest breaks. Anticipate pt will progress well as pain improves. Continue to recommend AIR level rehab to maximize functional independence, safety, and decrease caregiver burden prior to return home with family support.     Recommendations for follow up therapy are one component of a multi-disciplinary discharge planning process, led by the attending physician.  Recommendations may be updated based on patient status, additional functional criteria and insurance authorization.  Follow Up Recommendations  Acute inpatient rehab (3hours/day)     Assistance Recommended at Discharge Frequent or constant Supervision/Assistance  Patient can return home with the following A lot of help with walking and/or transfers;A lot of help with bathing/dressing/bathroom;Assistance with cooking/housework;Assist for transportation;Help with stairs or ramp for entrance   Equipment Recommendations  Rolling walker (2 wheels)    Recommendations for Other Services       Precautions / Restrictions Precautions Precautions: Fall Restrictions Weight Bearing Restrictions: Yes RLE Weight Bearing: Weight bearing as tolerated     Mobility  Bed Mobility Overal bed mobility: Needs Assistance Bed Mobility: Sit to Supine       Sit to supine: Max assist, +2 for physical assistance   General bed mobility comments: +2 assist required for trunk management and to lift BLE's up into bed at end of session. Pt able to use LLE and railings of bed to  scoot up towards [facility] with bed pad assist from therapist and mobility tech.    Transfers Overall transfer level: Needs assistance Equipment used: Rolling walker (2 wheels) Transfers: Sit to/from Stand Sit to Stand: +2 physical assistance, Mod assist   Step pivot transfers: Min assist, +2 physical assistance       General transfer comment: Pt initiating power up however required assist to achieve full stand. Pt able to advance LE's easier when stepping backwards vs forwards.    Ambulation/Gait Ambulation/Gait assistance: Mod assist, +2 physical assistance Gait Distance (Feet): 5 Feet Assistive device: Rolling walker (2 wheels) Gait Pattern/deviations: Step-to pattern, Decreased stride length, Trunk flexed, Narrow base of support, Knee flexed in stance - right, Decreased weight shift to right Gait velocity: Decreased Gait velocity interpretation: <1.31 ft/sec, indicative of household ambulator   General Gait Details: Assist for balance support, weight shifting, and advancement of RLE. Pt flexing trunk at times but able to improve with cues. Tolerated ~5 feet of ambulation before needing seated rest break.   Stairs             Wheelchair Mobility    Modified Rankin (Stroke Patients Only)       Balance Overall balance assessment: Needs assistance Sitting-balance support: Bilateral upper extremity supported, Feet supported Sitting balance-Leahy Scale: Good Sitting balance - Comments: sitting EOB. Able to weight shift onto left hip to remove bedding from under buttocks.     Standing balance-Leahy Scale: Poor Standing balance comment: Relied heavily on BUE with RW to maintain balance.  Cognition Arousal/Alertness: Awake/alert Behavior During Therapy: WFL for tasks assessed/performed Overall Cognitive Status: Within Functional Limits for tasks assessed                                          Exercises       General Comments        Pertinent Vitals/Pain Pain Assessment Pain Assessment: Faces Faces Pain Scale: Hurts even more Pain Location: right hip Pain Descriptors / Indicators: Grimacing, Moaning, Operative site guarding, Sore Pain Intervention(s): Limited activity within patient's tolerance, Monitored during session, Repositioned    Home Living                          Prior Function            PT Goals (current goals can now be found in the care plan section) Acute Rehab PT Goals Patient Stated Goal: Go to inpatient rehab PT Goal Formulation: With patient Time For Goal Achievement: 12/11/22 Potential to Achieve Goals: Good Progress towards PT goals: Progressing toward goals    Frequency    Min 4X/week      PT Plan Current plan remains appropriate    Co-evaluation              AM-PAC PT "6 Clicks" Mobility   Outcome Measure  Help needed turning from your back to your side while in a flat bed without using bedrails?: A Lot Help needed moving from lying on your back to sitting on the side of a flat bed without using bedrails?: A Lot Help needed moving to and from a bed to a chair (including a wheelchair)?: Total Help needed standing up from a chair using your arms (e.g., wheelchair or bedside chair)?: Total Help needed to walk in hospital room?: Total Help needed climbing 3-5 steps with a railing? : Total 6 Click Score: 8    End of Session Equipment Utilized During Treatment: Gait belt Activity Tolerance: Patient tolerated treatment well Patient left: in chair;with call bell/phone within reach;with chair alarm set Nurse Communication: Mobility status PT Visit Diagnosis: Unsteadiness on feet (R26.81);Pain Pain - Right/Left: Right Pain - part of body: Hip     Time: 6295-2841 PT Time Calculation (min) (ACUTE ONLY): 32 min  Charges:  $Gait Training: 23-37 mins                     Rolinda Roan, PT, DPT Acute Rehabilitation Services Secure  Chat Preferred Office: [phone]    [name] 11/28/2022, 3:22 PM

## 2022-11-28 NOTE — Plan of Care (Signed)

## 2022-11-28 NOTE — Progress Notes (Signed)
Inpatient Rehabilitation Admissions Coordinator   I have received insurance approval for CIR admit. Patient and acute team made aware. I am hopeful for a bed in next 24 to 48 hrs.  Danne Baxter, RN, MSN Rehab Admissions Coordinator [phone] 11/28/2022 4:41 PM

## 2022-11-28 NOTE — PMR Pre-admission (Signed)
PMR Admission Coordinator Pre-Admission Assessment  Patient: Terri Mayer is an 72 y.o., female MRN: [id number] DOB: 05-Nov-1951 Height: '5\' 5"'$  (165.1 cm) Weight: 106.1 kg  Insurance Information HMO:     PPO: yes     PCP:      IPA:      80/20:      OTHER:  PRIMARY: [employer]    Policy#: [id number]      Subscriber: pt CM Name: Colletta [place]      Phone#: [phone]     Fax#: EPIC access Pre-Cert#: [id number] for 7 days      Employer:  Benefits:  Phone #: [phone]     Name: 1/11 Eff. Date: 11/18/22     Deduct: none      Out of Pocket Max: $3200       CIR: $295 co pay per day days 1 until 6      SNF: no co pay per day days 1 until 20; $203 co pay per day days 21 until 100 Outpatient: $15 per visit     Co-Pay: visits per medical neccesity Home Health: 100%      Co-Pay: visits per medical neccesity DME: 80%     Co-Pay: 20% Providers: in network  SECONDARY: none  Financial Counselor:       Phone#:   The Engineer, petroleum" for patients in Inpatient Rehabilitation Facilities with attached "Privacy Act [place] Records" was provided and verbally reviewed with: Patient  Emergency Contact Information Contact Information     Name Relation Home Work Mobile   [place] Son   [phone]   [facility] Sister   [phone]   Marquis Lunch Other   [phone]      Current Medical History  Patient Admitting Diagnosis: femur fracture  History of Present Illness: 72 year old female with history of BRCA in remission who presented on 11/25/22 after found down in her backyard. She was placing Christmas decorations in outside shed and fell. Was outside form 1845 until found in the  am by a neighbor.   Imaging revealed acute communited right femoral intra trochanteric fracture. Ortho consulted. S/p IM nailing on 11/26/22. WBAT. Eliquis for DVT prophylaxis for 30 days. Recommend outpatient follow up with osteoporosis clinic. Vitamin D deficiency.     Patient's [facility] record from [facility] has been reviewed by the rehabilitation admission coordinator and physician.  Past Medical History  Past Medical History:  Diagnosis Date   Allergy    Breast cancer ([place])    History of chicken pox    Osteoarthritis    Neck,Back,Knees,Hips,Ankles   Personal history of chemotherapy    Personal history of radiation therapy    Has the patient had major surgery during 100 days prior to admission? Yes  Family History   family history includes Charcot-Marie-Tooth disease in her brother; Dementia in an other family member; Diabetes in an other family member; Heart disease in her father; Transient ischemic attack in an other family member.  Current Medications  Current Facility-Administered Medications:    acetaminophen (TYLENOL) tablet 1,000 mg, 1,000 mg, Oral, Q8H, Ainsley Spinner, PA-C, 1,000 mg at 11/29/22 0651   acetaminophen (TYLENOL) tablet 325-650 mg, 325-650 mg, Oral, Q6H PRN, Ainsley Spinner, PA-C, 650 mg at 11/28/22 1118   apixaban (ELIQUIS) tablet 2.5 mg, 2.5 mg, Oral, BID, Ainsley Spinner, PA-C, 2.5 mg at 11/29/22 0388   ascorbic acid (VITAMIN C) tablet 1,000 mg, 1,000 mg, Oral, Daily, Ainsley Spinner, PA-C, 1,000 mg at 11/29/22 [phone]  cholecalciferol (VITAMIN D3) 25 MCG (1000 UNIT) tablet 2,000 Units, 2,000 Units, Oral, BID, Ainsley Spinner, PA-C, 2,000 Units at 11/29/22 3557   docusate sodium (COLACE) capsule 100 mg, 100 mg, Oral, BID, Ainsley Spinner, PA-C, 100 mg at 11/29/22 3220   feeding supplement (ENSURE ENLIVE / ENSURE PLUS) liquid 237 mL, 237 mL, Oral, BID BM, Vann, Jessica U, DO, 237 mL at 11/29/22 0828   HYDROmorphone (DILAUDID) injection 0.5-1 mg, 0.5-1 mg, Intravenous, Q4H PRN, Ainsley Spinner, PA-C, 1 mg at 11/28/22 1957   menthol-cetylpyridinium (CEPACOL) lozenge 3 mg, 1 lozenge, Oral, PRN **OR** phenol (CHLORASEPTIC) mouth spray 1 spray, 1 spray, Mouth/Throat, PRN, Ainsley Spinner, PA-C   methocarbamol (ROBAXIN) tablet 500 mg, 500 mg, Oral, Q6H  PRN, 500 mg at 11/29/22 1016 **OR** methocarbamol (ROBAXIN) 500 mg in dextrose 5 % 50 mL IVPB, 500 mg, Intravenous, Q6H PRN, Ainsley Spinner, PA-C   metoCLOPramide (REGLAN) tablet 5-10 mg, 5-10 mg, Oral, Q8H PRN **OR** metoCLOPramide (REGLAN) injection 5-10 mg, 5-10 mg, Intravenous, Q8H PRN, Ainsley Spinner, PA-C   ondansetron [facility]) tablet 4 mg, 4 mg, Oral, Q6H PRN **OR** ondansetron (ZOFRAN) injection 4 mg, 4 mg, Intravenous, Q6H PRN, Ainsley Spinner, PA-C   oxyCODONE (Oxy IR/ROXICODONE) immediate release tablet 5-10 mg, 5-10 mg, Oral, Q4H PRN, Ainsley Spinner, PA-C, 10 mg at 11/29/22 1016   polyethylene glycol (MIRALAX / GLYCOLAX) packet 17 g, 17 g, Oral, Daily PRN, Ainsley Spinner, PA-C  Patients Current Diet:  Diet Order             Diet general           Diet regular Room service appropriate? Yes; Fluid consistency: Thin  Diet effective now                  Precautions / Restrictions Precautions Precautions: Fall Restrictions Weight Bearing Restrictions: Yes RLE Weight Bearing: Weight bearing as tolerated   Has the patient had 2 or more falls or a fall with injury in the past year? Yes  Prior Activity Level Community (5-7x/wk): Mod I  Prior Functional Level Self Care: Did the patient need help bathing, dressing, using the toilet or eating? Independent  Indoor Mobility: Did the patient need assistance with walking from room to room (with or without device)? Independent  Stairs: Did the patient need assistance with internal or external stairs (with or without device)? Independent  Functional Cognition: Did the patient need help planning regular tasks such as shopping or remembering to take medications? Independent  Patient Information Are you of Hispanic, Latino/a,or Spanish origin?: A. No, not of Hispanic, Latino/a, or Spanish origin What is your race?: A. White Do you need or want an interpreter to communicate with a doctor or health care staff?: 0. No  Patient's Response To:   Health Literacy and Transportation Is the patient able to respond to health literacy and transportation needs?: Yes Health Literacy - How often do you need to have someone help you when you read instructions, pamphlets, or other written material from your doctor or pharmacy?: Never In the past 12 months, has lack of transportation kept you from medical appointments or from getting medications?: No In the past 12 months, has lack of transportation kept you from meetings, work, or from getting things needed for daily living?: No  Development worker, international aid / [place] Devices/Equipment: Eyeglasses Home Equipment: Shower seat - built in, Civil engineer, contracting, Toilet riser, Radio producer - single point, Other (comment) (walking sticks)  Prior Device Use: Indicate devices/aids used by the  patient prior to current illness, exacerbation or injury? None of the above  Current Functional Level Cognition  Overall Cognitive Status: Within Functional Limits for tasks assessed Orientation Level: Oriented X4    Extremity Assessment (includes Sensation/Coordination)  Upper Extremity Assessment: Defer to OT evaluation  Lower Extremity Assessment: RLE deficits/detail RLE Deficits / Details: Acute pain, decreased strength and AROM consistent with pre-op diagnosis and subsequent surgery.    ADLs  Overall ADL's : Needs assistance/impaired Grooming: Wash/dry face, Oral care, Set up, Sitting Upper Body Bathing: Set up, Sitting Lower Body Bathing: Total assistance, Bed level Upper Body Dressing : Minimal assistance, Sitting Lower Body Dressing: Total assistance, Bed level Toilet Transfer: Maximal assistance, BSC/3in1, Rolling walker (2 wheels), Stand-pivot, Cueing for sequencing, Cueing for safety Toileting- Clothing Manipulation and Hygiene: Total assistance, Bed level    Mobility  Overal bed mobility: Needs Assistance Bed Mobility: Sit to Supine Supine to sit: Max assist, HOB elevated Sit to supine: Max  assist, +2 for physical assistance General bed mobility comments: +2 assist required for trunk management and to lift BLE's up into bed at end of session. Pt able to use LLE and railings of bed to scoot up towards [facility] with bed pad assist from therapist and mobility tech.    Transfers  Overall transfer level: Needs assistance Equipment used: Rolling walker (2 wheels) Transfers: Sit to/from Stand Sit to Stand: +2 physical assistance, Mod assist Bed to/from chair/wheelchair/BSC transfer type:: Step pivot Stand pivot transfers: Min assist, +2 physical assistance Step pivot transfers: Min assist, +2 physical assistance General transfer comment: Pt initiating power up however required assist to achieve full stand. Pt able to advance LE's easier when stepping backwards vs forwards.    Ambulation / Gait / Stairs / Wheelchair Mobility  Ambulation/Gait Ambulation/Gait assistance: Mod assist, +2 physical assistance Gait Distance (Feet): 5 Feet Assistive device: Rolling walker (2 wheels) Gait Pattern/deviations: Step-to pattern, Decreased stride length, Trunk flexed, Narrow base of support, Knee flexed in stance - right, Decreased weight shift to right General Gait Details: Assist for balance support, weight shifting, and advancement of RLE. Pt flexing trunk at times but able to improve with cues. Tolerated ~5 feet of ambulation before needing seated rest break. Gait velocity: Decreased Gait velocity interpretation: <1.31 ft/sec, indicative of household ambulator    Posture / Balance Dynamic Sitting Balance Sitting balance - Comments: sitting EOB. Able to weight shift onto left hip to remove bedding from under buttocks. Balance Overall balance assessment: Needs assistance Sitting-balance support: Bilateral upper extremity supported, Feet supported Sitting balance-Leahy Scale: Good Sitting balance - Comments: sitting EOB. Able to weight shift onto left hip to remove bedding from under  buttocks. Standing balance-Leahy Scale: Poor Standing balance comment: Relied heavily on BUE with RW to maintain balance.    Special needs/care consideration Fall precautions   Previous Home Environment  Living Arrangements: Alone  Lives With: Alone Available Help at Discharge: Family, Available 24 hours/day (Pt reports sister and son could be available at d/c) Type of Home: House Home Layout: One level Home Access: Stairs to enter Entrance Stairs-Rails: None Entrance Stairs-Number of Steps: from carport: 1 small step down then up 1 small step then 1 full step into the home. Bathroom Shower/Tub: Gaffer, Charity fundraiser: Handicapped height Bathroom Accessibility: Yes How Accessible: Accessible via walker [place]: No  Discharge Living Setting Plans for Discharge Living Setting: Patient's home, Alone Type of Home at Discharge: House Discharge Home Layout: One level Discharge Home Access: Stairs to  enter Entrance Stairs-Rails: None Entrance Stairs-Number of Steps: from carport 1 step into house Discharge Bathroom Shower/Tub: Walk-in shower Discharge Bathroom Toilet: Handicapped height Discharge Bathroom Accessibility: Yes How Accessible: Accessible via walker Does the patient have any problems obtaining your medications?: No  Social/Family/Support Systems Contact Information: son, and sister Anticipated Caregiver: son, sister and family Anticipated Caregiver's Contact Information: see contacts Ability/Limitations of Caregiver: would arrange 24/7 assist Caregiver Availability: 24/7 Discharge Plan Discussed with Primary Caregiver: Yes Is Caregiver In Agreement with Plan?: Yes Does Caregiver/Family have Issues with Lodging/Transportation while Pt is in Rehab?: No  Goals Patient/Family Goal for Rehab: Mod I to supervision with PT and OT Expected length of stay: ELOS 10 to 14 days Pt/Family Agrees to Admission and willing to participate: Yes Program  Orientation Provided & Reviewed with Pt/Caregiver Including Roles  & Responsibilities: Yes  Decrease burden of Care through IP rehab admission: n/a  Possible need for SNF placement upon discharge: not anticipated  Patient Condition: I have reviewed medical records from [facility], spoken with CM, and patient and family member. I met with patient at the bedside and discussed via phone for inpatient rehabilitation assessment.  Patient will benefit from ongoing PT and OT, can actively participate in 3 hours of therapy a day 5 days of the week, and can make measurable gains during the admission.  Patient will also benefit from the coordinated team approach during an Inpatient Acute Rehabilitation admission.  The patient will receive intensive therapy as well as Rehabilitation physician, nursing, social worker, and care management interventions.  Due to bladder management, bowel management, safety, skin/wound care, disease management, medication administration, pain management, and patient education the patient requires 24 hour a day rehabilitation nursing.  The patient is currently mod assist with mobility and basic ADLs.  Discharge setting and therapy post discharge at home with home health is anticipated.  Patient has agreed to participate in the Acute Inpatient Rehabilitation Program and will admit today.  Preadmission Screen Completed By:  Cleatrice Burke, 11/29/2022 10:31 AM ______________________________________________________________________   Discussed status with Dr. Tressa Busman  at 1032 on 11/29/22 and received approval for admission today.  Admission Coordinator:  Cleatrice Burke, RN, time 2841 Date 11/29/22   Assessment/Plan: Diagnosis: Does the need for close, 24 hr/day Medical supervision in concert with the patient's rehab needs make it unreasonable for this patient to be served in a less intensive setting? Yes Co-Morbidities requiring supervision/potential  complications: R hip fracture s/p IM nailing, uncontrolled pain, morbid obesity, chemotherapy induced neuropathy, acute blood loss anemia. Due to bladder management, bowel management, safety, skin/wound care, medication administration, pain management, and patient education, does the patient require 24 hr/day rehab nursing? Yes Does the patient require coordinated care of a physician, rehab nurse, PT, OT  to address physical and functional deficits in the context of the above medical diagnosis(es)? Yes Addressing deficits in the following areas: balance, endurance, locomotion, strength, transferring, bowel/bladder control, bathing, dressing, and toileting Can the patient actively participate in an intensive therapy program of at least 3 hrs of therapy 5 days a week? Yes The potential for patient to make measurable gains while on inpatient rehab is excellent Anticipated functional outcomes upon discharge from inpatient rehab: supervision PT, supervision OT,  Estimated rehab length of stay to reach the above functional goals is: 10-14 days Anticipated discharge destination: Home 10. Overall Rehab/Functional Prognosis: excellent   MD Signature:  Gertie Gowda, DO 11/29/2022

## 2022-11-28 NOTE — Progress Notes (Signed)
Orthopaedic Trauma Service Progress Note  Patient ID: Terri Mayer MRN: [id number] DOB/AGE: 24-Jun-1951 72 y.o.  Subjective:  Doing ok today  Pain tolerable  + flatus + void  No BM, no Abd pain    ROS As above  Objective:   VITALS:   Vitals:   11/27/22 1446 11/27/22 1945 11/28/22 0417 11/28/22 0900  BP: (!) 104/36 (!) 104/53 124/60 (!) 99/44  Pulse: 87 98 90 83  Resp: 17 18 18 17  $ Temp: 98.4 F (36.9 C) 98.3 F (36.8 C) 98.2 F (36.8 C) 98.4 F (36.9 C)  TempSrc: Oral Oral Oral Oral  SpO2: 96% 95% 90% 98%  Weight:      Height:        Estimated body mass index is 38.94 kg/m as calculated from the following:   Height as of this encounter: 5' 5"$  (1.651 m).   Weight as of this encounter: 106.1 kg.   Intake/Output      01/10 0701 01/11 0700 01/11 0701 01/12 0700   I.V. (mL/kg)     Total Intake(mL/kg)     Urine (mL/kg/hr)     Total Output     Net          Urine Occurrence 2 x      LABS  Results for orders placed or performed during the hospital encounter of 11/25/22 (from the past 24 hour(s))  CBC     Status: Abnormal   Collection Time: 11/28/22  3:35 AM  Result Value Ref Range   WBC 11.0 (H) 4.0 - 10.5 K/uL   RBC 3.86 (L) 3.87 - 5.11 MIL/uL   Hemoglobin 10.6 (L) 12.0 - 15.0 g/dL   HCT 32.4 (L) 36.0 - 46.0 %   MCV 83.9 80.0 - 100.0 fL   MCH 27.5 26.0 - 34.0 pg   MCHC 32.7 30.0 - 36.0 g/dL   RDW 14.0 11.5 - 15.5 %   Platelets 262 150 - 400 K/uL   nRBC 0.0 0.0 - 0.2 %  Basic metabolic panel     Status: Abnormal   Collection Time: 11/28/22  3:35 AM  Result Value Ref Range   Sodium 138 135 - 145 mmol/L   Potassium 3.9 3.5 - 5.1 mmol/L   Chloride 104 98 - 111 mmol/L   CO2 26 22 - 32 mmol/L   Glucose, Bld 100 (H) 70 - 99 mg/dL   BUN 35 (H) 8 - 23 mg/dL   Creatinine, Ser 1.06 (H) 0.44 - 1.00 mg/dL   Calcium 8.9 8.9 - 10.3 mg/dL   GFR, Estimated 56 (L) >60 mL/min    Anion gap 8 5 - 15     PHYSICAL EXAM:   Gen: sitting up in chair, NAD, looks good but tired Lungs: unlabored Ext:       Right Lower Extremity              Dressings right hip and thigh are stable             Minimal swelling              DPN, SPN, TN sensation intact             EHL, FHL, lesser toe motor intact             Ankle fleixon, extension, inversion  and eversion intact             No DCT             Compartments are soft             + DP pulse                Assessment/Plan: 2 Days Post-Op   Principal Problem:   Displaced intertrochanteric fracture of right femur, initial encounter for closed fracture [facility]) Active Problems:   Morbid obesity ([place])   Anti-infectives (From admission, onward)    Start     Dose/Rate Route Frequency Ordered Stop   11/27/22 0600  ceFAZolin (ANCEF) IVPB 2g/100 mL premix        2 g 200 mL/hr over 30 Minutes Intravenous On call to O.R. 11/26/22 1556 11/26/22 1705   11/26/22 2300  ceFAZolin (ANCEF) IVPB 2g/100 mL premix        2 g 200 mL/hr over 30 Minutes Intravenous Every 6 hours 11/26/22 2116 11/27/22 0650   11/26/22 1558  ceFAZolin (ANCEF) 2-4 GM/100ML-% IVPB       Note to Pharmacy: Brandt Loosen, GRETA: cabinet override      11/26/22 1558 11/26/22 1714     .  POD/HD#: 22  72 y/o female s/p fall with comminuted R proximal femur fracutre    -closed comminuted R proximal femur fracture  Weightbearing WBAT R leg with walker                ROM/Activity                         Unrestricted ROM R hip and knee                         Activity as tolerated               Wound care                         Daily wound care starting today    Ok to shower and clean wounds with soap and water         PT/OT evals      Ice and elevate for swelling and pain control    - Pain management:             Multimodal              Minimize narcotics    - ABL anemia/Hemodynamics             Stable   - Medical issues              Per primary    - DVT/PE prophylaxis:             eliquis x 30 days   - ID:              Periop abx    - Metabolic Bone Disease:             Vitamin d deficiency                          Supplement              Fracture mechanism was a bit unusual. It was not just a simple fall but would still consider a fragility fracture  Recommend outpatient follow up with osteoporosis clinic    - Activity:             WBAT R leg    - Impediments to fracture healing:             Vitamin d deficiency              Fragility fracture   - Dispo:             Ortho issues stable. Can dc to CIR when bed available if auth received              Therapies  Hopeful for CIR    [name], PA-C [phone] (C) 11/28/2022, 10:31 AM  [facility] [place] [address] [phone] Terri Mayer (F)    After 5pm and on the weekends please log on to Amion, go to orthopaedics and the look under the [facility] Call for the provider(s) on call. You can also call our office at [phone] and then follow the prompts to be connected to the call team.  Patient ID: Terri Mayer, female   DOB: 21-Oct-1951, 72 y.o.   MRN: [id number]

## 2022-11-28 NOTE — Progress Notes (Signed)
PROGRESS NOTE    Terri Mayer  [id number]:[id number] DOB: 10-08-1951 DOA: 11/25/2022 PCP: [name], MD    Brief Narrative:   Terri Mayer is a 72 y.o. female with past medical history significant for history of breast cancer who presented to [facility] ED on 1/8 following mechanical fall at home approximately at 1845.  Patient immediately complained of pain to right lower extremity and inability to ambulate.  Patient apparently was down for roughly 22 hours before she was found by her neighbor and EMS was activated and patient was transported to ED for further evaluation.  Denies loss of consciousness.  Right femur/pelvis with acute comminuted displaced right intratrochanteric fracture.  Orthopedics was consulted.  TRH consulted for admission for further evaluation management of right hip fracture.   Assessment & Plan:   Right intertrochanteric hip fracture Patient presenting via EMS after being found down at home following mechanical fall by neighbor.  Patient reports she tripped while putting her Christmas decorations away with immediate pain to her right hip and inability ambulate.  Imaging on arrival notable for acute comminuted displaced right intertrochanteric fracture. --s/p INTRAMEDULLARY NAILING OF THE RIGHT HIP  -PT/OT-CIR  Morbid obesity Body mass index is 38.94 kg/m.  Discussed with patient needs for aggressive lifestyle changes/weight loss as this complicates all facets of care.  Outpatient follow-up with PCP.    Mild ABLA -to be expected -trend  BRCA -in remission    DVT prophylaxis: apixaban (ELIQUIS) tablet 2.5 mg Start: 11/28/22 1000 SCDs Start: 11/26/22 2117 SCDs Start: 11/25/22 2132 apixaban (ELIQUIS) tablet 2.5 mg    Code Status: Full Code Family Communication: No family present at bedside this morning  Disposition Plan:  Level of care: Med-Surg Status is: Inpatient Remains inpatient appropriate because: PT/OT eval    Consultants:   Orthopedics, Dr. Marcelino Scot    Subjective: Still with some soreness in hip  Objective: Vitals:   11/27/22 1446 11/27/22 1945 11/28/22 0417 11/28/22 0900  BP: (!) 104/36 (!) 104/53 124/60 (!) 99/44  Pulse: 87 98 90 83  Resp: '17 18 18 17  '$ Temp: 98.4 F (36.9 C) 98.3 F (36.8 C) 98.2 F (36.8 C) 98.4 F (36.9 C)  TempSrc: Oral Oral Oral Oral  SpO2: 96% 95% 90% 98%  Weight:      Height:       No intake or output data in the 24 hours ending 11/28/22 0944  Filed Weights   11/25/22 1730  Weight: 106.1 kg    Examination:  Physical Exam:   General: Appearance:    Obese female in no acute distress     Lungs:     respirations unlabored  Heart:    Normal heart rate. Normal rhythm. No murmurs, rubs, or gallops.   MS:   All extremities are intact.   Neurologic:   Awake, alert, oriented x 3. No apparent focal neurological           defect.          Data Reviewed: I have personally reviewed following labs and imaging studies  CBC: Recent Labs  Lab 11/25/22 1751 11/26/22 0019 11/26/22 0452 11/26/22 2137 11/27/22 0307 11/28/22 0335  WBC 23.2*  --  14.2* 14.0* 11.5* 11.0*  HGB 15.5* 13.1 12.3 11.7* 12.0 10.6*  HCT 49.7* 38.8 36.9 36.6 37.6 32.4*  MCV 86.0  --  83.1 84.5 85.1 83.9  PLT 368  --  262 215 244 676   Basic Metabolic Panel: Recent Labs  Lab 11/25/22 1749  11/25/22 1751 11/26/22 0452 11/26/22 2137 11/27/22 0307 11/28/22 0335  NA 139 138 137  --  136 138  K 3.7 3.7 3.7  --  4.3 3.9  CL 103 102 104  --  102 104  CO2  --  21* 24  --  25 26  GLUCOSE 88 87 99  --  152* 100*  BUN 24* 21 24*  --  23 35*  CREATININE 0.70 0.78 1.13* 0.97 0.97 1.06*  CALCIUM  --  9.4 8.4*  --  8.6* 8.9  MG  --   --   --   --  1.9  --    GFR: Estimated Creatinine Clearance: 58 mL/min (A) (by C-G formula based on SCr of 1.06 mg/dL (H)). Liver Function Tests: Recent Labs  Lab 11/25/22 1751 11/25/22 2152 11/27/22 0307  AST 39  --  49*  ALT 22  --  24  ALKPHOS 54  --  48   BILITOT 2.4*  --  0.9  PROT 7.2  --  5.5*  ALBUMIN 4.1 3.5 2.9*   No results for input(s): "LIPASE", "AMYLASE" in the last 168 hours. No results for input(s): "AMMONIA" in the last 168 hours. Coagulation Profile: Recent Labs  Lab 11/25/22 1821  INR 1.1   Cardiac Enzymes: Recent Labs  Lab 11/25/22 1751 11/27/22 0307  CKTOTAL 554* 440*   BNP (last 3 results) No results for input(s): "PROBNP" in the last 8760 hours. HbA1C: No results for input(s): "HGBA1C" in the last 72 hours. CBG: No results for input(s): "GLUCAP" in the last 168 hours. Lipid Profile: No results for input(s): "CHOL", "HDL", "LDLCALC", "TRIG", "CHOLHDL", "LDLDIRECT" in the last 72 hours. Thyroid Function Tests: No results for input(s): "TSH", "T4TOTAL", "FREET4", "T3FREE", "THYROIDAB" in the last 72 hours. Anemia Panel: No results for input(s): "VITAMINB12", "FOLATE", "FERRITIN", "TIBC", "IRON", "RETICCTPCT" in the last 72 hours. Sepsis Labs: Recent Labs  Lab 11/25/22 1751 11/25/22 2036 11/25/22 2223 11/26/22 0055 11/26/22 2137 11/27/22 0307  PROCALCITON <0.10  --   --   --  0.11  --   LATICACIDVEN 3.4* 2.4* 1.4 1.0  --  1.6    Recent Results (from the past 240 hour(s))  Surgical pcr screen     Status: None   Collection Time: 11/26/22  4:20 PM   Specimen: Nasal Mucosa; Nasal Swab  Result Value Ref Range Status   MRSA, PCR NEGATIVE NEGATIVE Final   Staphylococcus aureus NEGATIVE NEGATIVE Final    Comment: (NOTE) The Xpert SA Assay (FDA approved for NASAL specimens in patients 32 years of age and older), is one component of a comprehensive surveillance program. It is not intended to diagnose infection nor to guide or monitor treatment. Performed at [facility], [place] [address], [place], [place] [address]          Radiology Studies: DG FEMUR PORT, MIN 2 VIEWS RIGHT  Result Date: 11/26/2022 CLINICAL DATA:  Intramedullary nail EXAM: RIGHT FEMUR PORTABLE 2 VIEW COMPARISON:   11/26/2022, 11/25/2022 FINDINGS: Interval intramedullary rodding of comminuted right intertrochanteric fracture with mild residual fracture displacement. Fixating screw at the distal femur. Gas in the soft tissues consistent with recent surgery. IMPRESSION: Interval intramedullary rodding of comminuted right intertrochanteric fracture with expected postsurgical changes from Electronically Signed   By: Donavan Foil M.D.   On: 11/26/2022 21:30   DG FEMUR, MIN 2 VIEWS RIGHT  Result Date: 11/26/2022 CLINICAL DATA:  Fluoro guidance provided EXAM: RIGHT FEMUR 2 VIEWS; DG C-ARM 1-60 MIN-NO REPORT  FINDINGS: Dose: 73.9 mGy Fluoro time 125s IMPRESSION: C-arm fluoro guidance provided. Electronically Signed   By: Sammie Bench M.D.   On: 11/26/2022 19:32   DG C-Arm 1-60 Min-No Report  Result Date: 11/26/2022 Fluoroscopy was utilized by the requesting physician.  No radiographic interpretation.   DG C-Arm 1-60 Min-No Report  Result Date: 11/26/2022 Fluoroscopy was utilized by the requesting physician.  No radiographic interpretation.        Scheduled Meds:  acetaminophen  1,000 mg Oral Q8H   apixaban  2.5 mg Oral BID   vitamin C  1,000 mg Oral Daily   cholecalciferol  2,000 Units Oral BID   docusate sodium  100 mg Oral BID   feeding supplement  237 mL Oral BID BM   Continuous Infusions:  methocarbamol (ROBAXIN) IV       LOS: 3 days    Time spent: 45 minutes spent on chart review, discussion with nursing staff, consultants, updating family and interview/physical exam; more than 50% of that time was spent in counseling and/or coordination of care.    Geradine Girt, DO [facility] Available via Epic secure chat 7am-7pm After these hours, please refer to coverage provider listed on [email or web address] 11/28/2022, 9:44 AM

## 2022-11-28 NOTE — Plan of Care (Signed)
Patient transferred to the bedside this morning with 2 assist, tolerated it well.   Problem: Education: Goal: Verbalization of understanding the information provided (i.e., activity precautions, restrictions, etc) will improve Outcome: Progressing   Problem: Education: Goal: Knowledge of General Education information will improve Description: Including pain rating scale, medication(s)/side effects and non-pharmacologic comfort measures Outcome: Progressing   Problem: Activity: Goal: Risk for activity intolerance will decrease Outcome: Progressing   Problem: Pain Managment: Goal: General experience of comfort will improve Outcome: Progressing   Problem: Safety: Goal: Ability to remain free from injury will improve Outcome: Progressing   Problem: Skin Integrity: Goal: Risk for impaired skin integrity will decrease Outcome: Progressing

## 2022-11-28 NOTE — Discharge Instructions (Signed)

## 2022-11-29 ENCOUNTER — Encounter (HOSPITAL_COMMUNITY): Payer: Self-pay | Admitting: Physical Medicine and Rehabilitation

## 2022-11-29 ENCOUNTER — Inpatient Hospital Stay (HOSPITAL_COMMUNITY)
Admission: RE | Admit: 2022-11-29 | Discharge: 2022-12-13 | DRG: 560 | Disposition: A | Discharge status: Home Health | Payer: PPO | Source: Intra-hospital | Attending: Physical Medicine and Rehabilitation | Admitting: Physical Medicine and Rehabilitation

## 2022-11-29 ENCOUNTER — Other Ambulatory Visit: Payer: Self-pay

## 2022-11-29 ENCOUNTER — Encounter: Payer: Self-pay | Admitting: Oncology

## 2022-11-29 ENCOUNTER — Other Ambulatory Visit (HOSPITAL_COMMUNITY): Payer: Self-pay

## 2022-11-29 DIAGNOSIS — K5900 Constipation, unspecified: Secondary | ICD-10-CM | POA: Diagnosis not present

## 2022-11-29 DIAGNOSIS — R799 Abnormal finding of blood chemistry, unspecified: Secondary | ICD-10-CM | POA: Diagnosis not present

## 2022-11-29 DIAGNOSIS — W010XXD Fall on same level from slipping, tripping and stumbling without subsequent striking against object, subsequent encounter: Secondary | ICD-10-CM | POA: Diagnosis present

## 2022-11-29 DIAGNOSIS — K5901 Slow transit constipation: Secondary | ICD-10-CM

## 2022-11-29 DIAGNOSIS — T451X5A Adverse effect of antineoplastic and immunosuppressive drugs, initial encounter: Secondary | ICD-10-CM | POA: Diagnosis present

## 2022-11-29 DIAGNOSIS — Z853 Personal history of malignant neoplasm of breast: Secondary | ICD-10-CM

## 2022-11-29 DIAGNOSIS — S7221XD Displaced subtrochanteric fracture of right femur, subsequent encounter for closed fracture with routine healing: Secondary | ICD-10-CM | POA: Diagnosis not present

## 2022-11-29 DIAGNOSIS — R1031 Right lower quadrant pain: Secondary | ICD-10-CM | POA: Diagnosis not present

## 2022-11-29 DIAGNOSIS — Z885 Allergy status to narcotic agent status: Secondary | ICD-10-CM | POA: Diagnosis not present

## 2022-11-29 DIAGNOSIS — M79604 Pain in right leg: Secondary | ICD-10-CM | POA: Diagnosis not present

## 2022-11-29 DIAGNOSIS — S72141D Displaced intertrochanteric fracture of right femur, subsequent encounter for closed fracture with routine healing: Secondary | ICD-10-CM | POA: Diagnosis not present

## 2022-11-29 DIAGNOSIS — G620 Drug-induced polyneuropathy: Secondary | ICD-10-CM | POA: Diagnosis not present

## 2022-11-29 DIAGNOSIS — M7989 Other specified soft tissue disorders: Secondary | ICD-10-CM | POA: Diagnosis not present

## 2022-11-29 DIAGNOSIS — Z9011 Acquired absence of right breast and nipple: Secondary | ICD-10-CM | POA: Diagnosis not present

## 2022-11-29 DIAGNOSIS — Z6838 Body mass index (BMI) 38.0-38.9, adult: Secondary | ICD-10-CM | POA: Diagnosis not present

## 2022-11-29 DIAGNOSIS — S7221XA Displaced subtrochanteric fracture of right femur, initial encounter for closed fracture: Secondary | ICD-10-CM | POA: Diagnosis present

## 2022-11-29 DIAGNOSIS — D62 Acute posthemorrhagic anemia: Secondary | ICD-10-CM | POA: Diagnosis not present

## 2022-11-29 DIAGNOSIS — L84 Corns and callosities: Secondary | ICD-10-CM | POA: Diagnosis not present

## 2022-11-29 DIAGNOSIS — S7224XD Nondisplaced subtrochanteric fracture of right femur, subsequent encounter for closed fracture with routine healing: Secondary | ICD-10-CM

## 2022-11-29 DIAGNOSIS — S72141A Displaced intertrochanteric fracture of right femur, initial encounter for closed fracture: Principal | ICD-10-CM | POA: Diagnosis present

## 2022-11-29 DIAGNOSIS — Z79899 Other long term (current) drug therapy: Secondary | ICD-10-CM | POA: Diagnosis not present

## 2022-11-29 DIAGNOSIS — M79674 Pain in right toe(s): Secondary | ICD-10-CM | POA: Diagnosis not present

## 2022-11-29 MED ORDER — HYDROCODONE-ACETAMINOPHEN 5-325 MG PO TABS: 1.0000 | ORAL_TABLET | Freq: Four times a day (QID) | ORAL | 0 refills | Status: DC | PRN

## 2022-11-29 MED ORDER — VITAMIN D 25 MCG (1000 UNIT) PO TABS
2000.0000 [IU] | ORAL_TABLET | Freq: Two times a day (BID) | ORAL | Status: DC
  Administered 2022-11-29 – 2022-12-13 (×28): 2000 [IU] via ORAL
  Filled 2022-11-29 (×28): qty 2

## 2022-11-29 MED ORDER — DOCUSATE SODIUM 100 MG PO CAPS: 100.0000 mg | ORAL_CAPSULE | Freq: Two times a day (BID) | ORAL | Status: DC

## 2022-11-29 MED ORDER — OXYCODONE HCL 5 MG PO TABS
5.0000 mg | ORAL_TABLET | ORAL | Status: DC | PRN
  Administered 2022-11-29 – 2022-12-12 (×26): 10 mg via ORAL
  Filled 2022-11-29 (×10): qty 2
  Filled 2022-11-29: qty 1
  Filled 2022-11-29 (×25): qty 2

## 2022-11-29 MED ORDER — VITAMIN C 500 MG PO TABS
1000.0000 mg | ORAL_TABLET | Freq: Every day | ORAL | Status: DC
  Administered 2022-11-30 – 2022-12-13 (×14): 1000 mg via ORAL
  Filled 2022-11-29 (×14): qty 2

## 2022-11-29 MED ORDER — ACETAMINOPHEN 325 MG PO TABS
325.0000 mg | ORAL_TABLET | Freq: Four times a day (QID) | ORAL | Status: DC | PRN
  Administered 2022-11-30 – 2022-12-01 (×5): 650 mg via ORAL
  Filled 2022-11-29 (×7): qty 2

## 2022-11-29 MED ORDER — ONDANSETRON HCL 4 MG PO TABS: 4.0000 mg | ORAL_TABLET | Freq: Four times a day (QID) | ORAL | Status: DC | PRN

## 2022-11-29 MED ORDER — APIXABAN 2.5 MG PO TABS: 2.5000 mg | ORAL_TABLET | Freq: Two times a day (BID) | ORAL | Status: DC

## 2022-11-29 MED ORDER — POLYETHYLENE GLYCOL 3350 17 G PO PACK
17.0000 g | PACK | Freq: Every day | ORAL | Status: DC
  Administered 2022-11-30 – 2022-12-03 (×4): 17 g via ORAL
  Filled 2022-11-29 (×5): qty 1

## 2022-11-29 MED ORDER — ONDANSETRON HCL 4 MG/2ML IJ SOLN: 4.0000 mg | Freq: Four times a day (QID) | INTRAMUSCULAR | Status: DC | PRN

## 2022-11-29 MED ORDER — POLYETHYLENE GLYCOL 3350 17 G PO PACK: 17.0000 g | PACK | Freq: Every day | ORAL | Status: DC | PRN

## 2022-11-29 MED ORDER — SENNOSIDES-DOCUSATE SODIUM 8.6-50 MG PO TABS
1.0000 | ORAL_TABLET | Freq: Two times a day (BID) | ORAL | Status: DC
  Administered 2022-11-29 – 2022-12-02 (×5): 1 via ORAL
  Filled 2022-11-29 (×6): qty 1

## 2022-11-29 MED ORDER — MAGNESIUM HYDROXIDE 400 MG/5ML PO SUSP: 15.0000 mL | Freq: Every day | ORAL | Status: DC | PRN

## 2022-11-29 MED ORDER — METHOCARBAMOL 1000 MG/10ML IJ SOLN: 500.0000 mg | Freq: Four times a day (QID) | INTRAVENOUS | Status: DC | PRN

## 2022-11-29 MED ORDER — METHOCARBAMOL 500 MG PO TABS
500.0000 mg | ORAL_TABLET | Freq: Four times a day (QID) | ORAL | Status: DC | PRN
  Administered 2022-11-29 – 2022-12-11 (×12): 500 mg via ORAL
  Filled 2022-11-29 (×13): qty 1

## 2022-11-29 MED ORDER — POLYETHYLENE GLYCOL 3350 17 GM/SCOOP PO POWD
17.0000 g | Freq: Every day | ORAL | 0 refills | Status: DC | PRN
  Filled 2022-11-29: qty 238, 14d supply, fill #0

## 2022-11-29 MED ORDER — APIXABAN 2.5 MG PO TABS
2.5000 mg | ORAL_TABLET | Freq: Two times a day (BID) | ORAL | Status: DC
  Administered 2022-11-29 – 2022-12-13 (×28): 2.5 mg via ORAL
  Filled 2022-11-29 (×28): qty 1

## 2022-11-29 NOTE — H&P (Signed)
Expand All Collapse All      Physical Medicine and Rehabilitation Admission H&P        Chief Complaint  Patient presents with   Fall   Level 2  : HPI: Terri Mayer is a pleasant 72 year old right-handed female with history of breast cancer status postlumpectomy 2017/partial right mastectomy with chemo and radiation therapy with 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  Per chart review patient lives alone. She is a retired Pharmacist, community. 1 level home one-step to entry.  She has a sister and a son who can provide assistance.  Presented 11/25/2022 after she reportedly tripped and fell while putting away Christmas decorations.  She denied loss of consciousness.  Noted immediate pain to the right leg.  Patient was down an extended time until found by neighbor.  Cranial CT scan as well as CT cervical spine negative.  X-rays and imaging showed right comminuted subtrochanteric proximal femur fracture.  Admission chemistries unremarkable except WBC 23,200, alcohol negative, lactic acid 3.4-2.4, CK5 54, D-dimer 1.62.  Patient was cleared for surgery underwent intramedullary nailing of right hip 11/26/2022 per Dr. Altamese Masthope.  Weightbearing as tolerated right lower extremity.  Placed on Eliquis for DVT prophylaxis.  Hospital course acute blood loss anemia 10.6 and monitored.  Follow-up CK improving 440 as well as latest WBC of 11,000.  Therapy evaluations completed due to patient decreased functional mobility was admitted for a comprehensive rehab program.   Review of Systems  Constitutional:  Negative for chills and fever.  HENT:  Negative for hearing loss.   Eyes:  Negative for blurred vision and double vision.  Respiratory:  Negative for cough, shortness of breath and wheezing.   Cardiovascular:  Positive for leg swelling. Negative for chest pain and palpitations.  Gastrointestinal:  Positive for constipation. Negative for heartburn, nausea and vomiting.  Genitourinary:   Negative for dysuria, flank pain and hematuria.  Musculoskeletal:  Positive for joint pain and myalgias.  Skin:  Negative for rash.  All other systems reviewed and are negative.       Past Medical History:  Diagnosis Date   Allergy     Breast cancer ([place])     History of chicken pox     Osteoarthritis      Neck,Back,Knees,Hips,Ankles   Personal history of chemotherapy     Personal history of radiation therapy           Past Surgical History:  Procedure Laterality Date   BREAST LUMPECTOMY Right 2017   DILATION AND CURETTAGE OF UTERUS       EYE SURGERY   at age 1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   INTRAMEDULLARY (IM) NAIL INTERTROCHANTERIC Right 11/26/2022    Procedure: INTRAMEDULLARY (IM) NAIL INTERTROCHANTERIC;  Surgeon: Altamese [place], MD;  Location: [place];  Service: Orthopedics;  Laterality: Right;   PORTACATH PLACEMENT Right 12/11/2015    Procedure: INSERTION PORT-A-CATH WITH ULTRASOUND;  Surgeon: Rolm Bookbinder, MD;  Location: [place];  Service: General;  Laterality: Right;   RADIOACTIVE SEED GUIDED PARTIAL MASTECTOMY/AXILLARY SENTINEL NODE BIOPSY/AXILLARY NODE DISSECTION Right 05/02/2016    Procedure: RIGHT BREAST SEED GUIDED LUMPECTOMY AND RIGHT SEED TARGETED AXILLARY DISSECTION,AND SENTINEL NODE BIOPSY;  Surgeon: Rolm Bookbinder, MD;  Location: [facility];  Service: General;  Laterality: Right;  RIGHT BREAST SEED GUIDED LUMPECTOMY AND RIGHT SEED TARGETED AXILLARY DISSECTION,AND SENTINEL NODE BIOPSY   WISDOM TOOTH EXTRACTION             Family  History  Problem Relation Age of Onset   Heart disease Father     Diabetes Other          GM   Dementia Other          M   Transient ischemic attack Other          F   Charcot-Marie-Tooth disease Brother     Colon cancer Neg Hx     Breast cancer Neg Hx      Social History:  reports that she has never smoked. She has never used smokeless tobacco. She reports that she does not currently use alcohol. She  reports that she does not currently use drugs. Allergies:       Allergies  Allergen Reactions   Codeine Nausea And Vomiting      dizziness          Medications Prior to Admission  Medication Sig Dispense Refill   Cholecalciferol (D-3-5) 125 MCG (5000 UT) capsule Take 5,000 Units by mouth once a week.       clobetasol ointment (TEMOVATE) 9.24 % Apply 1 application topically 3 (three) times a week. Apply a thin layer on the vulva and perianal area HS x 2 weeks, then continue long term 3 times a week. 30 g 4   cyclobenzaprine (FLEXERIL) 10 MG tablet Take 10 mg by mouth daily as needed for muscle spasms.       HYDROcodone-acetaminophen (NORCO/VICODIN) 5-325 MG tablet Take 1 tablet by mouth every 6 (six) hours as needed for moderate pain.       ibuprofen (ADVIL,MOTRIN) 200 MG tablet Take 200-400 mg by mouth daily as needed for fever or moderate pain. Reported on 03/22/2016       Multiple Vitamin (MULTIVITAMIN) tablet Take 1 tablet by mouth daily.              Home: Home Living Family/patient expects to be discharged to:: Inpatient rehab Living Arrangements: Alone Available Help at Discharge: Family, Available 24 hours/day (Pt reports sister and son could be available at d/c) Type of Home: House Home Access: Stairs to enter [employer] of Steps: from carport: 1 small step down then up 1 small step then 1 full step into the home. Entrance Stairs-Rails: None Home Layout: One level Bathroom Shower/Tub: Gaffer, Charity fundraiser: Handicapped height Bathroom Accessibility: Yes Home Equipment: Civil engineer, contracting - built in, Civil engineer, contracting, Geneticist, molecular, Radio producer - single point, Other (comment) (walking sticks)  Lives With: Alone   Functional History: Prior Function Prior Level of Function : Independent/Modified Independent Mobility Comments: Pt reports that she typically lightly furniture/wall walks due to neuropathy in BLE due to cancer medication, Reports BLE knee arthritis.    Functional Status:  Mobility: Bed Mobility Overal bed mobility: Needs Assistance Bed Mobility: Sit to Supine Supine to sit: Max assist, HOB elevated Sit to supine: Max assist, +2 for physical assistance General bed mobility comments: +2 assist required for trunk management and to lift BLE's up into bed at end of session. Pt able to use LLE and railings of bed to scoot up towards [facility] with bed pad assist from therapist and mobility tech. Transfers Overall transfer level: Needs assistance Equipment used: Rolling walker (2 wheels) Transfers: Sit to/from Stand Sit to Stand: +2 physical assistance, Mod assist Bed to/from chair/wheelchair/BSC transfer type:: Step pivot Stand pivot transfers: Min assist, +2 physical assistance Step pivot transfers: Min assist, +2 physical assistance General transfer comment: Pt initiating power up however required assist to achieve full  stand. Pt able to advance LE's easier when stepping backwards vs forwards. Ambulation/Gait Ambulation/Gait assistance: Mod assist, +2 physical assistance Gait Distance (Feet): 5 Feet Assistive device: Rolling walker (2 wheels) Gait Pattern/deviations: Step-to pattern, Decreased stride length, Trunk flexed, Narrow base of support, Knee flexed in stance - right, Decreased weight shift to right General Gait Details: Assist for balance support, weight shifting, and advancement of RLE. Pt flexing trunk at times but able to improve with cues. Tolerated ~5 feet of ambulation before needing seated rest break. Gait velocity: Decreased Gait velocity interpretation: <1.31 ft/sec, indicative of household ambulator   ADL: ADL Overall ADL's : Needs assistance/impaired Grooming: Wash/dry face, Oral care, Set up, Sitting Upper Body Bathing: Set up, Sitting Lower Body Bathing: Total assistance, Bed level Upper Body Dressing : Minimal assistance, Sitting Lower Body Dressing: Total assistance, Bed level Toilet Transfer: Maximal assistance,  BSC/3in1, Rolling walker (2 wheels), Stand-pivot, Cueing for sequencing, Cueing for safety Toileting- Clothing Manipulation and Hygiene: Total assistance, Bed level   Cognition: Cognition Overall Cognitive Status: Within Functional Limits for tasks assessed Orientation Level: Oriented X4 Cognition Arousal/Alertness: Awake/alert Behavior During Therapy: WFL for tasks assessed/performed Overall Cognitive Status: Within Functional Limits for tasks assessed   Physical Exam: Blood pressure 133/78, pulse 100, temperature 98.1 F (36.7 C), temperature source Oral, resp. rate 18, height '5\' 5"'$  (1.651 m), weight 106.1 kg, SpO2 95 %. Physical Exam Constitutional: No apparent distress. Appropriate appearance for age. +Obese HENT: No JVD. Neck Supple. Trachea midline. Atraumatic, normocephalic. +glasses Eyes: PERRLA. EOMI. Visual fields grossly intact.  Cardiovascular: RRR, no murmurs/rub/gallops. No Edema. Peripheral pulses 2+  Respiratory: CTAB. No rales, rhonchi, or wheezing. On RA.  Abdomen: + bowel sounds, normoactive. No distention or tenderness.  GU: Not examined.   Skin:  No apparent lesions. Mepilex dressings on right distal thigh and proximal hip, with large surgical dressing overlying suture. All clean, dry, intact.  MSK:      Mild swelling around R thigh with some dark bruising discoloration.       Significant hip pain with attempted ROM of both the L and R thighs      Strength:                RUE: 5/5 SA, 5/5 EF, 5/5 EE, 5/5 WE, 5/5 FF, 5/5 FA                 LUE: 5/5 SA, 5/5 EF, 5/5 EE, 5/5 WE, 5/5 FF, 5/5 FA                 RLE: 2-/5 HF, 2+/5 KE, 5/5 DF, 5/5 EHL, 5/5 PF                 LLE:  2+/5 HF, 3+/5 KE, 5/5 DF, 5/5 EHL, 5/5 PF   Neurologic exam:  Cognition: AAO to person, place, time and event.  Language: Fluent, No substitutions or neoglisms. No dysarthria. Names 3/3 objects correctly.  Memory: Recalls 3/3 objects at 5 minutes. No apparent deficits  Insight: Good insight  into current condition.  Mood: Pleasant affect, appropriate mood.  Sensation: To light touch reduced in bilateral feet and ankles Reflexes: 2+ in BL UE and LEs. Negative Hoffman's and babinski signs bilaterally.  CN: 2-12 grossly intact.  Coordination: No apparent tremors.  Spasticity: MAS 0 in all extremities.      Skin:    Comments: Dry dressing intact to right hip.Sutures intact. Appropriately tender  Neurological:     Comments: Patient is  alert and oriented.  Sitting up in bed.  Makes eye contact with examiner.  Follows full commands.        Lab Results Last 48 Hours        Results for orders placed or performed during the hospital encounter of 11/25/22 (from the past 48 hour(s))  CBC     Status: Abnormal    Collection Time: 11/28/22  3:35 AM  Result Value Ref Range    WBC 11.0 (H) 4.0 - 10.5 K/uL    RBC 3.86 (L) 3.87 - 5.11 MIL/uL    Hemoglobin 10.6 (L) 12.0 - 15.0 g/dL    HCT 32.4 (L) 36.0 - 46.0 %    MCV 83.9 80.0 - 100.0 fL    MCH 27.5 26.0 - 34.0 pg    MCHC 32.7 30.0 - 36.0 g/dL    RDW 14.0 11.5 - 15.5 %    Platelets 262 150 - 400 K/uL    nRBC 0.0 0.0 - 0.2 %      Comment: Performed at [facility], [place] [address], [place] [address]  Basic metabolic panel     Status: Abnormal    Collection Time: 11/28/22  3:35 AM  Result Value Ref Range    Sodium 138 135 - 145 mmol/L    Potassium 3.9 3.5 - 5.1 mmol/L    Chloride 104 98 - 111 mmol/L    CO2 26 22 - 32 mmol/L    Glucose, Bld 100 (H) 70 - 99 mg/dL      Comment: Glucose reference range applies only to samples taken after fasting for at least 8 hours.    BUN 35 (H) 8 - 23 mg/dL    Creatinine, Ser 1.06 (H) 0.44 - 1.00 mg/dL    Calcium 8.9 8.9 - 10.3 mg/dL    GFR, Estimated 56 (L) >60 mL/min      Comment: (NOTE) Calculated using the CKD-EPI Creatinine Equation (2021)      Anion gap 8 5 - 15      Comment: Performed at [facility] [address] [place], [place] [address]      Imaging  Results (Last 48 hours)  No results found.         Blood pressure 133/78, pulse 100, temperature 98.1 F (36.7 C), temperature source Oral, resp. rate 18, height '5\' 5"'$  (1.651 m), weight 106.1 kg, SpO2 95 %.   Medical Problem List and Plan: 1. Functional deficits secondary to right comminuted subtrochanteric proximal femur fracture.  Status post intramedullary nailing of right hip 11/26/2022.  Weightbearing as tolerated             -patient may shower             -ELOS/Goals: 10-14 days, SPV PT/OT 2.  Antithrombotics: -DVT/anticoagulation:  Pharmaceutical: Eliquis - ppx for 30 days post-op.  Check vascular study             -antiplatelet therapy: N/A 3. Pain Management: Oxycodone and Robaxin as needed             - Chronic chemotherapy induced peripheral neuropathy - stable, no additional medications req 4. Mood/Behavior/Sleep: Provide emotional support             -antipsychotic agents: N/A 5. Neuropsych/cognition: This patient is capable of making decisions on her own behalf. 6. Skin/Wound Care: Routine skin checks            - Ortho PA to remove surgical dressing prior to  rehab admission per patient  7. Fluids/Electrolytes/Nutrition: Routine in and outs with follow-up chemistries 8.  Acute blood loss anemia.  Follow-up CBC 9.  Morbid obesity.  BMI 38.94.  Dietary follow-up 10.  History of right breast cancer.  Follow-up outpatient. 11.  Constipation.  Colace twice daily, MiraLAX as needed             - Per patient, LBM prior to admission           -  Increase regimen to Sennakot-S 1 tab BID + daily miralax, PRN MoM       Cathlyn Parsons, PA-C 11/29/2022  I have examined the patient independently and edited the note for HPI, ROS, exam, assessment, and plan as appropriate. I am in agreement with the above recommendations.   Gertie Gowda, DO 11/29/2022

## 2022-11-29 NOTE — Progress Notes (Signed)
Inpatient Rehabilitation Admission Medication Review by a Pharmacist  A complete drug regimen review was completed for this patient to identify any potential clinically significant medication issues.  High Risk Drug Classes Is patient taking? Indication by Medication  Antipsychotic No   Anticoagulant Yes Eliquis - ortho ppx x 30 days  Antibiotic No   Opioid Yes Oxycodone - pain  Antiplatelet No   Hypoglycemics/insulin No   Vasoactive Medication No   Chemotherapy No   Other Yes Robaxin - muscle spasms Zofran - nausea Vit D3, Vit C - supplementation     Type of Medication Issue Identified Description of Issue Recommendation(s)  Drug Interaction(s) (clinically significant)     Duplicate Therapy     Allergy     No Medication Administration End Date     Incorrect Dose     Additional Drug Therapy Needed     Significant med changes from prior encounter (inform family/care partners about these prior to discharge).    Other       Clinically significant medication issues were identified that warrant physician communication and completion of prescribed/recommended actions by midnight of the next day:  No  Name of provider notified for urgent issues identified:   Provider Method of Notification:     Pharmacist comments:   Time spent performing this drug regimen review (minutes):  [place], PharmD, BCPS

## 2022-11-29 NOTE — H&P (Incomplete)
Physical Medicine and Rehabilitation Admission H&P    Chief Complaint  Patient presents with   Fall   Level 2  : HPI: Terri Mayer. 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  Per chart review patient lives alone. She is a retired Pharmacist, community. 1 level home one-step to entry.  She has a sister and a son who can provide assistance.  Presented 11/25/2022 after she reportedly tripped and fell while putting away Christmas decorations.  She denied loss of consciousness.  Noted immediate pain to the right leg.  Patient was down an extended time until found by neighbor.  Cranial CT scan as well as CT cervical spine negative.  X-rays and imaging showed right comminuted subtrochanteric proximal femur fracture.  Admission chemistries unremarkable except WBC 23,200, alcohol negative, lactic acid 3.4-2.4, CK5 54, D-dimer 1.62.  Patient was cleared for surgery underwent intramedullary nailing of right hip 11/26/2022 per Dr. Altamese Rio.  Weightbearing as tolerated right lower extremity.  Placed on Eliquis for DVT prophylaxis.  Hospital course acute blood loss anemia 10.6 and monitored.  Follow-up CK improving 440 as well as latest WBC of 11,000.  Therapy evaluations completed due to patient decreased functional mobility was admitted for a comprehensive rehab program.  Review of Systems  Constitutional:  Negative for chills and fever.  HENT:  Negative for hearing loss.   Eyes:  Negative for blurred vision and double vision.  Respiratory:  Negative for cough, shortness of breath and wheezing.   Cardiovascular:  Positive for leg swelling. Negative for chest pain and palpitations.  Gastrointestinal:  Positive for constipation. Negative for heartburn, nausea and vomiting.  Genitourinary:  Negative for dysuria, flank pain and hematuria.   Musculoskeletal:  Positive for joint pain and myalgias.  Skin:  Negative for rash.  All other systems reviewed and are negative.  Past Medical History:  Diagnosis Date   Allergy    Breast cancer ([place])    History of chicken pox    Osteoarthritis    Neck,Back,Knees,Hips,Ankles   Personal history of chemotherapy    Personal history of radiation therapy    Past Surgical History:  Procedure Laterality Date   BREAST LUMPECTOMY Right 2017   DILATION AND CURETTAGE OF UTERUS     EYE SURGERY  at age 31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   INTRAMEDULLARY (IM) NAIL INTERTROCHANTERIC Right 11/26/2022   Procedure: INTRAMEDULLARY (IM) NAIL INTERTROCHANTERIC;  Surgeon: Altamese Henrico, MD;  Location: [facility];  Service: Orthopedics;  Laterality: Right;   PORTACATH PLACEMENT Right 12/11/2015   Procedure: INSERTION PORT-A-CATH WITH ULTRASOUND;  Surgeon: Rolm Bookbinder, MD;  Location: [facility];  Service: General;  Laterality: Right;   RADIOACTIVE SEED GUIDED PARTIAL MASTECTOMY/AXILLARY SENTINEL NODE BIOPSY/AXILLARY NODE DISSECTION Right 05/02/2016   Procedure: RIGHT BREAST SEED GUIDED LUMPECTOMY AND RIGHT SEED TARGETED AXILLARY DISSECTION,AND SENTINEL NODE BIOPSY;  Surgeon: Rolm Bookbinder, MD;  Location: [facility];  Service: General;  Laterality: Right;  RIGHT BREAST SEED GUIDED LUMPECTOMY AND RIGHT SEED TARGETED AXILLARY DISSECTION,AND SENTINEL NODE BIOPSY   WISDOM TOOTH EXTRACTION     Family History  Problem Relation Age of Onset   Heart disease Father    Diabetes Other        GM   Dementia Other        M   Transient ischemic attack Other  F   Charcot-Marie-Tooth disease Brother    Colon cancer Neg Hx    Breast cancer Neg Hx    Social History:  reports that she has never smoked. She has never used smokeless tobacco. She reports that she does not currently use alcohol. She reports that she does not currently use drugs. Allergies:  Allergies  Allergen Reactions    Codeine Nausea And Vomiting    dizziness   Medications Prior to Admission  Medication Sig Dispense Refill   Cholecalciferol (D-3-5) 125 MCG (5000 UT) capsule Take 5,000 Units by mouth once a week.     clobetasol ointment (TEMOVATE) 8.36 % Apply 1 application topically 3 (three) times a week. Apply a thin layer on the vulva and perianal area HS x 2 weeks, then continue long term 3 times a week. 30 g 4   cyclobenzaprine (FLEXERIL) 10 MG tablet Take 10 mg by mouth daily as needed for muscle spasms.     HYDROcodone-acetaminophen (NORCO/VICODIN) 5-325 MG tablet Take 1 tablet by mouth every 6 (six) hours as needed for moderate pain.     ibuprofen (ADVIL,MOTRIN) 200 MG tablet Take 200-400 mg by mouth daily as needed for fever or moderate pain. Reported on 03/22/2016     Multiple Vitamin (MULTIVITAMIN) tablet Take 1 tablet by mouth daily.        Home: Home Living Family/patient expects to be discharged to:: Inpatient rehab Living Arrangements: Alone Available Help at Discharge: Family, Available 24 hours/day (Pt reports sister and son could be available at d/c) Type of Home: House Home Access: Stairs to enter [employer] of Steps: from carport: 1 small step down then up 1 small step then 1 full step into the home. Entrance Stairs-Rails: None Home Layout: One level Bathroom Shower/Tub: Gaffer, Charity fundraiser: Handicapped height Bathroom Accessibility: Yes Home Equipment: Civil engineer, contracting - built in, Civil engineer, contracting, Geneticist, molecular, Radio producer - single point, Other (comment) (walking sticks)  Lives With: Alone   Functional History: Prior Function Prior Level of Function : Independent/Modified Independent Mobility Comments: Pt reports that she typically lightly furniture/wall walks due to neuropathy in BLE due to cancer medication, Reports BLE knee arthritis.  Functional Status:  Mobility: Bed Mobility Overal bed mobility: Needs Assistance Bed Mobility: Sit to Supine Supine to  sit: Max assist, HOB elevated Sit to supine: Max assist, +2 for physical assistance General bed mobility comments: +2 assist required for trunk management and to lift BLE's up into bed at end of session. Pt able to use LLE and railings of bed to scoot up towards [facility] with bed pad assist from therapist and mobility tech. Transfers Overall transfer level: Needs assistance Equipment used: Rolling walker (2 wheels) Transfers: Sit to/from Stand Sit to Stand: +2 physical assistance, Mod assist Bed to/from chair/wheelchair/BSC transfer type:: Step pivot Stand pivot transfers: Min assist, +2 physical assistance Step pivot transfers: Min assist, +2 physical assistance General transfer comment: Pt initiating power up however required assist to achieve full stand. Pt able to advance LE's easier when stepping backwards vs forwards. Ambulation/Gait Ambulation/Gait assistance: Mod assist, +2 physical assistance Gait Distance (Feet): 5 Feet Assistive device: Rolling walker (2 wheels) Gait Pattern/deviations: Step-to pattern, Decreased stride length, Trunk flexed, Narrow base of support, Knee flexed in stance - right, Decreased weight shift to right General Gait Details: Assist for balance support, weight shifting, and advancement of RLE. Pt flexing trunk at times but able to improve with cues. Tolerated ~5 feet of ambulation before needing seated rest break. Gait  velocity: Decreased Gait velocity interpretation: <1.31 ft/sec, indicative of household ambulator    ADL: ADL Overall ADL's : Needs assistance/impaired Grooming: Wash/dry face, Oral care, Set up, Sitting Upper Body Bathing: Set up, Sitting Lower Body Bathing: Total assistance, Bed level Upper Body Dressing : Minimal assistance, Sitting Lower Body Dressing: Total assistance, Bed level Toilet Transfer: Maximal assistance, BSC/3in1, Rolling walker (2 wheels), Stand-pivot, Cueing for sequencing, Cueing for safety Toileting- Clothing Manipulation  and Hygiene: Total assistance, Bed level  Cognition: Cognition Overall Cognitive Status: Within Functional Limits for tasks assessed Orientation Level: Oriented X4 Cognition Arousal/Alertness: Awake/alert Behavior During Therapy: WFL for tasks assessed/performed Overall Cognitive Status: Within Functional Limits for tasks assessed  Physical Exam: Blood pressure 133/78, pulse 100, temperature 98.1 F (36.7 C), temperature source Oral, resp. rate 18, height '5\' 5"'$  (1.651 m), weight 106.1 kg, SpO2 95 %. Physical Exam Skin:    Comments: Dry dressing intact to right hip.Sutures intact. Appropriately tender  Neurological:     Comments: Patient is alert and oriented.  Sitting up in bed.  Makes eye contact with examiner.  Follows full commands.     Results for orders placed or performed during the hospital encounter of 11/25/22 (from the past 48 hour(s))  CBC     Status: Abnormal   Collection Time: 11/28/22  3:35 AM  Result Value Ref Range   WBC 11.0 (H) 4.0 - 10.5 K/uL   RBC 3.86 (L) 3.87 - 5.11 MIL/uL   Hemoglobin 10.6 (L) 12.0 - 15.0 g/dL   HCT 32.4 (L) 36.0 - 46.0 %   MCV 83.9 80.0 - 100.0 fL   MCH 27.5 26.0 - 34.0 pg   MCHC 32.7 30.0 - 36.0 g/dL   RDW 14.0 11.5 - 15.5 %   Platelets 262 150 - 400 K/uL   nRBC 0.0 0.0 - 0.2 %    Comment: Performed at [facility], [place] [address], [place], [place] [address]  Basic metabolic panel     Status: Abnormal   Collection Time: 11/28/22  3:35 AM  Result Value Ref Range   Sodium 138 135 - 145 mmol/L   Potassium 3.9 3.5 - 5.1 mmol/L   Chloride 104 98 - 111 mmol/L   CO2 26 22 - 32 mmol/L   Glucose, Bld 100 (H) 70 - 99 mg/dL    Comment: Glucose reference range applies only to samples taken after fasting for at least 8 hours.   BUN 35 (H) 8 - 23 mg/dL   Creatinine, Ser 1.06 (H) 0.44 - 1.00 mg/dL   Calcium 8.9 8.9 - 10.3 mg/dL   GFR, Estimated 56 (L) >60 mL/min    Comment: (NOTE) Calculated using the CKD-EPI Creatinine Equation  (2021)    Anion gap 8 5 - 15    Comment: Performed at [facility] [address], [place], [place] [address]   No results found.    Blood pressure 133/78, pulse 100, temperature 98.1 F (36.7 C), temperature source Oral, resp. rate 18, height '5\' 5"'$  (1.651 m), weight 106.1 kg, SpO2 95 %.  Medical Problem List and Plan: 1. Functional deficits secondary to right comminuted subtrochanteric proximal femur fracture.  Status post intramedullary nailing of right hip 11/26/2022.  Weightbearing as tolerated  -patient may *** shower  -ELOS/Goals: *** 2.  Antithrombotics: -DVT/anticoagulation:  Pharmaceutical: Eliquis.  Check vascular study  -antiplatelet therapy: N/A 3. Pain Management: Oxycodone and Robaxin as needed 4. Mood/Behavior/Sleep: Provide emotional support  -antipsychotic agents: N/A 5. Neuropsych/cognition: This patient  is capable of making decisions on her own behalf. 6. Skin/Wound Care: Routine skin checks 7. Fluids/Electrolytes/Nutrition: Routine in and outs with follow-up chemistries 8.  Acute blood loss anemia.  Follow-up CBC 9.  Morbid obesity.  BMI 38.94.  Dietary follow-up 10.  History of right breast cancer.  Follow-up outpatient. 11.  Constipation.  Colace twice daily, MiraLAX as needed     Cathlyn Parsons, PA-C 11/29/2022

## 2022-11-29 NOTE — Discharge Summary (Signed)
Physician Discharge Summary  Jill Stopka TGP:[id number] DOB: [birth year] DOA: 11/25/2022  PCP: [name], MD  Admit date: 11/25/2022 Discharge date: 11/29/2022  Admitted From: home Discharge disposition: CIR   Recommendations for Outpatient Follow-Up:   To CIR  eliquis x 30 days  Recommend outpatient follow up with osteoporosis clinic    Discharge Diagnosis:   Principal Problem:   Displaced intertrochanteric fracture of right femur, initial encounter for closed fracture [facility]) Active Problems:   Morbid obesity ([place])    Discharge Condition: Improved.  Diet recommendation: Regular.  Weightbearing WBAT R leg with walker                ROM/Activity                         Unrestricted ROM R hip and knee                         Activity as tolerated               Wound care                         Daily wound care starting today                          Ok to shower and clean wounds with soap and water  Code status: Full.   History of Present Illness:   Terri Mayer is a 72 y.o. female with medical history significant of  BRCA in remission, who presents to ED BIB EMS after she was found down outside in her backyard. Per patient yesterday she went to her shed to put away Christmas Decorations and tripped over a box and fell. She states she instantly noted pain in her right leg and unable to get up.  She states she outside o the from [place] until she was found this am by a neighbor. IN the field she was noted to have stable vital signs , alert and oriented with noted shortening of her Rt leg. Patient currently noted no chest pain , sob fever chills/n/v/d /abdominal pain, and note her leg pain is well controlled.   Hospital Course by Problem:   Right intertrochanteric hip fracture Patient presenting via EMS after being found down at home following mechanical fall by neighbor.  Patient reports she tripped while putting her Christmas decorations  away with immediate pain to her right hip and inability ambulate.  Imaging on arrival notable for acute comminuted displaced right intertrochanteric fracture. --s/p INTRAMEDULLARY NAILING OF THE RIGHT HIP  -PT/OT-CIR   Morbid obesity Body mass index is 38.94 kg/m.  Discussed with patient needs for aggressive lifestyle changes/weight loss as this complicates all facets of care.  Outpatient follow-up with PCP.     Mild ABLA -to be expected -trend  BRCA -in remission     Medical Consultants:    ortho  Discharge Exam:   Vitals:   11/28/22 1924 11/29/22 0359  BP: (!) 147/87 133/78  Pulse: 100   Resp: 17 18  Temp: 98.5 F (36.9 C) 98.1 F (36.7 C)  SpO2: 97% 95%   Vitals:   11/28/22 0900 11/28/22 1421 11/28/22 1924 11/29/22 0359  BP: (!) 99/44 122/65 (!) 147/87 133/78  Pulse: 83 90 100   Resp: '17 17 17 18  '$ Temp: 98.4 F (  36.9 C) 98.3 F (36.8 C) 98.5 F (36.9 C) 98.1 F (36.7 C)  TempSrc: Oral Oral Oral Oral  SpO2: 98% 92% 97% 95%  Weight:      Height:        General exam: Appears calm and comfortable.    The results of significant diagnostics from this hospitalization (including imaging, microbiology, ancillary and laboratory) are listed below for reference.     Procedures and Diagnostic Studies:   DG FEMUR PORT, MIN 2 VIEWS RIGHT  Result Date: 11/26/2022 CLINICAL DATA:  Intramedullary nail EXAM: RIGHT FEMUR PORTABLE 2 VIEW COMPARISON:  11/26/2022, 11/25/2022 FINDINGS: Interval intramedullary rodding of comminuted right intertrochanteric fracture with mild residual fracture displacement. Fixating screw at the distal femur. Gas in the soft tissues consistent with recent surgery. IMPRESSION: Interval intramedullary rodding of comminuted right intertrochanteric fracture with expected postsurgical changes from Electronically Signed   By: Donavan Foil M.D.   On: 11/26/2022 21:30   DG FEMUR, MIN 2 VIEWS RIGHT  Result Date: 11/26/2022 CLINICAL DATA:  Fluoro guidance  provided EXAM: RIGHT FEMUR 2 VIEWS; DG C-ARM 1-60 MIN-NO REPORT FINDINGS: Dose: 73.9 mGy Fluoro time 125s IMPRESSION: C-arm fluoro guidance provided. Electronically Signed   By: Sammie Bench M.D.   On: 11/26/2022 19:32   DG C-Arm 1-60 Min-No Report  Result Date: 11/26/2022 Fluoroscopy was utilized by the requesting physician.  No radiographic interpretation.   DG C-Arm 1-60 Min-No Report  Result Date: 11/26/2022 Fluoroscopy was utilized by the requesting physician.  No radiographic interpretation.   VAS [place] LOWER EXTREMITY VENOUS (DVT)  Result Date: 11/26/2022  Lower Venous DVT Study Patient Name:  Daleisa KAZIAH KRIZEK  Date of Exam:   11/26/2022 Medical Rec #: [id number]           Accession #:    [id number] Date of Birth: [birth year]           Patient Gender: F Patient Age:   12 years Exam Location:  [facility] Procedure:      VAS [place] LOWER EXTREMITY VENOUS (DVT) Referring Phys: Myles Rosenthal --------------------------------------------------------------------------------  Indications: Bilateral leg pain s/p fall and right IM nail.  Risk Factors: Cancer history - breast. Comparison Study: No prior studies. Performing Technologist: Darlin Coco RDMS, RVT  Examination Guidelines: A complete evaluation includes B-mode imaging, spectral Doppler, color Doppler, and power Doppler as needed of all accessible portions of each vessel. Bilateral testing is considered an integral part of a complete examination. Limited examinations for reoccurring indications may be performed as noted. The reflux portion of the exam is performed with the patient in reverse Trendelenburg.  +---------+---------------+---------+-----------+----------+--------------+ RIGHT    CompressibilityPhasicitySpontaneityPropertiesThrombus Aging +---------+---------------+---------+-----------+----------+--------------+ CFV      Full           Yes      Yes                                  +---------+---------------+---------+-----------+----------+--------------+ SFJ      Full                                                        +---------+---------------+---------+-----------+----------+--------------+ FV Prox  Full                                                        +---------+---------------+---------+-----------+----------+--------------+  FV Mid   Full                                                        +---------+---------------+---------+-----------+----------+--------------+ FV DistalFull                                                        +---------+---------------+---------+-----------+----------+--------------+ PFV      Full                                                        +---------+---------------+---------+-----------+----------+--------------+ POP      Full           Yes      Yes                                 +---------+---------------+---------+-----------+----------+--------------+ PTV      Full                                                        +---------+---------------+---------+-----------+----------+--------------+ PERO     Full                                                        +---------+---------------+---------+-----------+----------+--------------+   +---------+---------------+---------+-----------+----------+--------------+ LEFT     CompressibilityPhasicitySpontaneityPropertiesThrombus Aging +---------+---------------+---------+-----------+----------+--------------+ CFV      Full           Yes      Yes                                 +---------+---------------+---------+-----------+----------+--------------+ SFJ      Full                                                        +---------+---------------+---------+-----------+----------+--------------+ FV Prox  Full                                                         +---------+---------------+---------+-----------+----------+--------------+ FV Mid   Full                                                        +---------+---------------+---------+-----------+----------+--------------+  FV DistalFull                                                        +---------+---------------+---------+-----------+----------+--------------+ PFV      Full                                                        +---------+---------------+---------+-----------+----------+--------------+ POP      Full           Yes      Yes                                 +---------+---------------+---------+-----------+----------+--------------+ PTV      Full                                                        +---------+---------------+---------+-----------+----------+--------------+ PERO     Full                                                        +---------+---------------+---------+-----------+----------+--------------+     Summary: RIGHT: - There is no evidence of deep vein thrombosis in the lower extremity.  - No cystic structure found in the popliteal fossa.  LEFT: - There is no evidence of deep vein thrombosis in the lower extremity.  - No cystic structure found in the popliteal fossa.  *See table(s) above for measurements and observations. Electronically signed by Servando Snare MD on 11/26/2022 at 3:06:12 PM.    Final    CT HEAD WO CONTRAST  Result Date: 11/25/2022 CLINICAL DATA:  Head trauma, moderate-severe; Polytrauma, blunt EXAM: CT HEAD WITHOUT CONTRAST CT CERVICAL SPINE WITHOUT CONTRAST TECHNIQUE: Multidetector CT imaging of the head and cervical spine was performed following the standard protocol without intravenous contrast. Multiplanar CT image reconstructions of the cervical spine were also generated. RADIATION DOSE REDUCTION: This exam was performed according to the departmental dose-optimization program which includes automated exposure control,  adjustment of the mA and/or kV according to patient size and/or use of iterative reconstruction technique. COMPARISON:  None Available. FINDINGS: CT HEAD FINDINGS Brain: No evidence of large-territorial acute infarction. No parenchymal hemorrhage. No mass lesion. No extra-axial collection. Empty sella. No mass effect or midline shift. No hydrocephalus. Basilar cisterns are patent. Vascular: No hyperdense vessel. Skull: No acute fracture or focal lesion. Sinuses/Orbits: Right maxillary sinus polypoid like mucosal thickening. Otherwise paranasal sinuses and mastoid air cells are clear. The orbits are unremarkable. Other: None. CT CERVICAL SPINE FINDINGS Alignment: Normal. Skull base and vertebrae: Multilevel moderate degenerative changes of the spine. No associated severe osseous neural foraminal or central canal stenosis. No acute fracture. No aggressive appearing focal osseous lesion or focal pathologic process. Soft tissues and spinal canal: No prevertebral fluid or swelling. No visible canal hematoma.  Upper chest: Unremarkable. Other: None. IMPRESSION: 1. No acute intracranial abnormality. 2. No acute displaced fracture or traumatic listhesis of the cervical spine. 3. Empty sella. Findings is often a normal anatomic variant but can be associated with idiopathic intracranial hypertension (pseudotumor cerebri). Electronically Signed   By: Iven Finn M.D.   On: 11/25/2022 19:21   CT CERVICAL SPINE WO CONTRAST  Result Date: 11/25/2022 CLINICAL DATA:  Head trauma, moderate-severe; Polytrauma, blunt EXAM: CT HEAD WITHOUT CONTRAST CT CERVICAL SPINE WITHOUT CONTRAST TECHNIQUE: Multidetector CT imaging of the head and cervical spine was performed following the standard protocol without intravenous contrast. Multiplanar CT image reconstructions of the cervical spine were also generated. RADIATION DOSE REDUCTION: This exam was performed according to the departmental dose-optimization program which includes automated  exposure control, adjustment of the mA and/or kV according to patient size and/or use of iterative reconstruction technique. COMPARISON:  None Available. FINDINGS: CT HEAD FINDINGS Brain: No evidence of large-territorial acute infarction. No parenchymal hemorrhage. No mass lesion. No extra-axial collection. Empty sella. No mass effect or midline shift. No hydrocephalus. Basilar cisterns are patent. Vascular: No hyperdense vessel. Skull: No acute fracture or focal lesion. Sinuses/Orbits: Right maxillary sinus polypoid like mucosal thickening. Otherwise paranasal sinuses and mastoid air cells are clear. The orbits are unremarkable. Other: None. CT CERVICAL SPINE FINDINGS Alignment: Normal. Skull base and vertebrae: Multilevel moderate degenerative changes of the spine. No associated severe osseous neural foraminal or central canal stenosis. No acute fracture. No aggressive appearing focal osseous lesion or focal pathologic process. Soft tissues and spinal canal: No prevertebral fluid or swelling. No visible canal hematoma. Upper chest: Unremarkable. Other: None. IMPRESSION: 1. No acute intracranial abnormality. 2. No acute displaced fracture or traumatic listhesis of the cervical spine. 3. Empty sella. Findings is often a normal anatomic variant but can be associated with idiopathic intracranial hypertension (pseudotumor cerebri). Electronically Signed   By: Iven Finn M.D.   On: 11/25/2022 19:21   CT PELVIS WO CONTRAST  Result Date: 11/25/2022 CLINICAL DATA:  Pelvic fracture.  Fall. EXAM: CT PELVIS WITHOUT CONTRAST TECHNIQUE: Multidetector CT imaging of the pelvis was performed following the standard protocol without intravenous contrast. RADIATION DOSE REDUCTION: This exam was performed according to the departmental dose-optimization program which includes automated exposure control, adjustment of the mA and/or kV according to patient size and/or use of iterative reconstruction technique. COMPARISON:  Pelvis  x-ray same day. FINDINGS: Urinary Tract:  No abnormality visualized. Bowel: No acute abnormality. There is sigmoid colon diverticulosis. Vascular/Lymphatic: There are atherosclerotic calcifications of the aorta. No enlarged lymph nodes are seen. Reproductive:  Ovaries and uterus are within normal limits. Other: No pelvic free fluid. Small fat containing umbilical hernia. Musculoskeletal: There is an acute comminuted right femoral intratrochanteric fracture. There is 2 cm of medial displacement of the distal fracture fragment. There is no dislocation. Joint spaces are well maintained. Degenerative changes affect the sacroiliac joints and lower lumbar facet joints. IMPRESSION: 1. Acute comminuted right femoral intratrochanteric fracture. Aortic Atherosclerosis (ICD10-I70.0). Electronically Signed   By: Ronney Asters M.D.   On: 11/25/2022 18:52   DG FEMUR PORT 1V LEFT  Result Date: 11/25/2022 CLINICAL DATA:  Fall EXAM: LEFT FEMUR PORTABLE 1 VIEW COMPARISON:  None Available. FINDINGS: Femoral head projects in joint. No fracture or malalignment. Moderate degenerative changes at the knee IMPRESSION: No acute osseous abnormality Electronically Signed   By: Donavan Foil M.D.   On: 11/25/2022 18:38   DG FEMUR PORT, 1V RIGHT  Result Date: 11/25/2022  CLINICAL DATA:  Fall EXAM: RIGHT FEMUR PORTABLE 1 VIEW COMPARISON:  None Available. FINDINGS: Acute comminuted and displaced right intertrochanteric fracture. Mid to distal femur appears intact. No femoral head dislocation IMPRESSION: Acute comminuted and displaced right intertrochanteric fracture. Electronically Signed   By: Donavan Foil M.D.   On: 11/25/2022 18:38   DG Pelvis Portable  Result Date: 11/25/2022 CLINICAL DATA:  Trauma EXAM: PORTABLE PELVIS 1-2 VIEWS COMPARISON:  None Available. FINDINGS: Limited by habitus. SI joints are non widened. Pubic symphysis and rami appear intact. Acute comminuted and displaced right intertrochanteric fracture. No femoral head  dislocation IMPRESSION: Acute comminuted and displaced right intertrochanteric fracture. Electronically Signed   By: Donavan Foil M.D.   On: 11/25/2022 18:37   DG Chest Port 1 View  Result Date: 11/25/2022 CLINICAL DATA:  Trauma EXAM: PORTABLE CHEST 1 VIEW COMPARISON:  Chest x-ray 03/19/2016 FINDINGS: Cardiomediastinal silhouette is within normal limits for patient positioning. Is no focal lung infiltrate, pleural effusion or pneumothorax on this supine view. Surgical clips are seen in the right axilla. No acute fractures are identified. IMPRESSION: 1. No acute cardiopulmonary process. 2. No acute fractures are identified. Electronically Signed   By: Ronney Asters M.D.   On: 11/25/2022 18:37     Labs:   Basic Metabolic Panel: Recent Labs  Lab 11/25/22 1749 11/25/22 1751 11/26/22 0452 11/26/22 2137 11/27/22 0307 11/28/22 0335  NA 139 138 137  --  136 138  K 3.7 3.7 3.7  --  4.3 3.9  CL 103 102 104  --  102 104  CO2  --  21* 24  --  25 26  GLUCOSE 88 87 99  --  152* 100*  BUN 24* 21 24*  --  23 35*  CREATININE 0.70 0.78 1.13* 0.97 0.97 1.06*  CALCIUM  --  9.4 8.4*  --  8.6* 8.9  MG  --   --   --   --  1.9  --    GFR Estimated Creatinine Clearance: 58 mL/min (A) (by C-G formula based on SCr of 1.06 mg/dL (H)). Liver Function Tests: Recent Labs  Lab 11/25/22 1751 11/25/22 2152 11/27/22 0307  AST 39  --  49*  ALT 22  --  24  ALKPHOS 54  --  48  BILITOT 2.4*  --  0.9  PROT 7.2  --  5.5*  ALBUMIN 4.1 3.5 2.9*   No results for input(s): "LIPASE", "AMYLASE" in the last 168 hours. No results for input(s): "AMMONIA" in the last 168 hours. Coagulation profile Recent Labs  Lab 11/25/22 1821  INR 1.1    CBC: Recent Labs  Lab 11/25/22 1751 11/26/22 0019 11/26/22 0452 11/26/22 2137 11/27/22 0307 11/28/22 0335  WBC 23.2*  --  14.2* 14.0* 11.5* 11.0*  HGB 15.5* 13.1 12.3 11.7* 12.0 10.6*  HCT 49.7* 38.8 36.9 36.6 37.6 32.4*  MCV 86.0  --  83.1 84.5 85.1 83.9  PLT 368   --  262 215 244 262   Cardiac Enzymes: Recent Labs  Lab 11/25/22 1751 11/27/22 0307  CKTOTAL 554* 440*   BNP: Invalid input(s): "POCBNP" CBG: No results for input(s): "GLUCAP" in the last 168 hours. D-Dimer No results for input(s): "DDIMER" in the last 72 hours. Hgb A1c No results for input(s): "HGBA1C" in the last 72 hours. Lipid Profile No results for input(s): "CHOL", "HDL", "LDLCALC", "TRIG", "CHOLHDL", "LDLDIRECT" in the last 72 hours. Thyroid function studies No results for input(s): "TSH", "T4TOTAL", "T3FREE", "THYROIDAB" in the last 72 hours.  Invalid input(s): "FREET3" Anemia work up No results for input(s): "VITAMINB12", "FOLATE", "FERRITIN", "TIBC", "IRON", "RETICCTPCT" in the last 72 hours. Microbiology Recent Results (from the past 240 hour(s))  Surgical pcr screen     Status: None   Collection Time: 11/26/22  4:20 PM   Specimen: Nasal Mucosa; Nasal Swab  Result Value Ref Range Status   MRSA, PCR NEGATIVE NEGATIVE Final   Staphylococcus aureus NEGATIVE NEGATIVE Final    Comment: (NOTE) The Xpert SA Assay (FDA approved for NASAL specimens in patients 83 years of age and older), is one component of a comprehensive surveillance program. It is not intended to diagnose infection nor to guide or monitor treatment. Performed at [facility], [place] [address], [place], [place] [address]      Discharge Instructions:   Discharge Instructions     Amb Referral to Osteoporosis Management    Complete by: As directed    Diet general   Complete by: As directed    Increase activity slowly   Complete by: As directed    No wound care   Complete by: As directed       Allergies as of 11/29/2022       Reactions   Codeine Nausea And Vomiting   dizziness        Medication List     STOP taking these medications    ibuprofen 200 MG tablet Commonly known as: ADVIL       TAKE these medications    apixaban 2.5 MG Tabs tablet Commonly known as:  ELIQUIS Take 1 tablet (2.5 mg total) by mouth 2 (two) times daily.   clobetasol ointment 0.05 % Commonly known as: TEMOVATE Apply 1 application topically 3 (three) times a week. Apply a thin layer on the vulva and perianal area HS x 2 weeks, then continue long term 3 times a week.   cyclobenzaprine 10 MG tablet Commonly known as: FLEXERIL Take 10 mg by mouth daily as needed for muscle spasms.   D-3-5 125 MCG (5000 UT) capsule Generic drug: Cholecalciferol Take 5,000 Units by mouth once a week.   HYDROcodone-acetaminophen 5-325 MG tablet Commonly known as: NORCO/VICODIN Take 1 tablet by mouth every 6 (six) hours as needed for moderate pain.   multivitamin tablet Take 1 tablet by mouth daily.   polyethylene glycol 17 g packet Commonly known as: MIRALAX / GLYCOLAX Take 17 g by mouth daily as needed for mild constipation.        Follow-up Information     [name], MD Follow up.   Specialty: Internal Medicine Contact information: [place] [address] [phone]                  Time coordinating discharge: 35 min  Signed:  Geradine Girt DO  [facility] 11/29/2022, 7:13 AM

## 2022-11-29 NOTE — Progress Notes (Signed)
  Inpatient Rehabilitation Admissions Coordinator   I have CIR bed that I can admit her to today. I met at bedside and she is in agreement. I have alerted acute team and TOC. I will make the arrangements to admit today.  Danne Baxter, RN, MSN Rehab Admissions Coordinator [phone] 11/29/2022 10:28 AM

## 2022-11-29 NOTE — Progress Notes (Signed)
Gertie Gowda, DO  Physician Physical Medicine and Rehabilitation   PMR Pre-admission    Signed   Date of Service: 11/28/2022  5:19 PM  Related encounter: ED to Hosp-Admission (Discharged) from 11/25/2022 in [place]      Show:Clear all '[x]'$ Written'[x]'$ Templated'[]'$ Copied  Added by: '[x]'$ Maddalena Linarez, Vertis Kelch, RN'[x]'$ Gertie Gowda, DO  '[]'$ [place]over for details PMR Admission Coordinator Pre-Admission Assessment   Patient: Terri Mayer is an 72 y.o., female MRN: [id number] DOB: 05/19/1951 Height: '5\' 5"'$  (165.1 cm) Weight: 106.1 kg   Insurance Information HMO:     PPO: yes     PCP:      IPA:      80/20:      OTHER:  PRIMARY: [employer]    Policy#: [id number]      Subscriber: pt CM Name: Colletta [place]      Phone#: [phone]     Fax#: EPIC access Pre-Cert#: [id number] for 7 days      Employer:  Benefits:  Phone #: [phone]     Name: 1/11 Eff. Date: 11/18/22     Deduct: none      Out of Pocket Max: $3200       CIR: $295 co pay per day days 1 until 6      SNF: no co pay per day days 1 until 20; $203 co pay per day days 21 until 100 Outpatient: $15 per visit     Co-Pay: visits per medical neccesity Home Health: 100%      Co-Pay: visits per medical neccesity DME: 80%     Co-Pay: 20% Providers: in network  SECONDARY: none   Financial Counselor:       Phone#:    The Engineer, petroleum" for patients in Inpatient Rehabilitation Facilities with attached "Privacy Act [facility] Records" was provided and verbally reviewed with: Patient   Emergency Contact Information Contact Information       Name Relation Home Work Mobile    [place] Son     [phone]    [facility] Sister     [phone]    Marquis Lunch Other     [phone]         Current Medical History  Patient Admitting Diagnosis: femur fracture   History of Present Illness: 72 year old female with history of BRCA in  remission who presented on 11/25/22 after found down in her backyard. She was placing Christmas decorations in outside shed and fell. Was outside form 1845 until found in the  am by a neighbor.    Imaging revealed acute communited right femoral intra trochanteric fracture. Ortho consulted. S/p IM nailing on 11/26/22. WBAT. Eliquis for DVT prophylaxis for 30 days. Recommend outpatient follow up with osteoporosis clinic. Vitamin D deficiency.     Patient's medical record from [facility] has been reviewed by the rehabilitation admission coordinator and physician.   Past Medical History      Past Medical History:  Diagnosis Date   Allergy     Breast cancer ([place])     History of chicken pox     Osteoarthritis      Neck,Back,Knees,Hips,Ankles   Personal history of chemotherapy     Personal history of radiation therapy      Has the patient had major surgery during 100 days prior to admission? Yes   Family History   family history includes Charcot-Marie-Tooth disease in her brother; Dementia in an other family member;  Diabetes in an other family member; Heart disease in her father; Transient ischemic attack in an other family member.   Current Medications   Current Facility-Administered Medications:    acetaminophen (TYLENOL) tablet 1,000 mg, 1,000 mg, Oral, Q8H, Ainsley Spinner, PA-C, 1,000 mg at 11/29/22 0651   acetaminophen (TYLENOL) tablet 325-650 mg, 325-650 mg, Oral, Q6H PRN, Ainsley Spinner, PA-C, 650 mg at 11/28/22 1118   apixaban (ELIQUIS) tablet 2.5 mg, 2.5 mg, Oral, BID, Ainsley Spinner, PA-C, 2.5 mg at 11/29/22 1962   ascorbic acid (VITAMIN C) tablet 1,000 mg, 1,000 mg, Oral, Daily, Ainsley Spinner, PA-C, 1,000 mg at 11/29/22 2297   cholecalciferol (VITAMIN D3) 25 MCG (1000 UNIT) tablet 2,000 Units, 2,000 Units, Oral, BID, Ainsley Spinner, PA-C, 2,000 Units at 11/29/22 9892   docusate sodium (COLACE) capsule 100 mg, 100 mg, Oral, BID, Ainsley Spinner, PA-C, 100 mg at 11/29/22 1194   feeding  supplement (ENSURE ENLIVE / ENSURE PLUS) liquid 237 mL, 237 mL, Oral, BID BM, Vann, Jessica U, DO, 237 mL at 11/29/22 0828   HYDROmorphone (DILAUDID) injection 0.5-1 mg, 0.5-1 mg, Intravenous, Q4H PRN, Ainsley Spinner, PA-C, 1 mg at 11/28/22 1957   menthol-cetylpyridinium (CEPACOL) lozenge 3 mg, 1 lozenge, Oral, PRN **OR** phenol (CHLORASEPTIC) mouth spray 1 spray, 1 spray, Mouth/Throat, PRN, Ainsley Spinner, PA-C   methocarbamol (ROBAXIN) tablet 500 mg, 500 mg, Oral, Q6H PRN, 500 mg at 11/29/22 1016 **OR** methocarbamol (ROBAXIN) 500 mg in dextrose 5 % 50 mL IVPB, 500 mg, Intravenous, Q6H PRN, Ainsley Spinner, PA-C   metoCLOPramide (REGLAN) tablet 5-10 mg, 5-10 mg, Oral, Q8H PRN **OR** metoCLOPramide (REGLAN) injection 5-10 mg, 5-10 mg, Intravenous, Q8H PRN, Ainsley Spinner, PA-C   ondansetron [facility]) tablet 4 mg, 4 mg, Oral, Q6H PRN **OR** ondansetron (ZOFRAN) injection 4 mg, 4 mg, Intravenous, Q6H PRN, Ainsley Spinner, PA-C   oxyCODONE (Oxy IR/ROXICODONE) immediate release tablet 5-10 mg, 5-10 mg, Oral, Q4H PRN, Ainsley Spinner, PA-C, 10 mg at 11/29/22 1016   polyethylene glycol (MIRALAX / GLYCOLAX) packet 17 g, 17 g, Oral, Daily PRN, Ainsley Spinner, PA-C   Patients Current Diet:  Diet Order                  Diet general             Diet regular Room service appropriate? Yes; Fluid consistency: Thin  Diet effective now                       Precautions / Restrictions Precautions Precautions: Fall Restrictions Weight Bearing Restrictions: Yes RLE Weight Bearing: Weight bearing as tolerated    Has the patient had 2 or more falls or a fall with injury in the past year? Yes   Prior Activity Level Community (5-7x/wk): Mod I   Prior Functional Level Self Care: Did the patient need help bathing, dressing, using the toilet or eating? Independent   Indoor Mobility: Did the patient need assistance with walking from room to room (with or without device)? Independent   Stairs: Did the patient need assistance  with internal or external stairs (with or without device)? Independent   Functional Cognition: Did the patient need help planning regular tasks such as shopping or remembering to take medications? Independent   Patient Information Are you of Hispanic, Latino/a,or Spanish origin?: A. No, not of Hispanic, Latino/a, or Spanish origin What is your race?: A. White Do you need or want an interpreter to communicate with a doctor or health care staff?: 0. No  Patient's Response To:  Health Literacy and Transportation Is the patient able to respond to health literacy and transportation needs?: Yes Health Literacy - How often do you need to have someone help you when you read instructions, pamphlets, or other written material from your doctor or pharmacy?: Never In the past 12 months, has lack of transportation kept you from medical appointments or from getting medications?: No In the past 12 months, has lack of transportation kept you from meetings, work, or from getting things needed for daily living?: No   Development worker, international aid / [place] Devices/Equipment: Eyeglasses Home Equipment: Civil engineer, contracting - built in, Civil engineer, contracting, Toilet riser, Radio producer - single point, Other (comment) (walking sticks)   Prior Device Use: Indicate devices/aids used by the patient prior to current illness, exacerbation or injury? None of the above   Current Functional Level Cognition   Overall Cognitive Status: Within Functional Limits for tasks assessed Orientation Level: Oriented X4    Extremity Assessment (includes Sensation/Coordination)   Upper Extremity Assessment: Defer to OT evaluation  Lower Extremity Assessment: RLE deficits/detail RLE Deficits / Details: Acute pain, decreased strength and AROM consistent with pre-op diagnosis and subsequent surgery.     ADLs   Overall ADL's : Needs assistance/impaired Grooming: Wash/dry face, Oral care, Set up, Sitting Upper Body Bathing: Set up,  Sitting Lower Body Bathing: Total assistance, Bed level Upper Body Dressing : Minimal assistance, Sitting Lower Body Dressing: Total assistance, Bed level Toilet Transfer: Maximal assistance, BSC/3in1, Rolling walker (2 wheels), Stand-pivot, Cueing for sequencing, Cueing for safety Toileting- Clothing Manipulation and Hygiene: Total assistance, Bed level     Mobility   Overal bed mobility: Needs Assistance Bed Mobility: Sit to Supine Supine to sit: Max assist, HOB elevated Sit to supine: Max assist, +2 for physical assistance General bed mobility comments: +2 assist required for trunk management and to lift BLE's up into bed at end of session. Pt able to use LLE and railings of bed to scoot up towards [facility] with bed pad assist from therapist and mobility tech.     Transfers   Overall transfer level: Needs assistance Equipment used: Rolling walker (2 wheels) Transfers: Sit to/from Stand Sit to Stand: +2 physical assistance, Mod assist Bed to/from chair/wheelchair/BSC transfer type:: Step pivot Stand pivot transfers: Min assist, +2 physical assistance Step pivot transfers: Min assist, +2 physical assistance General transfer comment: Pt initiating power up however required assist to achieve full stand. Pt able to advance LE's easier when stepping backwards vs forwards.     Ambulation / Gait / Stairs / Wheelchair Mobility   Ambulation/Gait Ambulation/Gait assistance: Mod assist, +2 physical assistance Gait Distance (Feet): 5 Feet Assistive device: Rolling walker (2 wheels) Gait Pattern/deviations: Step-to pattern, Decreased stride length, Trunk flexed, Narrow base of support, Knee flexed in stance - right, Decreased weight shift to right General Gait Details: Assist for balance support, weight shifting, and advancement of RLE. Pt flexing trunk at times but able to improve with cues. Tolerated ~5 feet of ambulation before needing seated rest break. Gait velocity: Decreased Gait velocity  interpretation: <1.31 ft/sec, indicative of household ambulator     Posture / Balance Dynamic Sitting Balance Sitting balance - Comments: sitting EOB. Able to weight shift onto left hip to remove bedding from under buttocks. Balance Overall balance assessment: Needs assistance Sitting-balance support: Bilateral upper extremity supported, Feet supported Sitting balance-Leahy Scale: Good Sitting balance - Comments: sitting EOB. Able to weight shift onto left hip to remove bedding from under  buttocks. Standing balance-Leahy Scale: Poor Standing balance comment: Relied heavily on BUE with RW to maintain balance.     Special needs/care consideration Fall precautions    Previous Home Environment  Living Arrangements: Alone  Lives With: Alone Available Help at Discharge: Family, Available 24 hours/day (Pt reports sister and son could be available at d/c) Type of Home: House Home Layout: One level Home Access: Stairs to enter Entrance Stairs-Rails: None Entrance Stairs-Number of Steps: from carport: 1 small step down then up 1 small step then 1 full step into the home. Bathroom Shower/Tub: Gaffer, Charity fundraiser: Handicapped height Bathroom Accessibility: Yes How Accessible: Accessible via walker [place]: No   Discharge Living Setting Plans for Discharge Living Setting: Patient's home, Alone Type of Home at Discharge: House Discharge Home Layout: One level Discharge Home Access: Stairs to enter Entrance Stairs-Rails: None Entrance Stairs-Number of Steps: from carport 1 step into house Discharge Bathroom Shower/Tub: Walk-in shower Discharge Bathroom Toilet: Handicapped height Discharge Bathroom Accessibility: Yes How Accessible: Accessible via walker Does the patient have any problems obtaining your medications?: No   Social/Family/Support Systems Contact Information: son, and sister Anticipated Caregiver: son, sister and family Anticipated Caregiver's  Contact Information: see contacts Ability/Limitations of Caregiver: would arrange 24/7 assist Caregiver Availability: 24/7 Discharge Plan Discussed with Primary Caregiver: Yes Is Caregiver In Agreement with Plan?: Yes Does Caregiver/Family have Issues with Lodging/Transportation while Pt is in Rehab?: No   Goals Patient/Family Goal for Rehab: Mod I to supervision with PT and OT Expected length of stay: ELOS 10 to 14 days Pt/Family Agrees to Admission and willing to participate: Yes Program Orientation Provided & Reviewed with Pt/Caregiver Including Roles  & Responsibilities: Yes   Decrease burden of Care through IP rehab admission: n/a   Possible need for SNF placement upon discharge: not anticipated   Patient Condition: I have reviewed [facility], spoken with CM, and patient and family member. I met with patient at the bedside and discussed via phone for inpatient rehabilitation assessment.  Patient will benefit from ongoing PT and OT, can actively participate in 3 hours of therapy a day 5 days of the week, and can make measurable gains during the admission.  Patient will also benefit from the coordinated team approach during an Inpatient Acute Rehabilitation admission.  The patient will receive intensive therapy as well as Rehabilitation physician, nursing, social worker, and care management interventions.  Due to bladder management, bowel management, safety, skin/wound care, disease management, medication administration, pain management, and patient education the patient requires 24 hour a day rehabilitation nursing.  The patient is currently mod assist with mobility and basic ADLs.  Discharge setting and therapy post discharge at home with home health is anticipated.  Patient has agreed to participate in the Acute Inpatient Rehabilitation Program and will admit today.   Preadmission Screen Completed By:  Cleatrice Burke, 11/29/2022 10:31  AM ______________________________________________________________________   Discussed status with Dr. Tressa Busman  at 1032 on 11/29/22 and received approval for admission today.   Admission Coordinator:  Cleatrice Burke, RN, time 7672 Date 11/29/22    Assessment/Plan: Diagnosis: Does the need for close, 24 hr/day Medical supervision in concert with the patient's rehab needs make it unreasonable for this patient to be served in a less intensive setting? Yes Co-Morbidities requiring supervision/potential complications: R hip fracture s/p IM nailing, uncontrolled pain, morbid obesity, chemotherapy induced neuropathy, acute blood loss anemia. Due to bladder management, bowel management, safety, skin/wound care,  medication administration, pain management, and patient education, does the patient require 24 hr/day rehab nursing? Yes Does the patient require coordinated care of a physician, rehab nurse, PT, OT  to address physical and functional deficits in the context of the above medical diagnosis(es)? Yes Addressing deficits in the following areas: balance, endurance, locomotion, strength, transferring, bowel/bladder control, bathing, dressing, and toileting Can the patient actively participate in an intensive therapy program of at least 3 hrs of therapy 5 days a week? Yes The potential for patient to make measurable gains while on inpatient rehab is excellent Anticipated functional outcomes upon discharge from inpatient rehab: supervision PT, supervision OT,  Estimated rehab length of stay to reach the above functional goals is: 10-14 days Anticipated discharge destination: Home 10. Overall Rehab/Functional Prognosis: excellent     MD Signature:   Gertie Gowda, DO 11/29/2022          Revision History

## 2022-11-30 ENCOUNTER — Inpatient Hospital Stay (HOSPITAL_COMMUNITY): Payer: PPO

## 2022-11-30 DIAGNOSIS — M7989 Other specified soft tissue disorders: Secondary | ICD-10-CM

## 2022-11-30 DIAGNOSIS — K5901 Slow transit constipation: Secondary | ICD-10-CM

## 2022-11-30 DIAGNOSIS — S72141D Displaced intertrochanteric fracture of right femur, subsequent encounter for closed fracture with routine healing: Secondary | ICD-10-CM

## 2022-11-30 MED ORDER — SORBITOL 70 % SOLN: 30.0000 mL | Freq: Every day | Status: DC | PRN

## 2022-11-30 MED ORDER — POLYETHYLENE GLYCOL 3350 17 G PO PACK: 17.0000 g | PACK | Freq: Every day | ORAL | Status: DC | PRN

## 2022-11-30 MED ORDER — FLEET ENEMA 7-19 GM/118ML RE ENEM: 1.0000 | ENEMA | Freq: Every day | RECTAL | Status: DC | PRN

## 2022-11-30 NOTE — Plan of Care (Signed)
  Problem: RH Balance Goal: LTG Patient will maintain dynamic standing with ADLs (OT) Description: LTG:  Patient will maintain dynamic standing balance with assist during activities of daily living (OT)  Flowsheets (Taken 11/30/2022 1622) LTG: Pt will maintain dynamic standing balance during ADLs with: Supervision/Verbal cueing   Problem: Sit to Stand Goal: LTG:  Patient will perform sit to stand in prep for activites of daily living with assistance level (OT) Description: LTG:  Patient will perform sit to stand in prep for activites of daily living with assistance level (OT) Flowsheets (Taken 11/30/2022 1622) LTG: PT will perform sit to stand in prep for activites of daily living with assistance level: Supervision/Verbal cueing   Problem: RH Bathing Goal: LTG Patient will bathe all body parts with assist levels (OT) Description: LTG: Patient will bathe all body parts with assist levels (OT) Flowsheets (Taken 11/30/2022 1622) LTG: Pt will perform bathing with assistance level/cueing: Supervision/Verbal cueing   Problem: RH Dressing Goal: LTG Patient will perform lower body dressing w/assist (OT) Description: LTG: Patient will perform lower body dressing with assist, with/without cues in positioning using equipment (OT) Flowsheets (Taken 11/30/2022 1622) LTG: Pt will perform lower body dressing with assistance level of: Supervision/Verbal cueing   Problem: RH Toileting Goal: LTG Patient will perform toileting task (3/3 steps) with assistance level (OT) Description: LTG: Patient will perform toileting task (3/3 steps) with assistance level (OT)  Flowsheets (Taken 11/30/2022 1622) LTG: Pt will perform toileting task (3/3 steps) with assistance level: Supervision/Verbal cueing   Problem: RH Toilet Transfers Goal: LTG Patient will perform toilet transfers w/assist (OT) Description: LTG: Patient will perform toilet transfers with assist, with/without cues using equipment (OT) Flowsheets (Taken  11/30/2022 1622) LTG: Pt will perform toilet transfers with assistance level of: Supervision/Verbal cueing   Problem: RH Tub/Shower Transfers Goal: LTG Patient will perform tub/shower transfers w/assist (OT) Description: LTG: Patient will perform tub/shower transfers with assist, with/without cues using equipment (OT) Flowsheets (Taken 11/30/2022 1622) LTG: Pt will perform tub/shower stall transfers with assistance level of: Supervision/Verbal cueing

## 2022-11-30 NOTE — Evaluation (Addendum)
Physical Therapy Assessment and Plan  Patient Details  Name: Terri Mayer MRN: [id number] Date of Birth: 1951-01-25  PT Diagnosis: Difficulty walking, Muscle weakness, and Pain in RLE Rehab Potential: Good ELOS: 10-14 days   Today's Date: 11/30/2022 PT Individual Time:  Session 1: 1045-1200    Session 2: 1345-1440 PT Individual Time Calculation (min):  Session 1: 75 min         Session 2: 32 min  Hospital Problem: Principal Problem:   Intertrochanteric fracture of right hip [facility])   Past Medical History:  Past Medical History:  Diagnosis Date   Allergy    Breast cancer ([place])    History of chicken pox    Osteoarthritis    Neck,Back,Knees,Hips,Ankles   Personal history of chemotherapy    Personal history of radiation therapy    Past Surgical History:  Past Surgical History:  Procedure Laterality Date   BREAST LUMPECTOMY Right 2017   DILATION AND CURETTAGE OF UTERUS     EYE SURGERY  at age 24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   INTRAMEDULLARY (IM) NAIL INTERTROCHANTERIC Right 11/26/2022   Procedure: INTRAMEDULLARY (IM) NAIL INTERTROCHANTERIC;  Surgeon: Terri [place], MD;  Location: [place];  Service: Orthopedics;  Laterality: Right;   PORTACATH PLACEMENT Right 12/11/2015   Procedure: INSERTION PORT-A-CATH WITH ULTRASOUND;  Surgeon: Terri Bookbinder, MD;  Location: [place];  Service: General;  Laterality: Right;   RADIOACTIVE SEED GUIDED PARTIAL MASTECTOMY/AXILLARY SENTINEL NODE BIOPSY/AXILLARY NODE DISSECTION Right 05/02/2016   Procedure: RIGHT BREAST SEED GUIDED LUMPECTOMY AND RIGHT SEED TARGETED AXILLARY DISSECTION,AND SENTINEL NODE BIOPSY;  Surgeon: Terri Bookbinder, MD;  Location: [place];  Service: General;  Laterality: Right;  RIGHT BREAST SEED GUIDED LUMPECTOMY AND RIGHT SEED TARGETED AXILLARY DISSECTION,AND SENTINEL NODE BIOPSY   WISDOM TOOTH EXTRACTION      Assessment & Plan Clinical Impression: Patient is a pleasant 72 year old  right-handed female with history of breast cancer status postlumpectomy 2017/partial right mastectomy with chemo and radiation therapy with 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  Per chart review patient lives alone. She is a retired Pharmacist, community. 1 level home one-step to entry.  She has a sister and a son who can provide assistance.  Presented 11/25/2022 after she reportedly tripped and fell while putting away Christmas decorations.  She denied loss of consciousness.  Noted immediate pain to the right leg.  Patient was down an extended time until found by neighbor.  Cranial CT scan as well as CT cervical spine negative.  X-rays and imaging showed right comminuted subtrochanteric proximal femur fracture.  Admission chemistries unremarkable except WBC 23,200, alcohol negative, lactic acid 3.4-2.4, CK5 54, D-dimer 1.62.  Patient was cleared for surgery underwent intramedullary nailing of right hip 11/26/2022 per Dr. Altamese [place].  Weightbearing as tolerated right lower extremity.  Placed on Eliquis for DVT prophylaxis.  Hospital course acute blood loss anemia 10.6 and monitored.  Follow-up CK improving 440 as well as latest WBC of 11,000.  Therapy evaluations completed due to patient decreased functional mobility was admitted for a comprehensive rehab program. Patient transferred to CIR on 11/29/2022 .   Patient currently requires max with mobility secondary to muscle weakness.  Prior to hospitalization, patient was independent  with mobility and lived with Alone in a House home.  Home access is From carport: 1 small step down then up 1 small step then 1 full step into the home.Stairs to enter.  Patient will benefit from skilled PT intervention to maximize  safe functional mobility, minimize fall risk, and decrease caregiver burden for planned discharge home with 24 hour assist.  Anticipate patient will benefit from follow up [facility] at discharge.  PT - End of Session Activity Tolerance:  Tolerates 30+ min activity with multiple rests Endurance Deficit: Yes Endurance Deficit Description: activity tolerance limited 2/2 pain PT Assessment Rehab Potential (ACUTE/IP ONLY): Good PT Barriers to Discharge: Weight;Lack of/limited family support;Home environment access/layout PT Barriers to Discharge Comments: lives alone, but family and neighbors may be able to help; 1 STE with no rails PT Patient demonstrates impairments in the following area(s): Balance;Endurance;Pain;Safety PT Transfers Functional Problem(s): Bed Mobility;Bed to Chair;Car PT Locomotion Functional Problem(s): Ambulation;Stairs PT Plan PT Intensity: Minimum of 1-2 x/day ,45 to 90 minutes PT Frequency: 5 out of 7 days PT Duration Estimated Length of Stay: 10-14 days PT Treatment/Interventions: Ambulation/gait training;Balance/vestibular training;Discharge planning;DME/adaptive equipment instruction;Functional mobility training;Pain management;Patient/family education;Stair training;Therapeutic Activities;Therapeutic Exercise;UE/LE Strength taining/ROM;UE/LE Coordination activities;Wheelchair propulsion/positioning PT Transfers Anticipated Outcome(s): CGA + LRAD PT Locomotion Anticipated Outcome(s): CAG + LRAD PT Recommendation Follow Up Recommendations: Home health PT Patient destination: Home Equipment Recommended: To be determined;Rolling walker with 5" wheels Equipment Details: TBD on WC   PT Evaluation Precautions/Restrictions Precautions Precautions: Fall Restrictions Weight Bearing Restrictions: Yes RLE Weight Bearing: Weight bearing as tolerated General Chart Reviewed: Yes Vital Signs  Pain Pain Assessment Pain Scale: 0-10 Pain Score: 1  Pain Type: Surgical pain Pain Location: Leg Pain Orientation: Right Pain Interference Pain Interference Pain Effect on Sleep: 2. Occasionally Pain Interference with Therapy Activities: 2. Occasionally Pain Interference with Day-to-Day Activities: 2.  Occasionally Home Living/Prior Functioning Home Living Available Help at Discharge: Family;Available 24 hours/day;Neighbor Type of Home: House Home Access: Stairs to enter [employer] of Steps: From carport: 1 small step down then up 1 small step then 1 full step into the home. Entrance Stairs-Rails: None Home Layout: One level Bathroom Shower/Tub: Walk-in shower;Door Bathroom Accessibility: Yes Additional Comments: Pt uses towel/toilet paper holders to transfer off of toilet due to arthiric knees and back pain.  Lives With: Alone Prior Function Level of Independence: Independent with basic ADLs;Independent with homemaking with ambulation Vision/Perception     Cognition Overall Cognitive Status: Within Functional Limits for tasks assessed Arousal/Alertness: Awake/alert Orientation Level: Oriented X4 Attention: Selective Memory: Appears intact Sensation Sensation Light Touch: Impaired by gross assessment (BLE parathesis (distal) 2/2 chemo) Motor  Motor Motor: Within Functional Limits   Trunk/Postural Assessment     Balance   Extremity Assessment      RLE Assessment RLE Assessment: Exceptions to [facility] General Strength Comments: gross 2-/5 RLE Strength RLE Overall Strength: Due to pain;Deficits LLE Assessment LLE Assessment: Within Functional Limits  Care Tool Care Tool Bed Mobility Roll left and right activity   Roll left and right assist level: Minimal Assistance - Patient > 75%    Sit to lying activity   Sit to lying assist level: 2 Helpers    Lying to sitting on side of bed activity   Lying to sitting on side of bed assist level: the ability to move from lying on the back to sitting on the side of the bed with no back support.: Moderate Assistance - Patient 50 - 74%     Care Tool Transfers Sit to stand transfer   Sit to stand assist level: Maximal Assistance - Patient 25 - 49%    Chair/bed transfer   Chair/bed transfer assist level: Maximal  Assistance - Patient 25 - 49%     Toilet transfer  Assist Level: Maximal Assistance - Patient 24 - 49%    Car transfer Car transfer activity did not occur: Safety/medical concerns (limited by pain/activity tolerance)        Care Tool Locomotion Ambulation Ambulation activity did not occur: Safety/medical concerns (limited by pain/activity tolerance)        Walk 10 feet activity Walk 10 feet activity did not occur: Safety/medical concerns (limited by pain/activity tolerance)       Walk 50 feet with 2 turns activity Walk 50 feet with 2 turns activity did not occur: Safety/medical concerns (limited by pain/activity tolerance)      Walk 150 feet activity Walk 150 feet activity did not occur: Safety/medical concerns (limited by pain/activity tolerance)      Walk 10 feet on uneven surfaces activity Walk 10 feet on uneven surfaces activity did not occur: Safety/medical concerns (limited by pain/activity tolerance)      Stairs Stair activity did not occur: Safety/medical concerns (limited by pain/activity tolerance)        Walk up/down 1 step activity Walk up/down 1 step or curb (drop down) activity did not occur: Safety/medical concerns (limited by pain/activity tolerance)      Walk up/down 4 steps activity Walk up/down 4 steps activity did not occur: Safety/medical concerns (limited by pain/activity tolerance)      Walk up/down 12 steps activity Walk up/down 12 steps activity did not occur: Safety/medical concerns (limited by pain/activity tolerance)      Pick up small objects from floor Pick up small object from the floor (from standing position) activity did not occur: Safety/medical concerns (limited by pain/activity tolerance)      Wheelchair Is the patient using a wheelchair?: Yes Type of Wheelchair: Manual        Wheel 50 feet with 2 turns activity      Wheel 150 feet activity        Refer to Care Plan for Mayer Term Goals  SHORT TERM GOAL WEEK 1 PT Short Term  Goal 1 (Week 1): Pt will completed sit to stand with ModA + LRAD PT Short Term Goal 2 (Week 1): Pt will completed SPT with ModA + LRAD PT Short Term Goal 3 (Week 1): Pt will maintain dynamic standing with ModA + LRAD for 5 mins PT Short Term Goal 4 (Week 1): Pt will initiate ambulation activities  Recommendations for other services: None   Skilled Therapeutic Intervention Mobility Transfers Transfers: Sit to Stand;Stand to Sit Sit to Stand: Maximal Assistance - Patient 25-49%;2 Helpers Stand to Sit: 2 Helpers;Moderate Assistance - Patient 50-74% Transfer (Assistive device): Other (Comment) (STEDY) Locomotion  Gait Ambulation: No  Session 1: Chart reviewed and pt agreeable to therapy. Pt received seated in recliner with 1/10 c/o pain in surgical site with no movement. Session focused on evaluation and functional transfers to promote home access. Pt initiated session with evaluation as described above. Pt then requested toileting. Pt required MaxA + 2 + STEDY for stand from recliner and CGA during STEDY transfer to [facility]. Pt required totalA for peri-care. Pt then completed 5 sit to stands in STEDY with CGA + rest breaks for pain. Pt attempted transfer to [facility] using [place] +2. From WC, pt attempted standing, but 2/2 fatigue and pn pt required MaxA +3 + STEDY for sit to stand. Pt returned to bed with use of STEDY and MaxA + 2 for lying to supine. At end of session, pt was left semi-reclined in bed with alarm engaged, nurse call bell and all needs  in reach.      Session 2: Chart reviewed and pt agreeable to therapy. Pt received semi-reclined in bed with no c/o pain. Session focused on functional transfers and pre-gait activities to promote home mobility. Pt initiated session with transfer to EOB using bed features + MinA. Pt then completed sit to stand into STEDY with minA and elevated bed. Pt transferred to [facility] for toileting and required totalA for peri-care. Pt then transferred to [facility] with [place] for  stand to sit. Pt then completed 2 sit to stand at bedside with ModA + handrails. In standing, pt completed 2x 10 lateral weight shifting at slow pace per pt WB tolerance. Pt then completed 2 sit to stands from [facility] using [place] + 2 + RW. Pt given VC for sequencing and progressed to sit to stand with ModA +1 + RW. Pt noted to need LUE on stable surface to promote standing. Pt then found to have bleeding from IV. RN paged to room to address while PT assisted cleaning environment. At end of session, pt was left seated in recliner with alarm engaged, nurse call bell and all needs in reach.    Discharge Criteria: Patient will be discharged from PT if patient refuses treatment 3 consecutive times without medical reason, if treatment goals not met, if there is a change in medical status, if patient makes no progress towards goals or if patient is discharged from hospital.  The above assessment, treatment plan, treatment alternatives and goals were discussed and mutually agreed upon: by patient  Marquette Old 11/30/2022, 12:54 PM

## 2022-11-30 NOTE — Evaluation (Addendum)
Occupational Therapy Assessment and Plan  Patient Details  Name: Terri Mayer MRN: [id number] Date of Birth: 1951/09/09  OT Diagnosis: acute pain, muscle weakness (generalized), and decreased activity tolerance Rehab Potential: Rehab Potential (ACUTE ONLY): Good ELOS: 12-14 days   Today's Date: 11/30/2022 OT Individual Time: 8786-7672 OT Individual Time Calculation (min): 80 min     Hospital Problem: Principal Problem:   Intertrochanteric fracture of right hip [facility])   Past Medical History:  Past Medical History:  Diagnosis Date   Allergy    Breast cancer ([place])    History of chicken pox    Osteoarthritis    Neck,Back,Knees,Hips,Ankles   Personal history of chemotherapy    Personal history of radiation therapy    Past Surgical History:  Past Surgical History:  Procedure Laterality Date   BREAST LUMPECTOMY Right 2017   DILATION AND CURETTAGE OF UTERUS     EYE SURGERY  at age 51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   INTRAMEDULLARY (IM) NAIL INTERTROCHANTERIC Right 11/26/2022   Procedure: INTRAMEDULLARY (IM) NAIL INTERTROCHANTERIC;  Surgeon: Altamese Astor, MD;  Location: [place];  Service: Orthopedics;  Laterality: Right;   PORTACATH PLACEMENT Right 12/11/2015   Procedure: INSERTION PORT-A-CATH WITH ULTRASOUND;  Surgeon: Rolm Bookbinder, MD;  Location: [place];  Service: General;  Laterality: Right;   RADIOACTIVE SEED GUIDED PARTIAL MASTECTOMY/AXILLARY SENTINEL NODE BIOPSY/AXILLARY NODE DISSECTION Right 05/02/2016   Procedure: RIGHT BREAST SEED GUIDED LUMPECTOMY AND RIGHT SEED TARGETED AXILLARY DISSECTION,AND SENTINEL NODE BIOPSY;  Surgeon: Rolm Bookbinder, MD;  Location: [place];  Service: General;  Laterality: Right;  RIGHT BREAST SEED GUIDED LUMPECTOMY AND RIGHT SEED TARGETED AXILLARY DISSECTION,AND SENTINEL NODE BIOPSY   WISDOM TOOTH EXTRACTION      Assessment & Plan Clinical Impression: Patient is a 72 y.o. year old female who presented  11/25/2022 after she reportedly tripped and fell while putting away Christmas decorations.  She denied loss of consciousness.  Noted immediate pain to the right leg.  Patient was down an extended time until found by neighbor. Cranial CT scan as well as CT cervical spine negative. X-rays and imaging showed right comminuted subtrochanteric proximal femur fracture. Admission chemistries unremarkable except WBC 23,200, alcohol negative, lactic acid 3.4-2.4, CK5 54, D-dimer 1.62.  Patient was cleared for surgery underwent intramedullary nailing of right hip 11/26/2022 per Dr. Altamese .  Weightbearing as tolerated right lower extremity. Patient transferred to CIR on 11/29/2022 .    Patient currently requires mod-max A with basic self-care skills secondary to muscle weakness, decreased cardiorespiratoy endurance, and acute pain at surgical site .  Prior to hospitalization, patient could complete ADLs/IADLs with modified independence.  Patient will benefit from skilled intervention to increase independence with basic self-care skills prior to discharge home with care partner.  Anticipate patient will require intermittent supervision and follow up home health.  OT - End of Session Activity Tolerance: Decreased this session Endurance Deficit: Yes Endurance Deficit Description: activity tolerance limited 2/2 pain OT Assessment Rehab Potential (ACUTE ONLY): Good OT Barriers to Discharge: Lack of/limited family support;Weight OT Patient demonstrates impairments in the following area(s): Balance;Pain;Sensory;Endurance OT Basic ADL's Functional Problem(s): Bathing;Dressing;Toileting OT Transfers Functional Problem(s): Toilet;Tub/Shower OT Plan OT Intensity: Minimum of 1-2 x/day, 45 to 90 minutes OT Frequency: 5 out of 7 days OT Duration/Estimated Length of Stay: 12-14 days OT Treatment/Interventions: Balance/vestibular training;Discharge planning;DME/adaptive equipment instruction;Functional mobility  training;Pain management;Patient/family education;Self Care/advanced ADL retraining;Therapeutic Activities;Therapeutic Exercise;UE/LE Strength taining/ROM OT Basic Self-Care Anticipated Outcome(s): SUP OT Toileting Anticipated Outcome(s): SUP  OT Bathroom Transfers Anticipated Outcome(s): SUP OT Recommendation Recommendations for Other Services: Therapeutic Recreation consult Therapeutic Recreation Interventions: Stress management;Pet therapy Patient destination: Home Follow Up Recommendations: Home health OT Equipment Recommended: To be determined  OT Evaluation Precautions/Restrictions  Precautions Precautions: Fall Restrictions Weight Bearing Restrictions: Yes RLE Weight Bearing: Weight bearing as tolerated General Chart Reviewed: Yes Family/Caregiver Present: No Pain Pain Assessment Pain Scale: 0-10 Pain Score: 1  Pain Type: Surgical pain Pain Location: Leg Pain Orientation: Right Home Living/Prior Functioning Home Living Family/patient expects to be discharged to:: Private residence Living Arrangements: Alone, Children, Other relatives Available Help at Discharge: Family, Available 24 hours/day, Neighbor Type of Home: House Home Access: Stairs to enter [employer] of Steps: From carport: 1 small step down then up 1 small step then 1 full step into the home. Entrance Stairs-Rails: None Home Layout: One level Bathroom Shower/Tub: Gaffer, Charity fundraiser:  (Has toilet riser.) Bathroom Accessibility: Yes Additional Comments: Pt uses towel/toilet paper holders to transfer off of toilet due to arthiric knees and back pain.  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 IADL Comments: Son can help with meals, neighbors can help with cleaning, able to hire help. Has to take care of dog. Prior Function Level of Independence: Independent with basic ADLs, Independent with homemaking with ambulation Driving: Yes Vocation: Retired Surveyor, mining Baseline Vision/History: 1 Wears glasses Ability to See in Adequate Light: 0 Adequate Patient Visual Report: No change from baseline Vision Assessment?: No apparent visual deficits Perception  Perception: Within Functional Limits Praxis Praxis: Intact Cognition Cognition Overall Cognitive Status: Within Functional Limits for tasks assessed Arousal/Alertness: Awake/alert Orientation Level: Person;Place;Situation Memory: Appears intact Attention: Selective Safety/Judgment: Appears intact Brief Interview for Mental Status (BIMS) Repetition of Three Words (First Attempt): 3 Temporal Orientation: Year: Correct Temporal Orientation: Month: Accurate within 5 days Temporal Orientation: Day: Correct Recall: "Sock": Yes, no cue required Recall: "Blue": Yes, no cue required Recall: "Bed": Yes, no cue required BIMS Summary Score: 15 Sensation Sensation Light Touch: Impaired Detail Light Touch Impaired Details: Impaired RLE;Impaired LLE Hot/Cold: Appears Intact Proprioception: Impaired Detail Proprioception Impaired Details: Impaired LLE;Impaired RLE Additional Comments: Bilateral neuropathy in feet due to chemo. Coordination Gross Motor Movements are Fluid and Coordinated: No Fine Motor Movements are Fluid and Coordinated: Yes Coordination and Movement Description: Impaired due to generalized weakness/deconditioning. Motor  Motor Motor: Within Functional Limits  Trunk/Postural Assessment  Cervical Assessment Cervical Assessment: Exceptions to [facility] (Forward head) Thoracic  Assessment Thoracic Assessment: Exceptions to [facility] (Rounded shoulders) Lumbar Assessment Lumbar Assessment: Exceptions to [facility] (Posterior pelvic tilt) Postural Control Postural Control: Within Functional Limits  Balance Static Sitting Balance Static Sitting - Balance Support: Bilateral upper extremity supported;Feet supported Static Sitting - Level of Assistance: 5: Stand by assistance Static Standing Balance Static Standing - Balance Support: Bilateral upper extremity supported;During functional activity Static Standing - Level of Assistance: 5: Stand by assistance (CGA) Dynamic Standing Balance Dynamic Standing - Balance Support: Right upper extremity supported;During functional activity Dynamic Standing - Level of Assistance: 4: Min assist Dynamic Standing - Balance Activities: Forward lean/weight shifting Extremity/Trunk Assessment RUE Assessment RUE Assessment: Within Functional Limits LUE Assessment LUE Assessment: Within Functional Limits  Care Tool Care Tool Self Care Eating  Eating Assist Level: Independent with assistive device    Oral Care    Oral Care Assist Level: Set up assist    Bathing   Body parts bathed by patient: Right arm;Left arm;Chest;Abdomen;Front perineal area;Right upper leg;Left upper leg;Face Body parts bathed by helper: Buttocks;Right lower leg;Left lower leg   Assist Level: Minimal Assistance - Patient > 75%    Upper Body Dressing(including orthotics)   What is the patient wearing?: Hospital gown only   Assist Level: Set up assist (Item-retrieval)    Lower Body Dressing (excluding footwear)   What is the patient wearing?: Hospital gown only      Putting on/Taking off footwear   What is the patient wearing?: Non-skid slipper socks Assist for footwear: Dependent - Patient 0%       Care Tool Toileting Toileting activity   Assist for toileting: Minimal Assistance - Patient > 75% (Wearing hospital gown.)     Care Tool Bed Mobility Roll  left and right activity        Sit to lying activity        Lying to sitting on side of bed activity   Lying to sitting on side of bed assist level: the ability to move from lying on the back to sitting on the side of the bed with no back support.: Moderate Assistance - Patient 50 - 74%     Care Tool Transfers Sit to stand transfer   Sit to stand assist level: Maximal Assistance - Patient 25 - 49%    Chair/bed transfer   Chair/bed transfer assist level: Maximal Assistance - Patient 25 - 49%     Toilet transfer   Assist Level: Maximal Assistance - Patient 24 - 49%     Care Tool Cognition  Expression of Ideas and Wants Expression of Ideas and Wants: 4. Without difficulty (complex and basic) - expresses complex messages without difficulty and with speech that is clear and easy to understand  Understanding Verbal and Non-Verbal Content Understanding Verbal and Non-Verbal Content: 4. Understands (complex and basic) - clear comprehension without cues or repetitions   Memory/Recall Ability Memory/Recall Ability : Current season;Location of own room;Staff names and faces;That he or she is in a hospital/hospital unit   Refer to Care Plan for [place] 1 OT Short Term Goal 1 (Week 1): Pt will perform toilet transfer with Mod A + LRAD. OT Short Term Goal 2 (Week 1): Pt will complete LB dressing with Mod A + LRAD. OT Short Term Goal 3 (Week 1): Pt will complete clothing management with Mod A + LRAD during toileting. OT Short Term Goal 4 (Week 1): Pt will complete tub/shower transfer with Mod A + LRAD.  Recommendations for other services: Therapeutic Recreation  Pet therapy and Stress management   Skilled Therapeutic Intervention Pt received resting in bed for skilled OT session with focus on ADL retraining. Pt agreeable to interventions, demonstrating overall pleasant mood. Pt varying levels of pain, stating a 1/10 at rest, with un-rated pain post-activity. OT  offering intermediate rest breaks and positioning suggestions throughout session to address pain/fatigue and maximize participation/safety in session.   Session began with discussion related to role of OT, OT POC, and orientation to rehab unit/schedule. Pt requires increased time to perform supine>EOB, educated on the use of a leg lifter to manage BLEs, needing Mod A for initiating movement of BLEs and lowering of LLE upon coming to EOB.   Pt performs multiple STS transfers this  session, requiring Mod-Max A x2, with increased A required for sit>stand. Pt then performs stand-pivot transfer with increased time + Mod A x2 onto BSC. Pt able to perform anterior peri-care, but required total A for posterior cleaning due to body habitus/standing balance.   Pt transfers onto EOB>recliner with increased A due to fatigue. Pt needing multimodal cuing for safe hand-placement and RW management, as well as, cuing for pursed-lipped breathing during functional transfers.   Pt moving at overall slow pace, benefiting from step-by-step instruction/insight into planned tasks.   Pt remained seated in recliner with all immediate needs met at end of session. Pt continues to be appropriate for skilled OT intervention to promote further functional independence.   ADL ADL Equipment Provided: Leg lifter Eating: Modified independent Where Assessed-Eating: Bed level Upper Body Bathing: Setup Where Assessed-Upper Body Bathing: Edge of bed Lower Body Bathing: Minimal assistance Where Assessed-Lower Body Bathing: Edge of bed Upper Body Dressing: Setup Where Assessed-Upper Body Dressing: Edge of bed Lower Body Dressing: Moderate assistance Where Assessed-Lower Body Dressing: Edge of bed Toileting: Minimal assistance (Wearing hospital gown.) Where Assessed-Toileting: Bedside Commode Toilet Transfer: Moderate assistance Toilet Transfer Method: Stand pivot Science writer: Immunologist: Moderate assistance Social research officer, government Method: Radiographer, therapeutic: Transfer tub bench ADL Comments: Pt greatly impacted by pain, requiring increased time for moving, clinical judgement used for ADL participation. Mobility  Bed Mobility Bed Mobility: Supine to Sit Supine to Sit: Moderate Assistance - Patient 50-74% Transfers Sit to Stand: Maximal Assistance - Patient 25-49%;2 Helpers Stand to Sit: Moderate Assistance - Patient 50-74%;2 Helpers   Discharge Criteria: Patient will be discharged from OT if patient refuses treatment 3 consecutive times without medical reason, if treatment goals not met, if there is a change in medical status, if patient makes no progress towards goals or if patient is discharged from hospital.  The above assessment, treatment plan, treatment alternatives and goals were discussed and mutually agreed upon: by patient  Alessandra Bevels 11/30/2022, 12:33 PM

## 2022-11-30 NOTE — Progress Notes (Signed)
PROGRESS NOTE   Subjective/Complaints: Pt doing well this morning, pain is tolerable. Believes she received a dose of Oxycodone at 6:50am but not documented on nursing side. Wants to stay on top of pain in order to be able to perform PT. Slept ok, no nightmares last night, actually had a good dream this morning.  Hasn't had a BM in 6 days since the incident-- states she had large expulsion of BMs while on ground for 22hrs so doesn't feel like there's much left now. Doesn't feel constipated. Not eating solids great but trying, drinking protein shakes and eating fruit. Having gas. Denies issues with this currently.  Denies any other complaints today.   ROS: Pt denies CP, SOB, abd pain, n/v/d, urinary symptoms, or other complaints.    Objective:   No results found. Recent Labs    11/28/22 0335  WBC 11.0*  HGB 10.6*  HCT 32.4*  PLT 262   Recent Labs    11/28/22 0335  NA 138  K 3.9  CL 104  CO2 26  GLUCOSE 100*  BUN 35*  CREATININE 1.06*  CALCIUM 8.9    Intake/Output Summary (Last 24 hours) at 11/30/2022 1256 Last data filed at 11/30/2022 0448 Gross per 24 hour  Intake 480 ml  Output 1750 ml  Net -1270 ml        Physical Exam: Vital Signs Blood pressure 126/79, pulse 99, temperature 98.1 F (36.7 C), resp. rate 15, height '5\' 6"'$  (1.676 m), weight 113.6 kg, SpO2 92 %.  Constitutional: No apparent distress. Appropriate appearance for age. +Obese HENT: No JVD. Neck Supple. Trachea midline. Atraumatic, normocephalic. +glasses Eyes: PERRLA. EOMI. Visual fields grossly intact.  Cardiovascular: RRR, no murmurs/rub/gallops. No Edema. Peripheral pulses 2+  Respiratory: CTAB. No rales, rhonchi, or wheezing. On RA.  Abdomen: + bowel sounds, normoactive. No distention or tenderness.  GU: Not examined.   Skin:  No apparent lesions. Mepilex dressings on right distal thigh and proximal hip, with large surgical dressing  overlying suture. All clean, dry, intact. Dry dressing intact to right hip. Sutures intact-not assessed, dressings without drainage. Appropriately tender  MSK:      Mild swelling around R thigh with some dark bruising discoloration.       Significant hip pain with attempted ROM of both the L and R thighs      Strength:                RUE: 5/5 SA, 5/5 EF, 5/5 EE, 5/5 WE, 5/5 FF, 5/5 FA                 LUE: 5/5 SA, 5/5 EF, 5/5 EE, 5/5 WE, 5/5 FF, 5/5 FA                 RLE: 2-/5 HF, 2+/5 KE, 5/5 DF, 5/5 EHL, 5/5 PF                 LLE:  2+/5 HF, 3+/5 KE, 5/5 DF, 5/5 EHL, 5/5 PF    Neurologic exam: Sitting up in bed.  Makes eye contact with examiner.  Follows full commands. Cognition: AAO to person, place, time and event.  Language: Fluent,  No substitutions or neoglisms. No dysarthria. Names 3/3 objects correctly.  Memory: Recalls 3/3 objects at 5 minutes. No apparent deficits  Insight: Good insight into current condition.  Mood: Pleasant affect, appropriate mood.  Sensation: To light touch reduced in bilateral feet and ankles Reflexes: 2+ in BL UE and LEs. Negative Hoffman's and babinski signs bilaterally.  CN: 2-12 grossly intact.  Coordination: No apparent tremors.  Spasticity: MAS 0 in all extremities.       Assessment/Plan: 1. Functional deficits which require 3+ hours per day of interdisciplinary therapy in a comprehensive inpatient rehab setting. Physiatrist is providing close team supervision and 24 hour management of active medical problems listed below. Physiatrist and rehab team continue to assess barriers to discharge/monitor patient progress toward functional and medical goals  Care Tool:  Bathing    Body parts bathed by patient: Right arm, Left arm, Chest, Abdomen, Front perineal area, Right upper leg, Left upper leg, Face   Body parts bathed by helper: Buttocks, Right lower leg, Left lower leg     Bathing assist Assist Level: Minimal Assistance - Patient > 75%      Upper Body Dressing/Undressing Upper body dressing   What is the patient wearing?: Hospital gown only    Upper body assist Assist Level: Set up assist (Item-retrieval)    Lower Body Dressing/Undressing Lower body dressing      What is the patient wearing?: Hospital gown only     Lower body assist       Toileting Toileting    Toileting assist Assist for toileting: Minimal Assistance - Patient > 75% (Wearing hospital gown.)     Transfers Chair/bed transfer  Transfers assist     Chair/bed transfer assist level: Maximal Assistance - Patient 25 - 49%     Locomotion Ambulation   Ambulation assist   Ambulation activity did not occur: Safety/medical concerns (limited by pain/activity tolerance)          Walk 10 feet activity   Assist  Walk 10 feet activity did not occur: Safety/medical concerns (limited by pain/activity tolerance)        Walk 50 feet activity   Assist Walk 50 feet with 2 turns activity did not occur: Safety/medical concerns (limited by pain/activity tolerance)         Walk 150 feet activity   Assist Walk 150 feet activity did not occur: Safety/medical concerns (limited by pain/activity tolerance)         Walk 10 feet on uneven surface  activity   Assist Walk 10 feet on uneven surfaces activity did not occur: Safety/medical concerns (limited by pain/activity tolerance)         Wheelchair     Assist Is the patient using a wheelchair?: Yes Type of Wheelchair: Manual           Wheelchair 50 feet with 2 turns activity    Assist            Wheelchair 150 feet activity     Assist          Blood pressure 126/79, pulse 99, temperature 98.1 F (36.7 C), resp. rate 15, height '5\' 6"'$  (1.676 m), weight 113.6 kg, SpO2 92 %.  Medical Problem List and Plan: 1. Functional deficits secondary to right comminuted subtrochanteric proximal femur fracture.  Status post intramedullary nailing of right hip  11/26/2022 (Dr. Marcelino Scot).  Weightbearing as tolerated with walker, unrestricted ROM R hip/knee, f/up outpatient with ortho in osteoporosis clinic             -  patient may shower             -ELOS/Goals: 10-14 days, SPV PT/OT 2.  Antithrombotics: -DVT/anticoagulation:  Pharmaceutical: Eliquis 2.'5mg'$  BID - ppx for 30 days post-op (11/26/22-12/26/22).  Check vascular study (DVT U/S neg 11/26/22) -antiplatelet therapy: N/A 3. Pain Management: Tylenol 325-'650mg'$  q6h PRN, Oxycodone 5-'10mg'$  q4h PRN and Robaxin '500mg'$  q6h PRN -Chronic chemotherapy induced peripheral neuropathy - stable, no additional medications req -11/30/22 pain doing ok, continue regimen and monitor 4. Mood/Behavior/Sleep: Provide emotional support -antipsychotic agents: N/A 5. Neuropsych/cognition: This patient is capable of making decisions on her own behalf. 6. Skin/Wound Care: Routine skin checks -11/30/22 Spoke with Ainsley Spinner PA-C for OTS, states can have daily dressing changes per nursing staff here, ok to shower and clean wounds with soap and water--ordered   7. Fluids/Electrolytes/Nutrition: Routine in and outs with follow-up chemistries (weekly labs starting 12/02/22)  -Continue Vitamin C '1000mg'$  QD and vitamin D 2000IU BID -11/30/22 Monitor SCr, last value 1.06 on 11/28/22, encourage fluids and monitor  8.  Acute blood loss anemia.  Hgb 10.6 on 11/28/22. Follow-up CBC on weekly labs starting 12/02/22  9.  Morbid obesity.  BMI 38.94.  Dietary follow-up 10.  History of right breast cancer.  Follow-up outpatient. 11.  Constipation:  Colace BID, MiraLAX 17g QD PRN  -Per patient, LBM prior to admission during incident -Increase regimen to [place]-S 1 tab BID + miralax 17g QD, PRN MoM -11/30/22 doesn't feel constipated, but no BM since date of incident (11/24/22); added back Miralax 17g PRN in addition to QD dosing, added Fleet enema QD PRN and Sorbitol 70% 30-2m QD PRN; monitor    [place]: [place]24, 12:56 PM

## 2022-11-30 NOTE — Progress Notes (Signed)
BLE venous duplex has been completed.   Results can be found under chart review under CV PROC. 11/30/2022 5:29 PM Jeziah Kretschmer RVT, RDMS

## 2022-12-01 MED ORDER — ACETAMINOPHEN 325 MG PO TABS
325.0000 mg | ORAL_TABLET | ORAL | Status: DC | PRN
  Administered 2022-12-01 – 2022-12-02 (×6): 650 mg via ORAL
  Filled 2022-12-01 (×5): qty 2

## 2022-12-01 NOTE — Progress Notes (Signed)
PROGRESS NOTE   Subjective/Complaints: Pt doing very well this morning. Had a BM this morning and is very happy with this. Pain well controlled, did well in PT yesterday and has a day off today-- wants to try to use Tylenol today rather than narcotics, but knows they are available PRN. Slept ok. Denies any other complaints today.   ROS: Pt denies CP, SOB, cough, abd pain, n/v/d/c, urinary symptoms, or other complaints.    Objective:   VAS [place] LOWER EXTREMITY VENOUS (DVT)  Result Date: 11/30/2022  Lower Venous DVT Study Patient Name:  Terri Mayer  Date of Exam:   11/30/2022 Medical Rec #: [id number]           Accession #:    [id number] Date of Birth: 1950/12/25           Patient Gender: F Patient Age:   72 years Exam Location:  [facility] Procedure:      VAS [place] LOWER EXTREMITY VENOUS (DVT) Referring Phys: Lauraine Rinne --------------------------------------------------------------------------------  Indications: Rehab patient - "swelling".  Anticoagulation: Eliquis - post sx. Limitations: Poor ultrasound/tissue interface. Comparison Study: Previous exam on 11/26/22 negative for DVT Performing Technologist: Jody Hill RVT, RDMS  Examination Guidelines: A complete evaluation includes B-mode imaging, spectral Doppler, color Doppler, and power Doppler as needed of all accessible portions of each vessel. Bilateral testing is considered an integral part of a complete examination. Limited examinations for reoccurring indications may be performed as noted. The reflux portion of the exam is performed with the patient in reverse Trendelenburg.  +---------+---------------+---------+-----------+----------+-------------------+ RIGHT    CompressibilityPhasicitySpontaneityPropertiesThrombus Aging      +---------+---------------+---------+-----------+----------+-------------------+ CFV      Full           Yes      Yes                                       +---------+---------------+---------+-----------+----------+-------------------+ SFJ      Full                                                             +---------+---------------+---------+-----------+----------+-------------------+ FV Prox  Full           Yes      Yes                                      +---------+---------------+---------+-----------+----------+-------------------+ FV Mid   Full           Yes      Yes                                      +---------+---------------+---------+-----------+----------+-------------------+ FV DistalFull           Yes  Yes                                      +---------+---------------+---------+-----------+----------+-------------------+ PFV      Full                                                             +---------+---------------+---------+-----------+----------+-------------------+ POP      Full           Yes      Yes                                      +---------+---------------+---------+-----------+----------+-------------------+ PTV      Full                                                             +---------+---------------+---------+-----------+----------+-------------------+ PERO                                                  Not well visualized +---------+---------------+---------+-----------+----------+-------------------+   +---------+---------------+---------+-----------+----------+--------------+ LEFT     CompressibilityPhasicitySpontaneityPropertiesThrombus Aging +---------+---------------+---------+-----------+----------+--------------+ CFV      Full           Yes      Yes                                 +---------+---------------+---------+-----------+----------+--------------+ SFJ      Full                                                        +---------+---------------+---------+-----------+----------+--------------+ FV Prox  Full            Yes      Yes                                 +---------+---------------+---------+-----------+----------+--------------+ FV Mid   Full           Yes      Yes                                 +---------+---------------+---------+-----------+----------+--------------+ FV DistalFull           Yes      Yes                                 +---------+---------------+---------+-----------+----------+--------------+ PFV      Full                                                        +---------+---------------+---------+-----------+----------+--------------+  POP      Full                                                        +---------+---------------+---------+-----------+----------+--------------+ PTV      Full                                                        +---------+---------------+---------+-----------+----------+--------------+ PERO                                                  Acute          +---------+---------------+---------+-----------+----------+--------------+     Summary: BILATERAL: - No evidence of deep vein thrombosis seen in the lower extremities, bilaterally. -No evidence of popliteal cyst, bilaterally.   *See table(s) above for measurements and observations. Electronically signed by Harold Barban MD on 11/30/2022 at 6:48:04 PM.    Final    No results for input(s): "WBC", "HGB", "HCT", "PLT" in the last 72 hours.  No results for input(s): "NA", "K", "CL", "CO2", "GLUCOSE", "BUN", "CREATININE", "CALCIUM" in the last 72 hours.   Intake/Output Summary (Last 24 hours) at 12/01/2022 0708 Last data filed at 12/01/2022 0530 Gross per 24 hour  Intake 120 ml  Output 1200 ml  Net -1080 ml         Physical Exam: Vital Signs Blood pressure (!) 133/58, pulse 91, temperature 97.6 F (36.4 C), temperature source Oral, resp. rate 18, height '5\' 6"'$  (1.676 m), weight 113.6 kg, SpO2 95 %.  Constitutional: No apparent distress. Appropriate  appearance for age. +Obese HENT: No JVD. Neck Supple. Trachea midline. Atraumatic, normocephalic. +glasses Eyes: PERRLA. EOMI. Visual fields grossly intact.  Cardiovascular: RRR, no murmurs/rub/gallops. No Edema. Peripheral pulses 2+  Respiratory: CTAB. No rales, rhonchi, or wheezing. On RA.  Abdomen: + bowel sounds, normoactive. No distention or tenderness.  GU: Not examined.   Skin:  No apparent lesions. Mepilex dressings on right distal thigh and proximal hip, with large surgical dressing overlying suture. All clean, dry, intact. Dry dressing intact to right hip. Sutures intact-not assessed, dressings without drainage. Asked nursing to change dressings today. Appropriately tender  MSK:      Mild swelling around R thigh with some dark bruising discoloration.       Significant hip pain with attempted ROM of both the L and R thighs      Strength:                RUE: 5/5 SA, 5/5 EF, 5/5 EE, 5/5 WE, 5/5 FF, 5/5 FA                 LUE: 5/5 SA, 5/5 EF, 5/5 EE, 5/5 WE, 5/5 FF, 5/5 FA                 RLE: 2-/5 HF, 2+/5 KE, 5/5 DF, 5/5 EHL, 5/5 PF                 LLE:  2+/5 HF, 3+/5 KE, 5/5 DF,  5/5 EHL, 5/5 PF    Neurologic exam: Sitting up in bed.  Makes eye contact with examiner.  Follows full commands. Cognition: AAO to person, place, time and event.  Language: Fluent, No substitutions or neoglisms. No dysarthria. Names 3/3 objects correctly.  Memory: Recalls 3/3 objects at 5 minutes. No apparent deficits  Insight: Good insight into current condition.  Mood: Pleasant affect, appropriate mood.  Sensation: To light touch reduced in bilateral feet and ankles Reflexes: 2+ in BL UE and LEs. Negative Hoffman's and babinski signs bilaterally.  CN: 2-12 grossly intact.  Coordination: No apparent tremors.  Spasticity: MAS 0 in all extremities.       Assessment/Plan: 1. Functional deficits which require 3+ hours per day of interdisciplinary therapy in a comprehensive inpatient rehab  setting. Physiatrist is providing close team supervision and 24 hour management of active medical problems listed below. Physiatrist and rehab team continue to assess barriers to discharge/monitor patient progress toward functional and medical goals  Care Tool:  Bathing    Body parts bathed by patient: Right arm, Left arm, Chest, Abdomen, Front perineal area, Right upper leg, Left upper leg, Face   Body parts bathed by helper: Buttocks, Right lower leg, Left lower leg     Bathing assist Assist Level: Minimal Assistance - Patient > 75%     Upper Body Dressing/Undressing Upper body dressing   What is the patient wearing?: Hospital gown only    Upper body assist Assist Level: Set up assist (Item-retrieval)    Lower Body Dressing/Undressing Lower body dressing      What is the patient wearing?: Hospital gown only     Lower body assist       Toileting Toileting    Toileting assist Assist for toileting: Minimal Assistance - Patient > 75% (Wearing hospital gown.)     Transfers Chair/bed transfer  Transfers assist     Chair/bed transfer assist level: Maximal Assistance - Patient 25 - 49%     Locomotion Ambulation   Ambulation assist   Ambulation activity did not occur: Safety/medical concerns (limited by pain/activity tolerance)          Walk 10 feet activity   Assist  Walk 10 feet activity did not occur: Safety/medical concerns (limited by pain/activity tolerance)        Walk 50 feet activity   Assist Walk 50 feet with 2 turns activity did not occur: Safety/medical concerns (limited by pain/activity tolerance)         Walk 150 feet activity   Assist Walk 150 feet activity did not occur: Safety/medical concerns (limited by pain/activity tolerance)         Walk 10 feet on uneven surface  activity   Assist Walk 10 feet on uneven surfaces activity did not occur: Safety/medical concerns (limited by pain/activity tolerance)          Wheelchair     Assist Is the patient using a wheelchair?: Yes Type of Wheelchair: Manual Wheelchair activity did not occur: Safety/medical concerns (fatigue/endurance/pain)         Wheelchair 50 feet with 2 turns activity    Assist    Wheelchair 50 feet with 2 turns activity did not occur: Safety/medical concerns       Wheelchair 150 feet activity     Assist  Wheelchair 150 feet activity did not occur: Safety/medical concerns       Blood pressure (!) 133/58, pulse 91, temperature 97.6 F (36.4 C), temperature source Oral, resp. rate 18, height 5'  6" (1.676 m), weight 113.6 kg, SpO2 95 %.  Medical Problem List and Plan: 1. Functional deficits secondary to right comminuted subtrochanteric proximal femur fracture.  Status post intramedullary nailing of right hip 11/26/2022 (Dr. Marcelino Scot).  Weightbearing as tolerated with walker, unrestricted ROM R hip/knee, f/up outpatient with ortho in osteoporosis clinic             -patient may shower             -ELOS/Goals: 10-14 days, SPV PT/OT 2.  Antithrombotics: -DVT/anticoagulation:  Pharmaceutical: Eliquis 2.'5mg'$  BID - ppx for 30 days post-op (11/26/22-12/26/22).   -12/01/22 Dopplers negative for DVT -antiplatelet therapy: N/A 3. Pain Management: Tylenol 325-'650mg'$  q6h PRN, Oxycodone 5-'10mg'$  q4h PRN and Robaxin '500mg'$  q6h PRN -Chronic chemotherapy induced peripheral neuropathy - stable, no additional medications req -12/01/22 pain doing ok, continue regimen and monitor 4. Mood/Behavior/Sleep: Provide emotional support -antipsychotic agents: N/A 5. Neuropsych/cognition: This patient is capable of making decisions on her own behalf. 6. Skin/Wound Care: Routine skin checks -11/30/22 Spoke with Ainsley Spinner PA-C for OTS, states can have daily dressing changes per nursing staff here, ok to shower and clean wounds with soap and water--ordered -12/01/22 made nursing aware of dressing change order daily   7. Fluids/Electrolytes/Nutrition:  Routine in and outs with follow-up chemistries (weekly labs starting 12/02/22)  -Continue Vitamin C '1000mg'$  QD and vitamin D 2000IU BID -11/30/22 Monitor SCr, last value 1.06 on 11/28/22, encourage fluids and monitor  8.  Acute blood loss anemia.  Hgb 10.6 on 11/28/22. Follow-up CBC on weekly labs starting 12/02/22  9.  Morbid obesity.  BMI 38.94.  Dietary follow-up 10.  History of right breast cancer.  Follow-up outpatient. 11.  Constipation:  Colace BID, MiraLAX 17g QD PRN  -Per patient, LBM prior to admission during incident -Increase regimen to Senn[place]ot-S 1 tab BID + miralax 17g QD, PRN MoM -11/30/22 doesn't feel constipated, but no BM since date of incident (11/24/22); added back Miralax 17g PRN in addition to QD dosing, added Fleet enema QD PRN and Sorbitol 70% 30-60m QD PRN; monitor -12/01/22 BM today! Continue current regimen.     LOS: 2 days A FACE TO FMaquon1/14/2024, 7:08 AM

## 2022-12-01 NOTE — Discharge Instructions (Addendum)
Inpatient Rehab Discharge Instructions  Terri Mayer Discharge date and time: No discharge date for patient encounter.   Activities/Precautions/ Functional Status: Activity: As tolerated Diet: Regular Wound Care: Routine skin checks Functional status:  ___ No restrictions     ___ Walk up steps independently ___ 24/7 supervision/assistance   ___ Walk up steps with assistance ___ Intermittent supervision/assistance  ___ Bathe/dress independently ___ Walk with walker     _x__ Bathe/dress with assistance ___ Walk Independently    ___ Shower independently ___ Walk with assistance    ___ Shower with assistance ___ No alcohol     ___ Return to work/school ________  Special Instructions: No driving smoking or alcohol   COMMUNITY REFERRALS UPON DISCHARGE:    Home Health:   PT  & OT                  Agency:[facility]    ALPFX:[phone]    Medical Equipment/Items Ordered:ROLLING WALKER, TUB BENCH AND 3 IN 1                                                 Agency/Supplier:[facility]   [phone]    My questions have been answered and I understand these instructions. I will adhere to these goals and the provided educational materials after my discharge from the hospital.  Patient/Caregiver Signature _______________________________ Date __________  Clinician Signature _______________________________________ Date __________  Please bring this form and your medication list with you to all your follow-up doctor's appointments.    Information on my medicine - ELIQUIS (apixaban)  Why was Eliquis prescribed for you? Eliquis was prescribed for you to reduce the risk of blood clots forming after orthopedic surgery.    What do You need to know about Eliquis? Take your Eliquis TWICE DAILY - one tablet in the morning and one tablet in the evening with or without food.  It would be best to take the dose about the same time each day.  If you have difficulty swallowing  the tablet whole please discuss with your pharmacist how to take the medication safely.  Take Eliquis exactly as prescribed by your doctor and DO NOT stop taking Eliquis without talking to the doctor who prescribed the medication.  Stopping without other medication to take the place of Eliquis may increase your risk of developing a clot.  After discharge, you should have regular check-up appointments with your healthcare provider that is prescribing your Eliquis.  What do you do if you miss a dose? If a dose of ELIQUIS is not taken at the scheduled time, take it as soon as possible on the same day and twice-daily administration should be resumed.  The dose should not be doubled to make up for a missed dose.  Do not take more than one tablet of ELIQUIS at the same time.  Important Safety Information A possible side effect of Eliquis is bleeding. You should call your healthcare provider right away if you experience any of the following: Bleeding from an injury or your nose that does not stop. Unusual colored urine (red or dark brown) or unusual colored stools (red or black). Unusual bruising for unknown reasons. A serious fall or if you hit your head (even if there is no bleeding).  Some medicines may interact with Eliquis and might increase your risk of bleeding or clotting  while on Eliquis. To help avoid this, consult your healthcare provider or pharmacist prior to using any new prescription or non-prescription medications, including herbals, vitamins, non-steroidal anti-inflammatory drugs (NSAIDs) and supplements.  This website has more information on Eliquis (apixaban): http://www.eliquis.com/eliquis/home

## 2022-12-01 NOTE — Progress Notes (Signed)
Occupational Therapy Session Note  Patient Details  Name: Terri Mayer MRN: [id number] Date of Birth: 01/08/1951  {CHL IP REHAB OT TIME CALCULATIONS:[id number]}   Short Term Goals: Week 1:  OT Short Term Goal 1 (Week 1): Pt will perform toilet transfer with Mod A + LRAD. OT Short Term Goal 2 (Week 1): Pt will complete LB dressing with Mod A + LRAD. OT Short Term Goal 3 (Week 1): Pt will complete clothing management with Mod A + LRAD during toileting. OT Short Term Goal 4 (Week 1): Pt will complete tub/shower transfer with Mod A + LRAD.  Skilled Therapeutic Interventions/Progress Updates:   Session 1: Pt received *** for skilled OT session with focus on ***. Pt agreeable to interventions, demonstrating overall *** mood. Pt reported ***/10 pain, stating "***" in reference to ***. OT offering intermediate rest breaks and positioning suggestions throughout session to address pain/fatigue and maximize participation/safety in session.    Pt remained *** with all immediate needs met at end of session. Pt continues to be appropriate for skilled OT intervention to promote further functional independence.   Session 2: Pt received *** for skilled OT session with focus on ***. Pt agreeable to interventions, demonstrating overall *** mood. Pt reported ***/10 pain, stating "***" in reference to ***. OT offering intermediate rest breaks and positioning suggestions throughout session to address pain/fatigue and maximize participation/safety in session.    Pt remained *** with all immediate needs met at end of session. Pt continues to be appropriate for skilled OT intervention to promote further functional independence.   Therapy Documentation Precautions:  Precautions Precautions: Fall Restrictions Weight Bearing Restrictions: Yes RLE Weight Bearing: Weight bearing as tolerated   Therapy/Group: Individual Therapy  Maudie Mercury, OTR/L, MSOT  12/01/2022, 12:41 PM

## 2022-12-02 DIAGNOSIS — M79604 Pain in right leg: Secondary | ICD-10-CM

## 2022-12-02 DIAGNOSIS — D62 Acute posthemorrhagic anemia: Secondary | ICD-10-CM

## 2022-12-02 DIAGNOSIS — K5900 Constipation, unspecified: Secondary | ICD-10-CM

## 2022-12-02 DIAGNOSIS — R799 Abnormal finding of blood chemistry, unspecified: Secondary | ICD-10-CM

## 2022-12-02 LAB — CBC WITH DIFFERENTIAL/PLATELET
Abs Immature Granulocytes: 0.07 10*3/uL (ref 0.00–0.07)
Basophils Absolute: 0 10*3/uL (ref 0.0–0.1)
Basophils Relative: 0 %
Eosinophils Absolute: 0.3 10*3/uL (ref 0.0–0.5)
Eosinophils Relative: 4 %
HCT: 37.9 % (ref 36.0–46.0)
Hemoglobin: 12 g/dL (ref 12.0–15.0)
Immature Granulocytes: 1 %
Lymphocytes Relative: 21 %
Lymphs Abs: 1.6 10*3/uL (ref 0.7–4.0)
MCH: 26.7 pg (ref 26.0–34.0)
MCHC: 31.7 g/dL (ref 30.0–36.0)
MCV: 84.2 fL (ref 80.0–100.0)
Monocytes Absolute: 0.8 10*3/uL (ref 0.1–1.0)
Monocytes Relative: 10 %
Neutro Abs: 4.7 10*3/uL (ref 1.7–7.7)
Neutrophils Relative %: 64 %
Platelets: 404 10*3/uL — ABNORMAL HIGH (ref 150–400)
RBC: 4.5 MIL/uL (ref 3.87–5.11)
RDW: 13.9 % (ref 11.5–15.5)
WBC: 7.5 10*3/uL (ref 4.0–10.5)
nRBC: 0 % (ref 0.0–0.2)

## 2022-12-02 LAB — COMPREHENSIVE METABOLIC PANEL
ALT: 19 U/L (ref 0–44)
AST: 25 U/L (ref 15–41)
Albumin: 2.9 g/dL — ABNORMAL LOW (ref 3.5–5.0)
Alkaline Phosphatase: 49 U/L (ref 38–126)
Anion gap: 8 (ref 5–15)
BUN: 24 mg/dL — ABNORMAL HIGH (ref 8–23)
CO2: 30 mmol/L (ref 22–32)
Calcium: 9.4 mg/dL (ref 8.9–10.3)
Chloride: 100 mmol/L (ref 98–111)
Creatinine, Ser: 0.8 mg/dL (ref 0.44–1.00)
GFR, Estimated: >60 mL/min (ref >60)
Glucose, Bld: 108 mg/dL — ABNORMAL HIGH (ref 70–99)
Potassium: 4.2 mmol/L (ref 3.5–5.1)
Sodium: 138 mmol/L (ref 135–145)
Total Bilirubin: 1.2 mg/dL (ref 0.3–1.2)
Total Protein: 6.1 g/dL — ABNORMAL LOW (ref 6.5–8.1)

## 2022-12-02 MED ORDER — ACETAMINOPHEN 325 MG PO TABS
650.0000 mg | ORAL_TABLET | ORAL | Status: DC
  Administered 2022-12-02 – 2022-12-12 (×56): 650 mg via ORAL
  Filled 2022-12-02 (×59): qty 2

## 2022-12-02 MED ORDER — ACETAMINOPHEN 325 MG PO TABS: 650.0000 mg | ORAL_TABLET | ORAL | Status: DC | PRN

## 2022-12-02 MED ORDER — ACETAMINOPHEN 325 MG PO TABS: 650.0000 mg | ORAL_TABLET | Freq: Four times a day (QID) | ORAL | Status: DC

## 2022-12-02 MED ORDER — SENNOSIDES-DOCUSATE SODIUM 8.6-50 MG PO TABS
1.0000 | ORAL_TABLET | Freq: Every day | ORAL | Status: DC
  Administered 2022-12-03 – 2022-12-04 (×2): 1 via ORAL
  Filled 2022-12-02 (×2): qty 1

## 2022-12-02 NOTE — Progress Notes (Addendum)
Met with patient. Oriented to rehab. Patient very upset. Reports that Tylenol was stopped. Reviewed chart and I explained that Tylenol is scheduled every 6 hours.  She reports that needs every 4 hours and then only taking the oxy before/during therapy to progress but that doesn't want to take it all the time because doesn't want to get addicted to medication.  Patient reports that if given '650mg'$  every 4 hours that will not exceed the recommended '4000mg'$ . Liver enzymes okay. RN aware. Notified MD and PA of patient concerns. RN informed that PA aware. Discussed femur fracture and WBAT. Discussed Eliquis x 30 days. Discussed increasing foods that are high in protein, increase in Ca and Vit D.  Patient reports that drinking milk and eating yogurt. Discussed importance of increasing proteins as well. Discussed team meeting every Tuesday and will discuss discharge date, goals, barriers, and equipment if needed. Will discuss OT/PT after discharge. Reports that still going home alone. Sister lives in [place] and is self employed. Sister will be able to stay with her but most likely not more than a week. Has a son but works and she has told him not to take off while in hospital because he may need to stay with her or at least be able to assist her. Still trying to work things out for discharge at this point. Informed that discharge will be between 9-11 and PA will go over discharge paperwork, follow ups and medications. She said she is aware, she is 20, obese, and not in the best shape. All needs met, call bell in reach.       Tylenol moved to '650mg'$  Q 4 hours. Informed patient but also explained that will be for limited amount of days. Patient upset but understands. Informed that will cross that bridge when/if tylenol moved back to Q 6.  She reports that will try wait 6 hours. Reported again that is every 4 at this time and scheduled.

## 2022-12-02 NOTE — Progress Notes (Signed)
Inpatient Rehabilitation  Patient information reviewed and entered into eRehab system by Yuritza Paulhus M. Giliana Vantil, M.A., CCC/SLP, PPS Coordinator.  Information including medical coding, functional ability and quality indicators will be reviewed and updated through discharge.    

## 2022-12-02 NOTE — Progress Notes (Signed)
Physical Therapy Session Note  Patient Details  Name: Terri Mayer MRN: [id number] Date of Birth: [birth year]  Today's Date: 12/02/2022 PT Individual Time: 1000-1100 PT Individual Time Calculation (min): 60 min   Short Term Goals: Week 1:  PT Short Term Goal 1 (Week 1): Pt will completed sit to stand with ModA + LRAD PT Short Term Goal 2 (Week 1): Pt will completed SPT with ModA + LRAD PT Short Term Goal 3 (Week 1): Pt will maintain dynamic standing with ModA + LRAD for 5 mins PT Short Term Goal 4 (Week 1): Pt will initiate ambulation activities  Skilled Therapeutic Interventions/Progress Updates:    Chart reviewed and pt agreeable to therapy. Pt received seated in WC with 3/10 c/o pain at surgical site. Also of note, pt c/o discomfort in WC. Session focused on functional transfers to promote independent mobility. Pt initiated session with sit to stand using MinA + RW + VC for sequencing, and then completed 1x10 lateral weight shifting with CGA + RW for balance. PT then assisted pt with WC adaptions to minimize pain, requiring significant time. Pt then request toileting. Pt transferred to toilet with CGA + STEDY for stand and dependent transfer to toilet in [place]. Pt required totalA for per-care. Pt then returned to bed in STEDY and completed 3x sit to stand with CGA + STEDY and 10 lateral weight shifts per round. Pt required MinA for sit to supine. At end of session, pt was left semi-reclined in bed with alarm engaged, nurse call bell and all needs in reach.     Therapy Documentation Precautions:  Precautions Precautions: Fall Restrictions Weight Bearing Restrictions: Yes RLE Weight Bearing: Weight bearing as tolerated General:      Therapy/Group: Individual Therapy  Marquette Old, PT, DPT 12/02/2022, 12:27 PM

## 2022-12-02 NOTE — Progress Notes (Addendum)
PROGRESS NOTE   Subjective/Complaints: Terri Mayer would like tylenol scheduled, Terri Mayer is trying to use this to reduce oxycodone use.  Terri Mayer had some abdominal fullness that improved with BM yesterday.  Terri Mayer thinks the senokot-S might be causing some bloating at night so Terri Mayer declined the PM dose yesterday.   ROS: Pt denies CP, SOB, cough, abd pain, n/v/d/c, new rash, urinary symptoms, or other complaints.    Objective:   VAS [place] LOWER EXTREMITY VENOUS (DVT)  Result Date: 11/30/2022  Lower Venous DVT Study Patient Name:  Terri Mayer  Date of Exam:   11/30/2022 Medical Rec #: [id number]           Accession #:    [id number] Date of Birth: 04/03/1951           Patient Gender: F Patient Age:   72 years Exam Location:  [facility] Procedure:      VAS [place] LOWER EXTREMITY VENOUS (DVT) Referring Phys: Lauraine Rinne --------------------------------------------------------------------------------  Indications: Rehab patient - "swelling".  Anticoagulation: Eliquis - post sx. Limitations: Poor ultrasound/tissue interface. Comparison Study: Previous exam on 11/26/22 negative for DVT Performing Technologist: Jody Hill RVT, RDMS  Examination Guidelines: A complete evaluation includes B-mode imaging, spectral Doppler, color Doppler, and power Doppler as needed of all accessible portions of each vessel. Bilateral testing is considered an integral part of a complete examination. Limited examinations for reoccurring indications may be performed as noted. The reflux portion of the exam is performed with the patient in reverse Trendelenburg.  +---------+---------------+---------+-----------+----------+-------------------+ RIGHT    CompressibilityPhasicitySpontaneityPropertiesThrombus Aging      +---------+---------------+---------+-----------+----------+-------------------+ CFV      Full           Yes      Yes                                       +---------+---------------+---------+-----------+----------+-------------------+ SFJ      Full                                                             +---------+---------------+---------+-----------+----------+-------------------+ FV Prox  Full           Yes      Yes                                      +---------+---------------+---------+-----------+----------+-------------------+ FV Mid   Full           Yes      Yes                                      +---------+---------------+---------+-----------+----------+-------------------+ FV DistalFull           Yes      Yes                                      +---------+---------------+---------+-----------+----------+-------------------+  PFV      Full                                                             +---------+---------------+---------+-----------+----------+-------------------+ POP      Full           Yes      Yes                                      +---------+---------------+---------+-----------+----------+-------------------+ PTV      Full                                                             +---------+---------------+---------+-----------+----------+-------------------+ PERO                                                  Not well visualized +---------+---------------+---------+-----------+----------+-------------------+   +---------+---------------+---------+-----------+----------+--------------+ LEFT     CompressibilityPhasicitySpontaneityPropertiesThrombus Aging +---------+---------------+---------+-----------+----------+--------------+ CFV      Full           Yes      Yes                                 +---------+---------------+---------+-----------+----------+--------------+ SFJ      Full                                                        +---------+---------------+---------+-----------+----------+--------------+ FV Prox  Full           Yes       Yes                                 +---------+---------------+---------+-----------+----------+--------------+ FV Mid   Full           Yes      Yes                                 +---------+---------------+---------+-----------+----------+--------------+ FV DistalFull           Yes      Yes                                 +---------+---------------+---------+-----------+----------+--------------+ PFV      Full                                                        +---------+---------------+---------+-----------+----------+--------------+  POP      Full                                                        +---------+---------------+---------+-----------+----------+--------------+ PTV      Full                                                        +---------+---------------+---------+-----------+----------+--------------+ PERO                                                  Acute          +---------+---------------+---------+-----------+----------+--------------+     Summary: BILATERAL: - No evidence of deep vein thrombosis seen in the lower extremities, bilaterally. -No evidence of popliteal cyst, bilaterally.   *See table(s) above for measurements and observations. Electronically signed by Harold Barban MD on 11/30/2022 at 6:48:04 PM.    Final    Recent Labs    12/02/22 0614  WBC 7.5  HGB 12.0  HCT 37.9  PLT 404*    Recent Labs    12/02/22 0614  NA 138  K 4.2  CL 100  CO2 30  GLUCOSE 108*  BUN 24*  CREATININE 0.80  CALCIUM 9.4     Intake/Output Summary (Last 24 hours) at 12/02/2022 0818 Last data filed at 12/02/2022 0100 Gross per 24 hour  Intake 656 ml  Output 650 ml  Net 6 ml         Physical Exam: Vital Signs Blood pressure 131/61, pulse 80, temperature 97.7 F (36.5 C), temperature source Oral, resp. rate 16, height '5\' 6"'$  (1.676 m), weight 113.6 kg, SpO2 95 %.  Constitutional: No apparent distress. Appropriate appearance for  age. +Obese HENT: No JVD. Neck Supple. Trachea midline. Atraumatic, normocephalic. +glasses Eyes: PERRLA. EOMI. Visual fields grossly intact.  Cardiovascular: RRR, no murmurs/rub/gallops. No Edema. Peripheral pulses 2+  Respiratory: CTAB. No rales, rhonchi, or wheezing. On RA.  Abdomen: + bowel sounds, normoactive. Mildly-distended, no tenderness.  GU: Not examined.   Skin:  No apparent lesions. Mepilex dressings on right distal thigh and proximal hip, with large surgical dressing overlying suture. All clean, dry, intact. Dry dressing intact to right hip. Sutures intact-not assessed, dressings without drainage. Asked nursing to change dressings today. Appropriately tender  MSK:      Mild swelling around R thigh with some dark bruising discoloration.       Significant hip pain with attempted ROM of both the L and R thighs      Strength:                RUE: 5/5 SA, 5/5 EF, 5/5 EE, 5/5 WE, 5/5 FF, 5/5 FA                 LUE: 5/5 SA, 5/5 EF, 5/5 EE, 5/5 WE, 5/5 FF, 5/5 FA                 RLE: 2-/5 HF, 2+/5 KE, 5/5 DF, 5/5 EHL, 5/5 PF  LLE:  2+/5 HF, 3+/5 KE, 5/5 DF, 5/5 EHL, 5/5 PF    Neurologic exam: Sitting up in bed.  Makes eye contact with examiner.  Follows full commands. Cognition: AAO to person, place, time and event.  Language: Fluent, No substitutions or neoglisms. No dysarthria. Names 3/3 objects correctly.  Memory: Recalls 3/3 objects at 5 minutes. No apparent deficits  Insight: Good insight into current condition.  Mood: Pleasant affect, appropriate mood.  Sensation: To light touch reduced in bilateral feet and ankles Reflexes: 2+ in BL UE and LEs. Negative Hoffman's and babinski signs bilaterally.  CN: 2-12 grossly intact.  Coordination: No apparent tremors.  Spasticity: MAS 0 in all extremities.       Assessment/Plan: 1. Functional deficits which require 3+ hours per day of interdisciplinary therapy in a comprehensive inpatient rehab setting. Physiatrist is  providing close team supervision and 24 hour management of active medical problems listed below. Physiatrist and rehab team continue to assess barriers to discharge/monitor patient progress toward functional and medical goals  Care Tool:  Bathing    Body parts bathed by patient: Right arm, Left arm, Chest, Abdomen, Front perineal area, Right upper leg, Left upper leg, Face   Body parts bathed by helper: Buttocks, Right lower leg, Left lower leg     Bathing assist Assist Level: Minimal Assistance - Patient > 75%     Upper Body Dressing/Undressing Upper body dressing   What is the patient wearing?: Hospital gown only    Upper body assist Assist Level: Set up assist (Item-retrieval)    Lower Body Dressing/Undressing Lower body dressing      What is the patient wearing?: Hospital gown only     Lower body assist       Toileting Toileting    Toileting assist Assist for toileting: Minimal Assistance - Patient > 75% (Wearing hospital gown.)     Transfers Chair/bed transfer  Transfers assist     Chair/bed transfer assist level: Maximal Assistance - Patient 25 - 49%     Locomotion Ambulation   Ambulation assist   Ambulation activity did not occur: Safety/medical concerns (limited by pain/activity tolerance)          Walk 10 feet activity   Assist  Walk 10 feet activity did not occur: Safety/medical concerns (limited by pain/activity tolerance)        Walk 50 feet activity   Assist Walk 50 feet with 2 turns activity did not occur: Safety/medical concerns (limited by pain/activity tolerance)         Walk 150 feet activity   Assist Walk 150 feet activity did not occur: Safety/medical concerns (limited by pain/activity tolerance)         Walk 10 feet on uneven surface  activity   Assist Walk 10 feet on uneven surfaces activity did not occur: Safety/medical concerns (limited by pain/activity tolerance)         Wheelchair     Assist  Is the patient using a wheelchair?: Yes Type of Wheelchair: Manual Wheelchair activity did not occur: Safety/medical concerns (fatigue/endurance/pain)         Wheelchair 50 feet with 2 turns activity    Assist    Wheelchair 50 feet with 2 turns activity did not occur: Safety/medical concerns       Wheelchair 150 feet activity     Assist  Wheelchair 150 feet activity did not occur: Safety/medical concerns       Blood pressure 131/61, pulse 80, temperature 97.7 F (36.5 C), temperature  source Oral, resp. rate 16, height '5\' 6"'$  (1.676 m), weight 113.6 kg, SpO2 95 %.  Medical Problem List and Plan: 1. Functional deficits secondary to right comminuted subtrochanteric proximal femur fracture.  Status post intramedullary nailing of right hip 11/26/2022 (Dr. Marcelino Scot).  Weightbearing as tolerated with walker, unrestricted ROM R hip/knee, f/up outpatient with ortho in osteoporosis clinic             -patient may shower             -ELOS/Goals: 10-14 days, SPV PT/OT 2.  Antithrombotics: -DVT/anticoagulation:  Pharmaceutical: Eliquis 2.'5mg'$  BID - ppx for 30 days post-op (11/26/22-12/26/22).   -12/01/22 Dopplers negative for DVT -antiplatelet therapy: N/A 3. Pain Management: Tylenol 325-'650mg'$  q6h PRN, Oxycodone 5-'10mg'$  q4h PRN and Robaxin '500mg'$  q6h PRN -Chronic chemotherapy induced peripheral neuropathy - stable, no additional medications req -12/01/22 pain doing ok, continue regimen and monitor -1/15 schedule tylenol q6h, pt plans to decrease oxycodone use Addendum, pt would like tylenol Q4h, will change to q4h, this is '3900mg'$ /day, would avoid using for more than a few days at this dose and frequency  4. Mood/Behavior/Sleep: Provide emotional support -antipsychotic agents: N/A 5. Neuropsych/cognition: This patient is capable of making decisions on her own behalf. 6. Skin/Wound Care: Routine skin checks -11/30/22 Spoke with Ainsley Spinner PA-C for OTS, states can have daily dressing changes per  nursing staff here, ok to shower and clean wounds with soap and water--ordered -12/01/22 made nursing aware of dressing change order daily   7. Fluids/Electrolytes/Nutrition: Routine in and outs with follow-up chemistries (weekly labs starting 12/02/22)  -Continue Vitamin C '1000mg'$  QD and vitamin D 2000IU BID -11/30/22 Monitor SCr, last value 1.06 on 11/28/22, encourage fluids and monitor -12/01/22 Cr 0.8 and Bun 24 stable, continue to monitor  8.  Acute blood loss anemia.  Hgb 10.6 on 11/28/22. Follow-up CBC on weekly labs starting 12/02/22 -1/15 HGB up to 12.0 9.  Morbid obesity.  BMI 38.94.  Dietary follow-up 10.  History of right breast cancer.  Follow-up outpatient. 11.  Constipation:  Colace BID, MiraLAX 17g QD PRN  -Per patient, LBM prior to admission during incident -Increase regimen to Sennakot-S 1 tab BID + miralax 17g QD, PRN MoM -11/30/22 doesn't feel constipated, but no BM since date of incident (11/24/22); added back Miralax 17g PRN in addition to QD dosing, added Fleet enema QD PRN and Sorbitol 70% 30-25m QD PRN; monitor -12/01/22 BM today! Continue current regimen.  -1/15 will change senokot to daily    LOS: 3 days A FACE TO FACE EVALUATION WAS PERFORMED  YJennye Boroughs1/15/2024, 8:18 AM

## 2022-12-02 NOTE — IPOC Note (Addendum)
Overall Plan of Care [facility]) Patient Details Name: Terri Mayer MRN: [id number] DOB: 06/27/1951  Admitting Diagnosis: Intertrochanteric fracture of right hip [facility])  Hospital Problems: Principal Problem:   Intertrochanteric fracture of right hip ([place]) Active Problems:   Slow transit constipation     Functional Problem List: Nursing Safety, Bowel, Edema, Skin Integrity, Endurance, Medication Management, Pain  PT Balance, Endurance, Pain, Safety  OT Balance, Pain, Sensory, Endurance  SLP    TR         Basic ADL's: OT Bathing, Dressing, Toileting     Advanced  ADL's: OT       Transfers: PT Bed Mobility, Bed to Chair, Car  OT Toilet, Tub/Shower     Locomotion: PT Ambulation, Stairs     Additional Impairments: OT    SLP        TR      Anticipated Outcomes Item Anticipated Outcome  Self Feeding    Swallowing      Basic self-care  SUP  Toileting  SUP   Bathroom Transfers SUP  Bowel/Bladder  continent x 2  Transfers  CGA + LRAD  Locomotion  CAG + LRAD  Communication     Cognition     Pain  less than 4  Safety/Judgment  remain fall free while in rehab   Therapy Plan: PT Intensity: Minimum of 1-2 x/day ,45 to 90 minutes PT Frequency: 5 out of 7 days PT Duration Estimated Length of Stay: 10-14 days OT Intensity: Minimum of 1-2 x/day, 45 to 90 minutes OT Frequency: 5 out of 7 days OT Duration/Estimated Length of Stay: 12-14 days     Team Interventions: Nursing Interventions Patient/Family Education, Pain Management, Medication Management, Discharge Planning, Bowel Management, Skin Care/Wound Management, Disease Management/Prevention  PT interventions Ambulation/gait training, Balance/vestibular training, Discharge planning, DME/adaptive equipment instruction, Functional mobility training, Pain management, Patient/family education, Stair training, Therapeutic Activities, Therapeutic Exercise, UE/LE Strength taining/ROM, UE/LE Coordination  activities, Wheelchair propulsion/positioning  OT Interventions Balance/vestibular training, Discharge planning, DME/adaptive equipment instruction, Functional mobility training, Pain management, Patient/family education, Self Care/advanced ADL retraining, Therapeutic Activities, Therapeutic Exercise, UE/LE Strength taining/ROM  SLP Interventions    TR Interventions    SW/CM Interventions Discharge Planning, Psychosocial Support, Patient/Family Education   Barriers to Discharge MD  Medical stability and Home enviroment access/loayout  Nursing Home environment access/layout, Wound Care, Lack of/limited family support, Weight, Weight bearing restrictions, Medication compliance 1 level with 2 ste entry no rails  PT Weight, Lack of/limited family support, Home environment access/layout lives alone, but family and neighbors may be able to help; 1 STE with no rails  OT Lack of/limited family support, Weight    SLP      SW Decreased caregiver support, Insurance underwriter for SNF coverage     Team Discharge Planning: Destination: PT-Home ,OT- Home , SLP-  Projected Follow-up: PT-Home health PT, OT-  Home health OT, SLP-  Projected Equipment Needs: PT-To be determined, Rolling walker with 5" wheels, OT- To be determined, SLP-  Equipment Details: PT-TBD on WC, OT-  Patient/family involved in discharge planning: PT- Patient,  OT-Patient, SLP-   MD ELOS: 10-14 Medical Rehab Prognosis:  Excellent Assessment: The patient has been admitted for CIR therapies with the diagnosis of right comminuted subtrochanteric proximal femur fracture . The team will be addressing functional mobility, strength, stamina, balance, safety, adaptive techniques and equipment, self-care, bowel and bladder mgt, patient and caregiver education. Goals have been set at supervision. Anticipated discharge destination is home.  See Team Conference Notes for weekly updates to the plan of care

## 2022-12-02 NOTE — Progress Notes (Signed)
Inpatient Rehabilitation Care Coordinator Assessment and Plan Patient Details  Name: Terri Mayer MRN: [id number] Date of Birth: 1951-05-02  Today's Date: 12/02/2022  Hospital Problems: Principal Problem:   Intertrochanteric fracture of right hip ([place]) Active Problems:   Slow transit constipation  Past Medical History:  Past Medical History:  Diagnosis Date   Allergy    Breast cancer ([place])    History of chicken pox    Osteoarthritis    Neck,Back,Knees,Hips,Ankles   Personal history of chemotherapy    Personal history of radiation therapy    Past Surgical History:  Past Surgical History:  Procedure Laterality Date   BREAST LUMPECTOMY Right 2017   DILATION AND CURETTAGE OF UTERUS     EYE SURGERY  at age 39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   INTRAMEDULLARY (IM) NAIL INTERTROCHANTERIC Right 11/26/2022   Procedure: INTRAMEDULLARY (IM) NAIL INTERTROCHANTERIC;  Surgeon: Altamese Kempton, MD;  Location: [place];  Service: Orthopedics;  Laterality: Right;   PORTACATH PLACEMENT Right 12/11/2015   Procedure: INSERTION PORT-A-CATH WITH ULTRASOUND;  Surgeon: Rolm Bookbinder, MD;  Location: [place];  Service: General;  Laterality: Right;   RADIOACTIVE SEED GUIDED PARTIAL MASTECTOMY/AXILLARY SENTINEL NODE BIOPSY/AXILLARY NODE DISSECTION Right 05/02/2016   Procedure: RIGHT BREAST SEED GUIDED LUMPECTOMY AND RIGHT SEED TARGETED AXILLARY DISSECTION,AND SENTINEL NODE BIOPSY;  Surgeon: Rolm Bookbinder, MD;  Location: [place];  Service: General;  Laterality: Right;  RIGHT BREAST SEED GUIDED LUMPECTOMY AND RIGHT SEED TARGETED AXILLARY DISSECTION,AND SENTINEL NODE BIOPSY   WISDOM TOOTH EXTRACTION     Social History:  reports that she has never smoked. She has never used smokeless tobacco. She reports that she does not currently use alcohol. She reports that she does not currently use drugs.  Family / Support Systems Marital Status: Single Patient Roles: Other (Comment),  Parent (sibling and neighbor) Children: Sherril Cong [phone] Other Supports: Earney [place] [phone]   [place] in-law [phone] Anticipated Caregiver: Sister Ability/Limitations of Caregiver: Sister to come from [place] for 2 weeks to assist with transition home Caregiver Availability: 24/7 (short time 24/7) Family Dynamics: Close knit family who are involved and will assist. Sister coming from [place] to assist her once discharged home.  Social History Preferred language: English Religion: Presbyterian Cultural Background: No issues Education: [place] - How often do you need to have someone help you when you read instructions, pamphlets, or other written material from your doctor or pharmacy?: Never Writes: Yes Employment Status: Retired Public relations account executive Issues: No issues Guardian/Conservator: None-according to MD pt is capable of making her own decisions while here   Abuse/Neglect Abuse/Neglect Assessment Can Be Completed: Yes Physical Abuse: Denies Verbal Abuse: Denies Sexual Abuse: Denies Exploitation of patient/patient's resources: Denies Self-Neglect: Denies  Patient response to: Social Isolation - How often do you feel lonely or isolated from those around you?: Never  Emotional Status Pt's affect, behavior and adjustment status: Pt is motivated to do well and recover from her fracture and learn how to manage. She feels she has progressed already and hopeful this will continue. She has always been independent and taken care of herself. Recent Psychosocial Issues: other health issues Psychiatric History: No history seems to be coping appropriately with her hospitalization and able to verbalize her feelings regarding fall and being here. Substance Abuse History: No issues  Patient / Family Perceptions, Expectations & Goals Pt/Family understanding of illness & functional limitations: Pt is able to explain her fracture and WB issues. She is doing her  best and feels  she is progressing. She does talk with the MD and feels understands her treatment plan moving forward. Premorbid pt/family roles/activities: mom, retiree, sibling, neighbor, etc Anticipated changes in roles/activities/participation: resume Pt/family expectations/goals: Pt states: " I hope to be moving better and not needing assist."  [employer]: None Premorbid Home Care/DME Agencies: Other (Comment) (cane and riser) Transportation available at discharge: self Will need to rely upon others now Is the patient able to respond to transportation needs?: Yes In the past 12 months, has lack of transportation kept you from medical appointments or from getting medications?: No In the past 12 months, has lack of transportation kept you from meetings, work, or from getting things needed for daily living?: No  Discharge Planning Living Arrangements: Alone Support Systems: Children, Other relatives, Friends/neighbors Type of Residence: Private residence Insurance Resources: Multimedia programmer (specify) (Health Team Advantage) Financial Resources: Social Security Financial Screen Referred: No Living Expenses: Own Money Management: Patient Does the patient have any problems obtaining your medications?: No Home Management: self Patient/Family Preliminary Plans: Return home with sister coming from [place] to assist with her care until is mod/i. Pt is hopeful she will be moving with a rolling walker when she leaves and knows it may take her two weeks here to do this. Care Coordinator Barriers to Discharge: Decreased caregiver support, Insurance for SNF coverage Care Coordinator Anticipated Follow Up Needs: HH/OP  Clinical Impression Pleasant female who is motivated to do well and make progress with her WBAT and learning to adapt. Her sister will be coming from [place] and [place] with transition home. Will work with on discharge needs.  Elease Hashimoto 12/02/2022,  12:08 PM

## 2022-12-02 NOTE — Progress Notes (Signed)
Occupational Therapy Session Note  Patient Details  Name: Cornisha Zetino MRN: [id number] Date of Birth: 29-Aug-1951  {CHL IP REHAB OT TIME CALCULATIONS:[id number]}   Short Term Goals: Week 1:  OT Short Term Goal 1 (Week 1): Pt will perform toilet transfer with Mod A + LRAD. OT Short Term Goal 2 (Week 1): Pt will complete LB dressing with Mod A + LRAD. OT Short Term Goal 3 (Week 1): Pt will complete clothing management with Mod A + LRAD during toileting. OT Short Term Goal 4 (Week 1): Pt will complete tub/shower transfer with Mod A + LRAD.  Skilled Therapeutic Interventions/Progress Updates:  Pt received *** for skilled OT session with focus on ***. Pt agreeable to interventions, demonstrating overall *** mood. Pt reported ***/10 pain, stating "***" in reference to ***. OT offering intermediate rest breaks and positioning suggestions throughout session to address pain/fatigue and maximize participation/safety in session.    Pt remained *** with all immediate needs met at end of session. Pt continues to be appropriate for skilled OT intervention to promote further functional independence.   Therapy Documentation Precautions:  Precautions Precautions: Fall Restrictions Weight Bearing Restrictions: Yes RLE Weight Bearing: Weight bearing as tolerated    Therapy/Group: Individual Therapy  Maudie Mercury, OTR/L, MSOT  12/02/2022, 9:33 PM

## 2022-12-02 NOTE — Progress Notes (Signed)
[facility] Individual Statement of Services  Patient Name:  Terri Mayer  Date:  12/02/2022  Welcome to the [facility].  Our goal is to provide you with an individualized program based on your diagnosis and situation, designed to meet your specific needs.  With this comprehensive rehabilitation program, you will be expected to participate in at least 3 hours of rehabilitation therapies Monday-Friday, with modified therapy programming on the weekends.  Your rehabilitation program will include the following services:  Physical Therapy (PT), Occupational Therapy (OT), 24 hour per day rehabilitation nursing, Therapeutic Recreaction (TR), Care Coordinator, Rehabilitation Medicine, Nutrition Services, and Pharmacy Services  Weekly team conferences will be held on Tuesday to discuss your progress.  Your Inpatient Rehabilitation Care Coordinator will talk with you frequently to get your input and to update you on team discussions.  Team conferences with you and your family in attendance may also be held.  Expected length of stay: 12-14 days  Overall anticipated outcome: supervision-CGA level  Depending on your progress and recovery, your program may change. Your Inpatient Rehabilitation Care Coordinator will coordinate services and will keep you informed of any changes. Your Inpatient Rehabilitation Care Coordinator's name and contact numbers are listed  below.  The following services may also be recommended but are not provided by the [facility] will be made to provide these services after discharge if needed.  Arrangements include referral to agencies that provide these services.  Your insurance has been verified to be:  HTA Your primary doctor is:  Pricilla Holm  Pertinent information will be shared with your doctor  and your insurance company.  Inpatient Rehabilitation Care Coordinator:  Ovidio Kin, [place] or Emilia Beck  Information discussed with and copy given to patient by: Elease Hashimoto, 12/02/2022, 11:42 AM

## 2022-12-03 NOTE — Progress Notes (Signed)
Physical Therapy Session Note  Patient Details  Name: Terri Mayer MRN: [id number] Date of Birth: 07-26-1951  Today's Date: 12/03/2022 PT Individual Time: 1401-1441 PT Individual Time Calculation (min): 40 min   Short Term Goals: Week 1:  PT Short Term Goal 1 (Week 1): Pt will completed sit to stand with ModA + LRAD PT Short Term Goal 2 (Week 1): Pt will completed SPT with ModA + LRAD PT Short Term Goal 3 (Week 1): Pt will maintain dynamic standing with ModA + LRAD for 5 mins PT Short Term Goal 4 (Week 1): Pt will initiate ambulation activities  Skilled Therapeutic Interventions/Progress Updates:      Pt sitting in w/c to start, in rehab gym after finishing OT session. Pt in agreement to therapy, denies resting pain but reports 5/10 RLE pain with mobility. Rest breaks and repositioning provided for pain management.  Sit<>stand to RW with minA from w/c height. Gait training 175f with CGA and RW on level surfaces. Gait antalgic due to RLE pain but accepts weight well without knee buckling. She does extend her trunk to help advance RLE.   In // bars, worked on standing there-ex for strengthening, balance, and endurance: -standing hip flexion on R 1x10 -standing hip abd on R 1x10 *supervision for standing there-ex. Limited AROM due to pain > weakness.   Returned to her room and patient assisted back to bed via stand<>pivot transfer with minA and RW - due to increased pain from RLE as session progressed, more antalgic with weight bearing and lifting for transfer. Sit>supine with minA for RLE management. Provided her with ice pack for R hip, as well as pillows for supporting and comfort. Alarm on, all needs met at end of session.  Therapy Documentation Precautions:  Precautions Precautions: Fall Restrictions Weight Bearing Restrictions: Yes RLE Weight Bearing: Weight bearing as tolerated General:    Therapy/Group: Individual Therapy  Elmus Mathes P Sheila Gervasi PT 12/03/2022, 7:36 AM

## 2022-12-03 NOTE — Progress Notes (Signed)
Occupational Therapy Session Note  Patient Details  Name: Terri Mayer MRN: [id number] Date of Birth: Oct 05, 1951  Today's Date: 12/03/2022 OT Individual Time: 2505-3976 OT Individual Time Calculation (min): 36 min    Short Term Goals: Week 1:  OT Short Term Goal 1 (Week 1): Pt will perform toilet transfer with Mod A + LRAD. OT Short Term Goal 2 (Week 1): Pt will complete LB dressing with Mod A + LRAD. OT Short Term Goal 3 (Week 1): Pt will complete clothing management with Mod A + LRAD during toileting. OT Short Term Goal 4 (Week 1): Pt will complete tub/shower transfer with Mod A + LRAD.  Skilled Therapeutic Interventions/Progress Updates:  Pt greeted supine in bed, pt agreeable to OT intervention. Session focus on BADL reeducation, functional mobility, dynamic standing balance and decreasing overall caregiver burden.                     Pt completed supine>sit with use of bed features with CGA. Pt completed sit>stand to RW from elevated EOB with LUE pushing from foot of bed and RUE on RW with MINA. Pt completed stand pivot to w/c with RW and MINA. Pt completeted seated grooming tasks at sink MODI.   Discussed home measurement sheet with info written on white board and in OT sticky. Pt completed stand pivot to [facility] over toilet with RW and MINA. Pt with + urine void, pt needed MIN A for 3/3 toileting tasks able to complete anterior pericare with set- up assist but needed assist for clothing mgmt.   Ended session with pt supine in bed with all needs within reach and bed alarm activated.                   Therapy Documentation Precautions:  Precautions Precautions: Fall Restrictions Weight Bearing Restrictions: Yes RLE Weight Bearing: Weight bearing as tolerated  Pain: unrated pain reported at end of session with ice provided.     Therapy/Group: Individual Therapy  Corinne Ports [facility] 12/03/2022, 12:08 PM

## 2022-12-03 NOTE — Progress Notes (Signed)
Patient ID: Terri Mayer, female   DOB: [birth year], 72 y.o.   MRN: [id number]  Met with pt to update her regarding team conference goals of supervision-CGA level and target discharge 1/26. Pt is hoping this is long enough to reach her goals she feels she has only had two days of therapy-came Friday 1/12 no therapy-Sat evaluation no therapy on Sunday and Monday full day of therapy. She feels she may need longer and discussed team will continue to monitor and discuss her goal attainment. Pt will let her sister know so can begin to plan to come here to assist her. Will continue to work on discharge needs.

## 2022-12-03 NOTE — Patient Care Conference (Signed)
Inpatient RehabilitationTeam Conference and Plan of Care Update Date: 12/03/2022   Time: 11:32 AM    Patient Name: Terri Mayer      Medical Record Number: [id number]  Date of Birth: [birth year] Sex: Female         Room/Bed: 4W13C/4W13C-01 Payor Info: Payor: [employer] / Plan: [employer] Must PPO / Product Type: *No Product type* /    Admit Date/Time:  11/29/2022  3:08 PM  Primary Diagnosis:  Intertrochanteric fracture of right hip [facility])  Hospital Problems: Principal Problem:   Intertrochanteric fracture of right hip [facility]) Active Problems:   Slow transit constipation    Expected Discharge Date: Expected Discharge Date: 12/13/22  Team Members Present: Physician leading conference: Dr. Courtney Heys Social Worker Present: Ovidio Kin, LCSW Nurse Present: Tacy Learn, RN PT Present: Ailene Rud, PT OT Present: Jamey Ripa, OT PPS Coordinator present : Gunnar Fusi, SLP     Current Status/Progress Goal Weekly Team Focus  Bowel/Bladder   Continent B&B. taking miralax daily and senna s   Remain continent of B & B   Assess need to adjust bowel meds.    Swallow/Nutrition/ Hydration               ADL's   Functional transfers with overall Min A + RW, UB bathing/dressing with set-up, LB bathing/dressing with Min-Mod A   Supervision   Functional transfers, AE education for LB dressing, activity tolerance, generalized strengthening    Mobility   min A bed mobility, min-mod STS   CGA overall  transfers, gait, pain management,    Communication                Safety/Cognition/ Behavioral Observations               Pain   pain managed with scheduled tylenol, PRN robaxin & Oxy IR '10mg'$ 's   <2 on pain scale   assess pain every 4 hours, pre medicate befoe therapy    Skin   right hip incision with surgical dressing in place   remain free of infection or breakdown  monitor skin every shift and PRN      Discharge Planning:  HOme with  sister coming from [place] to stay with for two weeks to assist with her care, may need to stay longer if pt needs this   Team Discussion: Right hip fracture. Continent B/B. Pain managed with scheduled Tylenol and PRN oxycodone, robaxin. Right hip incision without active drainage. WBAT.  Able to take a few steps today with RW. MinA bed mobility. Working on transfers and using adaptive equipment for LB ADLs. Patient on target to meet rehab goals: [place] and progress notes for long and short-term goals.   Revisions to Treatment Plan:  Medication adjustments, monitor labs  Teaching Needs: Medications, safety, self care, skin/wound care, gait/transfer training, etc  Current Barriers to Discharge: Decreased caregiver support, Home enviroment access/layout, Lack of/limited family support, Weight, and Weight bearing restrictions  Possible Resolutions to Barriers: Family education, nursing education, order recommended DME     Medical Summary Current Status: pt insistent to do tylenol 650 mg q4 hours- we discussed needs to reduce to 500 mg q4 hours in 3-4 days- continent B/B- no active drainage of incisions  Barriers to Discharge: Other (comments);Uncontrolled Pain;Weight bearing restrictions;Behavior/Mood;Medical stability;Morbid Obesity;Self-care education;Complicated Wound  Barriers to Discharge Comments: limited by: pain, doesn't want to put weight on RLE- self limiting somewhat; sister coming from [facility] for a couple of weeks  Possible Resolutions to [employer]: goals supervision, not mod I- and CGA with PT- pt wants to stay as long as possible- 12/13/22   Continued Need for Acute Rehabilitation Level of Care: The patient requires daily medical management by a physician with specialized training in physical medicine and rehabilitation for the following reasons: Direction of a multidisciplinary physical rehabilitation program to maximize functional independence :  Yes Medical management of patient stability for increased activity during participation in an intensive rehabilitation regime.: Yes Analysis of laboratory values and/or radiology reports with any subsequent need for medication adjustment and/or medical intervention. : Yes   I attest that I was present, lead the team conference, and concur with the assessment and plan of the team.   Ernest Pine 12/03/2022, 8:40 PM

## 2022-12-03 NOTE — Progress Notes (Signed)
PROGRESS NOTE   Subjective/Complaints:   Pt educated on trying to reduce Tylenol use as soon as possible to 500 mg q4 hours- she said willing to "in a few days"- explained that it can increase risk of LFT elevation, at 4G/day, so will monitor closely.   LBM yesterday- going wlel Only using oxy for therapy- tylenol otherwise.   Main pain in groin on R when has pain.     ROS:   Pt denies SOB, abd pain, CP, N/V/C/D, and vision changes Except for HPI   Objective:   No results found. Recent Labs    12/02/22 0614  WBC 7.5  HGB 12.0  HCT 37.9  PLT 404*   Recent Labs    12/02/22 0614  NA 138  K 4.2  CL 100  CO2 30  GLUCOSE 108*  BUN 24*  CREATININE 0.80  CALCIUM 9.4    Intake/Output Summary (Last 24 hours) at 12/03/2022 0913 Last data filed at 12/03/2022 0755 Gross per 24 hour  Intake 1275 ml  Output 925 ml  Net 350 ml        Physical Exam: Vital Signs Blood pressure (!) 132/56, pulse 79, temperature 98.4 F (36.9 C), temperature source Oral, resp. rate 17, height '5\' 6"'$  (1.676 m), weight 113.6 kg, SpO2 96 %.   General: awake, alert, appropriate, sitting up in bed; NAD HENT: conjugate gaze; oropharynx moist CV: regular rate and rhythm; no JVD Pulmonary: CTA B/L; no W/R/R- good air movement GI: soft, NT, ND, (+)BS Psychiatric: appropriate Neurological: Ox3 Skin:  mild to moderate swelling, however incision have sutures- look great- a little dried blood, but no drainage or erythema;  Appropriately tender to palpation MSK:      Mild swelling around R thigh with some dark bruising discoloration.       Significant hip pain with attempted ROM of both the L and R thighs      Strength:                RUE: 5/5 SA, 5/5 EF, 5/5 EE, 5/5 WE, 5/5 FF, 5/5 FA                 LUE: 5/5 SA, 5/5 EF, 5/5 EE, 5/5 WE, 5/5 FF, 5/5 FA                 RLE: 2-/5 HF, 2+/5 KE, 5/5 DF, 5/5 EHL, 5/5 PF                 LLE:   2+/5 HF, 3+/5 KE, 5/5 DF, 5/5 EHL, 5/5 PF    Neurologic exam: Sitting up in bed.  Makes eye contact with examiner.  Follows full commands. Cognition: AAO to person, place, time and event.  Language: Fluent, No substitutions or neoglisms. No dysarthria. Names 3/3 objects correctly.  Memory: Recalls 3/3 objects at 5 minutes. No apparent deficits  Insight: Good insight into current condition.  Mood: Pleasant affect, appropriate mood.  Sensation: To light touch reduced in bilateral feet and ankles Reflexes: 2+ in BL UE and LEs. Negative Hoffman's and babinski signs bilaterally.  CN: 2-12 grossly intact.  Coordination: No apparent tremors.  Spasticity: MAS 0  in all extremities.       Assessment/Plan: 1. Functional deficits which require 3+ hours per day of interdisciplinary therapy in a comprehensive inpatient rehab setting. Physiatrist is providing close team supervision and 24 hour management of active medical problems listed below. Physiatrist and rehab team continue to assess barriers to discharge/monitor patient progress toward functional and medical goals  Care Tool:  Bathing    Body parts bathed by patient: Right arm, Left arm, Chest, Abdomen, Front perineal area, Right upper leg, Left upper leg, Face   Body parts bathed by helper: Buttocks, Right lower leg, Left lower leg     Bathing assist Assist Level: Minimal Assistance - Patient > 75%     Upper Body Dressing/Undressing Upper body dressing   What is the patient wearing?: Hospital gown only    Upper body assist Assist Level: Set up assist (Item-retrieval)    Lower Body Dressing/Undressing Lower body dressing      What is the patient wearing?: Hospital gown only     Lower body assist       Toileting Toileting    Toileting assist Assist for toileting: Minimal Assistance - Patient > 75% (Wearing hospital gown.)     Transfers Chair/bed transfer  Transfers assist     Chair/bed transfer assist level:  Maximal Assistance - Patient 25 - 49%     Locomotion Ambulation   Ambulation assist   Ambulation activity did not occur: Safety/medical concerns (limited by pain/activity tolerance)          Walk 10 feet activity   Assist  Walk 10 feet activity did not occur: Safety/medical concerns (limited by pain/activity tolerance)        Walk 50 feet activity   Assist Walk 50 feet with 2 turns activity did not occur: Safety/medical concerns (limited by pain/activity tolerance)         Walk 150 feet activity   Assist Walk 150 feet activity did not occur: Safety/medical concerns (limited by pain/activity tolerance)         Walk 10 feet on uneven surface  activity   Assist Walk 10 feet on uneven surfaces activity did not occur: Safety/medical concerns (limited by pain/activity tolerance)         Wheelchair     Assist Is the patient using a wheelchair?: Yes Type of Wheelchair: Manual Wheelchair activity did not occur: Safety/medical concerns (fatigue/endurance/pain)         Wheelchair 50 feet with 2 turns activity    Assist    Wheelchair 50 feet with 2 turns activity did not occur: Safety/medical concerns       Wheelchair 150 feet activity     Assist  Wheelchair 150 feet activity did not occur: Safety/medical concerns       Blood pressure (!) 132/56, pulse 79, temperature 98.4 F (36.9 C), temperature source Oral, resp. rate 17, height '5\' 6"'$  (1.676 m), weight 113.6 kg, SpO2 96 %.  Medical Problem List and Plan: 1. Functional deficits secondary to right comminuted subtrochanteric proximal femur fracture.  Status post intramedullary nailing of right hip 11/26/2022 (Dr. Marcelino Scot).  Weightbearing as tolerated with walker, unrestricted ROM R hip/knee, f/up outpatient with ortho in osteoporosis clinic             -patient may shower             -ELOS/Goals: 10-14 days, SPV PT/OT  Con't CIR- PT and OT- team conference today to determine length of  stay 2.  Antithrombotics: -DVT/anticoagulation:  Pharmaceutical:  Eliquis 2.'5mg'$  BID - ppx for 30 days post-op (11/26/22-12/26/22).   -12/01/22 Dopplers negative for DVT -antiplatelet therapy: N/A 3. Pain Management: Tylenol 325-'650mg'$  q6h PRN, Oxycodone 5-'10mg'$  q4h PRN and Robaxin '500mg'$  q6h PRN -Chronic chemotherapy induced peripheral neuropathy - stable, no additional medications req -12/01/22 pain doing ok, continue regimen and monitor -1/15 schedule tylenol q6h, pt plans to decrease oxycodone use Addendum, pt would like tylenol Q4h, will change to q4h, this is '3900mg'$ /day, would avoid using for more than a few days at this dose and frequency  1/16- educated pt will do for 3-4 days, but then reduce to 500 mg at a time q4 hours.  4. Mood/Behavior/Sleep: Provide emotional support -antipsychotic agents: N/A 5. Neuropsych/cognition: This patient is capable of making decisions on her own behalf. 6. Skin/Wound Care: Routine skin checks -11/30/22 Spoke with Ainsley Spinner PA-C for OTS, states can have daily dressing changes per nursing staff here, ok to shower and clean wounds with soap and water--ordered -12/01/22 made nursing aware of dressing change order daily   1/16- cont' daily dressing.  7. Fluids/Electrolytes/Nutrition: Routine in and outs with follow-up chemistries (weekly labs starting 12/02/22)  -Continue Vitamin C '1000mg'$  QD and vitamin D 2000IU BID -11/30/22 Monitor SCr, last value 1.06 on 11/28/22, encourage fluids and monitor -12/02/22 Cr 0.8 and Bun 24 stable, continue to monitor  8.  Acute blood loss anemia.  Hgb 10.6 on 11/28/22. Follow-up CBC on weekly labs starting 12/02/22 -1/15 HGB up to 12.0 9.  Morbid obesity.  BMI 38.94.  Dietary follow-up 10.  History of right breast cancer.  Follow-up outpatient. 11.  Constipation:  Colace BID, MiraLAX 17g QD PRN  -Per patient, LBM prior to admission during incident -Increase regimen to Sennakot-S 1 tab BID + miralax 17g QD, PRN MoM -11/30/22 doesn't feel  constipated, but no BM since date of incident (11/24/22); added back Miralax 17g PRN in addition to QD dosing, added Fleet enema QD PRN and Sorbitol 70% 30-2m QD PRN; monitor -12/01/22 BM today! Continue current regimen.  -1/15 will change senokot to daily   1/16- LBM yesterday   I spent a total of 36    minutes on total care today- >50% coordination of care- due to  Team conference and d/w pt about tylenol and LFTs- latest CMP shows no increase in LFTs as of yesterday.     LOS: 4 days A FACE TO FACE EVALUATION WAS PERFORMED  Kaithlyn Teagle 12/03/2022, 9:13 AM

## 2022-12-03 NOTE — Progress Notes (Signed)
Occupational Therapy Session Note  Patient Details  Name: Terri Mayer MRN: [id number] Date of Birth: 02/13/1951  Today's Date: 12/03/2022 OT Individual Time: 1300-1357 OT Individual Time Calculation (min): 57 min    Short Term Goals: Week 1:  OT Short Term Goal 1 (Week 1): Pt will perform toilet transfer with Mod A + LRAD. OT Short Term Goal 2 (Week 1): Pt will complete LB dressing with Mod A + LRAD. OT Short Term Goal 3 (Week 1): Pt will complete clothing management with Mod A + LRAD during toileting. OT Short Term Goal 4 (Week 1): Pt will complete tub/shower transfer with Mod A + LRAD.  Skilled Therapeutic Interventions/Progress Updates:    OT intervention with focus on discharge planning, DME recommendations, and AE recommendations. Pt practiced donning socks/shoed with sock aid and reacher for zipping shoes. Reviewed reacher, leg lifter, LH sponge, and dressing stick recommendations. Pt searched [place] for all items and placed in her cart. Reviewed pictures of shower stall at home and discussed possible use of alternate bathroom with tub shower. Discussed TTB and recommendations for grab bars. Pt remained in w/c in gym awaiting PT.   Therapy Documentation Precautions:  Precautions Precautions: Fall Restrictions Weight Bearing Restrictions: Yes RLE Weight Bearing: Weight bearing as tolerated  Pain: Pain Assessment Pain Scale: 0-10 Pain Score: 3  Pain Location: Hip Pain Orientation: Right Pain Intervention(s): Medication prior to therapy   Therapy/Group: Individual Therapy  Leroy Libman 12/03/2022, 2:13 PM

## 2022-12-03 NOTE — Progress Notes (Signed)
Occupational Therapy Session Note  Patient Details  Name: Terri Mayer MRN: [id number] Date of Birth: 1951/10/14  Today's Date: 12/04/2022 OT Individual Time: 2707-8675 OT Individual Time Calculation (min): 55 min   Today's Date: 12/04/2022 OT Individual Time: 4492-0100 OT Individual Time Calculation (min): 73 min   Short Term Goals: Week 1:  OT Short Term Goal 1 (Week 1): Pt will perform toilet transfer with Mod A + LRAD. OT Short Term Goal 2 (Week 1): Pt will complete LB dressing with Mod A + LRAD. OT Short Term Goal 3 (Week 1): Pt will complete clothing management with Mod A + LRAD during toileting. OT Short Term Goal 4 (Week 1): Pt will complete tub/shower transfer with Mod A + LRAD.  Skilled Therapeutic Interventions/Progress Updates:   Session 1: Pt received sitting in [facility] for skilled OT session with focus on ADL retraining. Pt agreeable to interventions, demonstrating overall pleasant mood. Pt with un-rate pain, stating "We are going to take a shower." Upon OT arrival. OT offering intermediate rest breaks and positioning suggestions throughout session to address pain/fatigue and maximize participation/safety in session.   Pt completing multiple stand-step transfers with CGA +RW this session, using grab bars for shower transfer onto TTB. Pt bathes UB with retrieval of needed items, requiring Mod-Max A for cleaning of peri-area (in standing due to body habitus), calves, and feet.  Pt requires increased A for LB dressing due to time constraints, completing UB dressing with item-retrieval.   Pt performs WC>EOB>supine with stand-steps, needing Min A for LE elevation onto bed.   Pt remained resting in bed with all immediate needs met at end of session. Pt continues to be appropriate for skilled OT intervention to promote further functional independence.   Session 2: Pt received seated in [facility] for skilled OT session with focus on functional transfers, DME, and discharge planning. Pt  agreeable to interventions, demonstrating overall pleasant mood. Pt with fluctuating paint. OT offering intermediate rest breaks and positioning suggestions throughout session to address pain/fatigue and maximize participation/safety in session.   Pt with urgency upon OT arrival, ambulating into bathroom with supervision-CGA + RW. Pt requires Min A to complete toileting activities, needing A for posterior pericare and bringing LB garments within reach to don over bottom/hips. Pt attempts to ambulate back to [facility], making it to bathroom door, requiring a seated rest-break due to increased pain in RLE. Pt performs stand-step transfer back to WC with CGA+RW.   Pt dependent for WC transport from room <> therapy areas for time management. In simulated bathroom environment, pt and OT discuss home-bathroom layout and appropriateness of DME for size of bathroom.  In patients room, pt and OT look over home-bathroom pictures and problem solve entry into bathroom and potential placement of grab bars.   Pt remained seated in [facility] with all immediate needs met at end of session. Pt continues to be appropriate for skilled OT intervention to promote further functional independence.   Therapy Documentation Precautions:  Precautions Precautions: Fall Restrictions Weight Bearing Restrictions: Yes RLE Weight Bearing: Weight bearing as tolerated  Therapy/Group: Individual Therapy  Maudie Mercury, OTR/L, MSOT  12/04/2022, 12:46 PM

## 2022-12-04 MED ORDER — SENNOSIDES-DOCUSATE SODIUM 8.6-50 MG PO TABS
3.0000 | ORAL_TABLET | Freq: Every day | ORAL | Status: DC
  Administered 2022-12-05: 3 via ORAL
  Filled 2022-12-04: qty 3

## 2022-12-04 NOTE — Progress Notes (Signed)
PROGRESS NOTE   Subjective/Complaints:   Pt reports miralax makes her feel full/bloated- wants to try something else- No BM yesterday- been 2 days.   Also really cold- will increase thermostat by 1 degree per pt request.   Oxy makes her sleepy- not unreasonable, but would like to switch to Norco.  We discussed at length since the tylenol issue.   ROS:   Pt denies SOB, abd pain, CP, N/V/C/D, and vision changes Except for HPI   Objective:   No results found. Recent Labs    12/02/22 0614  WBC 7.5  HGB 12.0  HCT 37.9  PLT 404*   Recent Labs    12/02/22 0614  NA 138  K 4.2  CL 100  CO2 30  GLUCOSE 108*  BUN 24*  CREATININE 0.80  CALCIUM 9.4    Intake/Output Summary (Last 24 hours) at 12/04/2022 0807 Last data filed at 12/04/2022 0647 Gross per 24 hour  Intake 650 ml  Output 850 ml  Net -200 ml        Physical Exam: Vital Signs Blood pressure 125/61, pulse 79, temperature 98.2 F (36.8 C), temperature source Oral, resp. rate 17, height '5\' 6"'$  (1.676 m), weight 113.6 kg, SpO2 94 %.    General: awake, alert, appropriate, sitting up in bed; NAD HENT: conjugate gaze; oropharynx moist CV: regular rate and rhythm; no JVD Pulmonary: CTA B/L; no W/R/R- good air movement GI: soft, NT, ND, (+)BS- slightly hypoactive Psychiatric: appropriate- interactive Neurological: Ox3  Skin:  mild to moderate swelling, however incision have sutures- look great- a little dried blood, but no drainage or erythema;  Appropriately tender to palpation- today C/D/I- covered with bandage MSK:      Mild swelling around R thigh with some dark bruising discoloration.       Significant hip pain with attempted ROM of both the L and R thighs      Strength:                RUE: 5/5 SA, 5/5 EF, 5/5 EE, 5/5 WE, 5/5 FF, 5/5 FA                 LUE: 5/5 SA, 5/5 EF, 5/5 EE, 5/5 WE, 5/5 FF, 5/5 FA                 RLE: 2-/5 HF, 2+/5 KE, 5/5  DF, 5/5 EHL, 5/5 PF                 LLE:  2+/5 HF, 3+/5 KE, 5/5 DF, 5/5 EHL, 5/5 PF    Neurologic exam: Sitting up in bed.  Makes eye contact with examiner.  Follows full commands. Cognition: AAO to person, place, time and event.  Language: Fluent, No substitutions or neoglisms. No dysarthria. Names 3/3 objects correctly.  Memory: Recalls 3/3 objects at 5 minutes. No apparent deficits  Insight: Good insight into current condition.  Mood: Pleasant affect, appropriate mood.  Sensation: To light touch reduced in bilateral feet and ankles Reflexes: 2+ in BL UE and LEs. Negative Hoffman's and babinski signs bilaterally.  CN: 2-12 grossly intact.  Coordination: No apparent tremors.  Spasticity: MAS 0 in  all extremities.       Assessment/Plan: 1. Functional deficits which require 3+ hours per day of interdisciplinary therapy in a comprehensive inpatient rehab setting. Physiatrist is providing close team supervision and 24 hour management of active medical problems listed below. Physiatrist and rehab team continue to assess barriers to discharge/monitor patient progress toward functional and medical goals  Care Tool:  Bathing    Body parts bathed by patient: Right arm, Left arm, Chest, Abdomen, Front perineal area, Right upper leg, Left upper leg, Face   Body parts bathed by helper: Buttocks, Right lower leg, Left lower leg     Bathing assist Assist Level: Minimal Assistance - Patient > 75%     Upper Body Dressing/Undressing Upper body dressing   What is the patient wearing?: Hospital gown only    Upper body assist Assist Level: Set up assist (Item-retrieval)    Lower Body Dressing/Undressing Lower body dressing      What is the patient wearing?: Hospital gown only     Lower body assist       Toileting Toileting    Toileting assist Assist for toileting: Minimal Assistance - Patient > 75%     Transfers Chair/bed transfer  Transfers assist     Chair/bed transfer  assist level: Maximal Assistance - Patient 25 - 49%     Locomotion Ambulation   Ambulation assist   Ambulation activity did not occur: Safety/medical concerns (limited by pain/activity tolerance)          Walk 10 feet activity   Assist  Walk 10 feet activity did not occur: Safety/medical concerns (limited by pain/activity tolerance)        Walk 50 feet activity   Assist Walk 50 feet with 2 turns activity did not occur: Safety/medical concerns (limited by pain/activity tolerance)         Walk 150 feet activity   Assist Walk 150 feet activity did not occur: Safety/medical concerns (limited by pain/activity tolerance)         Walk 10 feet on uneven surface  activity   Assist Walk 10 feet on uneven surfaces activity did not occur: Safety/medical concerns (limited by pain/activity tolerance)         Wheelchair     Assist Is the patient using a wheelchair?: Yes Type of Wheelchair: Manual Wheelchair activity did not occur: Safety/medical concerns (fatigue/endurance/pain)         Wheelchair 50 feet with 2 turns activity    Assist    Wheelchair 50 feet with 2 turns activity did not occur: Safety/medical concerns       Wheelchair 150 feet activity     Assist  Wheelchair 150 feet activity did not occur: Safety/medical concerns       Blood pressure 125/61, pulse 79, temperature 98.2 F (36.8 C), temperature source Oral, resp. rate 17, height '5\' 6"'$  (1.676 m), weight 113.6 kg, SpO2 94 %.  Medical Problem List and Plan: 1. Functional deficits secondary to right comminuted subtrochanteric proximal femur fracture.  Status post intramedullary nailing of right hip 11/26/2022 (Dr. Marcelino Scot).  Weightbearing as tolerated with walker, unrestricted ROM R hip/knee, f/up outpatient with ortho in osteoporosis clinic             -patient may shower             -ELOS/Goals: 10-14 days, SPV PT/OT  D/c date 12/13/22  Con't CIR- PT , OT - pt says doing more  than did "the day before"- reminded pt need to get  pain better so can put RLE on floor better 2.  Antithrombotics: -DVT/anticoagulation:  Pharmaceutical: Eliquis 2.'5mg'$  BID - ppx for 30 days post-op (11/26/22-12/26/22).   -12/01/22 Dopplers negative for DVT -antiplatelet therapy: N/A 3. Pain Management: Tylenol 325-'650mg'$  q6h PRN, Oxycodone 5-'10mg'$  q4h PRN and Robaxin '500mg'$  q6h PRN -Chronic chemotherapy induced peripheral neuropathy - stable, no additional medications req 1/17- pain tolerable, but wanted to switch Oxy to Norco- we discussed would have to reduce amount of tylenol she gets since has tylenol in it- will wait til tomorrow to reassess 4. Mood/Behavior/Sleep: Provide emotional support -antipsychotic agents: N/A 5. Neuropsych/cognition: This patient is capable of making decisions on her own behalf. 6. Skin/Wound Care: Routine skin checks -11/30/22 Spoke with Ainsley Spinner PA-C for OTS, states can have daily dressing changes per nursing staff here, ok to shower and clean wounds with soap and water--ordered -12/01/22 made nursing aware of dressing change order daily   1/16- cont' daily dressing.  7. Fluids/Electrolytes/Nutrition: Routine in and outs with follow-up chemistries (weekly labs starting 12/02/22)  -Continue Vitamin C '1000mg'$  QD and vitamin D 2000IU BID -11/30/22 Monitor SCr, last value 1.06 on 11/28/22, encourage fluids and monitor -12/02/22 Cr 0.8 and Bun 24 stable, continue to monitor  8.  Acute blood loss anemia.  Hgb 10.6 on 11/28/22. Follow-up CBC on weekly labs starting 12/02/22 -1/15 HGB up to 12.0 9.  Morbid obesity.  BMI 38.94.  Dietary follow-up 10.  History of right breast cancer.  Follow-up outpatient. 11.  Constipation:  Colace BID, MiraLAX 17g QD PRN  -Per patient, LBM prior to admission during incident -Increase regimen to Sennakot-S 1 tab BID + miralax 17g QD, PRN MoM -11/30/22 doesn't feel constipated, but no BM since date of incident (11/24/22); added back Miralax 17g PRN in  addition to QD dosing, added Fleet enema QD PRN and Sorbitol 70% 30-64m QD PRN; monitor -12/01/22 BM today! Continue current regimen.  -1/15 will change senokot to daily   1/16- LBM yesterday  1/17- LBM 2 days ago- will d/c daily miralax since makes her bloated and full and increase Senna to 3 tabs daily.    I spent a total of 37   minutes on total care today- >50% coordination of care- due to  D/w pt about bowel meds- also d/w nursing; decided to stop miralax since makes her feel bloated and increase Senna; also d/w pt the risks/benefits of Oxy vs Norco- decided to wait to change meds  LOS: 5 days A FACE TO FACE EVALUATION WAS PERFORMED  Geoffrey Mankin 12/04/2022, 8:07 AM

## 2022-12-04 NOTE — Progress Notes (Signed)
Physical Therapy Session Note  Patient Details  Name: Terri Mayer MRN: [id number] Date of Birth: 1951-03-09  Today's Date: 12/04/2022 PT Individual Time: 3016-0109 + 1141-1155 + 1445+-1524 PT Individual Time Calculation (min): 57 min  + 14 min + 39 min  Short Term Goals: Week 1:  PT Short Term Goal 1 (Week 1): Pt will completed sit to stand with ModA + LRAD PT Short Term Goal 2 (Week 1): Pt will completed SPT with ModA + LRAD PT Short Term Goal 3 (Week 1): Pt will maintain dynamic standing with ModA + LRAD for 5 mins PT Short Term Goal 4 (Week 1): Pt will initiate ambulation activities  Skilled Therapeutic Interventions/Progress Updates:      1st session: Direct handoff of care from NT with patient in [place] at the sink, completing oral care. Pt reports she received her pain Rx ~30 minutes prior to arrival. Transferred to w/c in [place] for time.  In rehab gym, focused majority of session on gait training. Completes sit<>stands with CGA and RW - cues for hand placement. Pt favoring her LLE, keeps her RLE positioned in "kickstand" to reduce weight bearing.   Gait training during session 4f + 574f+ [id number] [id number] supervision and RW. Antalgic gait, decreased speed, decreased weight bearing on RLE. Extended seated rest breaks needed b/w efforts due to fatigue/pain.   In // bars, worked on lateral stepping L<>R, 6x8ft 38fh supervision and BUE support on bars. Encouraged weight shifting to her R to promote loading and tolerance. 2x20 heel/toe raises.   Returned to her room. Concluded session in w/c with all needs met. Pt requesting to stay sitting in w/c for upcoming OT session.   2nd session: Pt greeted in bed and in agreement to therapy session. Reports 4/10 R hip pain. Treatment to tolerance with rest breaks as needed for pain management.   Supine there-ex completed: -1x10 heel slides on R -1x20 ankle pumps on R -1x15 glut sets -1x15 SAQ w/ pillow bolster on R -1x15  isometric hip extension on R  Pt finished session in bed with all needs met, alarm on, call bell in lap  3rd session: Pt sitting in w/c and in agreement to therapy session. Reports she had difficulty with "lifting" her RLE in previous therapy session.   Transported in w/c to day room rehab gym for energy conservation.   Short distance gait training with supervision and RW, ~35ft.74ftinues to have antalgic gait pattern with decreased RLE weight shift and stance time but no knee buckling or LOB.  Standing toe taps on RLE only to 3inch platform with BUE support on RW. CGA for safety and completed 2x10 with seated rest break. Able to fully lift RLE on/off platform without assist, clearing toes.  Nustep completed x7 minutes at L3 resistance, BUE/BLE to target cardiovascular endurance and self controlled AAROM for RLE. Pt needing ++ time for setup on Nustep due to RLE discomfort but once positioned, patient comfortable.   Returned to her room and patient remained seated in w/c with all needs met, call bell in reach.   Therapy Documentation Precautions:  Precautions Precautions: Fall Restrictions Weight Bearing Restrictions: Yes RLE Weight Bearing: Weight bearing as tolerated General:   Therapy/Group: Individual Therapy  Jury Caserta P Babe Clenney PT 12/04/2022, 7:40 AM

## 2022-12-05 NOTE — Progress Notes (Signed)
PROGRESS NOTE   Subjective/Complaints:   Pt reports L 5th toe felt caught in sock- felt on fire- but better since sock readjusted.  Pain pretty bad this AM_ needs pain meds.  LBM yesterday   ROS:   Pt denies SOB, abd pain, CP, N/V/C/D, and vision changes  Except for HPI   Objective:   No results found. No results for input(s): "WBC", "HGB", "HCT", "PLT" in the last 72 hours.  No results for input(s): "NA", "K", "CL", "CO2", "GLUCOSE", "BUN", "CREATININE", "CALCIUM" in the last 72 hours.   Intake/Output Summary (Last 24 hours) at 12/05/2022 0902 Last data filed at 12/04/2022 1900 Gross per 24 hour  Intake 474 ml  Output --  Net 474 ml        Physical Exam: Vital Signs Blood pressure (!) 107/48, pulse 73, temperature 98 F (36.7 C), resp. rate 18, height '5\' 6"'$  (1.676 m), weight 113.6 kg, SpO2 95 %.     General: awake, alert, appropriate, sitting up in bed; NAD HENT: conjugate gaze; oropharynx moist CV: regular rate and rhythm; no JVD Pulmonary: CTA B/L; no W/R/R- good air movement GI: soft, NT, ND, (+)BS- more normoactive Psychiatric: appropriate Neurological: Ox3  Skin:  mild to moderate swelling, however incision have sutures- look great- a little dried blood, but no drainage or erythema;  Appropriately tender to palpation- today C/D/I- covered with bandage MSK:      Mild swelling around R thigh with some dark bruising discoloration.       Significant hip pain with attempted ROM of both the L and R thighs      Strength:                RUE: 5/5 SA, 5/5 EF, 5/5 EE, 5/5 WE, 5/5 FF, 5/5 FA                 LUE: 5/5 SA, 5/5 EF, 5/5 EE, 5/5 WE, 5/5 FF, 5/5 FA                 RLE: 2-/5 HF, 2+/5 KE, 5/5 DF, 5/5 EHL, 5/5 PF                 LLE:  2+/5 HF, 3+/5 KE, 5/5 DF, 5/5 EHL, 5/5 PF    Neurologic exam: Sitting up in bed.  Makes eye contact with examiner.  Follows full commands. Cognition: AAO to person,  place, time and event.  Language: Fluent, No substitutions or neoglisms. No dysarthria. Names 3/3 objects correctly.  Memory: Recalls 3/3 objects at 5 minutes. No apparent deficits  Insight: Good insight into current condition.  Mood: Pleasant affect, appropriate mood.  Sensation: To light touch reduced in bilateral feet and ankles Reflexes: 2+ in BL UE and LEs. Negative Hoffman's and babinski signs bilaterally.  CN: 2-12 grossly intact.  Coordination: No apparent tremors.  Spasticity: MAS 0 in all extremities.       Assessment/Plan: 1. Functional deficits which require 3+ hours per day of interdisciplinary therapy in a comprehensive inpatient rehab setting. Physiatrist is providing close team supervision and 24 hour management of active medical problems listed below. Physiatrist and rehab team continue to assess barriers  to discharge/monitor patient progress toward functional and medical goals  Care Tool:  Bathing    Body parts bathed by patient: Right arm, Left arm, Chest, Abdomen, Front perineal area, Right upper leg, Left upper leg, Face   Body parts bathed by helper: Buttocks, Right lower leg, Left lower leg     Bathing assist Assist Level: Minimal Assistance - Patient > 75%     Upper Body Dressing/Undressing Upper body dressing   What is the patient wearing?: Pull over shirt    Upper body assist Assist Level: Set up assist    Lower Body Dressing/Undressing Lower body dressing      What is the patient wearing?: Underwear/pull up, Pants     Lower body assist Assist for lower body dressing: Minimal Assistance - Patient > 75%     Toileting Toileting    Toileting assist Assist for toileting: Minimal Assistance - Patient > 75%     Transfers Chair/bed transfer  Transfers assist     Chair/bed transfer assist level: Maximal Assistance - Patient 25 - 49%     Locomotion Ambulation   Ambulation assist   Ambulation activity did not occur: Safety/medical  concerns (limited by pain/activity tolerance)          Walk 10 feet activity   Assist  Walk 10 feet activity did not occur: Safety/medical concerns (limited by pain/activity tolerance)        Walk 50 feet activity   Assist Walk 50 feet with 2 turns activity did not occur: Safety/medical concerns (limited by pain/activity tolerance)         Walk 150 feet activity   Assist Walk 150 feet activity did not occur: Safety/medical concerns (limited by pain/activity tolerance)         Walk 10 feet on uneven surface  activity   Assist Walk 10 feet on uneven surfaces activity did not occur: Safety/medical concerns (limited by pain/activity tolerance)         Wheelchair     Assist Is the patient using a wheelchair?: Yes Type of Wheelchair: Manual Wheelchair activity did not occur: Safety/medical concerns (fatigue/endurance/pain)         Wheelchair 50 feet with 2 turns activity    Assist    Wheelchair 50 feet with 2 turns activity did not occur: Safety/medical concerns       Wheelchair 150 feet activity     Assist  Wheelchair 150 feet activity did not occur: Safety/medical concerns       Blood pressure (!) 107/48, pulse 73, temperature 98 F (36.7 C), resp. rate 18, height '5\' 6"'$  (1.676 m), weight 113.6 kg, SpO2 95 %.  Medical Problem List and Plan: 1. Functional deficits secondary to right comminuted subtrochanteric proximal femur fracture.  Status post intramedullary nailing of right hip 11/26/2022 (Dr. Marcelino Scot).  Weightbearing as tolerated with walker, unrestricted ROM R hip/knee, f/up outpatient with ortho in osteoporosis clinic             -patient may shower             -ELOS/Goals: 10-14 days, SPV PT/OT  D/c date 12/13/22  Con't CIR- PT and OT 2.  Antithrombotics: -DVT/anticoagulation:  Pharmaceutical: Eliquis 2.'5mg'$  BID - ppx for 30 days post-op (11/26/22-12/26/22).   -12/01/22 Dopplers negative for DVT -antiplatelet therapy: N/A 3. Pain  Management: Tylenol 325-'650mg'$  q6h PRN, Oxycodone 5-'10mg'$  q4h PRN and Robaxin '500mg'$  q6h PRN -Chronic chemotherapy induced peripheral neuropathy - stable, no additional medications req 1/17- pain tolerable, but wanted to switch  Oxy to Norco- we discussed would have to reduce amount of tylenol she gets since has tylenol in it- will wait til tomorrow to reassess  1/18- pain worse this AM, but due for meds- will see how it goes 4. Mood/Behavior/Sleep: Provide emotional support -antipsychotic agents: N/A 5. Neuropsych/cognition: This patient is capable of making decisions on her own behalf. 6. Skin/Wound Care: Routine skin checks -11/30/22 Spoke with Ainsley Spinner PA-C for OTS, states can have daily dressing changes per nursing staff here, ok to shower and clean wounds with soap and water--ordered -12/01/22 made nursing aware of dressing change order daily   1/16- cont' daily dressing.  7. Fluids/Electrolytes/Nutrition: Routine in and outs with follow-up chemistries (weekly labs starting 12/02/22)  -Continue Vitamin C '1000mg'$  QD and vitamin D 2000IU BID -11/30/22 Monitor SCr, last value 1.06 on 11/28/22, encourage fluids and monitor -12/02/22 Cr 0.8 and Bun 24 stable, continue to monitor  8.  Acute blood loss anemia.  Hgb 10.6 on 11/28/22. Follow-up CBC on weekly labs starting 12/02/22 -1/15 HGB up to 12.0 9.  Morbid obesity.  BMI 38.94.  Dietary follow-up 10.  History of right breast cancer.  Follow-up outpatient. 11.  Constipation:  Colace BID, MiraLAX 17g QD PRN  -Per patient, LBM prior to admission during incident -Increase regimen to Sennakot-S 1 tab BID + miralax 17g QD, PRN MoM -11/30/22 doesn't feel constipated, but no BM since date of incident (11/24/22); added back Miralax 17g PRN in addition to QD dosing, added Fleet enema QD PRN and Sorbitol 70% 30-49m QD PRN; monitor -12/01/22 BM today! Continue current regimen.  -1/15 will change senokot to daily   1/16- LBM yesterday  1/17- LBM 2 days ago- will  d/c daily miralax since makes her bloated and full and increase Senna to 3 tabs daily.   1/18- LBM yesterday- feeling better    LOS: 6 days A FACE TO FACE EVALUATION WAS PERFORMED  Terri Mayer 12/05/2022, 9:02 AM

## 2022-12-05 NOTE — Progress Notes (Signed)
Physical Therapy Session Note  Patient Details  Name: Massie Cogliano MRN: [id number] Date of Birth: 1951-02-04  Today's Date: 12/05/2022 PT Individual Time: 0800-0900 PT Individual Time Calculation (min): 60 min   Short Term Goals: Week 1:  PT Short Term Goal 1 (Week 1): Pt will completed sit to stand with ModA + LRAD PT Short Term Goal 2 (Week 1): Pt will completed SPT with ModA + LRAD PT Short Term Goal 3 (Week 1): Pt will maintain dynamic standing with ModA + LRAD for 5 mins PT Short Term Goal 4 (Week 1): Pt will initiate ambulation activities  Skilled Therapeutic Interventions/Progress Updates:  Patient greeted semi-reclined in bed and agreeable to PT treatment session. Patient transitioned from semi-reclined to sitting EOB with use of bed rail and supv- Patient scooted closer to the foot of the bed with supv. Patient stood from EOB with RW and use of footboard for improved leverage and CGA from therapist for safety. Patient then performed stand pivot transfer from EOB to wheelchair with RW and CGA. Patient propelled manual wheelchair from room to day room with B UE and supv.   Patient stood from wheelchair with RW and SBA and gait trained x52' and x6' with SBA for safety- Patient demonstrated an antalgic gait pattern with significantly decreased cadence and increased use of B UE. Compensatory strategies of swinging hip thorugh with trunk rotation instead of activating muscles  Seated therex performed in order to improve R LE ROM and strength for improved functional mobility and in order to decrease pain- R LE marches, 2 x 10 AROM with limited foot clearance Attempted to performed R LAQ, however patient was unable to lift foot off the floor secondary to increased pain.   Patient returned to her room sitting upright in wheelchair with tray table nearby and all needs met. RN notified regarding patient's request for pain medicine prior to next session.    Therapy  Documentation Precautions:  Precautions Precautions: Fall Restrictions Weight Bearing Restrictions: Yes RLE Weight Bearing: Weight bearing as tolerated   Therapy/Group: Individual Therapy  Lavaughn Bisig 12/05/2022, 7:55 AM

## 2022-12-05 NOTE — Progress Notes (Signed)
Physical Therapy Session Note  Patient Details  Name: Terri Mayer MRN: [id number] Date of Birth: August 22, 1951  Today's Date: 12/05/2022 PT Individual Time: 0930-1028 PT Individual Time Calculation (min): 58 min   Short Term Goals: Week 1:  PT Short Term Goal 1 (Week 1): Pt will completed sit to stand with ModA + LRAD PT Short Term Goal 2 (Week 1): Pt will completed SPT with ModA + LRAD PT Short Term Goal 3 (Week 1): Pt will maintain dynamic standing with ModA + LRAD for 5 mins PT Short Term Goal 4 (Week 1): Pt will initiate ambulation activities  Skilled Therapeutic Interventions/Progress Updates:      Pt sitting in w/c to start - agreeable to PT tx. Reports frustration that she didn't have a "productive" AM session with PT due to pain and stiffness. Reports she received pain Rx this AM but was too close to PT to allow time to kick in. Pt requesting start times to be pushed back to 0830 to allow time for pain medication to be in her system for therapies. Scheduler notified of patient request.   Transported in w/c to main rehab gym for time.   Positioned in front of 3inch steps. Worked on RLE toe taps to 3inch step (2x10) and then able to progress to LLE toe taps to 3inch step (2x10). All completed with supervision with 2 hand rails - pt relying quite heavily on rails for support and for offloading weight on RLE.   Initiated stair training using the same 3inch steps and 2 hand rails. She navigated up/down x8 steps with supervision - completed with step-to pattern while forward facing. Min cues for sequencing, relying heavily on hand rails for support and offloading.   Instructed in gait training ~4f with supervision and RW - antalgic gait with decreased R weight shifting and stance time. Step-through gait pattern without LOB or knee buckling. Gait distance limited by fatigue and pain. Slow gait speed and occasional brief standing rest breaks.  Returned to her room - concluded session  seated in w/c with call bell in reach and all needs met.    Therapy Documentation Precautions:  Precautions Precautions: Fall Restrictions Weight Bearing Restrictions: Yes RLE Weight Bearing: Weight bearing as tolerated General:      Therapy/Group: Individual Therapy  Noele Icenhour P Beatric Fulop PT 12/05/2022, 7:40 AM

## 2022-12-05 NOTE — Progress Notes (Signed)
Occupational Therapy Session Note  Patient Details  Name: Terri Mayer MRN: [id number] Date of Birth: Jan 19, 1951  Today's Date: 12/05/2022 OT Individual Time: 1300-1415 OT Individual Time Calculation (min): 75 min    Short Term Goals: Week 1:  OT Short Term Goal 1 (Week 1): Pt will perform toilet transfer with Mod A + LRAD. OT Short Term Goal 2 (Week 1): Pt will complete LB dressing with Mod A + LRAD. OT Short Term Goal 3 (Week 1): Pt will complete clothing management with Mod A + LRAD during toileting. OT Short Term Goal 4 (Week 1): Pt will complete tub/shower transfer with Mod A + LRAD.  Skilled Therapeutic Interventions/Progress Updates:   Pt seen for skilled OT services with focus on home accessibility for discharge in the bathroom. Pt reported 3/10 hip pain with meds given earlier. Pt transported for time mngt to demo apt space and showed OT photos of spaces and OT has measurement sheet in hand. Pt agreeable to use tub shower initially then work with [place] to access stall when more mobility. OT measured RW and with wheels inverted will clear master bathroom door and with wheels standard can clear guest bathroom where she will bathe. Pt amb with RW from w/c to TTB set in tub and able to maneuver and complete in and out with mod fading to minimal assist. Pt motivated to manage her r LE in and out using leg lifter. OT recommended vertical grab bar on fixture wall nearer to exit and horizontal bar on inside wall. Pt contacting her contractor to install asap.  OT briefly touched on curtain adaptation to manage water to avoid slip/fall- will elaborate closer to d/c. Pt already has [place] shower hose. Pt then amb from TTB to regular 3 in 1 set over toilet with enough clearance to mirror her own set up. Off and on with CGA. Once back in room, pt able to amb in and out of bathroom and use grab bars and commode over top toilet to void. Min A for hygiene. Amb with RW with CGA 15 ft back to bed with min A  for bed mobility using leg lifter. Left pt with bed exit alarm set, needs and nurse call button in place.    Therapy Documentation Precautions:  Precautions Precautions: Fall Restrictions Weight Bearing Restrictions: Yes RLE Weight Bearing: Weight bearing as tolerated    Therapy/Group: Individual Therapy  Barnabas Lister 12/05/2022, 7:56 AM

## 2022-12-06 MED ORDER — SENNOSIDES-DOCUSATE SODIUM 8.6-50 MG PO TABS
2.0000 | ORAL_TABLET | Freq: Every day | ORAL | Status: DC
  Administered 2022-12-07 – 2022-12-13 (×7): 2 via ORAL
  Filled 2022-12-06 (×7): qty 2

## 2022-12-06 NOTE — Evaluation (Signed)
Recreational Therapy Assessment and Plan  Patient Details  Name: Terri Mayer MRN: [id number] Date of Birth: Feb 27, 1951 Today's Date: 12/06/2022  Rehab Potential:  Good ELOS:   d/c 1/26  Assessment Hospital Problem: Principal Problem:   Intertrochanteric fracture of right hip [facility])     Past Medical History:      Past Medical History:  Diagnosis Date   Allergy     Breast cancer ([place])     History of chicken pox     Osteoarthritis      Neck,Back,Knees,Hips,Ankles   Personal history of chemotherapy     Personal history of radiation therapy      Past Surgical History:       Past Surgical History:  Procedure Laterality Date   BREAST LUMPECTOMY Right 2017   DILATION AND CURETTAGE OF UTERUS       EYE SURGERY   at age 66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   INTRAMEDULLARY (IM) NAIL INTERTROCHANTERIC Right 11/26/2022    Procedure: INTRAMEDULLARY (IM) NAIL INTERTROCHANTERIC;  Surgeon: Altamese Chesterbrook, MD;  Location: [place];  Service: Orthopedics;  Laterality: Right;   PORTACATH PLACEMENT Right 12/11/2015    Procedure: INSERTION PORT-A-CATH WITH ULTRASOUND;  Surgeon: Rolm Bookbinder, MD;  Location: [place];  Service: General;  Laterality: Right;   RADIOACTIVE SEED GUIDED PARTIAL MASTECTOMY/AXILLARY SENTINEL NODE BIOPSY/AXILLARY NODE DISSECTION Right 05/02/2016    Procedure: RIGHT BREAST SEED GUIDED LUMPECTOMY AND RIGHT SEED TARGETED AXILLARY DISSECTION,AND SENTINEL NODE BIOPSY;  Surgeon: Rolm Bookbinder, MD;  Location: [place];  Service: General;  Laterality: Right;  RIGHT BREAST SEED GUIDED LUMPECTOMY AND RIGHT SEED TARGETED AXILLARY DISSECTION,AND SENTINEL NODE BIOPSY   WISDOM TOOTH EXTRACTION          Assessment & Plan Clinical Impression: Patient is a 72 y.o. year old female who presented 11/25/2022 after she reportedly tripped and fell while putting away Christmas decorations.  She denied loss of consciousness.  Noted immediate pain to the right leg.   Patient was down an extended time until found by neighbor. Cranial CT scan as well as CT cervical spine negative. X-rays and imaging showed right comminuted subtrochanteric proximal femur fracture. Admission chemistries unremarkable except WBC 23,200, alcohol negative, lactic acid 3.4-2.4, CK5 54, D-dimer 1.62.  Patient was cleared for surgery underwent intramedullary nailing of right hip 11/26/2022 per Dr. Altamese Cumberland.  Weightbearing as tolerated right lower extremity. Patient transferred to CIR on 11/29/2022.  Pt presents with decreased activity tolerance, decreased functional mobility, decreased balance, feelings of stress Limiting pt's independence with leisure/community pursuits.  Met with pt today to discuss TR services including leisure education, activity analysis/modifications and stress management.  Also discussed the importance of social, emotional, spiritual health in addition to physical health and their effects on overall health and wellness.  Pt stated understanding.  Plan   Min 1 session during LOS Recommendations for other services: None   Discharge Criteria: Patient will be discharged from TR if patient refuses treatment 3 consecutive times without medical reason.  If treatment goals not met, if there is a change in medical status, if patient makes no progress towards goals or if patient is discharged from hospital.  The above assessment, treatment plan, treatment alternatives and goals were discussed and mutually agreed upon: by patient  [place] 12/06/2022, 11:05 AM

## 2022-12-06 NOTE — Progress Notes (Signed)
Physical Therapy Session Note  Patient Details  Name: Terri Mayer MRN: [id number] Date of Birth: [birth year]  Today's Date: 12/06/2022 PT Individual Time: 1350-1459 PT Individual Time Calculation (min): 69 min   Short Term Goals: Week 1:  PT Short Term Goal 1 (Week 1): Pt will completed sit to stand with ModA + LRAD PT Short Term Goal 2 (Week 1): Pt will completed SPT with ModA + LRAD PT Short Term Goal 3 (Week 1): Pt will maintain dynamic standing with ModA + LRAD for 5 mins PT Short Term Goal 4 (Week 1): Pt will initiate ambulation activities  Skilled Therapeutic Interventions/Progress Updates:     Pt received seated in [facility] and agrees to therapy. Reports management pain in R lower extremity, and reports having pain meds shortly before session. WC transport to gym for time management. PT adjusts pt's RW for optimal height and provides education on body mechanics and AD use. Pt performs sit to stand with CGA and cues for hand placement and body mechanics. Pt ambulates x90' with RW and CGA, with cues to decrease WB through RW for energy conservation and increased loading through lower extremities. Cues as well to increase stance time on R and stride length on L for reciprocal gait pattern. Following seated rest break, pt stand and ambulate 275' with RW and similar assistance and cues. Pt noted to have increasing forward flexion and WB through RW as distance increases. Pt takes extended seated rest break. Pt then stands and ambulates x200' back to room with RW and same cues. Left seated in recliner with legs elevated and all needs within reach.   Therapy Documentation Precautions:  Precautions Precautions: Fall Restrictions Weight Bearing Restrictions: Yes RLE Weight Bearing: Weight bearing as tolerated  Therapy/Group: Individual Therapy  Breck Coons, PT, DPT 12/06/2022, 4:53 PM

## 2022-12-06 NOTE — Progress Notes (Signed)
Occupational Therapy Weekly Progress Note  Patient Details  Name: Terri Mayer MRN: [id number] Date of Birth: February 20, 1951  Beginning of progress report period: November 30, 2022 End of progress report period: December 06, 2022  Today's Date: 12/06/2022 OT Individual Time: 7169-6789 OT Individual Time Calculation (min): 59 min    Patient has met 4 of 4 short term goals.  Pt demonstrated significant pain and functional support needs at eval and has since made excellent gains toward progress this week. OT has progressed pt to shower level and use of RW to access bathroom for toileting and shower needs. AE training ongoing for LB self care and simulated tub and toilet access based on home measurements addressed. MSW has ordered DME and pt has ordered several AD online including bed rail, dressing stick and reacher online. LH sponge issued and trained and RW bag trng a priority. Pt has a sock aide.   Patient continues to demonstrate the following deficits: muscle weakness and muscle joint tightness, decreased cardiorespiratoy endurance, and decreased standing balance, decreased postural control, and decreased balance strategies and therefore will continue to benefit from skilled OT intervention to enhance overall performance with BADL, iADL, and Reduce care partner burden.  Patient progressing toward long term goals..  Continue plan of care.  OT Short Term Goals Week 1:  OT Short Term Goal 1 (Week 1): Pt will perform toilet transfer with Mod A + LRAD. OT Short Term Goal 1 - Progress (Week 1): Met OT Short Term Goal 2 (Week 1): Pt will complete LB dressing with Mod A + LRAD. OT Short Term Goal 2 - Progress (Week 1): Met OT Short Term Goal 3 (Week 1): Pt will complete clothing management with Mod A + LRAD during toileting. OT Short Term Goal 3 - Progress (Week 1): Met OT Short Term Goal 4 (Week 1): Pt will complete tub/shower transfer with Mod A + LRAD. OT Short Term Goal 4 - Progress (Week 1):  Met Week 2:  OT Short Term Goal 1 (Week 2): STG=LTG's d/t LOS  Skilled Therapeutic Interventions/Progress Updates:   Pt seen for AM skilled OT visit with focus on shower and full self care retraining session with DME and AE training. 3/10 pain R hip region with pain meds given earlier.  Pt was able to move supine to sit with CGA and increased time with bed rail use and progress to sit to stand with RW for amb to bathroom for toileting and showers with CGA. Pt was able to use reacher to doff all LB clothing and complete peri hygiene- needs continued trng for reach to buttocks. Amb from toilet to TTB simulating side access as if using tub shower and grab bars with CGA. Pt completed all bathing seated and standing level with use LH sponge except peri/anal region. Amb to w/c set in front of sink for dressing and grooming routine. Pt now indep with tshirt and all grooming seated level. LB dressing with reacher, sock aide and RW for support to standing with min A-CGA. Pt left in the care of NT and nursing to change R hip incision bandages.    Therapy Documentation Precautions:  Precautions Precautions: Fall Restrictions Weight Bearing Restrictions: Yes RLE Weight Bearing: Weight bearing as tolerated    Therapy/Group: Individual Therapy  Barnabas Lister 12/06/2022, 12:32 PM

## 2022-12-06 NOTE — Progress Notes (Signed)
Occupational Therapy Session Note  Patient Details  Name: Terri Mayer MRN: [id number] Date of Birth: Jan 25, 1951  Today's Date: 12/06/2022 OT Individual Time: 1100-1202 OT Individual Time Calculation (min): 62 min   Short Term Goals: Week 1:  OT Short Term Goal 1 (Week 1): Pt will perform toilet transfer with Mod A + LRAD. OT Short Term Goal 2 (Week 1): Pt will complete LB dressing with Mod A + LRAD. OT Short Term Goal 3 (Week 1): Pt will complete clothing management with Mod A + LRAD during toileting. OT Short Term Goal 4 (Week 1): Pt will complete tub/shower transfer with Mod A + LRAD.  Skilled Therapeutic Interventions/Progress Updates:     Pt received sitting in wc presenting to be in good spirits and receptive to skilled OT session. Pt reporting 2/10 chronic pain- rest breaks and repositioning to accommodate for pain. Pt receptive to skilled OT session.  Pt politely refusing need for BADLs during session. Case manager in/out during session to check in on pt/provided updates on DME requests. Pt and OT discussed recommended DME for d/c to increase safety including RW, 3/in1 BSC, tub bench. Pt receptive to DME and reporting she desires increased practice in completing transfers to increase safety upon d/c home. Pt and OT discussed home set-up and Pt role within the home to complete IADL tasks. Pt educated on energy conservation and fall prevention to increase safety upon d/c home.  Pt transported to ADL apartment for transfer and functional mobility training. Pt able to complete sit>stand using RW and ambulate to tub with CGA +time. Pt mildly anxious during tub bench transfer with increased education provided on technique and safety. Pt and OT discussed Pt bathroom set-up at home and problem solved placement of grab bars to increase safety and facilitate optimal participation. Pt transferred <> tub bench using leg lifter to bring R leg in/out of the tub with CGA. Pt provided rest break  following d/t pain in R knee/hip.  Pt ambulated to kitchen using RW CGA, but required additional seated rest break d/t pain. Pt educated on gentle stretches/exercises to complete to reduce pain with Pt reporting decrease in symptoms following.  Pt educated on functional mobility in kitchen and modifications to increase safety. Pt completed kitchen mobility task ambulating with her RW CGA while reaching into high/low cabinets to retrieve light weight items. Pt reporting she would like continued practice following tasks. Pt transported back to room total a in wc. Stand pivot using RW>EOB CGA. Pt min A back to bed to lift legs. Pt was left resting in bed with call bell in reach, bed alarm on, and all needs met. Pt calling nurse at end of session for pain medications.   Therapy Documentation Precautions:  Precautions Precautions: Fall Restrictions Weight Bearing Restrictions: Yes RLE Weight Bearing: Weight bearing as tolerated General:   Vital Signs:  Pain: Pain Assessment Pain Scale: 0-10 Pain Score: 3  Pain Location: Hip Pain Orientation: Right Pain Intervention(s): Medication (See eMAR) ADL: ADL Equipment Provided: Leg lifter Eating: Modified independent Where Assessed-Eating: Bed level Upper Body Bathing: Setup Where Assessed-Upper Body Bathing: Edge of bed Lower Body Bathing: Minimal assistance Where Assessed-Lower Body Bathing: Edge of bed Upper Body Dressing: Setup Where Assessed-Upper Body Dressing: Edge of bed Lower Body Dressing: Moderate assistance Where Assessed-Lower Body Dressing: Edge of bed Toileting: Minimal assistance (Wearing hospital gown.) Where Assessed-Toileting: Bedside Commode Toilet Transfer: Moderate assistance Toilet Transfer Method: Stand pivot Science writer: Geophysical data processor: Moderate assistance  Walk-In Banker Method: Radiographer, therapeutic: Radio broadcast assistant ADL Comments: Pt greatly  impacted by pain, requiring increased time for moving, clinical judgement used for ADL participation.   Therapy/Group: Individual Therapy  Janey Genta 12/06/2022, 11:10 AM

## 2022-12-06 NOTE — Progress Notes (Signed)
Patient ID: Terri Mayer, female   DOB: [birth year], 72 y.o.   MRN: [id number]  Discussed equipment with pt and she ws in agreement and aware tub bench is not covered. Made referral to [employer] and she has a co-pay of $88.00 will have son call and pay the co-pay so can deliver equipment.

## 2022-12-06 NOTE — Progress Notes (Signed)
PROGRESS NOTE   Subjective/Complaints:  Pt reports L5th toe tender, but much better than yesterday.   Had a sloppy/extremely large BM last evening- wants to decrease Senna.   Used RW some to get to bathroom yesterday.    ROS:   Pt denies SOB, abd pain, CP, N/V/C/D, and vision changes  Except for HPI   Objective:   No results found. No results for input(s): "WBC", "HGB", "HCT", "PLT" in the last 72 hours.  No results for input(s): "NA", "K", "CL", "CO2", "GLUCOSE", "BUN", "CREATININE", "CALCIUM" in the last 72 hours.   Intake/Output Summary (Last 24 hours) at 12/06/2022 0842 Last data filed at 12/06/2022 0729 Gross per 24 hour  Intake 1020 ml  Output 2 ml  Net 1018 ml        Physical Exam: Vital Signs Blood pressure (!) 115/51, pulse 75, temperature 98.8 F (37.1 C), temperature source Oral, resp. rate 18, height '5\' 6"'$  (1.676 m), weight 113.6 kg, SpO2 96 %.      General: awake, alert, appropriate,in stedy at sink doing grooming/brushing teeth; NT in room;  NAD HENT: conjugate gaze; oropharynx moist CV: regular rate and rhythm; no JVD Pulmonary: CTA B/L; no W/R/R- good air movement GI: soft, NT, ND, (+)BS- normoactive Psychiatric: appropriate- interactive Neurological: Ox3  Skin:  mild to moderate swelling, however incision have sutures- look great- a little dried blood, but no drainage or erythema;  Appropriately tender to palpation- today C/D/I covered with bandage MSK:      Mild swelling around R thigh with some dark bruising discoloration.       Significant hip pain with attempted ROM of both the L and R thighs      Strength:                RUE: 5/5 SA, 5/5 EF, 5/5 EE, 5/5 WE, 5/5 FF, 5/5 FA                 LUE: 5/5 SA, 5/5 EF, 5/5 EE, 5/5 WE, 5/5 FF, 5/5 FA                 RLE: 2-/5 HF, 2+/5 KE, 5/5 DF, 5/5 EHL, 5/5 PF                 LLE:  2+/5 HF, 3+/5 KE, 5/5 DF, 5/5 EHL, 5/5 PF    Neurologic  exam: Sitting up in bed.  Makes eye contact with examiner.  Follows full commands. Cognition: AAO to person, place, time and event.  Language: Fluent, No substitutions or neoglisms. No dysarthria. Names 3/3 objects correctly.  Memory: Recalls 3/3 objects at 5 minutes. No apparent deficits  Insight: Good insight into current condition.  Mood: Pleasant affect, appropriate mood.  Sensation: To light touch reduced in bilateral feet and ankles Reflexes: 2+ in BL UE and LEs. Negative Hoffman's and babinski signs bilaterally.  CN: 2-12 grossly intact.  Coordination: No apparent tremors.  Spasticity: MAS 0 in all extremities.       Assessment/Plan: 1. Functional deficits which require 3+ hours per day of interdisciplinary therapy in a comprehensive inpatient rehab setting. Physiatrist is providing close team supervision and 24 hour  management of active medical problems listed below. Physiatrist and rehab team continue to assess barriers to discharge/monitor patient progress toward functional and medical goals  Care Tool:  Bathing    Body parts bathed by patient: Right arm, Left arm, Chest, Abdomen, Front perineal area, Right upper leg, Left upper leg, Face   Body parts bathed by helper: Buttocks, Right lower leg, Left lower leg     Bathing assist Assist Level: Minimal Assistance - Patient > 75%     Upper Body Dressing/Undressing Upper body dressing   What is the patient wearing?: Pull over shirt    Upper body assist Assist Level: Set up assist    Lower Body Dressing/Undressing Lower body dressing      What is the patient wearing?: Underwear/pull up, Pants     Lower body assist Assist for lower body dressing: Minimal Assistance - Patient > 75%     Toileting Toileting    Toileting assist Assist for toileting: Minimal Assistance - Patient > 75%     Transfers Chair/bed transfer  Transfers assist     Chair/bed transfer assist level: Maximal Assistance - Patient 25 -  49%     Locomotion Ambulation   Ambulation assist   Ambulation activity did not occur: Safety/medical concerns (limited by pain/activity tolerance)          Walk 10 feet activity   Assist  Walk 10 feet activity did not occur: Safety/medical concerns (limited by pain/activity tolerance)        Walk 50 feet activity   Assist Walk 50 feet with 2 turns activity did not occur: Safety/medical concerns (limited by pain/activity tolerance)         Walk 150 feet activity   Assist Walk 150 feet activity did not occur: Safety/medical concerns (limited by pain/activity tolerance)         Walk 10 feet on uneven surface  activity   Assist Walk 10 feet on uneven surfaces activity did not occur: Safety/medical concerns (limited by pain/activity tolerance)         Wheelchair     Assist Is the patient using a wheelchair?: Yes Type of Wheelchair: Manual Wheelchair activity did not occur: Safety/medical concerns (fatigue/endurance/pain)         Wheelchair 50 feet with 2 turns activity    Assist    Wheelchair 50 feet with 2 turns activity did not occur: Safety/medical concerns       Wheelchair 150 feet activity     Assist  Wheelchair 150 feet activity did not occur: Safety/medical concerns       Blood pressure (!) 115/51, pulse 75, temperature 98.8 F (37.1 C), temperature source Oral, resp. rate 18, height '5\' 6"'$  (1.676 m), weight 113.6 kg, SpO2 96 %.  Medical Problem List and Plan: 1. Functional deficits secondary to right comminuted subtrochanteric proximal femur fracture.  Status post intramedullary nailing of right hip 11/26/2022 (Dr. Marcelino Scot).  Weightbearing as tolerated with walker, unrestricted ROM R hip/knee, f/up outpatient with ortho in osteoporosis clinic             -patient may shower             -ELOS/Goals: 10-14 days, SPV PT/OT  D/c date 12/13/22  Con't CIR- PT and OT_ pt says now thinks will get out of here appropriately by 1/26-  said is walking some and tried stairs with PT.  2.  Antithrombotics: -DVT/anticoagulation:  Pharmaceutical: Eliquis 2.'5mg'$  BID - ppx for 30 days post-op (11/26/22-12/26/22).   -12/01/22 Dopplers  negative for DVT -antiplatelet therapy: N/A 3. Pain Management: Tylenol 325-'650mg'$  q6h PRN, Oxycodone 5-'10mg'$  q4h PRN and Robaxin '500mg'$  q6h PRN -Chronic chemotherapy induced peripheral neuropathy - stable, no additional medications req 1/17- pain tolerable, but wanted to switch Oxy to Norco- we discussed would have to reduce amount of tylenol she gets since has tylenol in it- will wait til tomorrow to reassess  1/18- pain worse this AM, but due for meds- will see how it goes 1/19- pt doesn't want to decrease Tylenol until next Mon/Tuesday- I explained that's fine, but will monitor CMP closely.  4. Mood/Behavior/Sleep: Provide emotional support -antipsychotic agents: N/A 5. Neuropsych/cognition: This patient is capable of making decisions on her own behalf. 6. Skin/Wound Care: Routine skin checks -11/30/22 Spoke with Ainsley Spinner PA-C for [facility], states can have daily dressing changes per nursing staff here, ok to shower and clean wounds with soap and water--ordered -12/01/22 made nursing aware of dressing change order daily   1/16- cont' daily dressing.  7. Fluids/Electrolytes/Nutrition: Routine in and outs with follow-up chemistries (weekly labs starting 12/02/22)  -Continue Vitamin C '1000mg'$  QD and vitamin D 2000IU BID -11/30/22 Monitor SCr, last value 1.06 on 11/28/22, encourage fluids and monitor -12/02/22 Cr 0.8 and Bun 24 stable, continue to monitor  8.  Acute blood loss anemia.  Hgb 10.6 on 11/28/22. Follow-up CBC on weekly labs starting 12/02/22 -1/15 HGB up to 12.0  1/19- labs weekly 9.  Morbid obesity.  BMI 38.94.  Dietary follow-up 10.  History of right breast cancer.  Follow-up outpatient. 11.  Constipation:  Colace BID, MiraLAX 17g QD PRN  -Per patient, LBM prior to admission during incident -Increase  regimen to Sennakot-S 1 tab BID + miralax 17g QD, PRN MoM -11/30/22 doesn't feel constipated, but no BM since date of incident (11/24/22); added back Miralax 17g PRN in addition to QD dosing, added Fleet enema QD PRN and Sorbitol 70% 30-81m QD PRN; monitor -12/01/22 BM today! Continue current regimen.  -1/15 will change senokot to daily   1/16- LBM yesterday  1/17- LBM 2 days ago- will d/c daily miralax since makes her bloated and full and increase Senna to 3 tabs daily.   1/18- LBM yesterday- feeling better  1/19- extremely large BM last night- pt asked to move Senna back down to 2 tabs- will do so.    LOS: 7 days A FACE TO FACE EVALUATION WAS PERFORMED  Aashish Hamm 12/06/2022, 8:42 AM

## 2022-12-07 NOTE — Progress Notes (Signed)
PROGRESS NOTE   Subjective/Complaints:  Doing well today. Pain well controlled, slept well, and urinating well. Had a BM 2 days ago (large and mushy), doesn't feel stopped up so doesn't want to change things for now but wants to monitor for now. Otherwise no complaints.    ROS: +constipation. Pt denies CP, SOB, abd pain, N/V/D, new numbness/tingling/weakness, or vision changes    Objective:   No results found. No results for input(s): "WBC", "HGB", "HCT", "PLT" in the last 72 hours.  No results for input(s): "NA", "K", "CL", "CO2", "GLUCOSE", "BUN", "CREATININE", "CALCIUM" in the last 72 hours.   Intake/Output Summary (Last 24 hours) at 12/07/2022 0718 Last data filed at 12/06/2022 2105 Gross per 24 hour  Intake 840 ml  Output 1 ml  Net 839 ml         Physical Exam: Vital Signs Blood pressure (!) 116/59, pulse 80, temperature 98.4 F (36.9 C), resp. rate 17, height '5\' 6"'$  (1.676 m), weight 113.6 kg, SpO2 96 %.   General: awake, alert, appropriate, in chair; NT in room;  NAD HENT: conjugate gaze; oropharynx moist CV: regular rate and rhythm; no JVD Pulmonary: CTA B/L; no W/R/R- good air movement GI: soft, NT, ND, (+)BS- normoactive Psychiatric: appropriate- interactive Neurological: Ox3  Skin:  mild to moderate swelling, however incision have sutures- look great- a little dried blood, but no drainage or erythema;  Appropriately tender to palpation- today C/D/I covered with bandage, not removed during exam MSK:      Mild swelling around R thigh with some dark bruising discoloration--improving      Moderate hip pain with attempted ROM of both the L and R thighs      Strength:                RUE: 5/5 SA, 5/5 EF, 5/5 EE, 5/5 WE, 5/5 FF, 5/5 FA                 LUE: 5/5 SA, 5/5 EF, 5/5 EE, 5/5 WE, 5/5 FF, 5/5 FA                 RLE: 2-/5 HF, 2+/5 KE, 5/5 DF, 5/5 EHL, 5/5 PF                 LLE:  2+/5 HF, 3+/5 KE, 5/5  DF, 5/5 EHL, 5/5 PF    Neurologic exam: Sitting up in bed.  Makes eye contact with examiner.  Follows full commands. Cognition: AAO to person, place, time and event.  Language: Fluent, No substitutions or neoglisms. No dysarthria. Names 3/3 objects correctly.  Memory: Recalls 3/3 objects at 5 minutes. No apparent deficits  Insight: Good insight into current condition.  Mood: Pleasant affect, appropriate mood.  Sensation: To light touch reduced in bilateral feet and ankles Reflexes: 2+ in BL UE and LEs. Negative Hoffman's and babinski signs bilaterally.  CN: 2-12 grossly intact.  Coordination: No apparent tremors.  Spasticity: MAS 0 in all extremities.       Assessment/Plan: 1. Functional deficits which require 3+ hours per day of interdisciplinary therapy in a comprehensive inpatient rehab setting. Physiatrist is providing close team supervision and 24 hour  management of active medical problems listed below. Physiatrist and rehab team continue to assess barriers to discharge/monitor patient progress toward functional and medical goals  Care Tool:  Bathing    Body parts bathed by patient: Right arm, Left arm, Chest, Abdomen, Front perineal area, Right upper leg, Left upper leg, Face, Right lower leg, Left lower leg   Body parts bathed by helper: Buttocks     Bathing assist Assist Level: Minimal Assistance - Patient > 75%     Upper Body Dressing/Undressing Upper body dressing   What is the patient wearing?: Pull over shirt    Upper body assist Assist Level: Independent    Lower Body Dressing/Undressing Lower body dressing      What is the patient wearing?: Underwear/pull up, Pants     Lower body assist Assist for lower body dressing: Contact Guard/Touching assist     Toileting Toileting    Toileting assist Assist for toileting: Minimal Assistance - Patient > 75%     Transfers Chair/bed transfer  Transfers assist     Chair/bed transfer assist level: Maximal  Assistance - Patient 25 - 49%     Locomotion Ambulation   Ambulation assist   Ambulation activity did not occur: Safety/medical concerns (limited by pain/activity tolerance)          Walk 10 feet activity   Assist  Walk 10 feet activity did not occur: Safety/medical concerns (limited by pain/activity tolerance)        Walk 50 feet activity   Assist Walk 50 feet with 2 turns activity did not occur: Safety/medical concerns (limited by pain/activity tolerance)         Walk 150 feet activity   Assist Walk 150 feet activity did not occur: Safety/medical concerns (limited by pain/activity tolerance)         Walk 10 feet on uneven surface  activity   Assist Walk 10 feet on uneven surfaces activity did not occur: Safety/medical concerns (limited by pain/activity tolerance)         Wheelchair     Assist Is the patient using a wheelchair?: Yes Type of Wheelchair: Manual Wheelchair activity did not occur: Safety/medical concerns (fatigue/endurance/pain)         Wheelchair 50 feet with 2 turns activity    Assist    Wheelchair 50 feet with 2 turns activity did not occur: Safety/medical concerns       Wheelchair 150 feet activity     Assist  Wheelchair 150 feet activity did not occur: Safety/medical concerns       Blood pressure (!) 116/59, pulse 80, temperature 98.4 F (36.9 C), resp. rate 17, height '5\' 6"'$  (1.676 m), weight 113.6 kg, SpO2 96 %.  Medical Problem List and Plan: 1. Functional deficits secondary to right comminuted subtrochanteric proximal femur fracture.  Status post intramedullary nailing of right hip 11/26/2022 (Dr. Marcelino Scot).  Weightbearing as tolerated with walker, unrestricted ROM R hip/knee, f/up outpatient with ortho in osteoporosis clinic             -patient may shower             -ELOS/Goals: 10-14 days, SPV PT/OT  -D/c date 12/13/22 -Con't CIR- PT and OT_ pt says now thinks will get out of here appropriately by 1/26-  said is walking some and tried stairs with PT.  2.  Antithrombotics: -DVT/anticoagulation:  Pharmaceutical: Eliquis 2.'5mg'$  BID - ppx for 30 days post-op (11/26/22-12/26/22).   -12/01/22 Dopplers negative for DVT -antiplatelet therapy: N/A 3. Pain Management:  Tylenol 325-'650mg'$  q6h PRN, Oxycodone 5-'10mg'$  q4h PRN and Robaxin '500mg'$  q6h PRN -Chronic chemotherapy induced peripheral neuropathy - stable, no additional medications req -1/17- pain tolerable, but wanted to switch Oxy to Norco- we discussed would have to reduce amount of tylenol she gets since has tylenol in it- will wait til tomorrow to reassess -1/18- pain worse this AM, but due for meds- will see how it goes -1/19- pt doesn't want to decrease Tylenol until next Mon/Tuesday- I explained that's fine, but will monitor CMP closely.  -12/07/22 pain controlled, continue to monitor; ordered weekly labs (CMP/CBC) starting 12/09/22 4. Mood/Behavior/Sleep: Provide emotional support -antipsychotic agents: N/A 5. Neuropsych/cognition: This patient is capable of making decisions on her own behalf. 6. Skin/Wound Care: Routine skin checks -11/30/22 Spoke with Ainsley Spinner PA-C for [facility], states can have daily dressing changes per nursing staff here, ok to shower and clean wounds with soap and water--ordered -12/01/22 made nursing aware of dressing change order daily   -12/07/22 continue daily dressing, reminded nursing of DAILY changes 7. Fluids/Electrolytes/Nutrition: Routine in and outs with follow-up chemistries (weekly labs starting 12/02/22)  -Continue Vitamin C '1000mg'$  QD and vitamin D 2000IU BID -11/30/22 Monitor SCr, last value 1.06 on 11/28/22, encourage fluids and monitor -12/02/22 Cr 0.8 and Bun 24 stable, continue to monitor on weekly labs (12/09/22)  8.  Acute blood loss anemia.  Hgb 10.6 on 11/28/22. Follow-up CBC on weekly labs starting 12/02/22 -1/15 HGB up to 12.0  -1/19- labs weekly, ordered for 12/09/22 9.  Morbid obesity.  BMI 38.94.  Dietary  follow-up 10.  History of right breast cancer.  Follow-up outpatient. 11.  Constipation:  Colace BID, MiraLAX 17g QD PRN  -Per patient, LBM prior to admission during incident -Increase regimen to Sennakot-S 1 tab BID + miralax 17g QD, PRN MoM -11/30/22 doesn't feel constipated, but no BM since date of incident (11/24/22); added back Miralax 17g PRN in addition to QD dosing, added Fleet enema QD PRN and Sorbitol 70% 30-68m QD PRN; monitor -12/01/22 BM today! Continue current regimen.  -1/15 will change senokot to daily   -1/16- LBM yesterday -1/17- LBM 2 days ago- will d/c daily miralax since makes her bloated and full and increase Senna to 3 tabs daily.   -1/18- LBM yesterday- feeling better -1/19- extremely large BM last night- pt asked to move Senna back down to 2 tabs- will do so.  -12/07/22 LBM 12/05/22 but doesn't feel constipated, wants to stick with current regimen for now (Senokot 2 tabs QD, MoM 159mQD PRN, Miralax 17g QD PRN, Sorbitol 70% 30-6071mD PRN)   LOS: 8 days A FACE TO FACSt. [place]20/2024, 7:18 AM

## 2022-12-07 NOTE — Progress Notes (Addendum)
Physical Therapy Session Note  Patient Details  Name: Terri Mayer MRN: [id number] Date of Birth: 1951-02-25  Today's Date: 12/07/2022 PT Individual Time: 1002-1112  Short Term Goals: Week 1:  PT Short Term Goal 1 (Week 1): Pt will completed sit to stand with ModA + LRAD PT Short Term Goal 2 (Week 1): Pt will completed SPT with ModA + LRAD PT Short Term Goal 3 (Week 1): Pt will maintain dynamic standing with ModA + LRAD for 5 mins PT Short Term Goal 4 (Week 1): Pt will initiate ambulation activities  Skilled Therapeutic Interventions/Progress Updates:    Pt received sitting in recliner and agreeable to therapy. Pt denied any pain at rest in sitting. Student -PT donned shoes in sitting, min assist. Pt requested to walk to therapy gym and do as much walking as possible.    Sit to stand transfers were CGA. Pt was adamant about doing as much of the transfer independently. Demonstrated better ease of movement when L hand was on the armchair and R on the RW.   Gait training: walked to the main therapy gym and back to the room w/ CGA roughly 110fx2 and roughly 55 ft in the therapy gym. Pt reported a 4/10 pain in RLE during ambulation back to her room. and was demonstrated by reduced stance time on RLE, however, pt was determined to walk back to her room.  Stair training done on shallower steps w/ CGA/ Pt completed 1 rep up and down. Fatigue was exhibited on the way down w/ the pt having difficulty lifting R foot off step and down to floor. Pt expressed worry of the STE at her home. The stair is taller and there are no rails. Practiced the higher step in parallel bars on 4' step, w/ CGA Pt only felt comfortable to do so if she could use both railings. 5 reps were done stepping up and stepping backwards down. Pt expressed joy that she was able to do the step.   Pt idependently ambulated back to room w/ CGA and was left in WC by the bed, call bell w/ in reach, and all needs met. Pt called nurse  and requested tylenol.   Therapy Documentation Precautions:  Precautions Precautions: Fall Restrictions Weight Bearing Restrictions: Yes RLE Weight Bearing: Weight bearing as tolerated General:   Pain:4/10 pain when ambulating, pt expressed that she was still able and wanted to walk     Therapy/Group: Individual Therapy  Mckenize Mezera 12/07/2022, 12:18 PM

## 2022-12-08 NOTE — Progress Notes (Signed)
Occupational Therapy Session Note  Patient Details  Name: Terri Mayer MRN: [id number] Date of Birth: March 14, 1951  Today's Date: 12/08/2022 OT Individual Time: 4098-1191 OT Individual Time Calculation (min): 40 min    Short Term Goals: Week 2:  OT Short Term Goal 1 (Week 2): STG=LTG's d/t LOS  Skilled Therapeutic Interventions/Progress Updates:  Pt received seated in [facility] for skilled OT session with focus on functional transfers and mobility. Pt agreeable to interventions, demonstrating overall pleasant mood. Pt with un-rated pain, sharing multiple functional progressions since last session with this therapist. OT offering intermediate rest breaks and positioning suggestions throughout session to address potential pain/fatigue and maximize participation/safety in session.   Pt and OT discuss advancements in home renovations, pt stating grab bars in bathroom as well as additional AE to be installed/delivered by DC date, if not earlier.   Pt dependent for WC transport from room>simulated apartment for time management. In simulated apartment, pt performs tub transfer with overall supervision, use of TTB/grab bars, and increased time for pain management. Pt ambulates into/out of bathroom with RW + supervision.   Pt walks more than household-level distance from simulated apartment>room with supervision + RW, requiring 1 standing rest break. Along the way, pt and OT discuss readiness for discharge and OPOT vs. [place], with patient preferencing HHOT initially.   Pt performs bed mobility with overall supervision and A for propping of LLE. Pt and OT discuss the need for/benefit of sock aid for compression/TED socks, as patient might not need TEDs past DC and/sister could assist with donning until no longer needed.   Pt remained resting in bed with all immediate needs met at end of session. Pt continues to be appropriate for skilled OT intervention to promote further functional independence.    Therapy Documentation Precautions:  Precautions Precautions: Fall Restrictions Weight Bearing Restrictions: Yes RLE Weight Bearing: Weight bearing as tolerated  Therapy/Group: Individual Therapy  Maudie Mercury, OTR/L, MSOT  12/08/2022, 7:51 AM

## 2022-12-08 NOTE — Progress Notes (Signed)
PROGRESS NOTE   Subjective/Complaints:  Doing well pretty well today. Had a bit more swelling in her LEs last night from being upright in the chair more yesterday; improved with elevation. Slept ok, pain is doing pretty well, and she had a BM yesterday and this morning. She's excited with how well she's doing and is anticipating her discharge date.    ROS: +constipation. Pt denies CP, SOB, abd pain, N/V/D, new numbness/tingling/weakness, or vision changes    Objective:   No results found. No results for input(s): "WBC", "HGB", "HCT", "PLT" in the last 72 hours.  No results for input(s): "NA", "K", "CL", "CO2", "GLUCOSE", "BUN", "CREATININE", "CALCIUM" in the last 72 hours.   Intake/Output Summary (Last 24 hours) at 12/08/2022 0725 Last data filed at 12/08/2022 0552 Gross per 24 hour  Intake 936 ml  Output 1350 ml  Net -414 ml         Physical Exam: Vital Signs Blood pressure (!) 141/74, pulse 71, temperature 97.9 F (36.6 C), temperature source Oral, resp. rate 16, height '5\' 6"'$  (1.676 m), weight 113.6 kg, SpO2 95 %.   General: awake, alert, appropriate, in chair; NT in room;  NAD HENT: conjugate gaze; oropharynx moist CV: regular rate and rhythm; no JVD Pulmonary: CTA B/L; no W/R/R- good air movement GI: soft, NT, ND, (+)BS- normoactive Psychiatric: appropriate- interactive Neurological: Ox3  Skin:  RLE with 1-2+ pitting edema up to mid calf, no significant warmth, no erythema. LLE without any significant edema noted. Neg Homan's bilaterally.  RLE incision have sutures- not rechecked today, no drainage or erythema surrounding it, Appropriately tender to palpation- today C/D/I covered with bandage, not removed during exam  MSK:      Mild swelling around R thigh with some dark bruising discoloration--improving      Moderate hip pain with attempted ROM of both the L and R thighs      Strength:                RUE:  5/5 SA, 5/5 EF, 5/5 EE, 5/5 WE, 5/5 FF, 5/5 FA                 LUE: 5/5 SA, 5/5 EF, 5/5 EE, 5/5 WE, 5/5 FF, 5/5 FA                 RLE: 2-/5 HF, 2+/5 KE, 5/5 DF, 5/5 EHL, 5/5 PF                 LLE:  2+/5 HF, 3+/5 KE, 5/5 DF, 5/5 EHL, 5/5 PF    Neurologic exam: Sitting up in bed.  Makes eye contact with examiner.  Follows full commands. Cognition: AAO to person, place, time and event.  Language: Fluent, No substitutions or neoglisms. No dysarthria. Names 3/3 objects correctly.  Memory: Recalls 3/3 objects at 5 minutes. No apparent deficits  Insight: Good insight into current condition.  Mood: Pleasant affect, appropriate mood.  Sensation: To light touch reduced in bilateral feet and ankles Reflexes: 2+ in BL UE and LEs. Negative Hoffman's and babinski signs bilaterally.  CN: 2-12 grossly intact.  Coordination: No apparent tremors.  Spasticity: MAS 0 in all  extremities.       Assessment/Plan: 1. Functional deficits which require 3+ hours per day of interdisciplinary therapy in a comprehensive inpatient rehab setting. Physiatrist is providing close team supervision and 24 hour management of active medical problems listed below. Physiatrist and rehab team continue to assess barriers to discharge/monitor patient progress toward functional and medical goals  Care Tool:  Bathing    Body parts bathed by patient: Right arm, Left arm, Chest, Abdomen, Front perineal area, Right upper leg, Left upper leg, Face, Right lower leg, Left lower leg   Body parts bathed by helper: Buttocks     Bathing assist Assist Level: Minimal Assistance - Patient > 75%     Upper Body Dressing/Undressing Upper body dressing   What is the patient wearing?: Pull over shirt    Upper body assist Assist Level: Independent    Lower Body Dressing/Undressing Lower body dressing      What is the patient wearing?: Underwear/pull up, Pants     Lower body assist Assist for lower body dressing: Contact  Guard/Touching assist     Toileting Toileting    Toileting assist Assist for toileting: Minimal Assistance - Patient > 75%     Transfers Chair/bed transfer  Transfers assist     Chair/bed transfer assist level: Maximal Assistance - Patient 25 - 49%     Locomotion Ambulation   Ambulation assist   Ambulation activity did not occur: Safety/medical concerns (limited by pain/activity tolerance)          Walk 10 feet activity   Assist  Walk 10 feet activity did not occur: Safety/medical concerns (limited by pain/activity tolerance)        Walk 50 feet activity   Assist Walk 50 feet with 2 turns activity did not occur: Safety/medical concerns (limited by pain/activity tolerance)         Walk 150 feet activity   Assist Walk 150 feet activity did not occur: Safety/medical concerns (limited by pain/activity tolerance)         Walk 10 feet on uneven surface  activity   Assist Walk 10 feet on uneven surfaces activity did not occur: Safety/medical concerns (limited by pain/activity tolerance)         Wheelchair     Assist Is the patient using a wheelchair?: Yes Type of Wheelchair: Manual Wheelchair activity did not occur: Safety/medical concerns (fatigue/endurance/pain)         Wheelchair 50 feet with 2 turns activity    Assist    Wheelchair 50 feet with 2 turns activity did not occur: Safety/medical concerns       Wheelchair 150 feet activity     Assist  Wheelchair 150 feet activity did not occur: Safety/medical concerns       Blood pressure (!) 141/74, pulse 71, temperature 97.9 F (36.6 C), temperature source Oral, resp. rate 16, height '5\' 6"'$  (1.676 m), weight 113.6 kg, SpO2 95 %.  Medical Problem List and Plan: 1. Functional deficits secondary to right comminuted subtrochanteric proximal femur fracture.  Status post intramedullary nailing of right hip 11/26/2022 (Dr. Marcelino Scot).  Weightbearing as tolerated with walker,  unrestricted ROM R hip/knee, f/up outpatient with ortho in osteoporosis clinic             -patient may shower             -ELOS/Goals: 10-14 days, SPV PT/OT  -D/c date 12/13/22 -Con't CIR- PT and OT_ pt says now thinks will get out of here appropriately by 1/26- said  is walking some and tried stairs with PT.  2.  Antithrombotics: -DVT/anticoagulation:  Pharmaceutical: Eliquis 2.'5mg'$  BID - ppx for 30 days post-op (11/26/22-12/26/22).   -12/01/22 Dopplers negative for DVT -antiplatelet therapy: N/A 3. Pain Management: Tylenol 325-'650mg'$  q6h PRN, Oxycodone 5-'10mg'$  q4h PRN and Robaxin '500mg'$  q6h PRN -Chronic chemotherapy induced peripheral neuropathy - stable, no additional medications req -1/17- pain tolerable, but wanted to switch Oxy to Norco- we discussed would have to reduce amount of tylenol she gets since has tylenol in it- will wait til tomorrow to reassess -1/18- pain worse this AM, but due for meds- will see how it goes -1/19- pt doesn't want to decrease Tylenol until next Mon/Tuesday- I explained that's fine, but will monitor CMP closely.  -12/07/22 pain controlled, continue to monitor; ordered weekly labs (CMP/CBC) starting 12/09/22 4. Mood/Behavior/Sleep: Provide emotional support -antipsychotic agents: N/A 5. Neuropsych/cognition: This patient is capable of making decisions on her own behalf. 6. Skin/Wound Care: Routine skin checks -11/30/22 Spoke with Ainsley Spinner PA-C for OTS, states can have daily dressing changes per nursing staff here, ok to shower and clean wounds with soap and water--ordered -12/01/22 made nursing aware of dressing change order daily   -12/07/22 continue daily dressing, reminded nursing of DAILY changes 7. Fluids/Electrolytes/Nutrition: Routine in and outs with follow-up chemistries (weekly labs next 12/09/22)  -Continue Vitamin C '1000mg'$  QD and vitamin D 2000IU BID -11/30/22 Monitor SCr, last value 1.06 on 11/28/22, encourage fluids and monitor -12/02/22 Cr 0.8 and Bun 24  stable, continue to monitor on weekly labs (12/09/22)  8.  Acute blood loss anemia.  Hgb 10.6 on 11/28/22. Follow-up CBC on weekly labs starting 12/02/22 -1/15 HGB up to 12.0  -1/19- labs weekly, ordered for 12/09/22 9.  Morbid obesity.  BMI 38.94.  Dietary follow-up 10.  History of right breast cancer.  Follow-up outpatient. 11.  Constipation:  Colace BID, MiraLAX 17g QD PRN  -Per patient, LBM prior to admission during incident -Increase regimen to Sennakot-S 1 tab BID + miralax 17g QD, PRN MoM -11/30/22 doesn't feel constipated, but no BM since date of incident (11/24/22); added back Miralax 17g PRN in addition to QD dosing, added Fleet enema QD PRN and Sorbitol 70% 30-51m QD PRN; monitor -12/01/22 BM today! Continue current regimen.  -1/15 will change senokot to daily   -1/16- LBM yesterday -1/17- LBM 2 days ago- will d/c daily miralax since makes her bloated and full and increase Senna to 3 tabs daily.   -1/18- LBM yesterday- feeling better -1/19- extremely large BM last night- pt asked to move Senna back down to 2 tabs- will do so.  -12/07/22 LBM 12/05/22 but doesn't feel constipated, wants to stick with current regimen for now (Senokot 2 tabs QD, MoM 187mQD PRN, Miralax 17g QD PRN, Sorbitol 70% 30-6098mD PRN) -12/08/22 BM yesterday and today, continue current regimen 12. LE edema: noted 12/07/22 PM by patient but was upright in chair for a lot of the day, eval today 12/08/22 shows 1-2+ RLE edema up to mid calf, no significant tenderness or warmth, doubt DVT, suspect purely dependent edema.  -Monitor for changes, encouraged elevation of extremity, compression socks; if worsening, could consider mild diuretic but doubt this will be necessary; repeat dopplers if s/sx of DVT occur, but doubtful.    LOS: 9 days A FACE TO [id number]/[id number], 7:25 AM

## 2022-12-08 NOTE — Progress Notes (Signed)
Physical Therapy Weekly Progress Note  Patient Details  Name: Terri Mayer MRN: [id number] Date of Birth: 1951-05-08  Beginning of progress report period: November 30, 2022 End of progress report period: December 08, 2022  Patient has met 4 of 4 short term goals.  Patient is making excellent progress and long term goals revised due to improvements in mobility. Pt requires (S) for bed mobility, transfers, gait ~180 ft with RW. Pt has 1 step to enter home without rails and requires CGA with RW for curb step negotiation. Plan to continue patient and family education.   Patient continues to demonstrate the following deficits muscle weakness and muscle joint tightness and therefore will continue to benefit from skilled PT intervention to increase functional independence with mobility.  Patient progressing toward long term goals..  Plan of care revisions: Long term goals upgraded to Mod I and (S) for curb step.  PT Short Term Goals Week 1:  PT Short Term Goal 1 (Week 1): Pt will completed sit to stand with ModA + LRAD PT Short Term Goal 1 - Progress (Week 1): Met PT Short Term Goal 2 (Week 1): Pt will completed SPT with ModA + LRAD PT Short Term Goal 2 - Progress (Week 1): Met PT Short Term Goal 3 (Week 1): Pt will maintain dynamic standing with ModA + LRAD for 5 mins PT Short Term Goal 3 - Progress (Week 1): Met PT Short Term Goal 4 (Week 1): Pt will initiate ambulation activities PT Short Term Goal 4 - Progress (Week 1): Met Week 2:  PT Short Term Goal 1 (Week 2): STG=LTG due to ELOS  Skilled Therapeutic Interventions/Progress Updates:      Therapy Documentation Precautions:  Precautions Precautions: Fall Restrictions Weight Bearing Restrictions: Yes RLE Weight Bearing: Weight bearing as tolerated     Therapy/Group: Individual Therapy  Verl Dicker Verl Dicker PT, DPT  12/08/2022, 12:14 PM

## 2022-12-08 NOTE — Progress Notes (Signed)
Physical Therapy Session Note  Patient Details  Name: Terri Mayer MRN: [id number] Date of Birth: 06/16/1951  Today's Date: 12/08/2022 PT Individual Time: 1015-1100 PT Individual Time Calculation (min): 45 min   Short Term Goals: Week 1:  PT Short Term Goal 1 (Week 1): Pt will completed sit to stand with ModA + LRAD PT Short Term Goal 2 (Week 1): Pt will completed SPT with ModA + LRAD PT Short Term Goal 3 (Week 1): Pt will maintain dynamic standing with ModA + LRAD for 5 mins PT Short Term Goal 4 (Week 1): Pt will initiate ambulation activities  Skilled Therapeutic Interventions/Progress Updates:      Therapy Documentation Precautions:  Precautions Precautions: Fall Restrictions Weight Bearing Restrictions: Yes RLE Weight Bearing: Weight bearing as tolerated  Pt agreeable to PT session with emphasis on gait and stair training.   On PT arrival, pt reports bilateral LE edema. Pt R LE warm to touch and increased swelling compared to left LE. PA notiifed and cleared pt to particpate in PT session. PT dependently donned thigh high ted hose for edema management.   Pt (S) for bed mobility with use of bed features, STS and gait ~180 ft to main gym. Pt with decreased R LE stance time and increased UE reliance on RW. PT provided verbal cues for shoulder depression and posture and pt demonstrated improved gait.   Pt performed curb step x 2 with RW and CGA with visual and verbal cues for sequencing.   Pt ambulated to room with (S) and left semi-reclined in bed with all needs in reach and alarm on.   Pt with R LE pain 1-5/10, provided rest breaks and repositioning for relief.   Therapy/Group: Individual Therapy  Verl Dicker Verl Dicker PT, DPT  12/08/2022, 7:58 AM

## 2022-12-08 NOTE — Plan of Care (Signed)
  Problem: Consults Goal: RH GENERAL PATIENT EDUCATION Description: See Patient Education module for education specifics. Outcome: Progressing   Problem: RH BOWEL ELIMINATION Goal: RH STG MANAGE BOWEL WITH ASSISTANCE Description: STG Manage Bowel with min Assistance. Outcome: Progressing Goal: RH STG MANAGE BOWEL W/MEDICATION W/ASSISTANCE Description: STG Manage Bowel with Medication with min Assistance. Outcome: Progressing   Problem: RH SKIN INTEGRITY Goal: RH STG SKIN FREE OF INFECTION/BREAKDOWN Description: Skin will be free of any infection/breakdown with min assist Outcome: Progressing Goal: RH STG ABLE TO PERFORM INCISION/WOUND CARE W/ASSISTANCE Description: STG Able To Perform Incision/Wound Care With min Assistance. Outcome: Progressing   Problem: RH SAFETY Goal: RH STG ADHERE TO SAFETY PRECAUTIONS W/ASSISTANCE/DEVICE Description: STG Adhere to Safety Precautions With min Assistance/Device. Outcome: Progressing   Problem: RH PAIN MANAGEMENT Goal: RH STG PAIN MANAGED AT OR BELOW PT'S PAIN GOAL Description: Pain will be managed at 4 out of 10 on pain scale with PRN medications min assist Outcome: Progressing   Problem: RH KNOWLEDGE DEFICIT GENERAL Goal: RH STG INCREASE KNOWLEDGE OF SELF CARE AFTER HOSPITALIZATION Description: Patient/caregiver will be able to manage medications and self care from nursing education and nursing handouts independently  Outcome: Progressing   Problem: Education: Goal: Verbalization of understanding the information provided (i.e., activity precautions, restrictions, etc) will improve Outcome: Progressing Goal: Individualized Educational Video(s) Outcome: Progressing   Problem: Activity: Goal: Ability to ambulate and perform ADLs will improve Outcome: Progressing   Problem: Clinical Measurements: Goal: Postoperative complications will be avoided or minimized Outcome: Progressing   Problem: Self-Concept: Goal: Ability to maintain  and perform role responsibilities to the fullest extent possible will improve Outcome: Progressing   Problem: Pain Management: Goal: Pain level will decrease Outcome: Progressing

## 2022-12-08 NOTE — Discharge Summary (Signed)
Physician Discharge Summary  Patient ID: Terri Mayer MRN: [id number] DOB/AGE: [birth year] 72 y.o.  Admit date: 11/29/2022 Discharge date: 12/13/2022  Discharge Diagnoses:  Principal Problem:   Intertrochanteric fracture of right hip ([facility]) Active Problems:   Slow transit constipation DVT prophylaxis Pain management Acute blood loss anemia Morbid obesity History of right breast cancer Constipation  Discharged Condition: Stable  Significant Diagnostic Studies: VAS [place] LOWER EXTREMITY VENOUS (DVT)  Result Date: 11/30/2022  Lower Venous DVT Study Patient Name:  Terri Mayer  Date of Exam:   11/30/2022 Medical Rec #: [id number]           Accession #:    [id number] Date of Birth: 10-25-1951           Patient Gender: F Patient Age:   72 years Exam Location:  [facility] Procedure:      VAS [place] LOWER EXTREMITY VENOUS (DVT) Referring Phys: Lauraine Rinne --------------------------------------------------------------------------------  Indications: Rehab patient - "swelling".  Anticoagulation: Eliquis - post sx. Limitations: Poor ultrasound/tissue interface. Comparison Study: Previous exam on 11/26/22 negative for DVT Performing Technologist: Jody Hill RVT, RDMS  Examination Guidelines: A complete evaluation includes B-mode imaging, spectral Doppler, color Doppler, and power Doppler as needed of all accessible portions of each vessel. Bilateral testing is considered an integral part of a complete examination. Limited examinations for reoccurring indications may be performed as noted. The reflux portion of the exam is performed with the patient in reverse Trendelenburg.  +---------+---------------+---------+-----------+----------+-------------------+ RIGHT    CompressibilityPhasicitySpontaneityPropertiesThrombus Aging      +---------+---------------+---------+-----------+----------+-------------------+ CFV      Full           Yes      Yes                                       +---------+---------------+---------+-----------+----------+-------------------+ SFJ      Full                                                             +---------+---------------+---------+-----------+----------+-------------------+ FV Prox  Full           Yes      Yes                                      +---------+---------------+---------+-----------+----------+-------------------+ FV Mid   Full           Yes      Yes                                      +---------+---------------+---------+-----------+----------+-------------------+ FV DistalFull           Yes      Yes                                      +---------+---------------+---------+-----------+----------+-------------------+ PFV      Full                                                             +---------+---------------+---------+-----------+----------+-------------------+  POP      Full           Yes      Yes                                      +---------+---------------+---------+-----------+----------+-------------------+ PTV      Full                                                             +---------+---------------+---------+-----------+----------+-------------------+ PERO                                                  Not well visualized +---------+---------------+---------+-----------+----------+-------------------+   +---------+---------------+---------+-----------+----------+--------------+ LEFT     CompressibilityPhasicitySpontaneityPropertiesThrombus Aging +---------+---------------+---------+-----------+----------+--------------+ CFV      Full           Yes      Yes                                 +---------+---------------+---------+-----------+----------+--------------+ SFJ      Full                                                        +---------+---------------+---------+-----------+----------+--------------+ FV Prox  Full           Yes       Yes                                 +---------+---------------+---------+-----------+----------+--------------+ FV Mid   Full           Yes      Yes                                 +---------+---------------+---------+-----------+----------+--------------+ FV DistalFull           Yes      Yes                                 +---------+---------------+---------+-----------+----------+--------------+ PFV      Full                                                        +---------+---------------+---------+-----------+----------+--------------+ POP      Full                                                        +---------+---------------+---------+-----------+----------+--------------+  PTV      Full                                                        +---------+---------------+---------+-----------+----------+--------------+ PERO                                                  Acute          +---------+---------------+---------+-----------+----------+--------------+     Summary: BILATERAL: - No evidence of deep vein thrombosis seen in the lower extremities, bilaterally. -No evidence of popliteal cyst, bilaterally.   *See table(s) above for measurements and observations. Electronically signed by Harold Barban MD on 11/30/2022 at 6:48:04 PM.    Final    DG FEMUR PORT, MIN 2 VIEWS RIGHT  Result Date: 11/26/2022 CLINICAL DATA:  Intramedullary nail EXAM: RIGHT FEMUR PORTABLE 2 VIEW COMPARISON:  11/26/2022, 11/25/2022 FINDINGS: Interval intramedullary rodding of comminuted right intertrochanteric fracture with mild residual fracture displacement. Fixating screw at the distal femur. Gas in the soft tissues consistent with recent surgery. IMPRESSION: Interval intramedullary rodding of comminuted right intertrochanteric fracture with expected postsurgical changes from Electronically Signed   By: Donavan Foil M.D.   On: 11/26/2022 21:30   DG FEMUR, MIN 2 VIEWS  RIGHT  Result Date: 11/26/2022 CLINICAL DATA:  Fluoro guidance provided EXAM: RIGHT FEMUR 2 VIEWS; DG C-ARM 1-60 MIN-NO REPORT FINDINGS: Dose: 73.9 mGy Fluoro time 125s IMPRESSION: C-arm fluoro guidance provided. Electronically Signed   By: Sammie Bench M.D.   On: 11/26/2022 19:32   DG C-Arm 1-60 Min-No Report  Result Date: 11/26/2022 Fluoroscopy was utilized by the requesting physician.  No radiographic interpretation.   DG C-Arm 1-60 Min-No Report  Result Date: 11/26/2022 Fluoroscopy was utilized by the requesting physician.  No radiographic interpretation.   VAS [place] LOWER EXTREMITY VENOUS (DVT)  Result Date: 11/26/2022  Lower Venous DVT Study Patient Name:  Terri Mayer  Date of Exam:   11/26/2022 Medical Rec #: [id number]           Accession #:    [id number] Date of Birth: [birth year]           Patient Gender: F Patient Age:   33 years Exam Location:  [facility] Procedure:      VAS [place] LOWER EXTREMITY VENOUS (DVT) Referring Phys: Myles Rosenthal --------------------------------------------------------------------------------  Indications: Bilateral leg pain s/p fall and right IM nail.  Risk Factors: Cancer history - breast. Comparison Study: No prior studies. Performing Technologist: Darlin Coco RDMS, RVT  Examination Guidelines: A complete evaluation includes B-mode imaging, spectral Doppler, color Doppler, and power Doppler as needed of all accessible portions of each vessel. Bilateral testing is considered an integral part of a complete examination. Limited examinations for reoccurring indications may be performed as noted. The reflux portion of the exam is performed with the patient in reverse Trendelenburg.  +---------+---------------+---------+-----------+----------+--------------+ RIGHT    CompressibilityPhasicitySpontaneityPropertiesThrombus Aging +---------+---------------+---------+-----------+----------+--------------+ CFV      Full           Yes      Yes                                  +---------+---------------+---------+-----------+----------+--------------+  SFJ      Full                                                        +---------+---------------+---------+-----------+----------+--------------+ FV Prox  Full                                                        +---------+---------------+---------+-----------+----------+--------------+ FV Mid   Full                                                        +---------+---------------+---------+-----------+----------+--------------+ FV DistalFull                                                        +---------+---------------+---------+-----------+----------+--------------+ PFV      Full                                                        +---------+---------------+---------+-----------+----------+--------------+ POP      Full           Yes      Yes                                 +---------+---------------+---------+-----------+----------+--------------+ PTV      Full                                                        +---------+---------------+---------+-----------+----------+--------------+ PERO     Full                                                        +---------+---------------+---------+-----------+----------+--------------+   +---------+---------------+---------+-----------+----------+--------------+ LEFT     CompressibilityPhasicitySpontaneityPropertiesThrombus Aging +---------+---------------+---------+-----------+----------+--------------+ CFV      Full           Yes      Yes                                 +---------+---------------+---------+-----------+----------+--------------+ SFJ      Full                                                        +---------+---------------+---------+-----------+----------+--------------+  FV Prox  Full                                                         +---------+---------------+---------+-----------+----------+--------------+ FV Mid   Full                                                        +---------+---------------+---------+-----------+----------+--------------+ FV DistalFull                                                        +---------+---------------+---------+-----------+----------+--------------+ PFV      Full                                                        +---------+---------------+---------+-----------+----------+--------------+ POP      Full           Yes      Yes                                 +---------+---------------+---------+-----------+----------+--------------+ PTV      Full                                                        +---------+---------------+---------+-----------+----------+--------------+ PERO     Full                                                        +---------+---------------+---------+-----------+----------+--------------+     Summary: RIGHT: - There is no evidence of deep vein thrombosis in the lower extremity.  - No cystic structure found in the popliteal fossa.  LEFT: - There is no evidence of deep vein thrombosis in the lower extremity.  - No cystic structure found in the popliteal fossa.  *See table(s) above for measurements and observations. Electronically signed by Servando Snare MD on 11/26/2022 at 3:06:12 PM.    Final    CT HEAD WO CONTRAST  Result Date: 11/25/2022 CLINICAL DATA:  Head trauma, moderate-severe; Polytrauma, blunt EXAM: CT HEAD WITHOUT CONTRAST CT CERVICAL SPINE WITHOUT CONTRAST TECHNIQUE: Multidetector CT imaging of the head and cervical spine was performed following the standard protocol without intravenous contrast. Multiplanar CT image reconstructions of the cervical spine were also generated. RADIATION DOSE REDUCTION: This exam was performed according to the departmental dose-optimization program which includes automated exposure control,  adjustment of the mA and/or kV according to patient size and/or use of iterative reconstruction technique. COMPARISON:  None Available.  FINDINGS: CT HEAD FINDINGS Brain: No evidence of large-territorial acute infarction. No parenchymal hemorrhage. No mass lesion. No extra-axial collection. Empty sella. No mass effect or midline shift. No hydrocephalus. Basilar cisterns are patent. Vascular: No hyperdense vessel. Skull: No acute fracture or focal lesion. Sinuses/Orbits: Right maxillary sinus polypoid like mucosal thickening. Otherwise paranasal sinuses and mastoid air cells are clear. The orbits are unremarkable. Other: None. CT CERVICAL SPINE FINDINGS Alignment: Normal. Skull base and vertebrae: Multilevel moderate degenerative changes of the spine. No associated severe osseous neural foraminal or central canal stenosis. No acute fracture. No aggressive appearing focal osseous lesion or focal pathologic process. Soft tissues and spinal canal: No prevertebral fluid or swelling. No visible canal hematoma. Upper chest: Unremarkable. Other: None. IMPRESSION: 1. No acute intracranial abnormality. 2. No acute displaced fracture or traumatic listhesis of the cervical spine. 3. Empty sella. Findings is often a normal anatomic variant but can be associated with idiopathic intracranial hypertension (pseudotumor cerebri). Electronically Signed   By: Iven Finn M.D.   On: 11/25/2022 19:21   CT CERVICAL SPINE WO CONTRAST  Result Date: 11/25/2022 CLINICAL DATA:  Head trauma, moderate-severe; Polytrauma, blunt EXAM: CT HEAD WITHOUT CONTRAST CT CERVICAL SPINE WITHOUT CONTRAST TECHNIQUE: Multidetector CT imaging of the head and cervical spine was performed following the standard protocol without intravenous contrast. Multiplanar CT image reconstructions of the cervical spine were also generated. RADIATION DOSE REDUCTION: This exam was performed according to the departmental dose-optimization program which includes automated  exposure control, adjustment of the mA and/or kV according to patient size and/or use of iterative reconstruction technique. COMPARISON:  None Available. FINDINGS: CT HEAD FINDINGS Brain: No evidence of large-territorial acute infarction. No parenchymal hemorrhage. No mass lesion. No extra-axial collection. Empty sella. No mass effect or midline shift. No hydrocephalus. Basilar cisterns are patent. Vascular: No hyperdense vessel. Skull: No acute fracture or focal lesion. Sinuses/Orbits: Right maxillary sinus polypoid like mucosal thickening. Otherwise paranasal sinuses and mastoid air cells are clear. The orbits are unremarkable. Other: None. CT CERVICAL SPINE FINDINGS Alignment: Normal. Skull base and vertebrae: Multilevel moderate degenerative changes of the spine. No associated severe osseous neural foraminal or central canal stenosis. No acute fracture. No aggressive appearing focal osseous lesion or focal pathologic process. Soft tissues and spinal canal: No prevertebral fluid or swelling. No visible canal hematoma. Upper chest: Unremarkable. Other: None. IMPRESSION: 1. No acute intracranial abnormality. 2. No acute displaced fracture or traumatic listhesis of the cervical spine. 3. Empty sella. Findings is often a normal anatomic variant but can be associated with idiopathic intracranial hypertension (pseudotumor cerebri). Electronically Signed   By: Iven Finn M.D.   On: 11/25/2022 19:21   CT PELVIS WO CONTRAST  Result Date: 11/25/2022 CLINICAL DATA:  Pelvic fracture.  Fall. EXAM: CT PELVIS WITHOUT CONTRAST TECHNIQUE: Multidetector CT imaging of the pelvis was performed following the standard protocol without intravenous contrast. RADIATION DOSE REDUCTION: This exam was performed according to the departmental dose-optimization program which includes automated exposure control, adjustment of the mA and/or kV according to patient size and/or use of iterative reconstruction technique. COMPARISON:  Pelvis  x-ray same day. FINDINGS: Urinary Tract:  No abnormality visualized. Bowel: No acute abnormality. There is sigmoid colon diverticulosis. Vascular/Lymphatic: There are atherosclerotic calcifications of the aorta. No enlarged lymph nodes are seen. Reproductive:  Ovaries and uterus are within normal limits. Other: No pelvic free fluid. Small fat containing umbilical hernia. Musculoskeletal: There is an acute comminuted right femoral intratrochanteric fracture. There is 2 cm of medial  displacement of the distal fracture fragment. There is no dislocation. Joint spaces are well maintained. Degenerative changes affect the sacroiliac joints and lower lumbar facet joints. IMPRESSION: 1. Acute comminuted right femoral intratrochanteric fracture. Aortic Atherosclerosis (ICD10-I70.0). Electronically Signed   By: Ronney Asters M.D.   On: 11/25/2022 18:52   DG FEMUR PORT 1V LEFT  Result Date: 11/25/2022 CLINICAL DATA:  Fall EXAM: LEFT FEMUR PORTABLE 1 VIEW COMPARISON:  None Available. FINDINGS: Femoral head projects in joint. No fracture or malalignment. Moderate degenerative changes at the knee IMPRESSION: No acute osseous abnormality Electronically Signed   By: Donavan Foil M.D.   On: 11/25/2022 18:38   DG FEMUR PORT, 1V RIGHT  Result Date: 11/25/2022 CLINICAL DATA:  Fall EXAM: RIGHT FEMUR PORTABLE 1 VIEW COMPARISON:  None Available. FINDINGS: Acute comminuted and displaced right intertrochanteric fracture. Mid to distal femur appears intact. No femoral head dislocation IMPRESSION: Acute comminuted and displaced right intertrochanteric fracture. Electronically Signed   By: Donavan Foil M.D.   On: 11/25/2022 18:38   DG Pelvis Portable  Result Date: 11/25/2022 CLINICAL DATA:  Trauma EXAM: PORTABLE PELVIS 1-2 VIEWS COMPARISON:  None Available. FINDINGS: Limited by habitus. SI joints are non widened. Pubic symphysis and rami appear intact. Acute comminuted and displaced right intertrochanteric fracture. No femoral head  dislocation IMPRESSION: Acute comminuted and displaced right intertrochanteric fracture. Electronically Signed   By: Donavan Foil M.D.   On: 11/25/2022 18:37   DG Chest Port 1 View  Result Date: 11/25/2022 CLINICAL DATA:  Trauma EXAM: PORTABLE CHEST 1 VIEW COMPARISON:  Chest x-ray 03/19/2016 FINDINGS: Cardiomediastinal silhouette is within normal limits for patient positioning. Is no focal lung infiltrate, pleural effusion or pneumothorax on this supine view. Surgical clips are seen in the right axilla. No acute fractures are identified. IMPRESSION: 1. No acute cardiopulmonary process. 2. No acute fractures are identified. Electronically Signed   By: Ronney Asters M.D.   On: 11/25/2022 18:37    Labs:  Basic Metabolic Panel: Recent Labs  Lab 12/09/22 0542  NA 140  K 4.3  CL 105  CO2 27  GLUCOSE 97  BUN 20  CREATININE 0.72  CALCIUM 9.4    CBC: Recent Labs  Lab 12/09/22 0542  WBC 7.5  NEUTROABS 4.8  HGB 12.0  HCT 36.7  MCV 83.6  PLT 398    CBG: No results for input(s): "GLUCAP" in the last 168 hours.  Family history.  Father with CAD.  Brother with Charcot-Marie-Tooth disease.  Negative for colon cancer or breast cancer  Brief HPI:   Terri Mayer is a 72 y.o. 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  Per chart review lives alone.  Retired Pharmacist, community.  She has a sister and son who provide assistance.  Presented 11/25/2022 after she reportedly tripped and fell while putting away Christmas decorations.  She denied loss of consciousness.  Noted immediate pain to the right leg.  Patient was down an extended time until found by neighbor.  Cranial CT scan as well as CT cervical spine negative.  X-rays and imaging showed right comminuted subtrochanteric proximal femur fracture.  Admission chemistries unremarkable except WBC  at 23,200 lactic acid 3.4-2.4, CK 554, D-dimer 1.62.  Patient was cleared for surgery underwent intramedullary nailing of right hip 11/26/2022 per Dr. Janalyn Rouse.  Weightbearing as tolerated.  Placed on Eliquis for DVT prophylaxis.  Hospital course acute blood loss anemia 10.6 and monitored.  Follow-up CK improved to 440 as well as WBC of 11,000.  Therapy evaluations completed due to patient decreased functional mobility was admitted for a comprehensive rehab program.   Hospital Course: Terri Mayer was admitted to rehab 11/29/2022 for inpatient therapies to consist of PT, ST and OT at least three hours five days a week. Past admission physiatrist, therapy team and rehab RN have worked together to provide customized collaborative inpatient rehab.  Pertaining to patient's right comminuted subtrochanteric proximal femur fracture.  Status post intramedullary nailing of right hip 11/26/2022.  Weightbearing as tolerated.  Neurovascular sensation intact.  Patient remained on Eliquis for DVT prophylaxis through 12/26/2022 venous Doppler studies were negative.  Pain management with the use of oxycodone as well as Robaxin.  Acute blood loss anemia stable latest hemoglobin 12.0 no bleeding episodes.  Morbid obesity BMI 38.94 dietary follow-up.  History of breast cancer follow-up outpatient.  Bouts of constipation resolved with laxative assistance.   Blood pressures were monitored on TID basis and controlled    Rehab course: During patient's stay in rehab weekly team conferences were held to monitor patient's progress, set goals and discuss barriers to discharge. At admission, patient required moderate assist ambulate 5 feet rolling walker moderate assist sit to stand  Physical exam.  Blood pressure 133/78 pulse 100 temperature 98.1 respirations 18 oxygen saturation is 95% room air Constitutional.  No acute distress HEENT Head.  Normocephalic and atraumatic Eyes.  Pupils round and reactive to light no  discharge without nystagmus Neck.  Supple nontender no JVD without thyromegaly Cardiac regular rate and rhythm without any extra sounds or murmur heard Abdomen.  Soft nontender positive bowel sounds without rebound Respiratory effort normal no respiratory distress without wheeze Skin.  Surgical site clean and dry Musculoskeletal Right upper extremity 5/5 Left upper extremity 5/5 Right lower extremity 2 -/5 hip flexors 2+/5 knee extension 5/5 dorsiflexion 5/5 EHL, 5/5 PF Left lower extremity 2+/5 hip flexors 3+/5 knee extension 5/5 dorsiflexion 5/5 EHL, 5/5 PF Neurologic.  Alert and oriented x 3  He/She  has had improvement in activity tolerance, balance, postural control as well as ability to compensate for deficits. He/She has had improvement in functional use RUE/LUE  and RLE/LLE as well as improvement in awareness.  Patient making excellent progress requires supervision for bed mobility transfers ambulates 180 feet rolling walker.  Contact-guard for stairs and curbs.  Patient able to complete sit to stand using rolling walker ambulates to the tub with contact-guard.  Ambulates to the kitchen rolling walker contact-guard.  Gather his belongings for ADLs.  Full teaching completed plan discharge to home       Disposition: Discharge to home    Diet: Regular  Special Instructions: No driving smoking or alcohol  Medications at discharge 1.  Tylenol as needed 2.  Eliquis 2.5 mg p.o. twice daily through 12/26/2022 and stop 3.  Vitamin C 1000 mg p.o. daily 4.  Vitamin D 2000 units p.o. twice daily 5.  Robaxin 500 mg p.o. every 6 hours as needed muscle spasms 6.  Oxycodone 5 to 10 mg every 4 hours as needed pain 7.  Senokot S2 tabs p.o. daily hold for loose stools  30-35 minutes were spent completing discharge summary and discharge planning     Follow-up Information     Lovorn, Jinny Blossom, MD Follow up.   Specialty: Physical Medicine and Rehabilitation Why: No formal follow-up  needed Contact information: [address]  [address] [place] [address] [phone]         [place] , MD Follow up.   Specialty: Orthopedic Surgery Why: Call for appointment Contact information: [place] [address] [phone]                 Signed: Lavon Paganini Adelino 12/13/2022, 5:14 AM

## 2022-12-09 LAB — COMPREHENSIVE METABOLIC PANEL
ALT: 17 U/L (ref 0–44)
AST: 19 U/L (ref 15–41)
Albumin: 3.2 g/dL — ABNORMAL LOW (ref 3.5–5.0)
Alkaline Phosphatase: 61 U/L (ref 38–126)
Anion gap: 8 (ref 5–15)
BUN: 20 mg/dL (ref 8–23)
CO2: 27 mmol/L (ref 22–32)
Calcium: 9.4 mg/dL (ref 8.9–10.3)
Chloride: 105 mmol/L (ref 98–111)
Creatinine, Ser: 0.72 mg/dL (ref 0.44–1.00)
GFR, Estimated: >60 mL/min (ref >60)
Glucose, Bld: 97 mg/dL (ref 70–99)
Potassium: 4.3 mmol/L (ref 3.5–5.1)
Sodium: 140 mmol/L (ref 135–145)
Total Bilirubin: 0.6 mg/dL (ref 0.3–1.2)
Total Protein: 6.2 g/dL — ABNORMAL LOW (ref 6.5–8.1)

## 2022-12-09 LAB — CBC WITH DIFFERENTIAL/PLATELET
Abs Immature Granulocytes: 0.04 10*3/uL (ref 0.00–0.07)
Basophils Absolute: 0 10*3/uL (ref 0.0–0.1)
Basophils Relative: 1 %
Eosinophils Absolute: 0.3 10*3/uL (ref 0.0–0.5)
Eosinophils Relative: 4 %
HCT: 36.7 % (ref 36.0–46.0)
Hemoglobin: 12 g/dL (ref 12.0–15.0)
Immature Granulocytes: 1 %
Lymphocytes Relative: 20 %
Lymphs Abs: 1.5 10*3/uL (ref 0.7–4.0)
MCH: 27.3 pg (ref 26.0–34.0)
MCHC: 32.7 g/dL (ref 30.0–36.0)
MCV: 83.6 fL (ref 80.0–100.0)
Monocytes Absolute: 0.8 10*3/uL (ref 0.1–1.0)
Monocytes Relative: 10 %
Neutro Abs: 4.8 10*3/uL (ref 1.7–7.7)
Neutrophils Relative %: 64 %
Platelets: 398 10*3/uL (ref 150–400)
RBC: 4.39 MIL/uL (ref 3.87–5.11)
RDW: 13.9 % (ref 11.5–15.5)
WBC: 7.5 10*3/uL (ref 4.0–10.5)
nRBC: 0 % (ref 0.0–0.2)

## 2022-12-09 NOTE — Progress Notes (Signed)
Physical Therapy Session Note  Patient Details  Name: Terri Mayer MRN: [id number] Date of Birth: May 15, 1951  Today's Date: 12/09/2022 PT Individual Time: 0830-0930 PT Individual Time Calculation (min): 60 min   Short Term Goals: Week 1:  PT Short Term Goal 1 (Week 1): Pt will completed sit to stand with ModA + LRAD PT Short Term Goal 1 - Progress (Week 1): Met PT Short Term Goal 2 (Week 1): Pt will completed SPT with ModA + LRAD PT Short Term Goal 2 - Progress (Week 1): Met PT Short Term Goal 3 (Week 1): Pt will maintain dynamic standing with ModA + LRAD for 5 mins PT Short Term Goal 3 - Progress (Week 1): Met PT Short Term Goal 4 (Week 1): Pt will initiate ambulation activities PT Short Term Goal 4 - Progress (Week 1): Met  Skilled Therapeutic Interventions/Progress Updates:      Therapy Documentation Precautions:  Precautions Precautions: Fall Restrictions Weight Bearing Restrictions: Yes RLE Weight Bearing: Weight bearing as tolerated   Pt agreeable to PT session with emphasis on car transfer training, gait on unlevel surfaces, bed mobility and balance training. Pt reports 2/10 pain, pre-medicated and provided with rest breaks for relief.   PT donned ted hose dependently for time management and edema management. Pt transported total A by w/c for time management to ortho gym.   Pt requires distant supervision for car transfer to simulated height with minimal verbal cues for sequencing. Pt (S) for ramp negotiation on unlevel surface. Pt transitioned to bed mobility on mat table to simulate home environment. Pt requires distant supervision for supine <>sit and increased time for pain management.   Pt requires supervision for alternating marches 1 x 10 bilaterally to increase right LE ROM and weight acceptance. Pt reported feeling lightheadedness and provided with seated rest break and symptoms improved.   Pt transported total A for time management to room and left seated  in w/c at bedside with all needs in reach.   Therapy/Group: Individual Therapy  Verl Dicker Verl Dicker PT, DPT  12/09/2022, 7:39 AM

## 2022-12-09 NOTE — Progress Notes (Signed)
Occupational Therapy Session Note  Patient Details  Name: Terri Mayer MRN: [id number] Date of Birth: 1950/12/01  Today's Date: 12/10/2022 OT Individual Time: 9244-6286 OT Individual Time Calculation (min): 35 min  and Today's Date: 12/10/2022 OT Missed Time: 10 Minutes Missed Time Reason: Other (comment)  Today's Date: 12/10/2022 OT Individual Time: 3817-7116 OT Individual Time Calculation (min): 41 min   Short Term Goals: Week 2:  OT Short Term Goal 1 (Week 2): STG=LTG's d/t LOS  Skilled Therapeutic Interventions/Progress Updates:   Session 1: Pt received seated in [facility] for skilled OT session with focus on general conditioning and UE strengthening. Pt agreeable to interventions, demonstrating overall pleasant mood. Pt with un-rated pain. OT offering intermediate rest breaks and positioning suggestions throughout session to address pain/fatigue and maximize participation/safety in session.   Pt ambulates from room<>day room with supervision + RW.   In day room, pt participates in ~10 mins of Nustep machine with level 5 resistance for general conditioning.   Seated on EOM, pt completes 1 set/10 reps of following exercises: -Chest Press -Bicep Curls -Lateral Raises  Pt remained seated in [facility] with all immediate needs met at end of session. Pt continues to be appropriate for skilled OT intervention to promote further functional independence.   Session 2: Pt received seated in [facility] for skilled OT session with focus on UE strengthening. Pt agreeable to interventions, demonstrating overall pleasant mood. Pt with un-rated pain, stating "it's fine. . . The stretching in my earlier session (PT) really helped. OT offering intermediate rest breaks and positioning suggestions throughout session to address pain/fatigue and maximize participation/safety in session.   Pt ambulates from room<>day room with supervision + RW. Pt then participates in series of UE strengthening exercises: -Lateral  raises -Overhead press -Tricep extensions  Pt completes 2 sets/10-15 reps with 2-5lb DB with multimodal cuing for correct form.   Pt remained resting in bed with all immediate needs met at end of session. Pt continues to be appropriate for skilled OT intervention to promote further functional independence.   Therapy Documentation Precautions:  Precautions Precautions: Fall Restrictions Weight Bearing Restrictions: Yes RLE Weight Bearing: Weight bearing as tolerated   Therapy/Group: Individual Therapy  Maudie Mercury, OTR/L, MSOT  12/10/2022, 3:48 PM

## 2022-12-09 NOTE — Progress Notes (Signed)
Occupational Therapy Session Note  Patient Details  Name: Monic Engelmann MRN: [id number] Date of Birth: 1951/09/13  Today's Date: 12/09/2022 OT Individual Time: 1405-1450 OT Individual Time Calculation (min): 45 min    Short Term Goals: Week 2:  OT Short Term Goal 1 (Week 2): STG=LTG's d/t LOS  Skilled Therapeutic Interventions/Progress Updates:  Pt seen for skilled OT this pm. Pt up in w/c and requested continued working on rising from various surfaces and amb on alternative flooring to mirror home dining chair use and varied flooring. OT transported pt to [place] corridor lobby for time management. Pt able to amb 3 trials of 75 ft with CGA/close S with RW. OT had pt practice waiting room arm chair with pillow inside with min cues for forward weight shift for sit to stand with CGA. Pt was transported back to room but OT stopped to retrieve RW bag and adhere to RW for home use. Once back in room, pt amb from w/c to and from bathroom with close S and transferred on and off commode over toilet with CGA. Pt able to perform peri hygiene in standing. Pt requested back to bed and needed min A for LE's mngt up into bed. Left pt with needs,nursing call button and bed exit engaged.   Therapy Documentation Precautions:  Precautions Precautions: Fall Restrictions Weight Bearing Restrictions: Yes RLE Weight Bearing: Weight bearing as tolerated    Therapy/Group: Individual Therapy  Barnabas Lister 12/09/2022, 7:57 AM

## 2022-12-09 NOTE — Progress Notes (Signed)
Occupational Therapy Session Note  Patient Details  Name: Terri Mayer MRN: [id number] Date of Birth: Feb 23, 1951  Today's Date: 12/09/2022 OT Individual Time: 3704-8889 OT Individual Time Calculation (min): 73 min   Today's Date: 12/09/2022 OT Individual Time: 1694-5038 OT Individual Time Calculation (min): 32 min   Short Term Goals: Week 2:  OT Short Term Goal 1 (Week 2): STG=LTG's d/t LOS  Skilled Therapeutic Interventions/Progress Updates:   Session 1: Pt received seated in [facility] for skilled OT session with focus on ADL retraining. Pt agreeable to interventions, demonstrating overall pleasant mood. Pt with un-rated pain, excited about functional improvements. OT offering intermediate rest breaks and positioning suggestions throughout session to address potential pain/fatigue and maximize participation/safety in session.   Pt initiates bathing routine with clothing retrieval, clothes placed at a distance to simulate home environment. Pt ambulates to retrieve clothing, walking to bathroom with supervision + RW. In standing, pt doffs UB/LB garments with CGA+ RW, sitting on TTB to unthread LB garments with reacher. Pt dependent for doffing/donning of TEDs.   Pt completes full-body bathing seated on TTB, using long-handled sponge for back and lower legs/feet. In standing with use of grab bars, pt cleans anterior/posterior peri-areas with supervision.   Pt threads LB garments seated on TTB, standing with CGA to don both over bottom/hips. Pt dons t-shirt and performs hair brushing with item-retrieval seated on TTB/WC.  Pt chooses to rest in bed at end of session, performing EOB>supine with Min A for BLE elevation due to fatigue.   Pt remained resting in bed with all immediate needs met at end of session. Pt continues to be appropriate for skilled OT intervention to promote further functional independence.   Session 2: Pt received seated in [facility] for skilled OT session with focus on general  conditioning through functional mobility. Pt agreeable to interventions, demonstrating overall pleasant mood. Pt with un-rated pain this session. OT offering intermediate rest breaks and positioning suggestions throughout session to address pain/fatigue and maximize participation/safety in session.   Pt ambulates from room>equipment storage rooms past 4W wing of rehab unit, requiring overall supervision + RW. Pt trials use of rollator but continues to demonstrate need to WB through arms for effective/safe mobility, subsequently mobility aid switched back to standard RW. Pt requires extended seated rest break, asking for WC transport back to room due to fatigue.   Pt performs stand-step transfer from WC>EOB with supervision+RW, requiring min A for BLE elevation.   Pt remained resting in bed with all immediate needs met at end of session. Pt continues to be appropriate for skilled OT intervention to promote further functional independence.   Therapy Documentation Precautions:  Precautions Precautions: Fall Restrictions Weight Bearing Restrictions: Yes RLE Weight Bearing: Weight bearing as tolerated   Therapy/Group: Individual Therapy  Maudie Mercury, OTR/L, MSOT  12/09/2022, 7:56 AM

## 2022-12-09 NOTE — Progress Notes (Signed)
PROGRESS NOTE   Subjective/Complaints:  Pt waiting for pain meds- Deep breathing Trying to wait for pain meds Also needs to have BM this AM LBM yesterday x2. Is pretty normal for her.   Concerned about some nursing things she wanted to address.    ROS:  Pt denies SOB, abd pain, CP, N/V/C/D, and vision changes    Objective:   No results found. Recent Labs    12/09/22 0542  WBC 7.5  HGB 12.0  HCT 36.7  PLT 398    Recent Labs    12/09/22 0542  NA 140  K 4.3  CL 105  CO2 27  GLUCOSE 97  BUN 20  CREATININE 0.72  CALCIUM 9.4     Intake/Output Summary (Last 24 hours) at 12/09/2022 2482 Last data filed at 12/09/2022 0400 Gross per 24 hour  Intake 540 ml  Output 950 ml  Net -410 ml        Physical Exam: Vital Signs Blood pressure (!) 128/59, pulse 75, temperature 98.4 F (36.9 C), temperature source Oral, resp. rate 16, height '5\' 6"'$  (1.676 m), weight 113.6 kg, SpO2 97 %.    General: awake, alert, appropriate,  sitting up in bed; deep breathing to deal with pain; NAD HENT: conjugate gaze; oropharynx moist CV: regular rate; no JVD Pulmonary: CTA B/L; no W/R/R- good air movement GI: soft, NT, ND, (+)BS- hyperactive Psychiatric: appropriate Neurological: Ox3  Skin:  RLE with 1+ pitting edema on R foot/ankle- , no significant warmth, no erythema. LLE without any significant edema noted. Neg Homan's bilaterally.  RLE incision have sutures- not rechecked today, no drainage or erythema surrounding it, Appropriately tender to palpation- today C/D/I covered with bandage, not removed during exam  MSK:      Mild swelling around R thigh with some dark bruising discoloration--improving      Moderate hip pain with attempted ROM of both the L and R thighs      Strength:                RUE: 5/5 SA, 5/5 EF, 5/5 EE, 5/5 WE, 5/5 FF, 5/5 FA                 LUE: 5/5 SA, 5/5 EF, 5/5 EE, 5/5 WE, 5/5 FF, 5/5 FA                  RLE: 2-/5 HF, 2+/5 KE, 5/5 DF, 5/5 EHL, 5/5 PF                 LLE:  2+/5 HF, 3+/5 KE, 5/5 DF, 5/5 EHL, 5/5 PF    Neurologic exam: Sitting up in bed.  Makes eye contact with examiner.  Follows full commands. Cognition: AAO to person, place, time and event.  Language: Fluent, No substitutions or neoglisms. No dysarthria. Names 3/3 objects correctly.  Memory: Recalls 3/3 objects at 5 minutes. No apparent deficits  Insight: Good insight into current condition.  Mood: Pleasant affect, appropriate mood.  Sensation: To light touch reduced in bilateral feet and ankles Reflexes: 2+ in BL UE and LEs. Negative Hoffman's and babinski signs bilaterally.  CN: 2-12 grossly intact.  Coordination: No  apparent tremors.  Spasticity: MAS 0 in all extremities.       Assessment/Plan: 1. Functional deficits which require 3+ hours per day of interdisciplinary therapy in a comprehensive inpatient rehab setting. Physiatrist is providing close team supervision and 24 hour management of active medical problems listed below. Physiatrist and rehab team continue to assess barriers to discharge/monitor patient progress toward functional and medical goals  Care Tool:  Bathing    Body parts bathed by patient: Right arm, Left arm, Chest, Abdomen, Front perineal area, Right upper leg, Left upper leg, Face, Right lower leg, Left lower leg   Body parts bathed by helper: Buttocks     Bathing assist Assist Level: Minimal Assistance - Patient > 75%     Upper Body Dressing/Undressing Upper body dressing   What is the patient wearing?: Pull over shirt    Upper body assist Assist Level: Independent    Lower Body Dressing/Undressing Lower body dressing      What is the patient wearing?: Underwear/pull up, Pants     Lower body assist Assist for lower body dressing: Contact Guard/Touching assist     Toileting Toileting    Toileting assist Assist for toileting: Minimal Assistance - Patient > 75%      Transfers Chair/bed transfer  Transfers assist     Chair/bed transfer assist level: Maximal Assistance - Patient 25 - 49%     Locomotion Ambulation   Ambulation assist   Ambulation activity did not occur: Safety/medical concerns (limited by pain/activity tolerance)          Walk 10 feet activity   Assist  Walk 10 feet activity did not occur: Safety/medical concerns (limited by pain/activity tolerance)        Walk 50 feet activity   Assist Walk 50 feet with 2 turns activity did not occur: Safety/medical concerns (limited by pain/activity tolerance)         Walk 150 feet activity   Assist Walk 150 feet activity did not occur: Safety/medical concerns (limited by pain/activity tolerance)         Walk 10 feet on uneven surface  activity   Assist Walk 10 feet on uneven surfaces activity did not occur: Safety/medical concerns (limited by pain/activity tolerance)         Wheelchair     Assist Is the patient using a wheelchair?: Yes Type of Wheelchair: Manual Wheelchair activity did not occur: Safety/medical concerns (fatigue/endurance/pain)         Wheelchair 50 feet with 2 turns activity    Assist    Wheelchair 50 feet with 2 turns activity did not occur: Safety/medical concerns       Wheelchair 150 feet activity     Assist  Wheelchair 150 feet activity did not occur: Safety/medical concerns       Blood pressure (!) 128/59, pulse 75, temperature 98.4 F (36.9 C), temperature source Oral, resp. rate 16, height '5\' 6"'$  (1.676 m), weight 113.6 kg, SpO2 97 %.  Medical Problem List and Plan: 1. Functional deficits secondary to right comminuted subtrochanteric proximal femur fracture.  Status post intramedullary nailing of right hip 11/26/2022 (Dr. Marcelino Scot).  Weightbearing as tolerated with walker, unrestricted ROM R hip/knee, f/up outpatient with ortho in osteoporosis clinic             -patient may shower             -ELOS/Goals:  10-14 days, SPV PT/OT  -D/c date 12/13/22 Con't CIR- PT and OT- team conference tomorrow to f/u  on progress 2.  Antithrombotics: -DVT/anticoagulation:  Pharmaceutical: Eliquis 2.'5mg'$  BID - ppx for 30 days post-op (11/26/22-12/26/22).   -12/01/22 Dopplers negative for DVT -antiplatelet therapy: N/A 3. Pain Management: Tylenol 325-'650mg'$  q6h PRN, Oxycodone 5-'10mg'$  q4h PRN and Robaxin '500mg'$  q6h PRN -Chronic chemotherapy induced peripheral neuropathy - stable, no additional medications req -1/17- pain tolerable, but wanted to switch Oxy to Norco- we discussed would have to reduce amount of tylenol she gets since has tylenol in it- will wait til tomorrow to reassess -1/18- pain worse this AM, but due for meds- will see how it goes -1/19- pt doesn't want to decrease Tylenol until next Mon/Tuesday- I explained that's fine, but will monitor CMP closely.  -12/07/22 pain controlled, continue to monitor; ordered weekly labs (CMP/CBC) starting 12/09/22 1/22- ALT/AST looks good- con't tylenol- and con't Oxy prn 4. Mood/Behavior/Sleep: Provide emotional support -antipsychotic agents: N/A 5. Neuropsych/cognition: This patient is capable of making decisions on her own behalf. 6. Skin/Wound Care: Routine skin checks -11/30/22 Spoke with Ainsley Spinner PA-C for OTS, states can have daily dressing changes per nursing staff here, ok to shower and clean wounds with soap and water--ordered -12/01/22 made nursing aware of dressing change order daily   -12/07/22 continue daily dressing, reminded nursing of DAILY changes 7. Fluids/Electrolytes/Nutrition: Routine in and outs with follow-up chemistries (weekly labs next 12/09/22)  -Continue Vitamin C '1000mg'$  QD and vitamin D 2000IU BID -11/30/22 Monitor SCr, last value 1.06 on 11/28/22, encourage fluids and monitor -12/02/22 Cr 0.8 and Bun 24 stable, continue to monitor on weekly labs (12/09/22)  1/22- Cr/BUN well controlled- con't to monitor 8.  Acute blood loss anemia.  Hgb 10.6 on  11/28/22. Follow-up CBC on weekly labs starting 12/02/22 -1/15 HGB up to 12.0  -1/19- labs weekly, ordered for 12/09/22  1/22- Hb is stable at 12.0 9.  Morbid obesity.  BMI 38.94.  Dietary follow-up 10.  History of right breast cancer.  Follow-up outpatient. 11.  Constipation:  Colace BID, MiraLAX 17g QD PRN  -Per patient, LBM prior to admission during incident -Increase regimen to Sennakot-S 1 tab BID + miralax 17g QD, PRN MoM -11/30/22 doesn't feel constipated, but no BM since date of incident (11/24/22); added back Miralax 17g PRN in addition to QD dosing, added Fleet enema QD PRN and Sorbitol 70% 30-18m QD PRN; monitor -12/01/22 BM today! Continue current regimen.  -1/15 will change senokot to daily   -1/16- LBM yesterday -1/17- LBM 2 days ago- will d/c daily miralax since makes her bloated and full and increase Senna to 3 tabs daily.   -1/18- LBM yesterday- feeling better -1/19- extremely large BM last night- pt asked to move Senna back down to 2 tabs- will do so.  -12/07/22 LBM 12/05/22 but doesn't feel constipated, wants to stick with current regimen for now (Senokot 2 tabs QD, MoM 192mQD PRN, Miralax 17g QD PRN, Sorbitol 70% 30-608mD PRN) -12/08/22 BM yesterday and today, continue current regimen 1/22- LBM x2 yesterday 12. LE edema: noted 12/07/22 PM by patient but was upright in chair for a lot of the day, eval today 12/08/22 shows 1-2+ RLE edema up to mid calf, no significant tenderness or warmth, doubt DVT, suspect purely dependent edema.  -Monitor for changes, encouraged elevation of extremity, compression socks; if worsening, could consider mild diuretic but doubt this will be necessary; repeat dopplers if s/sx of DVT occur, but doubtful.   1/22- Elevating and is doing better-con't to treat- use TEDs    I spent  a total of 35   minutes on total care today- >50% coordination of care- due to  D/w pt and nursing about her concerns and bowels as well as pain control.   LOS: 10 days A  FACE TO FACE EVALUATION WAS PERFORMED  Wesson Stith 12/09/2022, 9:03 AM

## 2022-12-10 MED ORDER — DICLOFENAC SODIUM 1 % EX GEL
4.0000 g | Freq: Four times a day (QID) | CUTANEOUS | Status: DC
  Administered 2022-12-10: 4 g via TOPICAL
  Filled 2022-12-10: qty 100

## 2022-12-10 NOTE — Progress Notes (Addendum)
Patient ID: Terri Mayer, female   DOB: 08/04/1951, 72 y.o.   MRN: [id number]  Met with pt to discuss discharge Friday and needs hs paid her co-pays for her equipment and [employer] will deliver on wed to her room. Her sister is coming Friday to assist with her transition home. Discussed home health preference she has none wants one who accepts her insurance. Terri Mayer has accepted the referral. Work toward discharge Friday pt feels ready to go home.  1:10 PM Met with pt to update her regarding team conference reaching her goals of supervision-mod/I level. She is pleased with her progress and feels ready to go home aware sutures will be taken out today.

## 2022-12-10 NOTE — Progress Notes (Signed)
Physical Therapy Session Note  Patient Details  Name: Terri Mayer MRN: [id number] Date of Birth: 12/31/1950  Today's Date: 12/10/2022 PT Individual Time: 0900-0957, 1300-1415 PT Individual Time Calculation (min): 57 min , 75 min  Short Term Goals: Week 1:  PT Short Term Goal 1 (Week 1): Pt will completed sit to stand with ModA + LRAD PT Short Term Goal 1 - Progress (Week 1): Met PT Short Term Goal 2 (Week 1): Pt will completed SPT with ModA + LRAD PT Short Term Goal 2 - Progress (Week 1): Met PT Short Term Goal 3 (Week 1): Pt will maintain dynamic standing with ModA + LRAD for 5 mins PT Short Term Goal 3 - Progress (Week 1): Met PT Short Term Goal 4 (Week 1): Pt will initiate ambulation activities PT Short Term Goal 4 - Progress (Week 1): Met  Skilled Therapeutic Interventions/Progress Updates:      Therapy Documentation Precautions:  Precautions Precautions: Fall Restrictions Weight Bearing Restrictions: Yes RLE Weight Bearing: Weight bearing as tolerated  Treatment Session 1:  Pt agreeable to PT session with emphasis on stair and LE strength training. Pt reports she feels good regarding pain and received both pain medications prior to session.   PT dependently donned bilateral ted hose for time management. Pt requires supervision for supine to sit EOB and with lower body dressing in sitting/standing with RW.   Pt requests to toilet and distant supervision for ambulation to bathroom and handwashing. Pt ambulated with RW with (S) and able to verbalize appropriate gait mechanics without cueing ~150 ft to main gym.   Pt negotiated curb step x 6 with RW with supervision and minimal verbal cueing for sequencing.   Pt transitioned to right lower extremity strength training with verbal cues for form and performed following exercises:   -Supine SAQ 3 x 10   -Supine SAQ resisted 2# ankle weight 3 x 10   -S/L right clam shells 1 x 8   Pt ambulated to room (S) for safety and  left seated in w/c at bedside with all needs in reach.    Treatment Session 2:   Pt agreeable to PT session with emphasis on LE flexibility and strength training. Pt reports 3/10 pain with activity and declines medication. Provided with rest breaks for relief.   Pt performed following exercises with visual and verbal cues for form:   -Standing bilateral gastroc stretch 3 x 1 minute   -Stand hip flexion, abduction, extension 2# ankle weight 2 x 10  -Mini squats 2 x 8   PT provided supine passive right hip flexor stretch at low intensity for long duration 2 x 2 minutes. Pt transitioned to passive piriformis (figure 4) stretch to R LE 2 x 1 min.   Pt ambulated to room with (S) and left seated in w/c at bedside with all needs in reach.    Therapy/Group: Individual Therapy  Verl Dicker Verl Dicker PT, DPT  12/10/2022, 7:48 AM

## 2022-12-10 NOTE — Patient Care Conference (Signed)
Inpatient RehabilitationTeam Conference and Plan of Care Update Date: 12/10/2022   Time:11:41 AM    Patient Name: Terri Mayer      Medical Record Number: [id number]  Date of Birth: 1951-10-20 Sex: Female         Room/Bed: 4W13C/4W13C-01 Payor Info: Payor: [employer] ADVANTAGE / Plan: [employer] Must PPO / Product Type: *No Product type* /    Admit Date/Time:  11/29/2022  3:08 PM  Primary Diagnosis:  Intertrochanteric fracture of right hip [facility])  Hospital Problems: Principal Problem:   Intertrochanteric fracture of right hip [facility]) Active Problems:   Slow transit constipation    Expected Discharge Date: Expected Discharge Date: 12/13/22  Team Members Present: Physician leading conference: Dr. Courtney Heys Social Worker Present: Ovidio Kin, LCSW Nurse Present: Tacy Learn, RN PT Present: Verl Dicker, PT OT Present: Jamey Ripa, OT PPS Coordinator present : Gunnar Fusi, SLP     Current Status/Progress Goal Weekly Team Focus  Bowel/Bladder   Patient is continent of B/B.   [place] Continent of B/B   assess bowel/ bladder regamin as needed.    Swallow/Nutrition/ Hydration               ADL's   Pt is currently at SUP-CGA for BADLs, functional transfers/mobility, requiring increased levels of A with fatigue/pain.   Supervision   IADLs, generalized strengthening, and functional transfers from varying heights.    Mobility   (S) bed mobility, transfers, gait ~180 ft with RW and with curb step with RW, car transfer and ramp   Mod I  gait, pain management, weight shift    Communication                Safety/Cognition/ Behavioral Observations               Pain   Pain level at at 4 out of 10. continue scheduled/prn medications.   Pain level of <2 on a pain scale.   assess pain every qshift, and as needed.    Skin   Rifht hip incision with surgical dressing in place.   Reamin free of skin breakdown  Assess skin q shift and as needed.       Discharge Planning:  Pt is doing well and sister is coming to assist her at discharge. Working on DME and [place] follow up in prepartion for DC this week   Team Discussion: Right hip fx. Continent B/B. Pain managed with PRN medications and scheduled Tylenol. Incision is CDI without drainage. WBAT. Sutures out today. Sup/CGA for BADLs. Assist fluctuates with fatigue and pain.  Patient on target to meet rehab goals: yes, gait 180' with RW  *See Care Plan and progress notes for long and short-term goals.   Revisions to Treatment Plan:  Remove sutures, monitor labs  Teaching Needs: Medications, safety, self care, gait/transfer training, etc.   Current Barriers to Discharge: Decreased caregiver support, Home enviroment access/layout, Weight, and Weight bearing restrictions  Possible Resolutions to Barriers: Family education, nursing education, order recommended DME     Medical Summary Current Status: contiennt B/B- rates pain 5/10- goes to 6/10 with therapy- d/c sutures today  Barriers to Discharge: Weight bearing restrictions;Medical stability  Barriers to Discharge Comments: home with sister Possible Resolutions to [employer] Focus: Supervision 180 ft RW- can do steps/curbs, etc- S all ADLs- d/c 1/26   Continued Need for Acute Rehabilitation Level of Care: The patient requires daily medical management by a physician with specialized training in physical medicine and rehabilitation for the  following reasons: Direction of a multidisciplinary physical rehabilitation program to maximize functional independence : Yes Medical management of patient stability for increased activity during participation in an intensive rehabilitation regime.: Yes Analysis of laboratory values and/or radiology reports with any subsequent need for medication adjustment and/or medical intervention. : Yes   I attest that I was present, lead the team conference, and concur with the assessment and plan of the  team.   Ernest Pine 12/10/2022, 4:42 PM

## 2022-12-10 NOTE — Progress Notes (Signed)
PROGRESS NOTE   Subjective/Complaints:  Pt reports doing well Pleasant night- slept well; taking oxy less- just with therapies.  LBM 2x in last 24 hours.  A little chore to have BM.    ROS:   Pt denies SOB, abd pain, CP, N/V/C/D, and vision changes   Objective:   No results found. Recent Labs    12/09/22 0542  WBC 7.5  HGB 12.0  HCT 36.7  PLT 398    Recent Labs    12/09/22 0542  NA 140  K 4.3  CL 105  CO2 27  GLUCOSE 97  BUN 20  CREATININE 0.72  CALCIUM 9.4     Intake/Output Summary (Last 24 hours) at 12/10/2022 0905 Last data filed at 12/09/2022 2118 Gross per 24 hour  Intake 540 ml  Output --  Net 540 ml        Physical Exam: Vital Signs Blood pressure (!) 100/44, pulse 80, temperature 98.2 F (36.8 C), resp. rate 18, height '5\' 6"'$  (1.676 m), weight 113.6 kg, SpO2 96 %.     General: awake, alert, appropriate, sitting up in bed; NAD HENT: conjugate gaze; oropharynx moist CV: regular rate and rhythm; no JVD Pulmonary: CTA B/L; no W/R/R- good air movement GI: soft, NT, ND, (+)BS Psychiatric: appropriate, bright, pleasant Neurological: Ox3  Skin:  Sutures look great- ready to come out  RLE with trace- 1+ pitting edema on R foot/ankle- , no significant warmth, no erythema. LLE without any significant edema noted. Neg Homan's bilaterally.  RLE incision have sutures- not rechecked today, no drainage or erythema surrounding it, Appropriately tender to palpation- today C/D/I covered with bandage, not removed during exam  MSK:      Mild swelling around R thigh with some dark bruising discoloration--improving      Moderate hip pain with attempted ROM of both the L and R thighs      Strength:                RUE: 5/5 SA, 5/5 EF, 5/5 EE, 5/5 WE, 5/5 FF, 5/5 FA                 LUE: 5/5 SA, 5/5 EF, 5/5 EE, 5/5 WE, 5/5 FF, 5/5 FA                 RLE: 2-/5 HF, 2+/5 KE, 5/5 DF, 5/5 EHL, 5/5 PF                  LLE:  2+/5 HF, 3+/5 KE, 5/5 DF, 5/5 EHL, 5/5 PF    Neurologic exam: Sitting up in bed.  Makes eye contact with examiner.  Follows full commands. Cognition: AAO to person, place, time and event.  Language: Fluent, No substitutions or neoglisms. No dysarthria. Names 3/3 objects correctly.  Memory: Recalls 3/3 objects at 5 minutes. No apparent deficits  Insight: Good insight into current condition.  Mood: Pleasant affect, appropriate mood.  Sensation: To light touch reduced in bilateral feet and ankles Reflexes: 2+ in BL UE and LEs. Negative Hoffman's and babinski signs bilaterally.  CN: 2-12 grossly intact.  Coordination: No apparent tremors.  Spasticity: MAS 0 in all extremities.  Assessment/Plan: 1. Functional deficits which require 3+ hours per day of interdisciplinary therapy in a comprehensive inpatient rehab setting. Physiatrist is providing close team supervision and 24 hour management of active medical problems listed below. Physiatrist and rehab team continue to assess barriers to discharge/monitor patient progress toward functional and medical goals  Care Tool:  Bathing    Body parts bathed by patient: Right arm, Left arm, Chest, Abdomen, Front perineal area, Right upper leg, Left upper leg, Face, Right lower leg, Left lower leg, Buttocks   Body parts bathed by helper: Buttocks     Bathing assist Assist Level: Supervision/Verbal cueing     Upper Body Dressing/Undressing Upper body dressing   What is the patient wearing?: Pull over shirt    Upper body assist Assist Level: Independent    Lower Body Dressing/Undressing Lower body dressing      What is the patient wearing?: Underwear/pull up, Pants     Lower body assist Assist for lower body dressing: Supervision/Verbal cueing     Toileting Toileting    Toileting assist Assist for toileting: Minimal Assistance - Patient > 75%     Transfers Chair/bed transfer  Transfers assist      Chair/bed transfer assist level: Maximal Assistance - Patient 25 - 49%     Locomotion Ambulation   Ambulation assist   Ambulation activity did not occur: Safety/medical concerns (limited by pain/activity tolerance)          Walk 10 feet activity   Assist  Walk 10 feet activity did not occur: Safety/medical concerns (limited by pain/activity tolerance)        Walk 50 feet activity   Assist Walk 50 feet with 2 turns activity did not occur: Safety/medical concerns (limited by pain/activity tolerance)         Walk 150 feet activity   Assist Walk 150 feet activity did not occur: Safety/medical concerns (limited by pain/activity tolerance)         Walk 10 feet on uneven surface  activity   Assist Walk 10 feet on uneven surfaces activity did not occur: Safety/medical concerns (limited by pain/activity tolerance)         Wheelchair     Assist Is the patient using a wheelchair?: Yes Type of Wheelchair: Manual Wheelchair activity did not occur: Safety/medical concerns (fatigue/endurance/pain)         Wheelchair 50 feet with 2 turns activity    Assist    Wheelchair 50 feet with 2 turns activity did not occur: Safety/medical concerns       Wheelchair 150 feet activity     Assist  Wheelchair 150 feet activity did not occur: Safety/medical concerns       Blood pressure (!) 100/44, pulse 80, temperature 98.2 F (36.8 C), resp. rate 18, height '5\' 6"'$  (1.676 m), weight 113.6 kg, SpO2 96 %.  Medical Problem List and Plan: 1. Functional deficits secondary to right comminuted subtrochanteric proximal femur fracture.  Status post intramedullary nailing of right hip 11/26/2022 (Dr. Marcelino Scot).  Weightbearing as tolerated with walker, unrestricted ROM R hip/knee, f/up outpatient with ortho in osteoporosis clinic             -patient may shower             -ELOS/Goals: 10-14 days, SPV PT/OT  -D/c date 12/13/22 Con't CIR- PT and OT- team conference  today to f/u on progress- will remove sutures today 2.  Antithrombotics: -DVT/anticoagulation:  Pharmaceutical: Eliquis 2.'5mg'$  BID - ppx for 30 days post-op (11/26/22-12/26/22).   -  12/01/22 Dopplers negative for DVT -antiplatelet therapy: N/A 3. Pain Management: Tylenol 325-'650mg'$  q6h PRN, Oxycodone 5-'10mg'$  q4h PRN and Robaxin '500mg'$  q6h PRN -Chronic chemotherapy induced peripheral neuropathy - stable, no additional medications req -1/17- pain tolerable, but wanted to switch Oxy to Norco- we discussed would have to reduce amount of tylenol she gets since has tylenol in it- will wait til tomorrow to reassess -1/18- pain worse this AM, but due for meds- will see how it goes -1/19- pt doesn't want to decrease Tylenol until next Mon/Tuesday- I explained that's fine, but will monitor CMP closely.  -12/07/22 pain controlled, continue to monitor; ordered weekly labs (CMP/CBC) starting 12/09/22 1/22- ALT/AST looks good- con't tylenol- and con't Oxy prn 1/23- will let pt go til tomorrow-then will decrease to 500 mg q4 hours tomorrow- wait to change to Norco. Add voltaren gel for R knee pain.   4. Mood/Behavior/Sleep: Provide emotional support -antipsychotic agents: N/A 5. Neuropsych/cognition: This patient is capable of making decisions on her own behalf. 6. Skin/Wound Care: Routine skin checks -11/30/22 Spoke with Ainsley Spinner PA-C for OTS, states can have daily dressing changes per nursing staff here, ok to shower and clean wounds with soap and water--ordered -12/01/22 made nursing aware of dressing change order daily   -12/07/22 continue daily dressing, reminded nursing of DAILY changes 7. Fluids/Electrolytes/Nutrition: Routine in and outs with follow-up chemistries (weekly labs next 12/09/22)  -Continue Vitamin C '1000mg'$  QD and vitamin D 2000IU BID -11/30/22 Monitor SCr, last value 1.06 on 11/28/22, encourage fluids and monitor -12/02/22 Cr 0.8 and Bun 24 stable, continue to monitor on weekly labs (12/09/22)  1/22-  Cr/BUN well controlled- con't to monitor 8.  Acute blood loss anemia.  Hgb 10.6 on 11/28/22. Follow-up CBC on weekly labs starting 12/02/22 -1/15 HGB up to 12.0  -1/19- labs weekly, ordered for 12/09/22  1/22- Hb is stable at 12.0 9.  Morbid obesity.  BMI 38.94.  Dietary follow-up 10.  History of right breast cancer.  Follow-up outpatient. 11.  Constipation:  Colace BID, MiraLAX 17g QD PRN  -Per patient, LBM prior to admission during incident -Increase regimen to Sennakot-S 1 tab BID + miralax 17g QD, PRN MoM -11/30/22 doesn't feel constipated, but no BM since date of incident (11/24/22); added back Miralax 17g PRN in addition to QD dosing, added Fleet enema QD PRN and Sorbitol 70% 30-72m QD PRN; monitor -12/01/22 BM today! Continue current regimen.  -1/15 will change senokot to daily   -1/16- LBM yesterday -1/17- LBM 2 days ago- will d/c daily miralax since makes her bloated and full and increase Senna to 3 tabs daily.   -1/18- LBM yesterday- feeling better -1/19- extremely large BM last night- pt asked to move Senna back down to 2 tabs- will do so.  -12/07/22 LBM 12/05/22 but doesn't feel constipated, wants to stick with current regimen for now (Senokot 2 tabs QD, MoM 150mQD PRN, Miralax 17g QD PRN, Sorbitol 70% 30-6046mD PRN) -12/08/22 BM yesterday and today, continue current regimen 1/22- LBM x2 yesterday  1/23- will think if needs to increase Senna in next 24 hours, but did have 2 Bms- were harder to go though 12. LE edema: noted 12/07/22 PM by patient but was upright in chair for a lot of the day, eval today 12/08/22 shows 1-2+ RLE edema up to mid calf, no significant tenderness or warmth, doubt DVT, suspect purely dependent edema.  -Monitor for changes, encouraged elevation of extremity, compression socks; if worsening, could consider mild diuretic  but doubt this will be necessary; repeat dopplers if s/sx of DVT occur, but doubtful.   1/22- Elevating and is doing better-con't to treat- use TEDs    1/23- much better- con't to elevate as needed    I spent a total of 41    minutes on total care today- >50% coordination of care- due to  Team conference today as well as discussed with nursing about R knee pain and with pt about sutures and tylenol.   LOS: 11 days A FACE TO FACE EVALUATION WAS PERFORMED  Rachele Lamaster 12/10/2022, 9:05 AM

## 2022-12-11 NOTE — Progress Notes (Signed)
Recreational Therapy Session Note  Patient Details  Name: Terri Mayer MRN: [id number] Date of Birth: 03-18-1951 Today's Date: 12/11/2022 No c/o Pt participated in animal assisted activity setaed w/c level with supervision.  Pt appreciative of this visit. [place] 12/11/2022, 1:34 PM

## 2022-12-11 NOTE — Progress Notes (Incomplete)
Inpatient Rehabilitation Discharge Medication Review by a Pharmacist  A complete drug regimen review was completed for this patient to identify any potential clinically significant medication issues.  High Risk Drug Classes Is patient taking? Indication by Medication  Antipsychotic No   Anticoagulant Yes Eliquis - ortho VTE ppx x 30 days (through 12/26/22 and stop).  Antibiotic No   Opioid Yes Oxycodone - pain management  Antiplatelet No   Hypoglycemics/insulin No   Vasoactive Medication No   Chemotherapy No   Other Yes Acetaminophen- pan management Robaxin - muscle spasms Vit D3, Vit C - supplements      Type of Medication Issue Identified Description of Issue Recommendation(s)  Drug Interaction(s) (clinically significant)     Duplicate Therapy     Allergy     No Medication Administration End Date     Incorrect Dose     Additional Drug Therapy Needed     Significant med changes from prior encounter (inform family/care partners about these prior to discharge).    Other       Clinically significant medication issues were identified that warrant physician communication and completion of prescribed/recommended actions by midnight of the next day:  No  Name of provider notified for urgent issues identified:   Provider Method of Notification:     Pharmacist comments:   Time spent performing this drug regimen review (minutes):  [place], RPh Clinical Pharmacist

## 2022-12-11 NOTE — Plan of Care (Signed)
Pt alert and oriented x 4. Pt up with walker with stand by assist. Gait belt used. No prns given. Pt only taken scheduled tylenol for pain. Pain level #1 during overnight. Refused Voltaren gel due to afraid of taking with eliquis. Msg sent to pharmacy. Pt wanted to wait to speak with provider this am. Pt needed assistance with lifting leg up bed and elevating with pillows.  Problem: Consults Goal: RH GENERAL PATIENT EDUCATION Description: See Patient Education module for education specifics. Outcome: Progressing   Problem: RH BOWEL ELIMINATION Goal: RH STG MANAGE BOWEL WITH ASSISTANCE Description: STG Manage Bowel with min Assistance. Outcome: Progressing Goal: RH STG MANAGE BOWEL W/MEDICATION W/ASSISTANCE Description: STG Manage Bowel with Medication with min Assistance. Outcome: Progressing   Problem: RH SKIN INTEGRITY Goal: RH STG SKIN FREE OF INFECTION/BREAKDOWN Description: Skin will be free of any infection/breakdown with min assist Outcome: Progressing Goal: RH STG ABLE TO PERFORM INCISION/WOUND CARE W/ASSISTANCE Description: STG Able To Perform Incision/Wound Care With min Assistance. Outcome: Progressing   Problem: RH SAFETY Goal: RH STG ADHERE TO SAFETY PRECAUTIONS W/ASSISTANCE/DEVICE Description: STG Adhere to Safety Precautions With min Assistance/Device. Outcome: Progressing   Problem: RH PAIN MANAGEMENT Goal: RH STG PAIN MANAGED AT OR BELOW PT'S PAIN GOAL Description: Pain will be managed at 4 out of 10 on pain scale with PRN medications min assist Outcome: Progressing   Problem: RH KNOWLEDGE DEFICIT GENERAL Goal: RH STG INCREASE KNOWLEDGE OF SELF CARE AFTER HOSPITALIZATION Description: Patient/caregiver will be able to manage medications and self care from nursing education and nursing handouts independently  Outcome: Progressing   Problem: Activity: Goal: Ability to ambulate and perform ADLs will improve Outcome: Progressing   Problem: Self-Concept: Goal:  Ability to maintain and perform role responsibilities to the fullest extent possible will improve Outcome: Progressing   Problem: Pain Management: Goal: Pain level will decrease Outcome: Progressing

## 2022-12-11 NOTE — Progress Notes (Signed)
Physical Therapy Session Note  Patient Details  Name: Terri Mayer MRN: [id number] Date of Birth: 1950-11-19  Today's Date: 12/11/2022 PT Individual Time: 1638-4665 PT Individual Time Calculation (min): 60 min   Short Term Goals: Week 1:  PT Short Term Goal 1 (Week 1): Pt will completed sit to stand with ModA + LRAD PT Short Term Goal 1 - Progress (Week 1): Met PT Short Term Goal 2 (Week 1): Pt will completed SPT with ModA + LRAD PT Short Term Goal 2 - Progress (Week 1): Met PT Short Term Goal 3 (Week 1): Pt will maintain dynamic standing with ModA + LRAD for 5 mins PT Short Term Goal 3 - Progress (Week 1): Met PT Short Term Goal 4 (Week 1): Pt will initiate ambulation activities PT Short Term Goal 4 - Progress (Week 1): Met  Skilled Therapeutic Interventions/Progress Updates:      Therapy Documentation Precautions:  Precautions Precautions: Fall Restrictions Weight Bearing Restrictions: Yes RLE Weight Bearing: Weight bearing as tolerated  Pt agreeable to PT session and reports 1/10 right LE pain, pre-medicated and provided with repositioning for relief.   Pt ambulated with (S) for safety with RW ~150 ft to main gym with improved step length symmetry.   PT provided pt with following HEP with emphasis on flexibility, range of motion and strength training and pt demonstrated the following exercises:   Exercises - Seated Figure 4 Piriformis Stretch  - 2 x daily - 5-7 x weekly - 1 sets - 3 reps - 30 s hold - Seated Hamstring Stretch  - 2 x daily - 5-7 x weekly - 1 sets - 3 reps - 30 s hold - Seated Hip Adductor Stretch  - 2 x daily - 5-7 x weekly - 1 sets - 3 reps - 30 s hold - Standing Hip Flexor Stretch  - 2 x daily - 5-7 x weekly - 1 sets - 3 reps - 30 s hold - Standing 3-way Hip with Walker  - 1 x daily - 5-7 x weekly - 3 sets - 10 reps  Pt ambulated to room and left seated in w/c at bedside with all needs in reach.   Access Code: [email or web address] URL:  [email or web address] Date: 12/11/2022 Prepared by: Verl Dicker  Therapy/Group: Individual Therapy  Verl Dicker Verl Dicker PT, DPT  12/11/2022, 7:47 AM

## 2022-12-11 NOTE — Progress Notes (Signed)
Occupational Therapy Session Note  Patient Details  Name: Terri Mayer MRN: [id number] Date of Birth: 05/07/1951  Today's Date: 12/11/2022 OT Individual Time: 1400-1505 OT Individual Time Calculation (min): 65 min    Short Term Goals: Week 2:  OT Short Term Goal 1 (Week 2): STG=LTG's d/t LOS  Skilled Therapeutic Interventions/Progress Updates:   Pt seen for skilled OT session with focus on integ amb with RW into A/IADL's. Pt requesting toileitng upon arrival and pt able to amb with RW to and from bathroom with close s. Voiding on toilet with S onl for LB garment mngt, peri hygiene and then sink side hand washing in standing. OT transported to main gym space for time mngt and pt then able to utilize shopping cart for 50 ft amb with CGA and antalgic gait due to increased WB through R LE needed. Reported no more than 1-2/10 pain however.  Seated rest on arm chair and then 2 sets of 10 min functional reach activity and retrieval of floor items while amb with use of reacher to retrieve. Overall needed close s for all. Pt had laundry placed in washer earlier and OT addressed transfer of clothing from washer to dryer with use of RW for standing support and reacher for item retrieval with CGA. Once back in room, pt amb with RW back to bed with close S, doffed shoes with reacher with mod I and pants with reacher as well. S only for bed positioning. Left pt bed level with exit alarm engaged, needs and nurse call button in reach.   Therapy Documentation Precautions:  Precautions Precautions: Fall Restrictions Weight Bearing Restrictions: Yes RLE Weight Bearing: Weight bearing as tolerated   Therapy/Group: Individual Therapy  Barnabas Lister 12/11/2022, 7:53 AM

## 2022-12-11 NOTE — Progress Notes (Signed)
Occupational Therapy Session Note  Patient Details  Name: Terri Mayer MRN: [id number] Date of Birth: 10/02/1951  Today's Date: 12/11/2022 OT Individual Time: 1115-1202 OT Individual Time Calculation (min): 47 min    Short Term Goals: Week 2:  OT Short Term Goal 1 (Week 2): STG=LTG's d/t LOS  Skilled Therapeutic Interventions/Progress Updates:  Pt received seated on toilet with NT present for skilled OT session with focus on BADL retraining and general conditioning. Pt agreeable to interventions, demonstrating overall pleasant mood. Pt with un-rated pain, stating "I'm doing great. . " in reference to pain. OT offering intermediate rest breaks and positioning suggestions throughout session to address pain/fatigue and maximize participation/safety in session.   Pt with episode of urine incontinence, benefiting from the opportunity to take a shower. Pt performs toilet/shower transfer with close supervision + RW. Doffing socks/TEDs with increased A due to time constraints. Pt performs full-body shower with supervision + grab bars for standing peri-area cleaning. Pt dons UB/LB garments with supervision + RW.  Pt then completes household-level functional mobility around nurses station and day room for general conditioning at supervision level.   Pt remained seated in [facility] with all immediate needs met at end of session. Pt continues to be appropriate for skilled OT intervention to promote further functional independence.   Therapy Documentation Precautions:  Precautions Precautions: Fall Restrictions Weight Bearing Restrictions: Yes RLE Weight Bearing: Weight bearing as tolerated   Therapy/Group: Individual Therapy  Maudie Mercury, OTR/L, MSOT  12/11/2022, 8:03 AM

## 2022-12-11 NOTE — Progress Notes (Signed)
Physical Therapy Session Note  Patient Details  Name: Terri Mayer MRN: [id number] Date of Birth: 01-31-1951  Today's Date: 12/11/2022 PT Individual Time: 0830-0925 PT Individual Time Calculation (min): 55 min   Short Term Goals: Week 2:  PT Short Term Goal 1 (Week 2): STG=LTG due to ELOS  Skilled Therapeutic Interventions/Progress Updates:    Chart reviewed and pt agreeable to therapy. Pt received seated in WC with no c/o pain. Session focused on functional mobility and amb endurance to promote home access. Pt initiated session with amb >157f to therapy gym  using supervision + RW. Pt then completed 4 steps with B rails + supervision. Pt then amb >1058fto second therapy gym with ModI + RW. In gym, pt completed car transfer and amb of ramp with ModI + RW. Pt then taken to first floor. Pt amb >10 mins for >50048fround first floor spaces with ModI + RW. Pt stated she had not been on alarm, so PT confirmed verbal auth with RN to not use alarm. At end of session, pt was [facility] and all needs in reach.     Therapy Documentation Precautions:  Precautions Precautions: Fall Restrictions Weight Bearing Restrictions: Yes RLE Weight Bearing: Weight bearing as tolerated    Therapy/Group: Individual Therapy  KirMarquette OldT, DPT 12/11/2022, 11:58 AM

## 2022-12-11 NOTE — Progress Notes (Signed)
Physical Therapy Session Note  Patient Details  Name: Terri Mayer MRN: [id number] Date of Birth: 1951/04/20  Today's Date: 12/11/2022 PT Individual Time: 1300-1329 PT Individual Time Calculation (min): 29 min   Short Term Goals: Week 2:  PT Short Term Goal 1 (Week 2): STG=LTG due to ELOS  Skilled Therapeutic Interventions/Progress Updates:      Pt sitting in w/c to start - ready for therapy session. No reports of pain.  Transported in w/c to day room rehab gym for time management. Ambulatory transfer with supervision and RW to Nustep. SetupA on nustep and she completed 20 minutes at L6 resistance, using BUE/BLE combo to challenge cardiovascular endurance and encourage full ROM on L. 1060 steps total. Cadence kept ~60 steps/minute throughout.  Returned to her room and patient in agreement to stay in w/c. All needs met, call bell in reach at end of session. Pt excited to DC home soon   Therapy Documentation Precautions:  Precautions Precautions: Fall Restrictions Weight Bearing Restrictions: Yes RLE Weight Bearing: Weight bearing as tolerated General:    Therapy/Group: Individual Therapy  Alger Simons 12/11/2022, 7:48 AM

## 2022-12-11 NOTE — Progress Notes (Signed)
PROGRESS NOTE   Subjective/Complaints:  Doesn't want to decrease tylenol dosing til tomorrow- educated that WILL decrease tomorrow, since at 4Grams/day currently which is against guidelines.   Pt also notes harder to have BM- will likely increase Senna tomorrow, but notes that doesn't want BM's interfering in therapy.    ROS:   Pt denies SOB, abd pain, CP, N/V/ (+)C/D, and vision changes  Objective:   No results found. Recent Labs    12/09/22 0542  WBC 7.5  HGB 12.0  HCT 36.7  PLT 398    Recent Labs    12/09/22 0542  NA 140  K 4.3  CL 105  CO2 27  GLUCOSE 97  BUN 20  CREATININE 0.72  CALCIUM 9.4     Intake/Output Summary (Last 24 hours) at 12/11/2022 0935 Last data filed at 12/10/2022 1919 Gross per 24 hour  Intake 360 ml  Output --  Net 360 ml        Physical Exam: Vital Signs Blood pressure (!) 124/53, pulse 72, temperature 98.5 F (36.9 C), resp. rate 18, height '5\' 6"'$  (1.676 m), weight 113.6 kg, SpO2 96 %.      General: awake, alert, appropriate,  sitting up in bed; NAD HENT: conjugate gaze; oropharynx moist CV: regular rate; no JVD Pulmonary: CTA B/L; no W/R/R- good air movement GI: soft, NT, ND, (+)BS Psychiatric: appropriate- more cheerful Neurological: Ox3  Skin:  Sutures look great- ready to come out  RLE with trace- 1+ pitting edema on R foot/ankle- , no significant warmth, no erythema. LLE without any significant edema noted. Neg Homan's bilaterally.  RLE incision have sutures- not rechecked today, no drainage or erythema surrounding it, Appropriately tender to palpation- today C/D/I covered with bandage, not removed during exam  MSK:      Mild swelling around R thigh with some dark bruising discoloration--improving      Moderate hip pain with attempted ROM of both the L and R thighs      Strength:                RUE: 5/5 SA, 5/5 EF, 5/5 EE, 5/5 WE, 5/5 FF, 5/5 FA                  LUE: 5/5 SA, 5/5 EF, 5/5 EE, 5/5 WE, 5/5 FF, 5/5 FA                 RLE: 2-/5 HF, 2+/5 KE, 5/5 DF, 5/5 EHL, 5/5 PF                 LLE:  2+/5 HF, 3+/5 KE, 5/5 DF, 5/5 EHL, 5/5 PF    Neurologic exam: Sitting up in bed.  Makes eye contact with examiner.  Follows full commands. Cognition: AAO to person, place, time and event.  Language: Fluent, No substitutions or neoglisms. No dysarthria. Names 3/3 objects correctly.  Memory: Recalls 3/3 objects at 5 minutes. No apparent deficits  Insight: Good insight into current condition.  Mood: Pleasant affect, appropriate mood.  Sensation: To light touch reduced in bilateral feet and ankles Reflexes: 2+ in BL UE and LEs. Negative Hoffman's and babinski signs bilaterally.  CN: 2-12 grossly intact.  Coordination: No apparent tremors.  Spasticity: MAS 0 in all extremities.       Assessment/Plan: 1. Functional deficits which require 3+ hours per day of interdisciplinary therapy in a comprehensive inpatient rehab setting. Physiatrist is providing close team supervision and 24 hour management of active medical problems listed below. Physiatrist and rehab team continue to assess barriers to discharge/monitor patient progress toward functional and medical goals  Care Tool:  Bathing    Body parts bathed by patient: Right arm, Left arm, Chest, Abdomen, Front perineal area, Right upper leg, Left upper leg, Face, Right lower leg, Left lower leg, Buttocks   Body parts bathed by helper: Buttocks     Bathing assist Assist Level: Supervision/Verbal cueing     Upper Body Dressing/Undressing Upper body dressing   What is the patient wearing?: Pull over shirt    Upper body assist Assist Level: Independent    Lower Body Dressing/Undressing Lower body dressing      What is the patient wearing?: Underwear/pull up, Pants     Lower body assist Assist for lower body dressing: Supervision/Verbal cueing     Toileting Toileting    Toileting assist  Assist for toileting: Minimal Assistance - Patient > 75%     Transfers Chair/bed transfer  Transfers assist     Chair/bed transfer assist level: Maximal Assistance - Patient 25 - 49%     Locomotion Ambulation   Ambulation assist   Ambulation activity did not occur: Safety/medical concerns (limited by pain/activity tolerance)          Walk 10 feet activity   Assist  Walk 10 feet activity did not occur: Safety/medical concerns (limited by pain/activity tolerance)        Walk 50 feet activity   Assist Walk 50 feet with 2 turns activity did not occur: Safety/medical concerns (limited by pain/activity tolerance)         Walk 150 feet activity   Assist Walk 150 feet activity did not occur: Safety/medical concerns (limited by pain/activity tolerance)         Walk 10 feet on uneven surface  activity   Assist Walk 10 feet on uneven surfaces activity did not occur: Safety/medical concerns (limited by pain/activity tolerance)         Wheelchair     Assist Is the patient using a wheelchair?: Yes Type of Wheelchair: Manual Wheelchair activity did not occur: Safety/medical concerns (fatigue/endurance/pain)         Wheelchair 50 feet with 2 turns activity    Assist    Wheelchair 50 feet with 2 turns activity did not occur: Safety/medical concerns       Wheelchair 150 feet activity     Assist  Wheelchair 150 feet activity did not occur: Safety/medical concerns       Blood pressure (!) 124/53, pulse 72, temperature 98.5 F (36.9 C), resp. rate 18, height '5\' 6"'$  (1.676 m), weight 113.6 kg, SpO2 96 %.  Medical Problem List and Plan: 1. Functional deficits secondary to right comminuted subtrochanteric proximal femur fracture.  Status post intramedullary nailing of right hip 11/26/2022 (Dr. Marcelino Scot).  Weightbearing as tolerated with walker, unrestricted ROM R hip/knee, f/up outpatient with ortho in osteoporosis clinic             -patient may  shower             -ELOS/Goals: 10-14 days, SPV PT/OT  -D/c date 12/13/22 Con't CIR- PT and OT_ walking and getting in/out of  car 2.  Antithrombotics: -DVT/anticoagulation:  Pharmaceutical: Eliquis 2.'5mg'$  BID - ppx for 30 days post-op (11/26/22-12/26/22).   -12/01/22 Dopplers negative for DVT -antiplatelet therapy: N/A 3. Pain Management: Tylenol 325-'650mg'$  q6h PRN, Oxycodone 5-'10mg'$  q4h PRN and Robaxin '500mg'$  q6h PRN -Chronic chemotherapy induced peripheral neuropathy - stable, no additional medications req -1/17- pain tolerable, but wanted to switch Oxy to Norco- we discussed would have to reduce amount of tylenol she gets since has tylenol in it- will wait til tomorrow to reassess -1/18- pain worse this AM, but due for meds- will see how it goes -1/19- pt doesn't want to decrease Tylenol until next Mon/Tuesday- I explained that's fine, but will monitor CMP closely.  -12/07/22 pain controlled, continue to monitor; ordered weekly labs (CMP/CBC) starting 12/09/22 1/22- ALT/AST looks good- con't tylenol- and con't Oxy prn 1/23- will let pt go til tomorrow-then will decrease to 500 mg q4 hours tomorrow- wait to change to Norco. Add voltaren gel for R knee pain.   1/24- will decrease tylenol to 500 mg q4 hours starting tomorrow-pI'm concerned about 4G/day for so long-   4. Mood/Behavior/Sleep: Provide emotional support -antipsychotic agents: N/A 5. Neuropsych/cognition: This patient is capable of making decisions on her own behalf. 6. Skin/Wound Care: Routine skin checks -11/30/22 Spoke with Ainsley Spinner PA-C for OTS, states can have daily dressing changes per nursing staff here, ok to shower and clean wounds with soap and water--ordered -12/01/22 made nursing aware of dressing change order daily   -12/07/22 continue daily dressing, reminded nursing of DAILY changes  1/24- sutures out 7. Fluids/Electrolytes/Nutrition: Routine in and outs with follow-up chemistries (weekly labs next 12/09/22)  -Continue Vitamin  C '1000mg'$  QD and vitamin D 2000IU BID -11/30/22 Monitor SCr, last value 1.06 on 11/28/22, encourage fluids and monitor -12/02/22 Cr 0.8 and Bun 24 stable, continue to monitor on weekly labs (12/09/22)  1/22- Cr/BUN well controlled- con't to monitor 8.  Acute blood loss anemia.  Hgb 10.6 on 11/28/22. Follow-up CBC on weekly labs starting 12/02/22 -1/15 HGB up to 12.0  -1/19- labs weekly, ordered for 12/09/22  1/22- Hb is stable at 12.0 9.  Morbid obesity.  BMI 38.94.  Dietary follow-up 10.  History of right breast cancer.  Follow-up outpatient. 11.  Constipation:  Colace BID, MiraLAX 17g QD PRN  -Per patient, LBM prior to admission during incident -Increase regimen to Sennakot-S 1 tab BID + miralax 17g QD, PRN MoM -11/30/22 doesn't feel constipated, but no BM since date of incident (11/24/22); added back Miralax 17g PRN in addition to QD dosing, added Fleet enema QD PRN and Sorbitol 70% 30-30m QD PRN; monitor -12/07/22 LBM 12/05/22 but doesn't feel constipated, wants to stick with current regimen for now (Senokot 2 tabs QD, MoM 161mQD PRN, Miralax 17g QD PRN, Sorbitol 70% 30-6026mD PRN) 1/24- will wait to increase Senna- but pt admits not "going enough"- but doesn't want lage BM interfering with therapy.  12. LE edema: noted 12/07/22 PM by patient but was upright in chair for a lot of the day, eval today 12/08/22 shows 1-2+ RLE edema up to mid calf, no significant tenderness or warmth, doubt DVT, suspect purely dependent edema.  -Monitor for changes, encouraged elevation of extremity, compression socks; if worsening, could consider mild diuretic but doubt this will be necessary; repeat dopplers if s/sx of DVT occur, but doubtful.   1/22- Elevating and is doing better-con't to treat- use TEDs   1/23- much better- con't to elevate as needed  1/24-0  wearing TEDs- very helpful   I spent a total of 36   minutes on total care today- >50% coordination of care- due to  Due to educating pt about tylenol- as well  as seeing 2x- watching walk with RW; also d/w nursing about sutures LOS: 12 days A FACE TO FACE EVALUATION WAS PERFORMED  Miasia Crabtree 12/11/2022, 9:35 AM

## 2022-12-12 ENCOUNTER — Encounter: Payer: Self-pay | Admitting: Oncology

## 2022-12-12 ENCOUNTER — Other Ambulatory Visit (HOSPITAL_COMMUNITY): Payer: Self-pay

## 2022-12-12 MED ORDER — OXYCODONE HCL 5 MG PO TABS
5.0000 mg | ORAL_TABLET | ORAL | 0 refills | Status: DC | PRN
  Filled 2022-12-12: qty 30, 3d supply, fill #0

## 2022-12-12 MED ORDER — DOCUSATE SODIUM 100 MG PO CAPS
100.0000 mg | ORAL_CAPSULE | Freq: Every day | ORAL | Status: DC
  Administered 2022-12-12 – 2022-12-13 (×2): 100 mg via ORAL
  Filled 2022-12-12 (×2): qty 1

## 2022-12-12 MED ORDER — ACETAMINOPHEN 325 MG PO TABS: 650.0000 mg | ORAL_TABLET | ORAL | Status: DC

## 2022-12-12 MED ORDER — VITAMIN D3 25 MCG PO TABS
2000.0000 [IU] | ORAL_TABLET | Freq: Two times a day (BID) | ORAL | 0 refills | Status: DC
  Filled 2022-12-12: qty 60, 15d supply, fill #0

## 2022-12-12 MED ORDER — ACETAMINOPHEN 500 MG PO TABS
500.0000 mg | ORAL_TABLET | ORAL | Status: DC
  Administered 2022-12-12 – 2022-12-13 (×6): 500 mg via ORAL
  Filled 2022-12-12 (×6): qty 1

## 2022-12-12 MED ORDER — APIXABAN 2.5 MG PO TABS
2.5000 mg | ORAL_TABLET | Freq: Two times a day (BID) | ORAL | 0 refills | Status: DC
  Filled 2022-12-12: qty 28, 14d supply, fill #0

## 2022-12-12 MED ORDER — SENNA 8.6 MG PO TABS
1.0000 | ORAL_TABLET | Freq: Once | ORAL | Status: AC
  Administered 2022-12-12: 8.6 mg via ORAL
  Filled 2022-12-12: qty 1

## 2022-12-12 MED ORDER — METHOCARBAMOL 500 MG PO TABS
500.0000 mg | ORAL_TABLET | Freq: Four times a day (QID) | ORAL | 0 refills | Status: DC | PRN
  Filled 2022-12-12: qty 60, 15d supply, fill #0

## 2022-12-12 MED ORDER — SENNOSIDES-DOCUSATE SODIUM 8.6-50 MG PO TABS: 2.0000 | ORAL_TABLET | Freq: Every day | ORAL | Status: DC

## 2022-12-12 MED ORDER — DICLOFENAC SODIUM 1 % EX GEL
4.0000 g | Freq: Four times a day (QID) | CUTANEOUS | 0 refills | Status: DC
  Filled 2022-12-12: qty 400, 25d supply, fill #0

## 2022-12-12 MED ORDER — ASCORBIC ACID 1000 MG PO TABS
1000.0000 mg | ORAL_TABLET | Freq: Every day | ORAL | 0 refills | Status: DC
  Filled 2022-12-12: qty 30, 30d supply, fill #0

## 2022-12-12 NOTE — Plan of Care (Signed)
  Problem: RH Balance Goal: LTG Patient will maintain dynamic standing with ADLs (OT) Description: LTG:  Patient will maintain dynamic standing balance with assist during activities of daily living (OT)  Outcome: Completed/Met   Problem: Sit to Stand Goal: LTG:  Patient will perform sit to stand in prep for activites of daily living with assistance level (OT) Description: LTG:  Patient will perform sit to stand in prep for activites of daily living with assistance level (OT) Outcome: Completed/Met   Problem: RH Bathing Goal: LTG Patient will bathe all body parts with assist levels (OT) Description: LTG: Patient will bathe all body parts with assist levels (OT) Outcome: Completed/Met   Problem: RH Dressing Goal: LTG Patient will perform lower body dressing w/assist (OT) Description: LTG: Patient will perform lower body dressing with assist, with/without cues in positioning using equipment (OT) Outcome: Completed/Met   Problem: RH Toileting Goal: LTG Patient will perform toileting task (3/3 steps) with assistance level (OT) Description: LTG: Patient will perform toileting task (3/3 steps) with assistance level (OT)  Outcome: Completed/Met   Problem: RH Toilet Transfers Goal: LTG Patient will perform toilet transfers w/assist (OT) Description: LTG: Patient will perform toilet transfers with assist, with/without cues using equipment (OT) Outcome: Completed/Met   Problem: RH Tub/Shower Transfers Goal: LTG Patient will perform tub/shower transfers w/assist (OT) Description: LTG: Patient will perform tub/shower transfers with assist, with/without cues using equipment (OT) Outcome: Completed/Met   

## 2022-12-12 NOTE — Progress Notes (Signed)
Occupational Therapy Discharge Summary  Patient Details  Name: Terri Mayer MRN: [id number] Date of Birth: [birth year]  Date of Discharge from OT service:December 12, 2022  Today's Date: 12/12/2022 OT Individual Time: 1015-1140 OT Individual Time Calculation (min): 85 min    Patient has met 8 of 8 long term goals due to improved activity tolerance, improved balance, postural control, ability to compensate for deficits, and functional use of  RIGHT upper, RIGHT lower, LEFT upper, and LEFT lower extremity.  Patient to discharge at overall Modified Independent level.  Patient's care partner is independent to provide the necessary physical assistance at discharge.    Reasons goals not met:n/a   Recommendation:  Patient will benefit from ongoing skilled OT services in home health setting to continue to advance functional skills in the area of BADL, iADL, and Reduce care partner burden.  Equipment: TTB, RW, RW bag, LH sponge,l 3 in 1 commode. Pt has reachers at home   Reasons for discharge: treatment goals met  Patient/family agrees with progress made and goals achieved: Yes  OT Discharge Precautions/Restrictions  Precautions Precautions: Fall Restrictions RLE Weight Bearing: Weight bearing as tolerated General   Vital Signs  Pain Pain Assessment Pain Scale: 0-10 Pain Score: 1  Pain Type: Surgical pain Pain Location: Hip Pain Orientation: Right Pain Descriptors / Indicators: Aching Pain Onset: On-going Patients Stated Pain Goal: 0 Pain Intervention(s): Pain med given for lower pain score than stated, per patient request;Rest;Shower Multiple Pain Sites: No ADL ADL Equipment Provided: Leg lifter, Long-handled sponge Eating: Independent Where Assessed-Eating: Bed level Grooming: Independent Where Assessed-Grooming: Sitting at sink, Standing at sink Upper Body Bathing: Modified independent Where Assessed-Upper Body Bathing: Shower Lower Body Bathing: Modified  independent Where Assessed-Lower Body Bathing: Shower Upper Body Dressing: Independent Where Assessed-Upper Body Dressing: Edge of bed Lower Body Dressing: Modified independent Where Assessed-Lower Body Dressing: Wheelchair, Standing at sink, Sitting at sink Toileting: Modified independent Where Assessed-Toileting: Recruitment consultant Transfer: Modified independent Armed forces technical officer Method: Stand pivot, Counselling psychologist: Radiographer, therapeutic: Modified independent Clinical cytogeneticist Method: Optometrist: Nurse, mental health Transfer: Modified independent Social research officer, government Method: Heritage manager: Transfer tub bench ADL Comments: Pt now has progressed to mod I with AE and DME. Has all needed devices and equipement for safe discharge home. Pt cl,eared for mod I in room by PT as well, Vision Baseline Vision/History: 1 Wears glasses Vision Assessment?: No apparent visual deficits Perception  Perception: Within Functional Limits Praxis Praxis: Intact Cognition Cognition Overall Cognitive Status: Within Functional Limits for tasks assessed Arousal/Alertness: Awake/alert Orientation Level: Person;Place;Situation Memory: Appears intact Awareness: Appears intact Problem Solving: Appears intact Safety/Judgment: Appears intact Brief Interview for Mental Status (BIMS) Repetition of Three Words (First Attempt): 3 Temporal Orientation: Year: Correct Temporal Orientation: Month: Accurate within 5 days Temporal Orientation: Day: Correct Recall: "Sock": Yes, no cue required Recall: "Blue": Yes, no cue required Recall: "Bed": Yes, no cue required BIMS Summary Score: 15 Sensation Sensation Light Touch: Appears Intact Hot/Cold: Appears Intact Proprioception: Impaired Detail Stereognosis: Appears Intact Additional Comments: Bilateral neuropathy in feet due to chemo. Coordination Gross Motor  Movements are Fluid and Coordinated: Yes Fine Motor Movements are Fluid and Coordinated: Yes Coordination and Movement Description: grossly coordinated and improved strength Finger Nose Finger Test: [facility] Heel Shin Test: limited R LE due to ROM & strength deficits, pain Motor  Motor Motor: Within Functional Limits Motor - Skilled Clinical Observations: grossly coordinated and improved strength Mobility  Bed  Mobility Bed Mobility: Rolling Right;Rolling Left;Sit to Supine;Supine to Sit Rolling Right: Independent with assistive device Rolling Left: Independent with assistive device Supine to Sit: Independent with assistive device Sit to Supine: Independent with assistive device Transfers Sit to Stand: Independent with assistive device Stand to Sit: Independent with assistive device  Trunk/Postural Assessment  Cervical Assessment Cervical Assessment: Exceptions to [facility] Thoracic Assessment Thoracic Assessment: Exceptions to [facility] Lumbar Assessment Lumbar Assessment: Exceptions to [facility] Postural Control Postural Control: Within Functional Limits  Balance Balance Balance Assessed: Yes Static Sitting Balance Static Sitting - Balance Support: Bilateral upper extremity supported;Feet supported Static Sitting - Level of Assistance: 6: Modified independent (Device/Increase time) Dynamic Sitting Balance Sitting balance - Comments: sitting EOB. Able to weight shift onto left hip to remove bedding from under buttocks. Static Standing Balance Static Standing - Balance Support: Bilateral upper extremity supported;During functional activity Static Standing - Level of Assistance: 6: Modified independent (Device/Increase time) Dynamic Standing Balance Dynamic Standing - Balance Support: During functional activity;Bilateral upper extremity supported Dynamic Standing - Level of Assistance: 6: Modified independent (Device/Increase time) Dynamic Standing - Balance Activities: Forward lean/weight  shifting Extremity/Trunk Assessment RUE Assessment RUE Assessment: Within Functional Limits   OT Intervention and Training:  Pt seen for final skilled OT session. Pt reported very low to no pain 1/10 max in R hip only. Pt eager for discharge and actively preparing toward mod I level. Pt requested full shower training this session with toileting, LB dressing and grooming as well as progression to mod I in room. Pt had RW delivered and OT set for height. Pt was able to demo status above for mod I for all in room activity performed this session. OT completed safety training in relation to managing RW, bed tray, w/c to be mod I to access bathroom after consulting with PT on team who is in agreement. Pt has met all goals and is now d/c'd from CIR OT and rec HHOT upon discharge.    Barnabas Lister 12/12/2022, 12:09 PM

## 2022-12-12 NOTE — Progress Notes (Signed)
PROGRESS NOTE   Subjective/Complaints:  Pt ready to increase Senna and Colace-  Hard ball stools  Calluses real TTP- not open.   Ready to reduce tylenol to 500 mg q4 hours  ROS:   Pt denies SOB, abd pain, CP, N/V/ (+)C/D, and vision changes   Objective:   No results found. No results for input(s): "WBC", "HGB", "HCT", "PLT" in the last 72 hours.   No results for input(s): "NA", "K", "CL", "CO2", "GLUCOSE", "BUN", "CREATININE", "CALCIUM" in the last 72 hours.    Intake/Output Summary (Last 24 hours) at 12/12/2022 0853 Last data filed at 12/12/2022 0753 Gross per 24 hour  Intake 600 ml  Output --  Net 600 ml        Physical Exam: Vital Signs Blood pressure 121/71, pulse 70, temperature (!) 97.5 F (36.4 C), resp. rate 16, height '5\' 6"'$  (1.676 m), weight 113.6 kg, SpO2 95 %.       General: awake, alert, appropriate,  sitting up in bed; NAD HENT: conjugate gaze; oropharynx moist CV: regular rate; no JVD Pulmonary: CTA B/L; no W/R/R- good air movement GI: soft, NT, ND, (+)BS- hypoactive Psychiatric: appropriate Neurological: Ox3  Skin:  Sutures out- looks fantastic  RLE incision have sutures- not rechecked today, no drainage or erythema surrounding it, Appropriately tender to palpation- today C/D/I covered with bandage, not removed during exam  MSK:      Mild swelling around R thigh with some dark bruising discoloration--improving      Moderate hip pain with attempted ROM of both the L and R thighs      Strength:                RUE: 5/5 SA, 5/5 EF, 5/5 EE, 5/5 WE, 5/5 FF, 5/5 FA                 LUE: 5/5 SA, 5/5 EF, 5/5 EE, 5/5 WE, 5/5 FF, 5/5 FA                 RLE: 2-/5 HF, 2+/5 KE, 5/5 DF, 5/5 EHL, 5/5 PF                 LLE:  2+/5 HF, 3+/5 KE, 5/5 DF, 5/5 EHL, 5/5 PF    Neurologic exam: Sitting up in bed.  Makes eye contact with examiner.  Follows full commands. Cognition: AAO to person, place,  time and event.  Language: Fluent, No substitutions or neoglisms. No dysarthria. Names 3/3 objects correctly.  Memory: Recalls 3/3 objects at 5 minutes. No apparent deficits  Insight: Good insight into current condition.  Mood: Pleasant affect, appropriate mood.  Sensation: To light touch reduced in bilateral feet and ankles Reflexes: 2+ in BL UE and LEs. Negative Hoffman's and babinski signs bilaterally.  CN: 2-12 grossly intact.  Coordination: No apparent tremors.  Spasticity: MAS 0 in all extremities.       Assessment/Plan: 1. Functional deficits which require 3+ hours per day of interdisciplinary therapy in a comprehensive inpatient rehab setting. Physiatrist is providing close team supervision and 24 hour management of active medical problems listed below. Physiatrist and rehab team continue to assess barriers to discharge/monitor  patient progress toward functional and medical goals  Care Tool:  Bathing    Body parts bathed by patient: Right arm, Left arm, Chest, Abdomen, Front perineal area, Right upper leg, Left upper leg, Face, Right lower leg, Left lower leg, Buttocks   Body parts bathed by helper: Buttocks     Bathing assist Assist Level: Supervision/Verbal cueing     Upper Body Dressing/Undressing Upper body dressing   What is the patient wearing?: Pull over shirt    Upper body assist Assist Level: Independent    Lower Body Dressing/Undressing Lower body dressing      What is the patient wearing?: Underwear/pull up, Pants     Lower body assist Assist for lower body dressing: Supervision/Verbal cueing     Toileting Toileting    Toileting assist Assist for toileting: Minimal Assistance - Patient > 75%     Transfers Chair/bed transfer  Transfers assist     Chair/bed transfer assist level: Independent with assistive device Chair/bed transfer assistive device: Programmer, multimedia   Ambulation assist   Ambulation activity did not  occur: Safety/medical concerns (limited by pain/activity tolerance)  Assist level: Independent with assistive device Assistive device: Walker-rolling Max distance: > 500 ft   Walk 10 feet activity   Assist  Walk 10 feet activity did not occur: Safety/medical concerns (limited by pain/activity tolerance)  Assist level: Independent with assistive device Assistive device: Walker-rolling   Walk 50 feet activity   Assist Walk 50 feet with 2 turns activity did not occur: Safety/medical concerns (limited by pain/activity tolerance)  Assist level: Independent with assistive device Assistive device: Walker-rolling    Walk 150 feet activity   Assist Walk 150 feet activity did not occur: Safety/medical concerns (limited by pain/activity tolerance)  Assist level: Independent with assistive device Assistive device: Walker-rolling    Walk 10 feet on uneven surface  activity   Assist Walk 10 feet on uneven surfaces activity did not occur: Safety/medical concerns (limited by pain/activity tolerance)   Assist level: Independent with assistive device Assistive device: Walker-rolling   Wheelchair     Assist Is the patient using a wheelchair?: No Type of Wheelchair: Manual Wheelchair activity did not occur: N/A         Wheelchair 50 feet with 2 turns activity    Assist    Wheelchair 50 feet with 2 turns activity did not occur: N/A       Wheelchair 150 feet activity     Assist  Wheelchair 150 feet activity did not occur: N/A       Blood pressure 121/71, pulse 70, temperature (!) 97.5 F (36.4 C), resp. rate 16, height '5\' 6"'$  (1.676 m), weight 113.6 kg, SpO2 95 %.  Medical Problem List and Plan: 1. Functional deficits secondary to right comminuted subtrochanteric proximal femur fracture.  Status post intramedullary nailing of right hip 11/26/2022 (Dr. Marcelino Scot).  Weightbearing as tolerated with walker, unrestricted ROM R hip/knee, f/up outpatient with ortho in  osteoporosis clinic             -patient may shower             -ELOS/Goals: 10-14 days, SPV PT/OT  -D/c date 12/13/22 Con't CIR- PT and OT- d/c tomorrow with sister- between 9-11 2.  Antithrombotics: -DVT/anticoagulation:  Pharmaceutical: Eliquis 2.'5mg'$  BID - ppx for 30 days post-op (11/26/22-12/26/22).   -12/01/22 Dopplers negative for DVT  1/25- going home on Eliquis til 12/26/22 -antiplatelet therapy: N/A 3. Pain Management: Tylenol 325-'650mg'$  q6h PRN, Oxycodone  5-'10mg'$  q4h PRN and Robaxin '500mg'$  q6h PRN -Chronic chemotherapy induced peripheral neuropathy - stable, no additional medications req -1/17- pain tolerable, but wanted to switch Oxy to Norco- we discussed would have to reduce amount of tylenol she gets since has tylenol in it- will wait til tomorrow to reassess -1/18- pain worse this AM, but due for meds- will see how it goes -1/19- pt doesn't want to decrease Tylenol until next Mon/Tuesday- I explained that's fine, but will monitor CMP closely.  -12/07/22 pain controlled, continue to monitor; ordered weekly labs (CMP/CBC) starting 12/09/22 1/22- ALT/AST looks good- con't tylenol- and con't Oxy prn 1/23- will let pt go til tomorrow-then will decrease to 500 mg q4 hours tomorrow- wait to change to Norco. Add voltaren gel for R knee pain.   1/24- will decrease tylenol to 500 mg q4 hours starting tomorrow-pI'm concerned about 4G/day for so long-   1/25- decrease tylenol to 500 mg q4 hours  4. Mood/Behavior/Sleep: Provide emotional support -antipsychotic agents: N/A 5. Neuropsych/cognition: This patient is capable of making decisions on her own behalf. 6. Skin/Wound Care: Routine skin checks -11/30/22 Spoke with Ainsley Spinner PA-C for OTS, states can have daily dressing changes per nursing staff here, ok to shower and clean wounds with soap and water--ordered -12/01/22 made nursing aware of dressing change order daily   -12/07/22 continue daily dressing, reminded nursing of DAILY changes  1/24-  sutures out  1/25- looks fantastic 7. Fluids/Electrolytes/Nutrition: Routine in and outs with follow-up chemistries (weekly labs next 12/09/22)  -Continue Vitamin C '1000mg'$  QD and vitamin D 2000IU BID -11/30/22 Monitor SCr, last value 1.06 on 11/28/22, encourage fluids and monitor -12/02/22 Cr 0.8 and Bun 24 stable, continue to monitor on weekly labs (12/09/22)  1/22- Cr/BUN well controlled- con't to monitor 8.  Acute blood loss anemia.  Hgb 10.6 on 11/28/22. Follow-up CBC on weekly labs starting 12/02/22 -1/15 HGB up to 12.0  -1/19- labs weekly, ordered for 12/09/22  1/22- Hb is stable at 12.0 9.  Morbid obesity.  BMI 38.94.  Dietary follow-up 10.  History of right breast cancer.  Follow-up outpatient. 11.  Constipation:  Colace BID, MiraLAX 17g QD PRN  -Per patient, LBM prior to admission during incident -Increase regimen to Sennakot-S 1 tab BID + miralax 17g QD, PRN MoM -11/30/22 doesn't feel constipated, but no BM since date of incident (11/24/22); added back Miralax 17g PRN in addition to QD dosing, added Fleet enema QD PRN and Sorbitol 70% 30-29m QD PRN; monitor -12/07/22 LBM 12/05/22 but doesn't feel constipated, wants to stick with current regimen for now (Senokot 2 tabs QD, MoM 178mQD PRN, Miralax 17g QD PRN, Sorbitol 70% 30-6042mD PRN) 1/24- will wait to increase Senna- but pt admits not "going enough"- but doesn't want lage BM interfering with therapy.  1/25- will add Senna 1 pill today and add Colace 100 mg daily due to hard balls/stools.  12. LE edema: noted 12/07/22 PM by patient but was upright in chair for a lot of the day, eval today 12/08/22 shows 1-2+ RLE edema up to mid calf, no significant tenderness or warmth, doubt DVT, suspect purely dependent edema.  -Monitor for changes, encouraged elevation of extremity, compression socks; if worsening, could consider mild diuretic but doubt this will be necessary; repeat dopplers if s/sx of DVT occur, but doubtful.   1/22- Elevating and is doing  better-con't to treat- use TEDs   1/23- much better- con't to elevate as needed  1/24- wearing TEDs- very  helpful  I spent a total of 35   minutes on total care today- >50% coordination of care- due to  D/w PA as well as nursing about meds changes- d/w pt about reducing tylenol and increasing bowel meds.   LOS: 13 days A FACE TO FACE EVALUATION WAS PERFORMED  Jhoanna Heyde 12/12/2022, 8:53 AM

## 2022-12-12 NOTE — Progress Notes (Signed)
Recreational Therapy Session Note  Patient Details  Name: Terri Mayer MRN: [id number] Date of Birth: 12/13/1950 Today's Date: 12/12/2022  Pain: no c/o Skilled Therapeutic Interventions/Progress Updates:Session focused on pt education in regards to use of leisure time once home, activity modifications, and community reintegration.  Pt anxious for upcoming discharge and has already began planning for use of her time once home and community reintegration.Therapy/Group: Individual Therapy   Jakeline Dave 12/12/2022, 4:04 PM

## 2022-12-12 NOTE — Progress Notes (Signed)
Inpatient Rehabilitation Care Coordinator Discharge Note   Patient Details  Name: Terri Mayer MRN: [id number] Date of Birth: 10-Mar-1951   Discharge location: HOME WITH SISTER WHO IS COMING FROM [place] TO ASSIST WITH HER CARE AT HOME  Length of Stay: 14 DAYS  Discharge activity level: SUPERVISION-MOD/I LEVEL  Home/community participation: ACTIVE  Patient response AU:QJFHLK Literacy - How often do you need to have someone help you when you read instructions, pamphlets, or other written material from your doctor or pharmacy?: Never  Patient response TG:YBWLSL Isolation - How often do you feel lonely or isolated from those around you?: Never  Services provided included: MD, RD, PT, OT, RN, CM, TR, Pharmacy, SW  Financial Services:  Charity fundraiser Utilized: [place] offered to/list presented to: PT  Follow-up services arranged:  Home Health, Patient/Family has no preference for HH/DME agencies, DME Home Health Agency: [facility]    DME : [facility] AND [facility]    Patient response to transportation need: Is the patient able to respond to transportation needs?: Yes In the past 12 months, has lack of transportation kept you from medical appointments or from getting medications?: No In the past 12 months, has lack of transportation kept you from meetings, work, or from getting things needed for daily living?: No    Comments (or additional information): PT DID WELL AND REACHED HER GOALS SISTER WILL BE HERE DAY OF DISCHARGE, PT CAN DIRECT HER CARE. FEELS [place].  Patient/Family verbalized understanding of follow-up arrangements:  Yes  Individual responsible for coordination of the follow-up plan: SELF AND KJAREN-SISTER [phone]  Confirmed correct DME delivered: Elease Hashimoto 12/12/2022    Oluwaseun Cremer, Gardiner Rhyme

## 2022-12-12 NOTE — Progress Notes (Signed)
Physical Therapy Discharge Summary  Patient Details  Name: Terri Mayer MRN: [id number] Date of Birth: [birth year]  Date of Discharge from PT service:December 12, 2022  Today's Date: 12/12/2022 PT Individual Time: 0800-0900 PT Individual Time Calculation (min): 60 min    Patient has met 7 of 7 long term goals due to improved activity tolerance, improved balance, improved postural control, increased strength, increased range of motion, decreased pain, ability to compensate for deficits, and improved coordination.  Patient to discharge at an ambulatory level Modified Independent.     Reasons goals not met: N/A   Recommendation:  Patient will benefit from ongoing skilled PT services in home health setting to continue to advance safe functional mobility, address ongoing impairments in ROM, strength, coordination, pain, and minimize fall risk.  Equipment: RW  Reasons for discharge: treatment goals met and discharge from hospital  Patient/family agrees with progress made and goals achieved: Yes  PT Discharge  Pain Interference Pain Interference Pain Effect on Sleep: 1. Rarely or not at all Pain Interference with Therapy Activities: 1. Rarely or not at all Pain Interference with Day-to-Day Activities: 1. Rarely or not at all Cognition Overall Cognitive Status: Within Functional Limits for tasks assessed Arousal/Alertness: Awake/alert Orientation Level: Oriented X4 Memory: Appears intact Awareness: Appears intact Problem Solving: Appears intact Safety/Judgment: Appears intact Sensation Sensation Light Touch: Appears Intact Proprioception: Impaired Detail Additional Comments: Bilateral neuropathy in feet due to chemo. Coordination Gross Motor Movements are Fluid and Coordinated: Yes Fine Motor Movements are Fluid and Coordinated: Yes Coordination and Movement Description: grossly coordinated and improved strength Finger Nose Finger Test: [facility] Heel Shin Test: limited R LE  due to ROM & strength deficits, pain Motor  Motor Motor: Within Functional Limits Motor - Skilled Clinical Observations: grossly coordinated and improved strength  Mobility Bed Mobility Bed Mobility: Rolling Right;Rolling Left;Sit to Supine;Supine to Sit Rolling Right: Independent with assistive device Rolling Left: Independent with assistive device Supine to Sit: Independent with assistive device Sit to Supine: Independent with assistive device Transfers Transfers: Sit to Stand;Stand to Sit;Stand Pivot Transfers Sit to Stand: Independent with assistive device Stand to Sit: Independent with assistive device Stand Pivot Transfers: Independent with assistive device Transfer (Assistive device): Rolling walker Locomotion  Gait Ambulation: Yes Gait Assistance: Independent with assistive device Gait Distance (Feet): 500 Feet Assistive device: Rolling walker Gait Gait: Yes Gait Pattern: Impaired Gait Pattern: Decreased stance time - right Gait velocity: Decreased Stairs / Additional Locomotion Stairs: Yes Stairs Assistance: Independent with assistive device Stair Management Technique: Two rails Number of Stairs: 12 Height of Stairs: 6 (inches) Ramp: Independent with assistive device Curb: Independent with assistive device Pick up small object from the floor assist level: Minimal Assistance - Patient > 75% Pick up small object from the floor assistive device: RW Wheelchair Mobility Wheelchair Mobility: No  Trunk/Postural Assessment  Cervical Assessment Cervical Assessment: Exceptions to [facility] (forward head) Thoracic Assessment Thoracic Assessment: Exceptions to [facility] (rounded shoulders) Lumbar Assessment Lumbar Assessment: Exceptions to [facility] (posterior pelvic tilt) Postural Control Postural Control: Within Functional Limits  Balance Balance Balance Assessed: Yes Static Sitting Balance Static Sitting - Balance Support: Bilateral upper extremity supported;Feet supported Static  Sitting - Level of Assistance: 6: Modified independent (Device/Increase time) Static Standing Balance Static Standing - Balance Support: Bilateral upper extremity supported;During functional activity Static Standing - Level of Assistance: 6: Modified independent (Device/Increase time) Dynamic Standing Balance Dynamic Standing - Balance Support: During functional activity;Bilateral upper extremity supported Dynamic Standing - Level of Assistance: 6: Modified  independent (Device/Increase time) Extremity Assessment  RLE Assessment RLE Assessment: Within Functional Limits General Strength Comments: grossly 4/5 RLE Strength RLE Overall Strength: Within Functional Limits for tasks assessed LLE Assessment LLE Assessment: Within Functional Limits General Strength Comments: grossly 4+/5   Treatment Session 1:  Pt agreeable to PT and reports 2/10 pain, pre-medicated. PT dependently donned ted hose for time management and pt mod I for lower body dressing, bed mobility, sit to stand with RW and ambulation to toilet.   PT assessed pain interference, sensation, coordination, strength and balance in preparation for discharge.    6 Min Walk Test:  Instructed patient to ambulate as quickly and as safely as possible for 6 minutes using LRAD. Patient was allowed to take standing rest breaks without stopping the test, but if the patient required a sitting rest break the clock would be stopped and the test would be over.  Results: 455 feet (139 meters, Avg speed 0.39 m/s) using a RW. Results indicate that the patient has reduced endurance with ambulation compared to age matched norms.  Age Matched Norms: 10-69 yo M: 7 F: 33, 9-79 yo M: 43 F: 471, 39-89 yo M: 417 F: 392 MDC: 58.21 meters (190.98 feet) or 50 meters (ANPTA Core Set of Outcome Measures for Adults with Neurologic Conditions, 2018)  Pt ambulated >500 ft with RW mod I in session. Pt left seated in w/c at bedside with all needs in reach.    Treatment Session 2:   Pt agreeable to PT session and mod I with all mobility in session including toileting with RW. Pt performed ten meter walk test and meets criteria for limited community ambulator, see results below.   Pt educated on results and engaged in discharge planning and PT provided recommendations for bolster for LE positioning and exercises.   Pt attempted sit to stand with no UE support and unsuccessful. PT educated pt to continue working on transfers with HHPT following discharge.   Pt ambulated > 300 ft throughout session with RW and left semi-reclined in bed with all needs in reach..   10 Meter Walk Test: Patient instructed to walk 10 meters (32.8 ft) as quickly and as safely as possible at their normal speed x2 and at a fast speed x2. Time measured from 2 meter mark to 8 meter mark to accommodate ramp-up and ramp-down.  Normal speed 1: 0.34 m/s Normal speed 2: 0.34 m/s Average Normal speed: 0.34 m/s Fast speed 1: 0.55 m/s Fast speed 2: 0.51 m/s Average Fast speed: 0.53 m/s Cut off scores: <0.4 m/s = household Ambulator, 0.4-0.8 m/s = limited community Ambulator, >0.8 m/s = community Ambulator, >1.2 m/s = crossing a street, <1.0 = increased fall risk MCID 0.05 m/s (small), 0.13 m/s (moderate), 0.06 m/s (significant)  (ANPTA Core Set of Outcome Measures for Adults with Neurologic Conditions, 2018)    Verl Dicker Verl Dicker PT, DPT  12/12/2022, 8:28 AM

## 2022-12-12 NOTE — Progress Notes (Signed)
Patient ID: Terri Mayer, female   DOB: [birth year], 72 y.o.   MRN: [id number] [employer] pt handicapped application to take to [facility] or [employer] place to get temporary handicapped tag

## 2022-12-12 NOTE — Plan of Care (Signed)
  Problem: RH BOWEL ELIMINATION Goal: RH STG MANAGE BOWEL WITH ASSISTANCE Description: STG Manage Bowel with min Assistance. Outcome: Progressing Goal: RH STG MANAGE BOWEL W/MEDICATION W/ASSISTANCE Description: STG Manage Bowel with Medication with min Assistance. Outcome: Progressing   Problem: RH SKIN INTEGRITY Goal: RH STG SKIN FREE OF INFECTION/BREAKDOWN Description: Skin will be free of any infection/breakdown with min assist Outcome: Progressing Goal: RH STG ABLE TO PERFORM INCISION/WOUND CARE W/ASSISTANCE Description: STG Able To Perform Incision/Wound Care With min Assistance. Outcome: Progressing   Problem: RH SAFETY Goal: RH STG ADHERE TO SAFETY PRECAUTIONS W/ASSISTANCE/DEVICE Description: STG Adhere to Safety Precautions With min Assistance/Device. Outcome: Progressing

## 2022-12-13 NOTE — Progress Notes (Signed)
Completed nursing care plan and nursing education for discharge to home.

## 2022-12-13 NOTE — Progress Notes (Signed)
Recreational Therapy Discharge Summary Patient Details  Name: Velta Rockholt MRN: [id number] Date of Birth: 28-Jan-1951 Today's Date: 12/13/2022   Comments on progress toward goals: Pt is scheduled for discharge home today at Mod I level.  TR sessions focused pt education in regards to activity analysis identifying adaptations, coping strategies and community reintegration.  Pt also participated in animals assisted activities during LOS.  Pt is anxious to return home and to previously enjoyed activities as able.  Reasons for discharge: discharge from hospital  Follow-up: [place] agrees with progress made and goals achieved: Yes  Hayleigh Bawa 12/13/2022, 8:42 AM

## 2022-12-13 NOTE — Progress Notes (Signed)
Inpatient Rehabilitation Discharge Medication Review by a Pharmacist  A complete drug regimen review was completed for this patient to identify any potential clinically significant medication issues.  High Risk Drug Classes Is patient taking? Indication by Medication  Antipsychotic No   Anticoagulant Yes Eliquis - VTE ppx  Antibiotic No   Opioid Yes Oxycodone - pain   Antiplatelet No   Hypoglycemics/insulin No   Vasoactive Medication No   Chemotherapy No   Other Yes Acetaminophen, diclonac topical gel - pain  Robaxin - muscle spasms Vit D3, Vit C, MVI- supplements  Senna/docusate - constipation     Type of Medication Issue Identified Description of Issue Recommendation(s)  Drug Interaction(s) (clinically significant)     Duplicate Therapy     Allergy     No Medication Administration End Date     Incorrect Dose     Additional Drug Therapy Needed     Significant med changes from prior encounter  Ibuprofen removed from PTA medications Communicate changes to patient/family at discharge  Other       Clinically significant medication issues were identified that warrant physician communication and completion of prescribed/recommended actions by midnight of the next day:  No  Pharmacist comments:  Eliquis - ortho VTE ppx x 30 days (through 12/26/22 and stop) Education completed 11/28/22 with patient  Time spent performing this drug regimen review (minutes):  15   Thank you for allowing pharmacy to be a part of this patient's care.  Ardyth Harps, PharmD Clinical Pharmacist

## 2022-12-13 NOTE — Progress Notes (Signed)
PROGRESS NOTE   Subjective/Complaints:  Pt reports being more constipated in last 24 hours- feeling more bloated.   Onion "cleans her out"- will try that at home- doesn't want more bowel meds here.   Also more uncomfortable since reduced tylenol- explained it's an OTC, but we discussed options to try and reduce tylenol, but help her pain- decided on 650 mg q5 hours- which brings tylenol dose close to 3Grams.  C/o pain in L bottom of foot from callus   ROS:   Pt denies SOB, abd pain, CP, N/V/ (+)C/D, and vision changes   Objective:   No results found. No results for input(s): "WBC", "HGB", "HCT", "PLT" in the last 72 hours.   No results for input(s): "NA", "K", "CL", "CO2", "GLUCOSE", "BUN", "CREATININE", "CALCIUM" in the last 72 hours.    Intake/Output Summary (Last 24 hours) at 12/13/2022 0838 Last data filed at 12/13/2022 0700 Gross per 24 hour  Intake 837 ml  Output --  Net 837 ml        Physical Exam: Vital Signs Blood pressure 112/69, pulse 72, temperature 97.6 F (36.4 C), temperature source Oral, resp. rate 18, height '5\' 6"'$  (1.676 m), weight 113.6 kg, SpO2 95 %.       General: awake, alert, appropriate, sitting up in bed; NAD HENT: conjugate gaze; oropharynx moist CV: regular rate; no JVD Pulmonary: CTA B/L; no W/R/R- good air movement GI: soft, NT, somewhat distended/bloated; normoactive BS Psychiatric: appropriate Neurological: Ox3  Skin: has small callus/corn with a divot in middle of it- doesn't have irritation/drainage- was covered with bandage Sutures out- looks fantastic  RLE incision have sutures- not rechecked today, no drainage or erythema surrounding it, Appropriately tender to palpation- today C/D/I covered with bandage, not removed during exam  MSK:      Mild swelling around R thigh with some dark bruising discoloration--improving      Moderate hip pain with attempted ROM of both  the L and R thighs      Strength:                RUE: 5/5 SA, 5/5 EF, 5/5 EE, 5/5 WE, 5/5 FF, 5/5 FA                 LUE: 5/5 SA, 5/5 EF, 5/5 EE, 5/5 WE, 5/5 FF, 5/5 FA                 RLE: 2-/5 HF, 2+/5 KE, 5/5 DF, 5/5 EHL, 5/5 PF                 LLE:  2+/5 HF, 3+/5 KE, 5/5 DF, 5/5 EHL, 5/5 PF    Neurologic exam: Sitting up in bed.  Makes eye contact with examiner.  Follows full commands. Cognition: AAO to person, place, time and event.  Language: Fluent, No substitutions or neoglisms. No dysarthria. Names 3/3 objects correctly.  Memory: Recalls 3/3 objects at 5 minutes. No apparent deficits  Insight: Good insight into current condition.  Mood: Pleasant affect, appropriate mood.  Sensation: To light touch reduced in bilateral feet and ankles Reflexes: 2+ in BL UE and LEs. Negative Hoffman's and babinski signs bilaterally.  CN: 2-12 grossly intact.  Coordination: No apparent tremors.  Spasticity: MAS 0 in all extremities.       Assessment/Plan: 1. Functional deficits which require 3+ hours per day of interdisciplinary therapy in a comprehensive inpatient rehab setting. Physiatrist is providing close team supervision and 24 hour management of active medical problems listed below. Physiatrist and rehab team continue to assess barriers to discharge/monitor patient progress toward functional and medical goals  Care Tool:  Bathing    Body parts bathed by patient: Right arm, Left arm, Chest, Abdomen, Front perineal area, Right upper leg, Left upper leg, Face, Right lower leg, Left lower leg, Buttocks   Body parts bathed by helper: Buttocks     Bathing assist Assist Level: Independent with assistive device     Upper Body Dressing/Undressing Upper body dressing   What is the patient wearing?: Pull over shirt    Upper body assist Assist Level: Independent    Lower Body Dressing/Undressing Lower body dressing      What is the patient wearing?: Underwear/pull up, Pants      Lower body assist Assist for lower body dressing: Independent with assitive device     Toileting Toileting    Toileting assist Assist for toileting: Minimal Assistance - Patient > 75%     Transfers Chair/bed transfer  Transfers assist     Chair/bed transfer assist level: Independent with assistive device Chair/bed transfer assistive device: Programmer, multimedia   Ambulation assist   Ambulation activity did not occur: Safety/medical concerns (limited by pain/activity tolerance)  Assist level: Independent with assistive device Assistive device: Walker-rolling Max distance: > 500 ft   Walk 10 feet activity   Assist  Walk 10 feet activity did not occur: Safety/medical concerns (limited by pain/activity tolerance)  Assist level: Independent with assistive device Assistive device: Walker-rolling   Walk 50 feet activity   Assist Walk 50 feet with 2 turns activity did not occur: Safety/medical concerns (limited by pain/activity tolerance)  Assist level: Independent with assistive device Assistive device: Walker-rolling    Walk 150 feet activity   Assist Walk 150 feet activity did not occur: Safety/medical concerns (limited by pain/activity tolerance)  Assist level: Independent with assistive device Assistive device: Walker-rolling    Walk 10 feet on uneven surface  activity   Assist Walk 10 feet on uneven surfaces activity did not occur: Safety/medical concerns (limited by pain/activity tolerance)   Assist level: Independent with assistive device Assistive device: Walker-rolling   Wheelchair     Assist Is the patient using a wheelchair?: No Type of Wheelchair: Manual Wheelchair activity did not occur: N/A         Wheelchair 50 feet with 2 turns activity    Assist    Wheelchair 50 feet with 2 turns activity did not occur: N/A       Wheelchair 150 feet activity     Assist  Wheelchair 150 feet activity did not occur:  N/A       Blood pressure 112/69, pulse 72, temperature 97.6 F (36.4 C), temperature source Oral, resp. rate 18, height '5\' 6"'$  (1.676 m), weight 113.6 kg, SpO2 95 %.  Medical Problem List and Plan: 1. Functional deficits secondary to right comminuted subtrochanteric proximal femur fracture.  Status post intramedullary nailing of right hip 11/26/2022 (Dr. Marcelino Scot).  Weightbearing as tolerated with walker, unrestricted ROM R hip/knee, f/up outpatient with ortho in osteoporosis clinic             -patient may shower             -  ELOS/Goals: 10-14 days, SPV PT/OT  -D/c date 12/13/22 D/c today Doesn't need f/u with me; just surgeon, but give her my office number as needed; can get pain meds from Ortho after 7 days.  2.  Antithrombotics: -DVT/anticoagulation:  Pharmaceutical: Eliquis 2.'5mg'$  BID - ppx for 30 days post-op (11/26/22-12/26/22).   -12/01/22 Dopplers negative for DVT  1/25- going home on Eliquis til 12/26/22 -antiplatelet therapy: N/A 3. Pain Management: Tylenol 325-'650mg'$  q6h PRN, Oxycodone 5-'10mg'$  q4h PRN and Robaxin '500mg'$  q6h PRN -Chronic chemotherapy induced peripheral neuropathy - stable, no additional medications req -1/17- pain tolerable, but wanted to switch Oxy to Norco- we discussed would have to reduce amount of tylenol she gets since has tylenol in it- will wait til tomorrow to reassess -1/18- pain worse this AM, but due for meds- will see how it goes -1/19- pt doesn't want to decrease Tylenol until next Mon/Tuesday- I explained that's fine, but will monitor CMP closely.  -12/07/22 pain controlled, continue to monitor; ordered weekly labs (CMP/CBC) starting 12/09/22 1/22- ALT/AST looks good- con't tylenol- and con't Oxy prn 1/23- will let pt go til tomorrow-then will decrease to 500 mg q4 hours tomorrow- wait to change to Norco. Add voltaren gel for R knee pain.   1/24- will decrease tylenol to 500 mg q4 hours starting tomorrow-pI'm concerned about 4G/day for so long-   1/25- decrease  tylenol to 500 mg q4 hours   1/26- can take tylenol 650 mg q 5 hours, not 4 hours- to reduce amount of tylenol.  4. Mood/Behavior/Sleep: Provide emotional support -antipsychotic agents: N/A 5. Neuropsych/cognition: This patient is capable of making decisions on her own behalf. 6. Skin/Wound Care: Routine skin checks -11/30/22 Spoke with Ainsley Spinner PA-C for OTS, states can have daily dressing changes per nursing staff here, ok to shower and clean wounds with soap and water--ordered -12/01/22 made nursing aware of dressing change order daily   -12/07/22 continue daily dressing, reminded nursing of DAILY changes  1/24- sutures out  1/25- looks fantastic  1/26- will get f/u with surgeon 7. Fluids/Electrolytes/Nutrition: Routine in and outs with follow-up chemistries (weekly labs next 12/09/22)  -Continue Vitamin C '1000mg'$  QD and vitamin D 2000IU BID -11/30/22 Monitor SCr, last value 1.06 on 11/28/22, encourage fluids and monitor -12/02/22 Cr 0.8 and Bun 24 stable, continue to monitor on weekly labs (12/09/22)  1/22- Cr/BUN well controlled- con't to monitor 8.  Acute blood loss anemia.  Hgb 10.6 on 11/28/22. Follow-up CBC on weekly labs starting 12/02/22 -1/15 HGB up to 12.0  -1/19- labs weekly, ordered for 12/09/22  1/22- Hb is stable at 12.0 9.  Morbid obesity.  BMI 38.94.  Dietary follow-up 10.  History of right breast cancer.  Follow-up outpatient. 11.  Constipation:  Colace BID, MiraLAX 17g QD PRN  -Per patient, LBM prior to admission during incident -Increase regimen to Sennakot-S 1 tab BID + miralax 17g QD, PRN MoM -11/30/22 doesn't feel constipated, but no BM since date of incident (11/24/22); added back Miralax 17g PRN in addition to QD dosing, added Fleet enema QD PRN and Sorbitol 70% 30-45m QD PRN; monitor -12/07/22 LBM 12/05/22 but doesn't feel constipated, wants to stick with current regimen for now (Senokot 2 tabs QD, MoM 138mQD PRN, Miralax 17g QD PRN, Sorbitol 70% 30-6085mD PRN) 1/24- will  wait to increase Senna- but pt admits not "going enough"- but doesn't want lage BM interfering with therapy.  1/25- will add Senna 1 pill today and add Colace 100 mg daily  due to hard balls/stools.   1/26- no BM except something small- feeling bloated- will con't Senna at home, but plans on eating onion "which cleans her out". Doesn't want any more bowel meds HERE.  12. LE edema: noted 12/07/22 PM by patient but was upright in chair for a lot of the day, eval today 12/08/22 shows 1-2+ RLE edema up to mid calf, no significant tenderness or warmth, doubt DVT, suspect purely dependent edema.  -Monitor for changes, encouraged elevation of extremity, compression socks; if worsening, could consider mild diuretic but doubt this will be necessary; repeat dopplers if s/sx of DVT occur, but doubtful.   1/22- Elevating and is doing better-con't to treat- use TEDs   1/23- much better- con't to elevate as needed  1/24- wearing TEDs- very helpful 13. Callus bottom of L foot  1/26- not irritated or draining- suggested foam dressing here, but once gets home, suggested corn pad- pt voiced understanding.   I spent a total of 32   minutes on total care today- >50% coordination of care- due to  Spent time speaking with pt about tylenol dosing, B/B and constipation- and d/c orders/plans- also educated pt on corn pad to help callus on L foot from stone bruise.   LOS: 14 days A FACE TO FACE EVALUATION WAS PERFORMED  Halim Surrette 12/13/2022, 8:38 AM

## 2022-12-16 ENCOUNTER — Telehealth: Payer: Self-pay | Admitting: Internal Medicine

## 2022-12-16 DIAGNOSIS — Z6839 Body mass index (BMI) 39.0-39.9, adult: Secondary | ICD-10-CM | POA: Diagnosis not present

## 2022-12-16 DIAGNOSIS — S72141D Displaced intertrochanteric fracture of right femur, subsequent encounter for closed fracture with routine healing: Secondary | ICD-10-CM | POA: Diagnosis not present

## 2022-12-16 DIAGNOSIS — Z9221 Personal history of antineoplastic chemotherapy: Secondary | ICD-10-CM | POA: Diagnosis not present

## 2022-12-16 DIAGNOSIS — K5900 Constipation, unspecified: Secondary | ICD-10-CM | POA: Diagnosis not present

## 2022-12-16 DIAGNOSIS — M1389 Other specified arthritis, multiple sites: Secondary | ICD-10-CM | POA: Diagnosis not present

## 2022-12-16 DIAGNOSIS — Z7901 Long term (current) use of anticoagulants: Secondary | ICD-10-CM | POA: Diagnosis not present

## 2022-12-16 DIAGNOSIS — W19XXXD Unspecified fall, subsequent encounter: Secondary | ICD-10-CM | POA: Diagnosis not present

## 2022-12-16 DIAGNOSIS — Z853 Personal history of malignant neoplasm of breast: Secondary | ICD-10-CM | POA: Diagnosis not present

## 2022-12-16 DIAGNOSIS — Z923 Personal history of irradiation: Secondary | ICD-10-CM | POA: Diagnosis not present

## 2022-12-16 NOTE — Telephone Encounter (Signed)
[place] Name: [facility] [place] Name: [place] Phone #: [phone] secure  Service Requested: Verbal orders PT and OT eval (examples: OT/PT/Skilled Nursing/Social Work/Speech Therapy/Wound Care)  Frequency of Visits: PT: 2x for 4 weeks and 1x a week for 2 weeks

## 2022-12-17 NOTE — Telephone Encounter (Signed)
Needs to set up hospital follow up or can have her surgeon supervise hh orders if follow up with me not needed.

## 2022-12-18 NOTE — Telephone Encounter (Signed)
Patient states she will call us back after speaking with her surgeon and let us know if she wants to do a follow up with Dr [name]

## 2022-12-20 DIAGNOSIS — Z853 Personal history of malignant neoplasm of breast: Secondary | ICD-10-CM | POA: Diagnosis not present

## 2022-12-20 DIAGNOSIS — Z6839 Body mass index (BMI) 39.0-39.9, adult: Secondary | ICD-10-CM | POA: Diagnosis not present

## 2022-12-20 DIAGNOSIS — Z923 Personal history of irradiation: Secondary | ICD-10-CM | POA: Diagnosis not present

## 2022-12-20 DIAGNOSIS — S72141D Displaced intertrochanteric fracture of right femur, subsequent encounter for closed fracture with routine healing: Secondary | ICD-10-CM | POA: Diagnosis not present

## 2022-12-20 DIAGNOSIS — Z9221 Personal history of antineoplastic chemotherapy: Secondary | ICD-10-CM | POA: Diagnosis not present

## 2022-12-20 DIAGNOSIS — W19XXXD Unspecified fall, subsequent encounter: Secondary | ICD-10-CM | POA: Diagnosis not present

## 2022-12-20 DIAGNOSIS — K5900 Constipation, unspecified: Secondary | ICD-10-CM | POA: Diagnosis not present

## 2022-12-20 DIAGNOSIS — Z7901 Long term (current) use of anticoagulants: Secondary | ICD-10-CM | POA: Diagnosis not present

## 2022-12-20 DIAGNOSIS — M1389 Other specified arthritis, multiple sites: Secondary | ICD-10-CM | POA: Diagnosis not present

## 2022-12-25 DIAGNOSIS — S72301D Unspecified fracture of shaft of right femur, subsequent encounter for closed fracture with routine healing: Secondary | ICD-10-CM | POA: Diagnosis not present

## 2023-01-01 ENCOUNTER — Ambulatory Visit (INDEPENDENT_AMBULATORY_CARE_PROVIDER_SITE_OTHER): Payer: PPO | Admitting: Family Medicine

## 2023-01-01 ENCOUNTER — Encounter: Payer: Self-pay | Admitting: Family Medicine

## 2023-01-01 VITALS — BP 132/80 | Ht 66.0 in | Wt 225.0 lb

## 2023-01-01 DIAGNOSIS — Z6836 Body mass index (BMI) 36.0-36.9, adult: Secondary | ICD-10-CM | POA: Diagnosis not present

## 2023-01-01 DIAGNOSIS — E559 Vitamin D deficiency, unspecified: Secondary | ICD-10-CM | POA: Diagnosis not present

## 2023-01-01 DIAGNOSIS — M8000XA Age-related osteoporosis with current pathological fracture, unspecified site, initial encounter for fracture: Secondary | ICD-10-CM | POA: Diagnosis not present

## 2023-01-01 NOTE — Patient Instructions (Signed)
Nice to meet you Please alternate heat and ice on the hip  Please try ice on the knees  We'll call with the lab results  We'll get the bone density at the breast center   We'll run the benefits of the evenity Let me know about the physical therapy  Please send me a message in [place] with any questions or updates.  Please see me back to start the injections.   --Dr. Raeford Razor

## 2023-01-01 NOTE — Assessment & Plan Note (Signed)
Acute on chronic in nature.   - Check TSH

## 2023-01-01 NOTE — Assessment & Plan Note (Signed)
Check vitamin D. 

## 2023-01-01 NOTE — Progress Notes (Addendum)
  Terri Mayer - 72 y.o. female MRN [id number]  Date of birth: 1951/06/17  SUBJECTIVE:  Including CC & ROS.  No chief complaint on file.   Terri Mayer is a 72 y.o. female that is presenting with osteoporosis and right hip fracture.  She fell from standing height and had a trochanteric right hip fracture.  No history of tobacco use.  No significant family history of osteoporosis.  She does have a history significant for breast cancer and chemoradiation.  Currently is having home health physical therapy.  No long-term prednisone use.  Review of the discharge note from 1/26 shows she has a history of breast cancer status postlumpectomy with a partial right mastectomy with chemoradiation therapy and polyneuropathy.  She was admitted and underwent intramedullary nailing of the right hip on 1/9. Independent review of the bone density scan from 2021 shows a T-score of -1.5. Independent review of the CT pelvis without contrast on 1/8 shows a acute comminuted right femoral intratrochanteric fracture.  Review of Systems See HPI   HISTORY: Past Medical, Surgical, Social, and Family History Reviewed & Updated per EMR.   Pertinent Historical Findings include:  Past Medical History:  Diagnosis Date   Allergy    Breast cancer ([place])    History of chicken pox    Osteoarthritis    Neck,Back,Knees,Hips,Ankles   Personal history of chemotherapy    Personal history of radiation therapy     Past Surgical History:  Procedure Laterality Date   BREAST LUMPECTOMY Right 2017   DILATION AND CURETTAGE OF UTERUS     EYE SURGERY  at age 77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   INTRAMEDULLARY (IM) NAIL INTERTROCHANTERIC Right 11/26/2022   Procedure: INTRAMEDULLARY (IM) NAIL INTERTROCHANTERIC;  Surgeon: Altamese Marianne, MD;  Location: [facility];  Service: Orthopedics;  Laterality: Right;   PORTACATH PLACEMENT Right 12/11/2015   Procedure: INSERTION PORT-A-CATH WITH ULTRASOUND;  Surgeon: Rolm Bookbinder, MD;  Location:  [facility];  Service: General;  Laterality: Right;   RADIOACTIVE SEED GUIDED PARTIAL MASTECTOMY/AXILLARY SENTINEL NODE BIOPSY/AXILLARY NODE DISSECTION Right 05/02/2016   Procedure: RIGHT BREAST SEED GUIDED LUMPECTOMY AND RIGHT SEED TARGETED AXILLARY DISSECTION,AND SENTINEL NODE BIOPSY;  Surgeon: Rolm Bookbinder, MD;  Location: [facility];  Service: General;  Laterality: Right;  RIGHT BREAST SEED GUIDED LUMPECTOMY AND RIGHT SEED TARGETED AXILLARY DISSECTION,AND SENTINEL NODE BIOPSY   WISDOM TOOTH EXTRACTION       PHYSICAL EXAM:  VS: BP 132/80 (BP Location: Left Arm, Patient Position: Sitting)   Ht '5\' 6"'$  (1.676 m)   Wt 225 lb (102.1 kg)   BMI 36.32 kg/m  Physical Exam Gen: NAD, alert, cooperative with exam, well-appearing MSK:  Neurovascularly intact       ASSESSMENT & PLAN:   Age-related osteoporosis with current pathological fracture Acutely occurring with most recent pathologic hip fracture.  She does have a history of breast cancer with subsequent treatment. -Counseled on home exercise therapy and supportive care. -Magnesium, phosphorus, SPEP and IFE, PTH and calcium. -Bone density. -Pursue Evenity given her history of recent hip fracture   Morbid obesity ([place]) Acute on chronic in nature.   - Check TSH   Vitamin D deficiency Check vitamin D.

## 2023-01-01 NOTE — Assessment & Plan Note (Signed)
Acutely occurring with most recent pathologic hip fracture.  She does have a history of breast cancer with subsequent treatment. -Counseled on home exercise therapy and supportive care. -Magnesium, phosphorus, SPEP and IFE, PTH and calcium. -Bone density. -Pursue Evenity given her history of recent hip fracture

## 2023-01-02 NOTE — Addendum Note (Signed)
Addended by: Cresenciano Lick on: 01/02/2023 04:24 PM   Modules accepted: Orders

## 2023-01-03 ENCOUNTER — Encounter: Payer: Self-pay | Admitting: Family Medicine

## 2023-01-07 LAB — PROTEIN ELECTROPHORESIS, SERUM
A/G Ratio: 1.3 (ref 0.7–1.7)
Albumin ELP: 4 g/dL (ref 2.9–4.4)
Alpha 1: 0.3 g/dL (ref 0.0–0.4)
Alpha 2: 0.7 g/dL (ref 0.4–1.0)
Beta: 1 g/dL (ref 0.7–1.3)
Gamma Globulin: 1.1 g/dL (ref 0.4–1.8)
Globulin, Total: 3.1 g/dL (ref 2.2–3.9)
M-Spike, %: 0.3 g/dL — ABNORMAL HIGH

## 2023-01-07 LAB — PTH, INTACT AND CALCIUM
Calcium: 10.1 mg/dL (ref 8.7–10.3)
PTH: 35 pg/mL (ref 15–65)

## 2023-01-07 LAB — PHOSPHORUS: Phosphorus: 4.8 mg/dL — ABNORMAL HIGH (ref 3.0–4.3)

## 2023-01-07 LAB — IMMUNOFIXATION ELECTROPHORESIS
IgA/Immunoglobulin A, Serum: 233 mg/dL (ref 64–422)
IgG (Immunoglobin G), Serum: 1079 mg/dL (ref 586–1602)
IgM (Immunoglobulin M), Srm: 60 mg/dL (ref 26–217)
Total Protein: 7.1 g/dL (ref 6.0–8.5)

## 2023-01-07 LAB — MAGNESIUM: Magnesium: 2.1 mg/dL (ref 1.6–2.3)

## 2023-01-07 LAB — TSH: TSH: 1.86 u[IU]/mL (ref 0.450–4.500)

## 2023-01-07 LAB — VITAMIN D 25 HYDROXY (VIT D DEFICIENCY, FRACTURES): Vit D, 25-Hydroxy: 38.4 ng/mL (ref 30.0–100.0)

## 2023-01-09 ENCOUNTER — Ambulatory Visit
Admission: RE | Admit: 2023-01-09 | Discharge: 2023-01-09 | Disposition: A | Discharge status: Home / Self Care | Payer: PPO | Source: Ambulatory Visit | Attending: Family Medicine | Admitting: Family Medicine

## 2023-01-09 DIAGNOSIS — Z780 Asymptomatic menopausal state: Secondary | ICD-10-CM | POA: Diagnosis not present

## 2023-01-09 DIAGNOSIS — M85852 Other specified disorders of bone density and structure, left thigh: Secondary | ICD-10-CM | POA: Diagnosis not present

## 2023-01-09 DIAGNOSIS — M8000XA Age-related osteoporosis with current pathological fracture, unspecified site, initial encounter for fracture: Secondary | ICD-10-CM

## 2023-01-10 ENCOUNTER — Other Ambulatory Visit: Payer: PPO

## 2023-01-10 ENCOUNTER — Telehealth: Payer: Self-pay | Admitting: Family Medicine

## 2023-01-10 DIAGNOSIS — D472 Monoclonal gammopathy: Secondary | ICD-10-CM

## 2023-01-10 NOTE — Telephone Encounter (Signed)
Informed of results. Will have evaluation by hematologist.   Rosemarie Ax, MD [facility] 01/10/2023, 9:13 AM

## 2023-01-13 ENCOUNTER — Ambulatory Visit (INDEPENDENT_AMBULATORY_CARE_PROVIDER_SITE_OTHER): Payer: PPO | Admitting: Family Medicine

## 2023-01-13 ENCOUNTER — Encounter: Payer: Self-pay | Admitting: Family Medicine

## 2023-01-13 VITALS — BP 112/64 | HR 60 | Temp 98.2°F | Resp 20 | Ht 66.0 in | Wt 221.0 lb

## 2023-01-13 DIAGNOSIS — H43811 Vitreous degeneration, right eye: Secondary | ICD-10-CM | POA: Diagnosis not present

## 2023-01-13 DIAGNOSIS — H04123 Dry eye syndrome of bilateral lacrimal glands: Secondary | ICD-10-CM | POA: Diagnosis not present

## 2023-01-13 DIAGNOSIS — J069 Acute upper respiratory infection, unspecified: Secondary | ICD-10-CM | POA: Diagnosis not present

## 2023-01-13 DIAGNOSIS — H35373 Puckering of macula, bilateral: Secondary | ICD-10-CM | POA: Diagnosis not present

## 2023-01-13 DIAGNOSIS — R051 Acute cough: Secondary | ICD-10-CM | POA: Diagnosis not present

## 2023-01-13 DIAGNOSIS — H53022 Refractive amblyopia, left eye: Secondary | ICD-10-CM | POA: Diagnosis not present

## 2023-01-13 DIAGNOSIS — H25813 Combined forms of age-related cataract, bilateral: Secondary | ICD-10-CM | POA: Diagnosis not present

## 2023-01-13 LAB — POC COVID19 BINAXNOW: SARS Coronavirus 2 Ag: NEGATIVE

## 2023-01-13 LAB — POCT RAPID STREP A (OFFICE): Rapid Strep A Screen: NEGATIVE

## 2023-01-13 NOTE — Patient Instructions (Addendum)
Throat lozenges, chloraseptic spray, warm salt water gargles, hot tea/honey, cough syrup (Delsym), Tylenol, Mucinex, Vicks, and a humidifier at night.   Make sure to schedule an appointment with a PCP.

## 2023-01-13 NOTE — Progress Notes (Signed)
Assessment & Plan:  1. Viral URI Education provided on viral URIs. Discussed typical duration and progression of viral URIs. Encouraged symptom management including throat lozenges, chloraseptic spray, warm salt water gargles, hot tea/honey, cough syrup (Delsym), Tylenol, Mucinex, Vicks, and a humidifier at night.   2. Acute cough - POC COVID-19 BinaxNow - POCT rapid strep A  Results for orders placed or performed in visit on 01/13/23  POC COVID-19 BinaxNow  Result Value Ref Range   SARS Coronavirus 2 Ag Negative Negative  POCT rapid strep A  Result Value Ref Range   Rapid Strep A Screen Negative Negative    Follow up plan: Return if symptoms worsen or fail to improve.  Hendricks Limes, MSN, APRN, FNP-C  Subjective:  HPI: Terri Mayer is a 72 y.o. female presenting on 01/13/2023 for URI (Cough, congestion, ST, dry mouth - started Friday /) and cyst on back (Look at and make sure it is ok )  Patient is accompanied by her sister-in-law, who she is okay with being present. She complains of cough, head/chest congestion, sneezing, sore throat, and postnasal drainage. She denies headache, runny nose, fever, shortness of breath, and wheezing. Onset of symptoms was 3 days ago, gradually worsening since that time. She is drinking moderate amounts of fluids. Evaluation to date: none. Treatment to date: none. She does not smoke.   Patient also reports a cyst on her back that she would like looked at.     ROS: Negative unless specifically indicated above in HPI.   Relevant past medical history reviewed and updated as indicated.   Allergies and medications reviewed and updated.   Current Outpatient Medications:    acetaminophen (TYLENOL) 325 MG tablet, Take 2 tablets (650 mg total) by mouth every 4 (four) hours., Disp: , Rfl:    ascorbic acid (VITAMIN C) 1000 MG tablet, Take 1 tablet (1,000 mg total) by mouth daily., Disp: 30 tablet, Rfl: 0   diclofenac Sodium (VOLTAREN) 1 % GEL,  Apply 4 g topically 4 (four) times daily., Disp: 400 g, Rfl: 0   methocarbamol (ROBAXIN) 500 MG tablet, Take 1 tablet (500 mg total) by mouth every 6 (six) hours as needed for muscle spasms., Disp: 60 tablet, Rfl: 0   Multiple Vitamin (MULTIVITAMIN) tablet, Take 1 tablet by mouth daily., Disp: , Rfl:    vitamin D3 (CHOLECALCIFEROL) 25 MCG tablet, Take 2 tablets (2,000 Units total) by mouth 2 (two) times daily., Disp: 60 tablet, Rfl: 0  Allergies  Allergen Reactions   Codeine Nausea And Vomiting    dizziness    Objective:   BP 112/64   Pulse 60   Temp 98.2 F (36.8 C)   Resp 20   Ht '5\' 6"'$  (1.676 m)   Wt 221 lb (100.2 kg)   BMI 35.67 kg/m    Physical Exam Vitals reviewed.  Constitutional:      General: She is not in acute distress.    Appearance: Normal appearance. She is not ill-appearing, toxic-appearing or diaphoretic.  HENT:     Head: Normocephalic and atraumatic.     Right Ear: Tympanic membrane, ear canal and external ear normal. There is no impacted cerumen.     Left Ear: Tympanic membrane, ear canal and external ear normal. There is no impacted cerumen.     Nose: Nose normal. No congestion or rhinorrhea.     Right Sinus: No maxillary sinus tenderness or frontal sinus tenderness.     Left Sinus: No maxillary sinus tenderness or frontal  sinus tenderness.     Mouth/Throat:     Mouth: Mucous membranes are moist.     Pharynx: Oropharynx is clear. Posterior oropharyngeal erythema present. No oropharyngeal exudate.  Eyes:     General: No scleral icterus.       Right eye: No discharge.        Left eye: No discharge.     Conjunctiva/sclera: Conjunctivae normal.  Cardiovascular:     Rate and Rhythm: Normal rate and regular rhythm.     Heart sounds: Normal heart sounds. No murmur heard.    No friction rub. No gallop.  Pulmonary:     Effort: Pulmonary effort is normal. No respiratory distress.     Breath sounds: Normal breath sounds. No stridor. No wheezing, rhonchi or  rales.  Musculoskeletal:        General: Normal range of motion.     Cervical back: Normal range of motion.  Lymphadenopathy:     Cervical: No cervical adenopathy.  Skin:    General: Skin is warm and dry.     Capillary Refill: Capillary refill takes less than 2 seconds.  Neurological:     General: No focal deficit present.     Mental Status: She is alert and oriented to person, place, and time. Mental status is at baseline.  Psychiatric:        Mood and Affect: Mood normal.        Behavior: Behavior normal.        Thought Content: Thought content normal.        Judgment: Judgment normal.

## 2023-01-16 ENCOUNTER — Inpatient Hospital Stay (HOSPITAL_BASED_OUTPATIENT_CLINIC_OR_DEPARTMENT_OTHER): Payer: PPO | Admitting: Hematology & Oncology

## 2023-01-16 ENCOUNTER — Encounter: Payer: Self-pay | Admitting: Hematology & Oncology

## 2023-01-16 ENCOUNTER — Inpatient Hospital Stay: Payer: PPO | Attending: Hematology & Oncology

## 2023-01-16 VITALS — BP 112/65 | HR 117 | Temp 99.4°F | Ht 65.0 in | Wt 218.0 lb

## 2023-01-16 DIAGNOSIS — Z171 Estrogen receptor negative status [ER-]: Secondary | ICD-10-CM

## 2023-01-16 DIAGNOSIS — C50211 Malignant neoplasm of upper-inner quadrant of right female breast: Secondary | ICD-10-CM | POA: Diagnosis not present

## 2023-01-16 DIAGNOSIS — Z853 Personal history of malignant neoplasm of breast: Secondary | ICD-10-CM | POA: Insufficient documentation

## 2023-01-16 DIAGNOSIS — D472 Monoclonal gammopathy: Secondary | ICD-10-CM | POA: Diagnosis not present

## 2023-01-16 DIAGNOSIS — Z9221 Personal history of antineoplastic chemotherapy: Secondary | ICD-10-CM | POA: Insufficient documentation

## 2023-01-16 DIAGNOSIS — Z923 Personal history of irradiation: Secondary | ICD-10-CM | POA: Insufficient documentation

## 2023-01-16 LAB — CMP (CANCER CENTER ONLY)
ALT: 10 U/L (ref 0–44)
AST: 13 U/L — ABNORMAL LOW (ref 15–41)
Albumin: 4.3 g/dL (ref 3.5–5.0)
Alkaline Phosphatase: 65 U/L (ref 38–126)
Anion gap: 10 (ref 5–15)
BUN: 20 mg/dL (ref 8–23)
CO2: 28 mmol/L (ref 22–32)
Calcium: 10.2 mg/dL (ref 8.9–10.3)
Chloride: 102 mmol/L (ref 98–111)
Creatinine: 0.94 mg/dL (ref 0.44–1.00)
GFR, Estimated: >60 mL/min (ref >60)
Glucose, Bld: 137 mg/dL — ABNORMAL HIGH (ref 70–99)
Potassium: 4.1 mmol/L (ref 3.5–5.1)
Sodium: 140 mmol/L (ref 135–145)
Total Bilirubin: 0.9 mg/dL (ref 0.3–1.2)
Total Protein: 7.1 g/dL (ref 6.5–8.1)

## 2023-01-16 LAB — CBC WITH DIFFERENTIAL (CANCER CENTER ONLY)
Abs Immature Granulocytes: 0.03 10*3/uL (ref 0.00–0.07)
Basophils Absolute: 0 10*3/uL (ref 0.0–0.1)
Basophils Relative: 0 %
Eosinophils Absolute: 0.1 10*3/uL (ref 0.0–0.5)
Eosinophils Relative: 1 %
HCT: 41.2 % (ref 36.0–46.0)
Hemoglobin: 13.3 g/dL (ref 12.0–15.0)
Immature Granulocytes: 0 %
Lymphocytes Relative: 12 %
Lymphs Abs: 1.2 10*3/uL (ref 0.7–4.0)
MCH: 26.5 pg (ref 26.0–34.0)
MCHC: 32.3 g/dL (ref 30.0–36.0)
MCV: 82.2 fL (ref 80.0–100.0)
Monocytes Absolute: 1.1 10*3/uL — ABNORMAL HIGH (ref 0.1–1.0)
Monocytes Relative: 11 %
Neutro Abs: 7.9 10*3/uL — ABNORMAL HIGH (ref 1.7–7.7)
Neutrophils Relative %: 76 %
Platelet Count: 380 10*3/uL (ref 150–400)
RBC: 5.01 MIL/uL (ref 3.87–5.11)
RDW: 13.2 % (ref 11.5–15.5)
WBC Count: 10.4 10*3/uL (ref 4.0–10.5)
nRBC: 0 % (ref 0.0–0.2)

## 2023-01-16 LAB — LACTATE DEHYDROGENASE: LDH: 146 U/L (ref 98–192)

## 2023-01-16 NOTE — Progress Notes (Signed)
Referral MD  Reason for Referral: IgG Lambda MGUS                                  Stage IIA (T1cN1M0) infiltrating duct carcinoma of the right breast -- ER-/PR-/HER2+  Chief Complaint  Patient presents with   New Patient (Initial Visit)    "I have some unusual numbers on my lab work."  : I was told that I had a abnormal protein.  HPI: Terri Mayer is a very nice 72 year old postmenopausal woman.  She has been followed by the iconic Dr. Jana Hakim.  She presented back in December with a stage IIa ductal carcinoma of the right breast.  This was ER-/HER2+.  She underwent a lumpectomy.  She had initial neoadjuvant chemotherapy with carboplatin/Taxotere/Herceptin/Perjeta.  She had 6 cycles.  She did develop some neuropathy from the docetaxel and was switched over to Gemzar.  After her lumpectomy in June 2017, she then underwent adjuvant Herceptin for a year.  Last dose was January 2018.  Patient then received radiation therapy.  She completed this in August 2017.  She was last seen by Dr. Jana Hakim 2022.  She apparently had a fall recently.  She fractured her right hip.  She had surgery for this.  This was done on 11/26/2022.  She has surgery without difficulty.  There is no indication that there is any malignancy from what I can tell.  She is being followed by Dr. Raeford Razor of orthopedics/rehab.  He did some studies on her.  She was found to have a monoclonal spike of 0.3 g/dL.  This was found to be Ig G lambda spike.  On 01/01/2023, she had normal immunoglobulin levels.  Unfortunately, there is no light chains that were sent off.  Because of this monoclonal spike, he referred her to the [facility] for an evaluation.  She has had no problem with infections.  She has had no problems with weight loss or weight gain.  Her appetite is pretty good.  There is been no change in bowel or bladder habits.  Currently, I would say performance status is probably ECOG 1.    Past Medical History:   Diagnosis Date   Allergy    Breast cancer ([place])    History of chicken pox    Osteoarthritis    Neck,Back,Knees,Hips,Ankles   Personal history of chemotherapy    Personal history of radiation therapy   :   Past Surgical History:  Procedure Laterality Date   BREAST LUMPECTOMY Right 2017   DILATION AND CURETTAGE OF UTERUS     EYE SURGERY  at age 35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   INTRAMEDULLARY (IM) NAIL INTERTROCHANTERIC Right 11/26/2022   Procedure: INTRAMEDULLARY (IM) NAIL INTERTROCHANTERIC;  Surgeon: Altamese , MD;  Location: [facility];  Service: Orthopedics;  Laterality: Right;   PORTACATH PLACEMENT Right 12/11/2015   Procedure: INSERTION PORT-A-CATH WITH ULTRASOUND;  Surgeon: Rolm Bookbinder, MD;  Location: [facility];  Service: General;  Laterality: Right;   RADIOACTIVE SEED GUIDED PARTIAL MASTECTOMY/AXILLARY SENTINEL NODE BIOPSY/AXILLARY NODE DISSECTION Right 05/02/2016   Procedure: RIGHT BREAST SEED GUIDED LUMPECTOMY AND RIGHT SEED TARGETED AXILLARY DISSECTION,AND SENTINEL NODE BIOPSY;  Surgeon: Rolm Bookbinder, MD;  Location: [facility];  Service: General;  Laterality: Right;  RIGHT BREAST SEED GUIDED LUMPECTOMY AND RIGHT SEED TARGETED AXILLARY DISSECTION,AND SENTINEL NODE BIOPSY   WISDOM TOOTH EXTRACTION    :   Current  Outpatient Medications:    acetaminophen (TYLENOL) 325 MG tablet, Take 2 tablets (650 mg total) by mouth every 4 (four) hours., Disp: , Rfl:    ascorbic acid (VITAMIN C) 1000 MG tablet, Take 1 tablet (1,000 mg total) by mouth daily., Disp: 30 tablet, Rfl: 0   diclofenac Sodium (VOLTAREN) 1 % GEL, Apply 4 g topically 4 (four) times daily., Disp: 400 g, Rfl: 0   methocarbamol (ROBAXIN) 500 MG tablet, Take 1 tablet (500 mg total) by mouth every 6 (six) hours as needed for muscle spasms., Disp: 60 tablet, Rfl: 0   Multiple Vitamin (MULTIVITAMIN) tablet, Take 1 tablet by mouth daily. (Patient not taking: Reported on 01/16/2023), Disp: , Rfl:     oxyCODONE HCl (OXYCONTIN PO), Take by mouth. 01/16/2023 Received in rehab for hip, does not usually often. Thinks strength is 5 mg. (Patient not taking: Reported on 01/16/2023), Disp: , Rfl:    vitamin D3 (CHOLECALCIFEROL) 25 MCG tablet, Take 2 tablets (2,000 Units total) by mouth 2 (two) times daily. (Patient not taking: Reported on 01/16/2023), Disp: 60 tablet, Rfl: 0:  :   Allergies  Allergen Reactions   Codeine Nausea And Vomiting    dizziness  :   Family History  Problem Relation Age of Onset   Heart disease Father    Diabetes Other        GM   Dementia Other        M   Transient ischemic attack Other        F   Charcot-Marie-Tooth disease Brother    Colon cancer Neg Hx    Breast cancer Neg Hx   :   Social History   Socioeconomic History   Marital status: Single    Spouse name: Not on file   Number of children: 1   Years of education: Not on file   Highest education level: Not on file  Occupational History   Occupation: dentist  Tobacco Use   Smoking status: Never   Smokeless tobacco: Never  Vaping Use   Vaping Use: Never used  Substance and Sexual Activity   Alcohol use: Not Currently    Comment: social   Drug use: Not Currently   Sexual activity: Not Currently    Birth control/protection: Post-menopausal    Comment: >24 yo; > 5 partners  Other Topics Concern   Not on file  Social History Narrative   Single, adopted 1 chil   Social Determinants of Health   Financial Resource Strain: Not on file  Food Insecurity: No Food Insecurity (11/27/2022)   Hunger Vital Sign    Worried About Running Out of Food in the Last Year: Never true    Ran Out of Food in the Last Year: Never true  Transportation Needs: No Transportation Needs (11/27/2022)   PRAPARE - Hydrologist (Medical): No    Lack of Transportation (Non-Medical): No  Physical Activity: Not on file  Stress: Not on file  Social Connections: Not on file  Intimate  Partner Violence: Not At Risk (11/27/2022)   Humiliation, Afraid, Rape, and Kick questionnaire    Fear of Current or Ex-Partner: No    Emotionally Abused: No    Physically Abused: No    Sexually Abused: No  :  Review of Systems  Constitutional: Negative.   HENT: Negative.    Eyes: Negative.   Respiratory: Negative.    Cardiovascular: Negative.   Gastrointestinal: Negative.   Genitourinary: Negative.   Musculoskeletal:  Positive  for joint pain.  Skin: Negative.   Neurological: Negative.   Endo/Heme/Allergies: Negative.   Psychiatric/Behavioral: Negative.       Exam: Vital signs show temperature of 99.4.  Pulse 117.  Blood pressure 112/65.  Weight is 218 pounds.  '@IPVITALS'$ @ Physical Exam Vitals reviewed.  Constitutional:      Comments: Her breast exam shows the left breast with no masses, edema or erythema.  There is no left axillary adenopathy.  Her right breast shows the lumpectomy at about the 6 o'clock position.  This is well-healed.  She may have a little bit contraction of the right breast from surgery and radiation.  No distinct mass noted in the right breast.  There is no right axillary adenopathy.  HENT:     Head: Normocephalic and atraumatic.  Eyes:     Pupils: Pupils are equal, round, and reactive to light.  Cardiovascular:     Rate and Rhythm: Normal rate and regular rhythm.     Heart sounds: Normal heart sounds.  Pulmonary:     Effort: Pulmonary effort is normal.     Breath sounds: Normal breath sounds.  Abdominal:     General: Bowel sounds are normal.     Palpations: Abdomen is soft.  Musculoskeletal:        General: No tenderness or deformity. Normal range of motion.     Cervical back: Normal range of motion.     Comments: She has decreased range of motion of the right hip from her recent surgery.  Lymphadenopathy:     Cervical: No cervical adenopathy.  Skin:    General: Skin is warm and dry.     Findings: No erythema or rash.  Neurological:      Mental Status: She is alert and oriented to person, place, and time.  Psychiatric:        Behavior: Behavior normal.        Thought Content: Thought content normal.        Judgment: Judgment normal.     Recent Labs    01/16/23 1356  WBC 10.4  HGB 13.3  HCT 41.2  PLT 380    Recent Labs    01/16/23 1356  NA 140  K 4.1  CL 102  CO2 28  GLUCOSE 137*  BUN 20  CREATININE 0.94  CALCIUM 10.2    Blood smear review: Normochromic and normocytic population of red blood cells.  There is no rouleaux formation.  There is no nucleated red blood cells.  I see no teardrop cells.  She has no schistocytes or spherocytes.  White blood cells appear normal in morphology and maturation.  There is no immature myeloid or lymphoid forms.  Platelets are adequate in number and size.  Pathology: None    Assessment and Plan: Ms. Gally is a very charming 72 year old white female.  She has a minimal monoclonal gammopathy.  I would have to believe that this is going to be reactive.  We are doing a 24-hour urine on her.  This will certainly help [place] out to see if there are any light chains in her urine that might be monoclonal.  I think for right now, we can hold off on doing x-rays on her.  I really do not see that this is anything related to her past breast cancer.  Again, there is nothing in the surgical operative report that shows that this fracture was pathologic a CT scan that she had done on 11/25/2022 does not seem to suggest that  there is any evidence of malignancy in the right hip area.  For right now, we can just follow her.  We will do the 24-hour urine shows.  We will plan to get her back once we have the results back from her 24-hour urine.  I just do not see the need to be invasive with her.  She is incredibly charming to talk to.  I know that she really had a great relationship with Dr. Jana Hakim.  Again, I am not surprised by this since he was incredibly compassionate and so thorough with  his patients.

## 2023-01-17 LAB — KAPPA/LAMBDA LIGHT CHAINS
Kappa free light chain: 26.2 mg/L — ABNORMAL HIGH (ref 3.3–19.4)
Kappa, lambda light chain ratio: 1.38 (ref 0.26–1.65)
Lambda free light chains: 19 mg/L (ref 5.7–26.3)

## 2023-01-17 LAB — IGG, IGA, IGM
IgA: 245 mg/dL (ref 64–422)
IgG (Immunoglobin G), Serum: 1021 mg/dL (ref 586–1602)
IgM (Immunoglobulin M), Srm: 71 mg/dL (ref 26–217)

## 2023-01-17 LAB — CANCER ANTIGEN 27.29: CA 27.29: 31.1 U/mL (ref 0.0–38.6)

## 2023-01-20 ENCOUNTER — Inpatient Hospital Stay: Payer: PPO | Attending: Hematology & Oncology

## 2023-01-20 DIAGNOSIS — D472 Monoclonal gammopathy: Secondary | ICD-10-CM | POA: Insufficient documentation

## 2023-01-20 DIAGNOSIS — Z171 Estrogen receptor negative status [ER-]: Secondary | ICD-10-CM

## 2023-01-21 ENCOUNTER — Ambulatory Visit: Payer: PPO | Admitting: Podiatry

## 2023-01-21 ENCOUNTER — Telehealth: Payer: Self-pay | Admitting: Internal Medicine

## 2023-01-21 DIAGNOSIS — S72141D Displaced intertrochanteric fracture of right femur, subsequent encounter for closed fracture with routine healing: Secondary | ICD-10-CM | POA: Diagnosis not present

## 2023-01-21 DIAGNOSIS — M1389 Other specified arthritis, multiple sites: Secondary | ICD-10-CM | POA: Diagnosis not present

## 2023-01-21 DIAGNOSIS — Z923 Personal history of irradiation: Secondary | ICD-10-CM | POA: Diagnosis not present

## 2023-01-21 DIAGNOSIS — Z6839 Body mass index (BMI) 39.0-39.9, adult: Secondary | ICD-10-CM | POA: Diagnosis not present

## 2023-01-21 DIAGNOSIS — Z7901 Long term (current) use of anticoagulants: Secondary | ICD-10-CM | POA: Diagnosis not present

## 2023-01-21 DIAGNOSIS — Z853 Personal history of malignant neoplasm of breast: Secondary | ICD-10-CM | POA: Diagnosis not present

## 2023-01-21 DIAGNOSIS — K5900 Constipation, unspecified: Secondary | ICD-10-CM | POA: Diagnosis not present

## 2023-01-21 DIAGNOSIS — W19XXXD Unspecified fall, subsequent encounter: Secondary | ICD-10-CM | POA: Diagnosis not present

## 2023-01-21 DIAGNOSIS — Z9221 Personal history of antineoplastic chemotherapy: Secondary | ICD-10-CM | POA: Diagnosis not present

## 2023-01-21 NOTE — Telephone Encounter (Signed)
We do not have F2F. They can contact orthopedic for orders. Would need visit in person or virtual with [place] prior to orders

## 2023-01-21 NOTE — Telephone Encounter (Signed)
[place]:  [facility]   Phone Number:  [phone]   Needs Verbal orders for what service & frequency:  Physical Therapy 1 time a week for 4 weeks.

## 2023-01-22 DIAGNOSIS — S72301D Unspecified fracture of shaft of right femur, subsequent encounter for closed fracture with routine healing: Secondary | ICD-10-CM | POA: Diagnosis not present

## 2023-01-22 LAB — UPEP/UIFE/LIGHT CHAINS/TP, 24-HR UR
% BETA, Urine: 0 %
ALPHA 1 URINE: 0 %
Albumin, U: 100 %
Alpha 2, Urine: 0 %
Free Kappa Lt Chains,Ur: 28.98 mg/L (ref 1.17–86.46)
Free Kappa/Lambda Ratio: 9.63 (ref 1.83–14.26)
Free Lambda Lt Chains,Ur: 3.01 mg/L (ref 0.27–15.21)
GAMMA GLOBULIN URINE: 0 %
Total Protein, Urine-Ur/day: 120 mg/24 hr (ref 30–150)
Total Protein, Urine: 4.6 mg/dL
Total Volume: 2600

## 2023-01-22 NOTE — Telephone Encounter (Signed)
Notified Betsy w/MD response. She states she got the order from her orthopedic MD../lmb

## 2023-01-23 ENCOUNTER — Telehealth: Payer: Self-pay | Admitting: *Deleted

## 2023-01-23 ENCOUNTER — Ambulatory Visit: Payer: PPO | Admitting: Podiatry

## 2023-01-23 NOTE — Telephone Encounter (Signed)
-----   Message from Volanda Napoleon, MD sent at 01/23/2023  6:56 AM EST ----- Call and let her know that the urine test does not show any abnormal protein.  This is great.  Laurey Arrow

## 2023-01-23 NOTE — Telephone Encounter (Signed)
As noted below by Dr. Marin Olp, I informed the patient that the urine test does not show any abnormal protein. She verbalized understanding.

## 2023-01-24 DIAGNOSIS — R0602 Shortness of breath: Secondary | ICD-10-CM | POA: Diagnosis not present

## 2023-01-24 DIAGNOSIS — I469 Cardiac arrest, cause unspecified: Secondary | ICD-10-CM | POA: Diagnosis not present

## 2023-01-24 DIAGNOSIS — R55 Syncope and collapse: Secondary | ICD-10-CM | POA: Diagnosis not present

## 2023-01-24 DIAGNOSIS — R404 Transient alteration of awareness: Secondary | ICD-10-CM | POA: Diagnosis not present

## 2023-01-24 DIAGNOSIS — I499 Cardiac arrhythmia, unspecified: Secondary | ICD-10-CM | POA: Diagnosis not present

## 2023-01-24 LAB — PROTEIN ELECTROPHORESIS, SERUM, WITH REFLEX
A/G Ratio: 1 (ref 0.7–1.7)
Albumin ELP: 3.3 g/dL (ref 2.9–4.4)
Alpha-1-Globulin: 0.4 g/dL (ref 0.0–0.4)
Alpha-2-Globulin: 0.8 g/dL (ref 0.4–1.0)
Beta Globulin: 1.1 g/dL (ref 0.7–1.3)
Gamma Globulin: 1.1 g/dL (ref 0.4–1.8)
Globulin, Total: 3.4 g/dL (ref 2.2–3.9)
M-Spike, %: 0.3 g/dL — ABNORMAL HIGH
SPEP Interpretation: 0
Total Protein ELP: 6.7 g/dL (ref 6.0–8.5)

## 2023-01-24 LAB — IMMUNOFIXATION REFLEX, SERUM
IgA: 291 mg/dL (ref 64–422)
IgG (Immunoglobin G), Serum: 1221 mg/dL (ref 586–1602)
IgM (Immunoglobulin M), Srm: 78 mg/dL (ref 26–217)

## 2023-01-25 ENCOUNTER — Telehealth: Payer: Self-pay

## 2023-01-25 NOTE — Telephone Encounter (Signed)
[facility] VOB initiated via [email or web address]  New start    SUBJECTIVE:  Including CC & ROS.  No chief complaint on file.     Terri Mayer is a 72 y.o. female that is presenting with osteoporosis and right hip fracture.  She fell from standing height and had a trochanteric right hip fracture.  No history of tobacco use.  No significant family history of osteoporosis.  She does have a history significant for breast cancer and chemoradiation.  Currently is having home health physical therapy.  No long-term prednisone use.   Review of the discharge note from 1/26 shows she has a history of breast cancer status postlumpectomy with a partial right mastectomy with chemoradiation therapy and polyneuropathy.  She was admitted and underwent intramedullary nailing of the right hip on 1/9. Independent review of the bone density scan from 2021 shows a T-score of -1.5. Independent review of the CT pelvis without contrast on 1/8 shows a acute comminuted right femoral intratrochanteric fracture.   ASSESSMENT & PLAN:    Age-related osteoporosis with current pathological fracture Acutely occurring with most recent pathologic hip fracture.  She does have a history of breast cancer with subsequent treatment. -Counseled on home exercise therapy and supportive care. -Magnesium, phosphorus, SPEP and IFE, PTH and calcium. -Bone density. -Pursue [facility] given her history of recent hip fracture

## 2023-01-25 NOTE — Telephone Encounter (Signed)
From: Rosemarie Ax, MD  Sent: 01/01/2023   2:32 PM EST  To: Jasper Loser, CMA   Pursue evenity

## 2023-01-29 NOTE — Telephone Encounter (Signed)
Pt ready for scheduling on or after 01/29/23  Out-of-pocket cost due at time of visit: $327  Primary: [employer] [place] co-insurance: 20% (approximately $302) Admin fee co-insurance: 20% (approximately $25)  Deductible: does not apply  Prior Auth: NOT required  Secondary: n/a [place] co-insurance:  Admin fee co-insurance:   Deductible: n/a  Prior Auth: n/a PA# Valid:   ** This summary of benefits is an estimation of the patient's out-of-pocket cost. Exact cost may vary based on individual plan coverage.

## 2023-01-30 ENCOUNTER — Telehealth: Payer: Self-pay

## 2023-01-30 NOTE — Telephone Encounter (Signed)
From: Rosemarie Ax, MD  Sent: 01/01/2023   2:32 PM EST  To: Jasper Loser, CMA   Pursue evenity

## 2023-02-01 NOTE — Telephone Encounter (Signed)
Undisclosed - [employer]

## 2023-02-04 ENCOUNTER — Telehealth: Payer: Self-pay | Admitting: *Deleted

## 2023-02-04 NOTE — Telephone Encounter (Signed)
Per staff message Terri Mayer - called patient and lvm of upcoming appointments - requested call back to confirm

## 2023-02-07 NOTE — Telephone Encounter (Signed)
Left message for patient to call back  

## 2023-02-12 NOTE — Telephone Encounter (Signed)
Pt ready for scheduling on or after 02/12/23  Out-of-pocket cost due at time of visit: $475  Primary: [employer] Adv [facility] co-insurance: 20% (approximately $450) Admin fee co-insurance: 20% (approximately $25)  Deductible: does not apply  Prior Auth: NOT required PA# Valid:   Secondary: n/a [facility] co-insurance:  Admin fee co-insurance:   Deductible: n/a  Prior Auth: n/a PA# Valid:   ** This summary of benefits is an estimation of the patient's out-of-pocket cost. Exact cost may vary based on individual plan coverage.

## 2023-02-13 NOTE — Telephone Encounter (Signed)
Left message for patient to call back  

## 2023-02-17 DEATH — deceased

## 2023-02-19 NOTE — Telephone Encounter (Signed)
Left message for patient to call back to advise of below and schedule nurse visit to start [facility].

## 2023-03-03 ENCOUNTER — Encounter: Payer: Self-pay | Admitting: *Deleted

## 2023-06-04 ENCOUNTER — Other Ambulatory Visit: Payer: PPO

## 2023-08-07 ENCOUNTER — Other Ambulatory Visit: Payer: PPO

## 2023-08-07 ENCOUNTER — Ambulatory Visit: Payer: PPO | Admitting: Hematology & Oncology

## 2023-09-25 ENCOUNTER — Ambulatory Visit: Payer: PPO | Admitting: Obstetrics & Gynecology
